# Patient Record
Sex: Female | Born: 1935 | Race: White | Hispanic: No | Marital: Married | Smoking: Never smoker
Health system: Southern US, Community
[De-identification: ages and names within clinical notes are randomized; demographics above are authoritative.]

## PROBLEM LIST (undated history)

## (undated) DIAGNOSIS — D649 Anemia, unspecified: Secondary | ICD-10-CM

## (undated) DIAGNOSIS — N1330 Unspecified hydronephrosis: Secondary | ICD-10-CM

## (undated) DIAGNOSIS — E119 Type 2 diabetes mellitus without complications: Secondary | ICD-10-CM

## (undated) DIAGNOSIS — K219 Gastro-esophageal reflux disease without esophagitis: Secondary | ICD-10-CM

## (undated) DIAGNOSIS — I10 Essential (primary) hypertension: Secondary | ICD-10-CM

## (undated) DIAGNOSIS — M1990 Unspecified osteoarthritis, unspecified site: Secondary | ICD-10-CM

## (undated) DIAGNOSIS — I5032 Chronic diastolic (congestive) heart failure: Secondary | ICD-10-CM

## (undated) DIAGNOSIS — N390 Urinary tract infection, site not specified: Secondary | ICD-10-CM

## (undated) DIAGNOSIS — I4891 Unspecified atrial fibrillation: Secondary | ICD-10-CM

## (undated) DIAGNOSIS — H3530 Unspecified macular degeneration: Secondary | ICD-10-CM

## (undated) DIAGNOSIS — L89109 Pressure ulcer of unspecified part of back, unspecified stage: Secondary | ICD-10-CM

## (undated) DIAGNOSIS — G4700 Insomnia, unspecified: Secondary | ICD-10-CM

## (undated) DIAGNOSIS — R609 Edema, unspecified: Secondary | ICD-10-CM

## (undated) DIAGNOSIS — N183 Chronic kidney disease, stage 3 unspecified: Secondary | ICD-10-CM

## (undated) DIAGNOSIS — F32A Depression, unspecified: Secondary | ICD-10-CM

## (undated) DIAGNOSIS — F329 Major depressive disorder, single episode, unspecified: Secondary | ICD-10-CM

## (undated) HISTORY — PX: COLONOSCOPY: SHX174

## (undated) HISTORY — DX: Unspecified osteoarthritis, unspecified site: M19.90

## (undated) HISTORY — PX: APPENDECTOMY: SHX54

## (undated) HISTORY — DX: Anemia, unspecified: D64.9

## (undated) HISTORY — DX: Edema, unspecified: R60.9

## (undated) HISTORY — DX: Unspecified hydronephrosis: N13.30

## (undated) HISTORY — PX: UPPER GASTROINTESTINAL ENDOSCOPY: SHX188

## (undated) HISTORY — PX: CHOLECYSTECTOMY: SHX55

## (undated) HISTORY — DX: Unspecified atrial fibrillation: I48.91

## (undated) HISTORY — DX: Unspecified macular degeneration: H35.30

## (undated) HISTORY — DX: Essential (primary) hypertension: I10

## (undated) HISTORY — DX: Type 2 diabetes mellitus without complications: E11.9

---

## 1998-01-21 ENCOUNTER — Other Ambulatory Visit: Admission: RE | Admit: 1998-01-21 | Discharge: 1998-01-21 | Payer: Self-pay | Admitting: Obstetrics and Gynecology

## 2001-02-13 ENCOUNTER — Ambulatory Visit (HOSPITAL_COMMUNITY): Admission: RE | Admit: 2001-02-13 | Discharge: 2001-02-13 | Payer: Self-pay | Admitting: Cardiovascular Disease

## 2001-02-14 ENCOUNTER — Encounter: Payer: Self-pay | Admitting: Cardiovascular Disease

## 2003-07-19 ENCOUNTER — Other Ambulatory Visit: Admission: RE | Admit: 2003-07-19 | Discharge: 2003-07-19 | Payer: Self-pay | Admitting: Obstetrics and Gynecology

## 2003-12-01 ENCOUNTER — Emergency Department (HOSPITAL_COMMUNITY): Admission: EM | Admit: 2003-12-01 | Discharge: 2003-12-02 | Payer: Self-pay | Admitting: Emergency Medicine

## 2004-01-10 ENCOUNTER — Ambulatory Visit: Payer: Self-pay | Admitting: Cardiovascular Disease

## 2004-01-31 ENCOUNTER — Ambulatory Visit: Payer: Self-pay | Admitting: Internal Medicine

## 2004-02-27 ENCOUNTER — Ambulatory Visit: Payer: Self-pay | Admitting: Cardiology

## 2004-03-27 ENCOUNTER — Ambulatory Visit: Payer: Self-pay | Admitting: Cardiovascular Disease

## 2004-04-24 ENCOUNTER — Ambulatory Visit: Payer: Self-pay | Admitting: Internal Medicine

## 2004-05-29 ENCOUNTER — Ambulatory Visit: Payer: Self-pay | Admitting: Internal Medicine

## 2004-06-26 ENCOUNTER — Ambulatory Visit: Payer: Self-pay | Admitting: Cardiology

## 2004-07-24 ENCOUNTER — Ambulatory Visit: Payer: Self-pay | Admitting: Cardiovascular Disease

## 2004-08-21 ENCOUNTER — Ambulatory Visit: Payer: Self-pay | Admitting: Cardiology

## 2004-09-18 ENCOUNTER — Ambulatory Visit: Payer: Self-pay | Admitting: Cardiology

## 2004-10-16 ENCOUNTER — Ambulatory Visit: Payer: Self-pay | Admitting: Cardiology

## 2004-10-26 ENCOUNTER — Other Ambulatory Visit: Admission: RE | Admit: 2004-10-26 | Discharge: 2004-10-26 | Payer: Self-pay | Admitting: Obstetrics and Gynecology

## 2004-11-06 ENCOUNTER — Ambulatory Visit (HOSPITAL_COMMUNITY): Admission: RE | Admit: 2004-11-06 | Discharge: 2004-11-06 | Payer: Self-pay | Admitting: Obstetrics and Gynecology

## 2004-11-13 ENCOUNTER — Ambulatory Visit: Payer: Self-pay | Admitting: Internal Medicine

## 2004-11-27 ENCOUNTER — Ambulatory Visit: Payer: Self-pay | Admitting: Cardiology

## 2004-12-25 ENCOUNTER — Ambulatory Visit: Payer: Self-pay | Admitting: Cardiovascular Disease

## 2005-01-22 ENCOUNTER — Ambulatory Visit: Payer: Self-pay | Admitting: Cardiology

## 2005-02-12 ENCOUNTER — Ambulatory Visit: Payer: Self-pay | Admitting: Cardiology

## 2005-03-12 ENCOUNTER — Ambulatory Visit: Payer: Self-pay | Admitting: Cardiology

## 2005-04-09 ENCOUNTER — Ambulatory Visit: Payer: Self-pay | Admitting: Cardiology

## 2005-05-07 ENCOUNTER — Ambulatory Visit: Payer: Self-pay | Admitting: Internal Medicine

## 2005-06-04 ENCOUNTER — Ambulatory Visit: Payer: Self-pay | Admitting: Cardiovascular Disease

## 2005-06-18 ENCOUNTER — Ambulatory Visit: Payer: Self-pay | Admitting: Cardiology

## 2005-07-09 ENCOUNTER — Ambulatory Visit: Payer: Self-pay | Admitting: Cardiology

## 2005-08-06 ENCOUNTER — Ambulatory Visit: Payer: Self-pay | Admitting: Cardiology

## 2005-09-03 ENCOUNTER — Ambulatory Visit: Payer: Self-pay | Admitting: Cardiology

## 2005-10-01 ENCOUNTER — Ambulatory Visit: Payer: Self-pay | Admitting: Cardiology

## 2005-10-29 ENCOUNTER — Ambulatory Visit: Payer: Self-pay | Admitting: Cardiovascular Disease

## 2005-11-26 ENCOUNTER — Ambulatory Visit: Payer: Self-pay | Admitting: Cardiovascular Disease

## 2005-12-24 ENCOUNTER — Ambulatory Visit: Payer: Self-pay | Admitting: Cardiology

## 2006-01-14 ENCOUNTER — Ambulatory Visit: Payer: Self-pay | Admitting: Cardiology

## 2006-02-04 ENCOUNTER — Ambulatory Visit: Payer: Self-pay | Admitting: Cardiology

## 2006-02-25 ENCOUNTER — Ambulatory Visit: Payer: Self-pay | Admitting: Cardiovascular Disease

## 2006-03-25 ENCOUNTER — Ambulatory Visit: Payer: Self-pay | Admitting: Cardiovascular Disease

## 2006-04-08 ENCOUNTER — Ambulatory Visit: Payer: Self-pay | Admitting: Internal Medicine

## 2006-04-29 ENCOUNTER — Ambulatory Visit: Payer: Self-pay | Admitting: Internal Medicine

## 2006-05-13 ENCOUNTER — Ambulatory Visit: Payer: Self-pay | Admitting: Cardiology

## 2006-05-20 ENCOUNTER — Ambulatory Visit: Payer: Self-pay | Admitting: Cardiovascular Disease

## 2006-05-30 ENCOUNTER — Ambulatory Visit: Payer: Self-pay | Admitting: Cardiology

## 2006-06-10 ENCOUNTER — Ambulatory Visit: Payer: Self-pay | Admitting: Cardiology

## 2006-06-25 ENCOUNTER — Emergency Department (HOSPITAL_COMMUNITY): Admission: EM | Admit: 2006-06-25 | Discharge: 2006-06-25 | Payer: Self-pay | Admitting: Emergency Medicine

## 2006-07-01 ENCOUNTER — Ambulatory Visit: Payer: Self-pay | Admitting: Cardiology

## 2006-07-15 ENCOUNTER — Ambulatory Visit: Payer: Self-pay | Admitting: Cardiology

## 2006-08-12 ENCOUNTER — Ambulatory Visit: Payer: Self-pay | Admitting: Cardiology

## 2006-08-26 ENCOUNTER — Ambulatory Visit: Payer: Self-pay | Admitting: Cardiovascular Disease

## 2006-09-08 ENCOUNTER — Ambulatory Visit: Payer: Self-pay | Admitting: Internal Medicine

## 2006-09-30 ENCOUNTER — Ambulatory Visit: Payer: Self-pay | Admitting: Cardiology

## 2006-10-14 ENCOUNTER — Ambulatory Visit: Payer: Self-pay | Admitting: Cardiology

## 2006-10-28 ENCOUNTER — Ambulatory Visit: Payer: Self-pay | Admitting: Cardiovascular Disease

## 2006-11-11 ENCOUNTER — Ambulatory Visit: Payer: Self-pay | Admitting: Cardiology

## 2006-11-25 ENCOUNTER — Ambulatory Visit: Payer: Self-pay | Admitting: Cardiology

## 2006-12-16 ENCOUNTER — Ambulatory Visit: Payer: Self-pay | Admitting: Cardiology

## 2007-01-20 ENCOUNTER — Ambulatory Visit: Payer: Self-pay | Admitting: Cardiology

## 2007-02-17 ENCOUNTER — Ambulatory Visit: Payer: Self-pay | Admitting: Cardiology

## 2007-02-28 ENCOUNTER — Ambulatory Visit: Payer: Self-pay | Admitting: Cardiology

## 2007-03-24 ENCOUNTER — Ambulatory Visit: Payer: Self-pay | Admitting: Cardiology

## 2007-03-31 ENCOUNTER — Ambulatory Visit: Payer: Self-pay | Admitting: Cardiovascular Disease

## 2007-04-21 ENCOUNTER — Encounter: Payer: Self-pay | Admitting: Cardiovascular Disease

## 2007-04-21 ENCOUNTER — Ambulatory Visit: Payer: Self-pay

## 2007-04-21 ENCOUNTER — Ambulatory Visit: Payer: Self-pay | Admitting: Internal Medicine

## 2007-05-19 ENCOUNTER — Ambulatory Visit: Payer: Self-pay | Admitting: Internal Medicine

## 2007-06-15 ENCOUNTER — Ambulatory Visit: Payer: Self-pay | Admitting: Internal Medicine

## 2007-07-14 ENCOUNTER — Ambulatory Visit: Payer: Self-pay | Admitting: Cardiology

## 2007-08-04 ENCOUNTER — Ambulatory Visit: Payer: Self-pay | Admitting: Cardiology

## 2007-08-07 HISTORY — PX: REPLACEMENT TOTAL KNEE: SUR1224

## 2007-08-16 ENCOUNTER — Inpatient Hospital Stay (HOSPITAL_COMMUNITY): Admission: RE | Admit: 2007-08-16 | Discharge: 2007-08-22 | Payer: Self-pay | Admitting: Orthopedic Surgery

## 2007-09-19 ENCOUNTER — Encounter: Admission: RE | Admit: 2007-09-19 | Discharge: 2007-09-19 | Payer: Self-pay | Admitting: Endocrinology

## 2007-11-16 ENCOUNTER — Ambulatory Visit: Payer: Self-pay | Admitting: Cardiovascular Disease

## 2007-11-24 ENCOUNTER — Ambulatory Visit: Payer: Self-pay | Admitting: Cardiology

## 2007-12-22 ENCOUNTER — Ambulatory Visit: Payer: Self-pay | Admitting: Cardiology

## 2008-01-19 ENCOUNTER — Ambulatory Visit: Payer: Self-pay | Admitting: Internal Medicine

## 2008-01-29 ENCOUNTER — Ambulatory Visit: Payer: Self-pay | Admitting: Internal Medicine

## 2008-02-09 ENCOUNTER — Ambulatory Visit: Payer: Self-pay | Admitting: Internal Medicine

## 2008-02-23 ENCOUNTER — Ambulatory Visit: Payer: Self-pay | Admitting: Cardiovascular Disease

## 2008-02-29 ENCOUNTER — Ambulatory Visit: Payer: Self-pay | Admitting: Internal Medicine

## 2008-03-07 ENCOUNTER — Ambulatory Visit: Payer: Self-pay | Admitting: Internal Medicine

## 2008-03-22 ENCOUNTER — Ambulatory Visit: Payer: Self-pay | Admitting: Cardiology

## 2008-04-05 ENCOUNTER — Ambulatory Visit: Payer: Self-pay | Admitting: Cardiovascular Disease

## 2008-04-15 ENCOUNTER — Inpatient Hospital Stay (HOSPITAL_COMMUNITY): Admission: RE | Admit: 2008-04-15 | Discharge: 2008-04-18 | Payer: Self-pay | Admitting: Orthopedic Surgery

## 2008-04-25 ENCOUNTER — Encounter: Admission: RE | Admit: 2008-04-25 | Discharge: 2008-04-25 | Payer: Self-pay | Admitting: Endocrinology

## 2008-06-12 DIAGNOSIS — I4891 Unspecified atrial fibrillation: Secondary | ICD-10-CM | POA: Insufficient documentation

## 2008-06-12 DIAGNOSIS — I10 Essential (primary) hypertension: Secondary | ICD-10-CM

## 2008-06-12 DIAGNOSIS — H3530 Unspecified macular degeneration: Secondary | ICD-10-CM

## 2008-06-12 DIAGNOSIS — E7800 Pure hypercholesterolemia, unspecified: Secondary | ICD-10-CM

## 2008-06-12 DIAGNOSIS — M1990 Unspecified osteoarthritis, unspecified site: Secondary | ICD-10-CM | POA: Insufficient documentation

## 2008-06-12 DIAGNOSIS — M810 Age-related osteoporosis without current pathological fracture: Secondary | ICD-10-CM | POA: Insufficient documentation

## 2008-06-14 ENCOUNTER — Ambulatory Visit: Payer: Self-pay | Admitting: Cardiovascular Disease

## 2008-06-14 ENCOUNTER — Encounter: Payer: Self-pay | Admitting: Cardiovascular Disease

## 2008-06-14 DIAGNOSIS — R609 Edema, unspecified: Secondary | ICD-10-CM

## 2008-06-21 ENCOUNTER — Ambulatory Visit: Payer: Self-pay | Admitting: Internal Medicine

## 2008-07-05 ENCOUNTER — Ambulatory Visit: Payer: Self-pay | Admitting: Cardiology

## 2008-07-19 ENCOUNTER — Ambulatory Visit: Payer: Self-pay | Admitting: Cardiology

## 2008-07-29 ENCOUNTER — Ambulatory Visit: Payer: Self-pay | Admitting: Cardiovascular Disease

## 2008-08-06 ENCOUNTER — Encounter: Payer: Self-pay | Admitting: *Deleted

## 2008-08-09 ENCOUNTER — Ambulatory Visit: Payer: Self-pay | Admitting: Cardiology

## 2008-08-30 ENCOUNTER — Ambulatory Visit: Payer: Self-pay | Admitting: Cardiology

## 2008-08-30 LAB — CONVERTED CEMR LAB: POC INR: 2.5

## 2008-09-11 ENCOUNTER — Encounter: Payer: Self-pay | Admitting: *Deleted

## 2008-09-27 ENCOUNTER — Ambulatory Visit: Payer: Self-pay | Admitting: Internal Medicine

## 2008-09-27 LAB — CONVERTED CEMR LAB: Prothrombin Time: 21.6 s

## 2008-10-11 ENCOUNTER — Ambulatory Visit: Payer: Self-pay | Admitting: Cardiology

## 2008-10-11 LAB — CONVERTED CEMR LAB
POC INR: 2.2
Prothrombin Time: 18.1 s

## 2008-11-01 ENCOUNTER — Encounter (INDEPENDENT_AMBULATORY_CARE_PROVIDER_SITE_OTHER): Payer: Self-pay | Admitting: Cardiology

## 2008-11-01 ENCOUNTER — Ambulatory Visit: Payer: Self-pay | Admitting: Cardiovascular Disease

## 2008-11-29 ENCOUNTER — Ambulatory Visit: Payer: Self-pay | Admitting: Internal Medicine

## 2008-11-29 LAB — CONVERTED CEMR LAB: POC INR: 5

## 2008-12-06 ENCOUNTER — Ambulatory Visit: Payer: Self-pay | Admitting: Internal Medicine

## 2008-12-20 ENCOUNTER — Ambulatory Visit: Payer: Self-pay | Admitting: Cardiovascular Disease

## 2008-12-20 ENCOUNTER — Ambulatory Visit: Payer: Self-pay | Admitting: Cardiology

## 2008-12-26 ENCOUNTER — Ambulatory Visit: Payer: Self-pay | Admitting: Cardiovascular Disease

## 2008-12-30 ENCOUNTER — Telehealth: Payer: Self-pay | Admitting: Cardiovascular Disease

## 2009-01-10 ENCOUNTER — Ambulatory Visit: Payer: Self-pay | Admitting: Cardiovascular Disease

## 2009-01-10 LAB — CONVERTED CEMR LAB: POC INR: 2.2

## 2009-01-24 ENCOUNTER — Ambulatory Visit: Payer: Self-pay | Admitting: Cardiology

## 2009-01-24 LAB — CONVERTED CEMR LAB: POC INR: 2.9

## 2009-02-21 ENCOUNTER — Ambulatory Visit: Payer: Self-pay | Admitting: Cardiovascular Disease

## 2009-02-21 LAB — CONVERTED CEMR LAB: INR: 2.5

## 2009-03-20 ENCOUNTER — Ambulatory Visit: Payer: Self-pay | Admitting: Internal Medicine

## 2009-03-20 LAB — CONVERTED CEMR LAB: POC INR: 3.3

## 2009-04-18 ENCOUNTER — Ambulatory Visit: Payer: Self-pay | Admitting: Internal Medicine

## 2009-05-16 ENCOUNTER — Ambulatory Visit: Payer: Self-pay | Admitting: Cardiology

## 2009-05-30 ENCOUNTER — Ambulatory Visit: Payer: Self-pay | Admitting: Internal Medicine

## 2009-05-30 LAB — CONVERTED CEMR LAB: POC INR: 2.4

## 2009-06-13 ENCOUNTER — Ambulatory Visit: Payer: Self-pay | Admitting: Cardiology

## 2009-07-04 ENCOUNTER — Ambulatory Visit: Payer: Self-pay | Admitting: Cardiology

## 2009-07-22 ENCOUNTER — Ambulatory Visit: Payer: Self-pay | Admitting: Internal Medicine

## 2009-08-06 ENCOUNTER — Ambulatory Visit: Payer: Self-pay | Admitting: Cardiology

## 2009-08-06 ENCOUNTER — Ambulatory Visit: Payer: Self-pay | Admitting: Cardiovascular Disease

## 2009-08-06 LAB — CONVERTED CEMR LAB: POC INR: 2.8

## 2009-08-27 ENCOUNTER — Ambulatory Visit: Payer: Self-pay | Admitting: Cardiovascular Disease

## 2009-08-27 LAB — CONVERTED CEMR LAB: POC INR: 4.8

## 2009-09-05 ENCOUNTER — Ambulatory Visit: Payer: Self-pay | Admitting: Cardiology

## 2009-09-19 ENCOUNTER — Ambulatory Visit: Payer: Self-pay | Admitting: Cardiology

## 2009-09-19 LAB — CONVERTED CEMR LAB: POC INR: 2.4

## 2009-10-17 ENCOUNTER — Ambulatory Visit: Payer: Self-pay | Admitting: Internal Medicine

## 2009-10-17 LAB — CONVERTED CEMR LAB: POC INR: 2.3

## 2009-11-13 ENCOUNTER — Ambulatory Visit: Payer: Self-pay | Admitting: Internal Medicine

## 2009-12-11 ENCOUNTER — Ambulatory Visit: Payer: Self-pay | Admitting: Internal Medicine

## 2010-01-08 ENCOUNTER — Ambulatory Visit: Payer: Self-pay

## 2010-02-05 ENCOUNTER — Encounter: Payer: Self-pay | Admitting: Cardiovascular Disease

## 2010-02-05 ENCOUNTER — Ambulatory Visit: Payer: Self-pay | Admitting: Cardiovascular Disease

## 2010-03-05 ENCOUNTER — Ambulatory Visit: Payer: Self-pay | Admitting: Cardiology

## 2010-04-02 ENCOUNTER — Ambulatory Visit: Admission: RE | Admit: 2010-04-02 | Discharge: 2010-04-02 | Payer: Self-pay | Source: Home / Self Care

## 2010-04-07 NOTE — Medication Information (Signed)
Summary: rov/tm   Anticoagulant Therapy  Managed by: Bethena Midget, RN, BSN Referring MD: Charlton Haws MD Supervising MD: Ladona Ridgel MD,Gregg Indication 1: Atrial Fibrillation (ICD-427.31) Lab Used: [facility]  Site: [facility] INR POC 2.3 INR RANGE 2 - 3  Dietary changes: no    Health status changes: no    Bleeding/hemorrhagic complications: no    Recent/future hospitalizations: no    Any changes in medication regimen? no    Recent/future dental: no  Any missed doses?: no       Is patient compliant with meds? yes       Allergies: No Known Drug Allergies  Anticoagulation Management History:      The patient is taking warfarin and comes in today for a routine follow up visit.  Positive risk factors for bleeding include an age of 48 years or older and presence of serious comorbidities.  The bleeding index is 'intermediate risk'.  Positive CHADS2 values include History of HTN and History of Diabetes.  Negative CHADS2 values include Age > 51 years old.  The start date was 01/06/2001.  Her last INR was 2.5.  Anticoagulation responsible provider: Ladona Ridgel MD,Gregg.  INR POC: 2.3.  Cuvette Lot#: [id number].  Exp: 12/2010.    Anticoagulation Management Assessment/Plan:      The patient's current anticoagulation dose is Coumadin 3 mg tabs: Take as directed by coumadin clinic..  The target INR is 2 - 3.  The next INR is due 12/11/2009.  Anticoagulation instructions were given to patient.  Results were reviewed/authorized by Bethena Midget, RN, BSN.         Prior Anticoagulation Instructions: INR 2.3 Continue 3mg s everyday. Recheck in 4 weeks.   Current Anticoagulation Instructions: INR 2.3  Continue taking 1 tablet everyday. No changes today. See you in 4 weeks.

## 2010-04-07 NOTE — Medication Information (Signed)
Summary: rov/tm  Anticoagulant Therapy  Managed by: Weston Brass, PharmD Referring MD: Charlton Haws MD Supervising MD: Eden Emms MD, Theron Arista Indication 1: Atrial Fibrillation (ICD-427.31) Lab Used: [facility] [place] Site: [facility] INR POC 4.8 INR RANGE 2 - 3  Dietary changes: no    Health status changes: no    Bleeding/hemorrhagic complications: no    Recent/future hospitalizations: no    Any changes in medication regimen? no    Recent/future dental: no  Any missed doses?: no       Is patient compliant with meds? yes       Allergies: No Known Drug Allergies  Anticoagulation Management History:      The patient is taking warfarin and comes in today for a routine follow up visit.  Positive risk factors for bleeding include an age of 75 years or older and presence of serious comorbidities.  The bleeding index is 'intermediate risk'.  Positive CHADS2 values include History of HTN and History of Diabetes.  Negative CHADS2 values include Age > 75 years old.  The start date was 01/06/2001.  Her last INR was 2.5.  Anticoagulation responsible provider: Eden Emms MD, Theron Arista.  INR POC: 4.8.  Cuvette Lot#: [id number].  Exp: 11/2010.    Anticoagulation Management Assessment/Plan:      The patient's current anticoagulation dose is Coumadin 3 mg tabs: Take as directed by coumadin clinic..  The target INR is 2 - 3.  The next INR is due 09/05/2009.  Anticoagulation instructions were given to patient.  Results were reviewed/authorized by Weston Brass, PharmD.  She was notified by Weston Brass PharmD.         Prior Anticoagulation Instructions: INR 2.8 Continue 3mg s daily except 6mg s on Mondays. Recheck in 3 weeks.   Current Anticoagulation Instructions: INR 4.8  Skip Coumadin today and tomorrow then decrease dose to 1 tablet every day.

## 2010-04-07 NOTE — Assessment & Plan Note (Signed)
Summary: F6M/DM      Allergies Added: NKDA  History of Present Illness: Kathryn [name] is seen today for F/U of H'TN, edema and chronic afib on anticoagulation.  She is willing to talk to the [employer] people today for our research trial .  She has now had both knees replaced and is getting around well.  She has mild chronic dyspnea and LE edema from her obesity and this is actually slightly improved.  She has not had any bleeding problems and no palpitations  Unfortunatley her husband had a spianl cord bleed from ? ?AV malformation and is paralyzed from the waste down.  She obviously is devastated by this.  He is still in rehab at [facility] and also has a neurogenic bladder.  Current Problems (verified): 1)  Coumadin Therapy  (ICD-V58.61) 2)  Edema  (ICD-782.3) 3)  Hypercholesterolemia Iia  (ICD-272.0) 4)  Osteoporosis  (ICD-733.00) 5)  Dm  (ICD-250.00) 6)  Hypertension, Unspecified  (ICD-401.9) 7)  Atrial Fibrillation  (ICD-427.31) 8)  Macular Degeneration  (ICD-362.50) 9)  Osteoarthritis  (ICD-715.90)  Current Medications (verified): 1)  Byetta 5 Mcg Pen  Soln (Exenatide) .... Two Times A Day 2)  Meloxicam 15 Mg Tabs (Meloxicam) .Marland Kitchen.. 1 Tab By Mouth Once Daily 3)  Gabapentin 300 Mg Caps (Gabapentin) .... Two Times A Day 4)  Amlodipine/benazepril 5/20 .Marland Kitchen.. 1 Tab By Mouth Once Daily 5)  Bumetanide 1 Mg Tabs (Bumetanide) .... Once Daily 6)  Aspirin 81 Mg Tbec (Aspirin) .... Take One Tablet By Mouth Daily 7)  Multivitamins   Tabs (Multiple Vitamin) .... Once Daily 8)  Calcium Carbonate-Vitamin D 600-400 Mg-Unit  Tabs (Calcium Carbonate-Vitamin D) .... Two Times A Day 9)  Cozaar 50 Mg Tabs (Losartan Potassium) .... Take One Tablet By Mouth Daily 10)  Humulin 70/30 70-30 % Susp (Insulin Isophane & Regular) .... As Directed 11)  Ferrous Sulfate 325 (65 Fe) Mg Tbec (Ferrous Sulfate) .... Once Daily 12)  Zocor 40 Mg Tabs (Simvastatin) .Marland Kitchen.. 1 Tab By Mouth Once Daily 13)  Coumadin 3 Mg Tabs (Warfarin Sodium)  .... Take As Directed By Coumadin Clinic. 14)  Vesicare 5 Mg Tabs (Solifenacin Succinate) .... Take 1 Tablet Daily  Allergies (verified): No Known Drug Allergies  Past History:  Past Medical History: Last updated: 06/12/2008 HYPERCHOLESTEROLEMIA  IIA (ICD-272.0) OSTEOPOROSIS (ICD-733.00) DM (ICD-250.00) HYPERTENSION, UNSPECIFIED (ICD-401.9) ATRIAL FIBRILLATION (ICD-427.31) MACULAR DEGENERATION (ICD-362.50) OSTEOARTHRITIS (ICD-715.90)    Past Surgical History: Last updated: 06/12/2008 Right total knee June of 2009 appendectomy  cholecystectomy  Family History: Last updated: 12/20/2008 non-contributory  Social History: Last updated: 06/12/2008 Retired  Married  Tobacco Use - No.  Alcohol Use - no Drug Use - no  Review of Systems       Denies fever, malais, weight loss, blurry vision, decreased visual acuity, cough, sputum, SOB, hemoptysis, pleuritic pain, palpitaitons, heartburn, abdominal pain, melena,  claudication, or rash.   Vital Signs:  Patient profile:   77 year old female Height:      70 inches Weight:      265 pounds BMI:     38.16 Pulse rate:   76 / minute Resp:     14 per minute BP sitting:   138 / 78  (left arm)  Vitals Entered By: Kem Parkinson (August 06, 2009 3:40 PM)  Physical Exam  General:  Affect appropriate Healthy:  appears stated age HEENT: normal Neck supple with no adenopathy JVP normal no bruits no thyromegaly Lungs clear with no wheezing and good diaphragmatic  motion Heart:  S1/S2 no murmur,rub, gallop or click PMI normal Abdomen: benighn, BS positve, no tenderness, no AAA no bruit.  No HSM or HJR Distal pulses intact with no bruits Plus 2 bilateral  edema Neuro non-focal Skin warm and dry    Impression & Recommendations:  Problem # 1:  COUMADIN THERAPY (ICD-V58.61) INR's been Rx.  No bleding problems  Problem # 2:  EDEMA (ICD-782.3) Continue diuretic at baseline  Elevate legs at end of day and limit  salt  Problem # 3:  HYPERCHOLESTEROLEMIA  IIA (ICD-272.0) Continue statin.  Labs in 6 months Her updated medication list for this problem includes:    Zocor 40 Mg Tabs (Simvastatin) .Marland Kitchen... 1 tab by mouth once daily  Problem # 4:  HYPERTENSION, UNSPECIFIED (ICD-401.9) Well controlled Her updated medication list for this problem includes:    Bumetanide 1 Mg Tabs (Bumetanide) ..... Once daily    Aspirin 81 Mg Tbec (Aspirin) .Marland Kitchen... Take one tablet by mouth daily    Cozaar 50 Mg Tabs (Losartan potassium) .Marland Kitchen... Take one tablet by mouth daily  Problem # 5:  ATRIAL FIBRILLATION (ICD-427.31) Good rate control and anticoagulation  Asymptomatic.  Failed multiple attempts at [facility] Her updated medication list for this problem includes:    Aspirin 81 Mg Tbec (Aspirin) .Marland Kitchen... Take one tablet by mouth daily    Coumadin 3 Mg Tabs (Warfarin sodium) .Marland Kitchen... Take as directed by coumadin clinic.  Patient Instructions: 1)  Your physician recommends that you schedule a follow-up appointment in: 6 MONTHS

## 2010-04-07 NOTE — Miscellaneous (Signed)
  Clinical Lists Changes  Medications: Removed medication of DETROL LA 4 MG [id number] (TOLTERODINE TARTRATE) once daily Added new medication of VESICARE 5 MG TABS (SOLIFENACIN SUCCINATE) Take 1 tablet daily

## 2010-04-07 NOTE — Medication Information (Signed)
Summary: rov/tm  Anticoagulant Therapy  Managed by: Bethena Midget, RN, BSN Referring MD: Charlton Haws MD Supervising MD: Tenny Craw MD, Gunnar Fusi Indication 1: Atrial Fibrillation (ICD-427.31) Lab Used: [facility] [place] Site: [facility] INR POC 2.4 INR RANGE 2 - 3  Dietary changes: no    Health status changes: no    Bleeding/hemorrhagic complications: yes       Details: Hematuria - Dx with UTI  Recent/future hospitalizations: no    Any changes in medication regimen? yes       Details: now on Nitrofudantin since 05/26/09  Recent/future dental: no  Any missed doses?: no       Is patient compliant with meds? yes       Allergies: No Known Drug Allergies  Anticoagulation Management History:      The patient is taking warfarin and comes in today for a routine follow up visit.  Positive risk factors for bleeding include an age of 75 years or older and presence of serious comorbidities.  The bleeding index is 'intermediate risk'.  Positive CHADS2 values include History of HTN and History of Diabetes.  Negative CHADS2 values include Age > 25 years old.  The start date was 01/06/2001.  Her last INR was 2.5.  Anticoagulation responsible provider: Tenny Craw MD, Gunnar Fusi.  INR POC: 2.4.  Cuvette Lot#: [id number].  Exp: 07/2010.    Anticoagulation Management Assessment/Plan:      The patient's current anticoagulation dose is Coumadin 3 mg tabs: Take as directed by coumadin clinic..  The target INR is 2 - 3.  The next INR is due 06/13/2009.  Anticoagulation instructions were given to patient.  Results were reviewed/authorized by Bethena Midget, RN, BSN.  She was notified by Bethena Midget, RN, BSN.         Prior Anticoagulation Instructions: INR 4.0 Skip today's dose. Then resume 3mg s daily except 6mg s on Mondays and Fridays. Recheck in 2 weeks.   Current Anticoagulation Instructions: INR 2.4 Continue 3mg s daily exept 6mg s on Mondays and Fridays. Recheck in 2 weeks.

## 2010-04-07 NOTE — Assessment & Plan Note (Signed)
Summary: F6M/DM      Allergies Added: NKDA  CC:  check up.  History of Present Illness: Kathryn [name] is seen today for F/U of H'TN, edema and chronic afib on anticoagulation. .  She has now had both knees replaced and is getting around well.  She has mild chronic dyspnea and LE edema from her obesity and this is actually slightly improved.  She has not had any bleeding problems and no palpitations  Unfortunatley her husband had a spianl cord bleed from ? ?AV malformation and is paralyzed from the waste down.  She obviously is devastated by this.  He is still in rehab at [facility] [place] and also has a neurogenic bladder. Recent admission to [facility] for sepsis as well  Current Problems (verified): 1)  Coumadin Therapy  (ICD-V58.61) 2)  Edema  (ICD-782.3) 3)  Hypercholesterolemia Iia  (ICD-272.0) 4)  Osteoporosis  (ICD-733.00) 5)  Dm  (ICD-250.00) 6)  Hypertension, Unspecified  (ICD-401.9) 7)  Atrial Fibrillation  (ICD-427.31) 8)  Macular Degeneration  (ICD-362.50) 9)  Osteoarthritis  (ICD-715.90)  Current Medications (verified): 1)  Byetta 5 Mcg Pen  Soln (Exenatide) .... Two Times A Day 2)  Meloxicam 15 Mg Tabs (Meloxicam) .Marland Kitchen.. 1 Tab By Mouth Once Daily 3)  Gabapentin 300 Mg Caps (Gabapentin) .... Two Times A Day 4)  Amlodipine/benazepril 5/20 .Marland Kitchen.. 1 Tab By Mouth Once Daily 5)  Bumetanide 1 Mg Tabs (Bumetanide) .... Once Daily 6)  Aspirin 81 Mg Tbec (Aspirin) .... Take One Tablet By Mouth Daily 7)  Multivitamins   Tabs (Multiple Vitamin) .... Once Daily 8)  Calcium Carbonate-Vitamin D 600-400 Mg-Unit  Tabs (Calcium Carbonate-Vitamin D) .... Two Times A Day 9)  Cozaar 50 Mg Tabs (Losartan Potassium) .... Take One Tablet By Mouth Daily 10)  Humulin 70/30 70-30 % Susp (Insulin Isophane & Regular) .... As Directed 11)  Ferrous Sulfate 325 (65 Fe) Mg Tbec (Ferrous Sulfate) .... Once Daily 12)  Zocor 40 Mg Tabs (Simvastatin) .Marland Kitchen.. 1 Tab By Mouth Once Daily 13)  Coumadin 3 Mg Tabs (Warfarin Sodium) .... Take  As Directed By Coumadin Clinic. 14)  Vesicare 5 Mg Tabs (Solifenacin Succinate) .... Take 1 Tablet Daily  Allergies (verified): No Known Drug Allergies  Past History:  Past Medical History: Last updated: 06/12/2008 HYPERCHOLESTEROLEMIA  IIA (ICD-272.0) OSTEOPOROSIS (ICD-733.00) DM (ICD-250.00) HYPERTENSION, UNSPECIFIED (ICD-401.9) ATRIAL FIBRILLATION (ICD-427.31) MACULAR DEGENERATION (ICD-362.50) OSTEOARTHRITIS (ICD-715.90)    Past Surgical History: Last updated: 06/12/2008 Right total knee June of 2009 appendectomy  cholecystectomy  Family History: Last updated: 12/20/2008 non-contributory  Social History: Last updated: 06/12/2008 Retired  Married  Tobacco Use - No.  Alcohol Use - no Drug Use - no  Review of Systems       Denies fever, malais, weight loss, blurry vision, decreased visual acuity, cough, sputum, SOB, hemoptysis, pleuritic pain, palpitaitons, heartburn, abdominal pain, melena, lower extremity edema, claudication, or rash.   Vital Signs:  Patient profile:   77 year old female Height:      70 inches Weight:      283 pounds BMI:     40.75 Pulse rate:   84 / minute Resp:     14 per minute BP sitting:   143 / 80  (left arm)  Vitals Entered By: Kem Parkinson (February 05, 2010 4:08 PM)  Physical Exam  General:  Affect appropriate Healthy:  appears stated age HEENT: normal Neck supple with no adenopathy JVP normal no bruits no thyromegaly Lungs clear with no wheezing and good diaphragmatic  motion Heart:  S1/S2 no murmur,rub, gallop or click PMI normal Abdomen: benighn, BS positve, no tenderness, no AAA no bruit.  No HSM or HJR Distal pulses intact with no bruits No edema Neuro non-focal Skin warm and dry    Impression & Recommendations:  Problem # 1:  COUMADIN THERAPY (ICD-V58.61) Rx with no bleeding complications.    Problem # 2:  EDEMA (ICD-782.3) Stable  continue current dose of diuretic  Problem # 3:  HYPERTENSION,  UNSPECIFIED (ICD-401.9) Well controlled Will call if copay goes up on her med and we can change to generic Her updated medication list for this problem includes:    Bumetanide 1 Mg Tabs (Bumetanide) ..... Once daily    Aspirin 81 Mg Tbec (Aspirin) .Marland Kitchen... Take one tablet by mouth daily    Cozaar 50 Mg Tabs (Losartan potassium) .Marland Kitchen... Take one tablet by mouth daily  Problem # 4:  ATRIAL FIBRILLATION (ICD-427.31) Good rate control and anticoagulation Her updated medication list for this problem includes:    Aspirin 81 Mg Tbec (Aspirin) .Marland Kitchen... Take one tablet by mouth daily    Coumadin 3 Mg Tabs (Warfarin sodium) .Marland Kitchen... Take as directed by coumadin clinic.  Patient Instructions: 1)  Your physician wants you to follow-up in: 6 MONTHS  You will receive a reminder letter in the mail two months in advance. If you don't receive a letter, please call our office to schedule the follow-up appointment.

## 2010-04-07 NOTE — Medication Information (Signed)
Summary: rov/eac  Anticoagulant Therapy  Managed by: Bethena Midget, RN, BSN Referring MD: Charlton Haws MD Supervising MD: Daleen Squibb MD, Maisie Fus Indication 1: Atrial Fibrillation (ICD-427.31) Lab Used: [facility] [place] Site: [facility] INR POC 2.3 INR RANGE 2 - 3  Dietary changes: no    Health status changes: no    Bleeding/hemorrhagic complications: no    Recent/future hospitalizations: no    Any changes in medication regimen? no    Recent/future dental: no  Any missed doses?: no       Is patient compliant with meds? yes       Allergies: No Known Drug Allergies  Anticoagulation Management History:      The patient is taking warfarin and comes in today for a routine follow up visit.  Positive risk factors for bleeding include an age of 75 years or older and presence of serious comorbidities.  The bleeding index is 'intermediate risk'.  Positive CHADS2 values include History of HTN and History of Diabetes.  Negative CHADS2 values include Age > 61 years old.  The start date was 01/06/2001.  Her last INR was 2.5.  Anticoagulation responsible provider: Daleen Squibb MD, Maisie Fus.  INR POC: 2.3.  Cuvette Lot#: [id number].  Exp: 02/2011.    Anticoagulation Management Assessment/Plan:      The patient's current anticoagulation dose is Coumadin 3 mg tabs: Take as directed by coumadin clinic..  The target INR is 2 - 3.  The next INR is due 02/06/2010.  Anticoagulation instructions were given to patient.  Results were reviewed/authorized by Bethena Midget, RN, BSN.  She was notified by Bethena Midget, RN, BSN.         Prior Anticoagulation Instructions: INR 1.9  Take an extra 1/2 tablet today.  Then resume normal dosing schedule of 1 tablet daily.  Return to clinic in 4 weeks.   Current Anticoagulation Instructions: INR 2.3 Continue 3mg s everyday. Recheck in 4 weeks.

## 2010-04-07 NOTE — Medication Information (Signed)
Summary: rov/sp  Anticoagulant Therapy  Managed by: Bethena Midget, RN, BSN Referring MD: Charlton Haws MD Supervising MD: Tenny Craw MD, Gunnar Fusi Indication 1: Atrial Fibrillation (ICD-427.31) Lab Used: [facility] [place] Site: [facility] INR POC 2.3 INR RANGE 2 - 3  Dietary changes: no    Health status changes: no    Bleeding/hemorrhagic complications: no    Recent/future hospitalizations: no    Any changes in medication regimen? no    Recent/future dental: no  Any missed doses?: no       Is patient compliant with meds? yes       Allergies: No Known Drug Allergies  Anticoagulation Management History:      The patient is taking warfarin and comes in today for a routine follow up visit.  Positive risk factors for bleeding include an age of 75 years or older and presence of serious comorbidities.  The bleeding index is 'intermediate risk'.  Positive CHADS2 values include History of HTN and History of Diabetes.  Negative CHADS2 values include Age > 75 years old.  The start date was 01/06/2001.  Her last INR was 2.5.  Anticoagulation responsible provider: Tenny Craw MD, Gunnar Fusi.  INR POC: 2.3.  Cuvette Lot#: [id number].  Exp: 12/2010.    Anticoagulation Management Assessment/Plan:      The patient's current anticoagulation dose is Coumadin 3 mg tabs: Take as directed by coumadin clinic..  The target INR is 2 - 3.  The next INR is due 11/13/2009.  Anticoagulation instructions were given to patient.  Results were reviewed/authorized by Bethena Midget, RN, BSN.  She was notified by Bethena Midget, RN, BSN.         Prior Anticoagulation Instructions: INR 2.4  Continue same dose of 1 tablet every day.   Current Anticoagulation Instructions: INR 2.3 Continue 3mg s everyday. Recheck in 4 weeks.

## 2010-04-07 NOTE — Medication Information (Signed)
Summary: rov coumadin - [facility]  Anticoagulant Therapy  Managed by: Bethena Midget, RN, BSN Referring MD: Charlton Haws MD Supervising MD: Tenny Craw MD, Gunnar Fusi Indication 1: Atrial Fibrillation (ICD-427.31) Lab Used: [facility] [place] Site: [facility] INR POC 2.1 INR RANGE 2 - 3           Allergies: No Known Drug Allergies  Anticoagulation Management History:      The patient is taking warfarin and comes in today for a routine follow up visit.  Positive risk factors for bleeding include an age of 29 years or older and presence of serious comorbidities.  The bleeding index is 'intermediate risk'.  Positive CHADS2 values include History of HTN and History of Diabetes.  Negative CHADS2 values include Age > 14 years old.  The start date was 01/06/2001.  Her last INR was 2.5.  Anticoagulation responsible provider: Tenny Craw MD, Gunnar Fusi.  INR POC: 2.1.  Cuvette Lot#: [id number].  Exp: 07/06/2010.    Anticoagulation Management Assessment/Plan:      The patient's current anticoagulation dose is Coumadin 3 mg tabs: Take as directed by coumadin clinic..  The target INR is 2 - 3.  The next INR is due 05/16/2009.  Anticoagulation instructions were given to patient.  Results were reviewed/authorized by Bethena Midget, RN, BSN.  She was notified by Bethena Midget, RN, BSN.         Prior Anticoagulation Instructions: INR 3.3  Coumadin 2 tabs = 6mg  on Mon and Fri 1 tab = 3mg  all other days  Current Anticoagulation Instructions: INR 2.1 Continue 3mg s daily except 6mg s on Mondays and Fridays. Recheck in 4 weeks.    Appended Document: Coumadin Clinic    Anticoagulant Therapy  Managed by: Bethena Midget, RN, BSN Referring MD: Charlton Haws MD Supervising MD: Tenny Craw MD, Gunnar Fusi Indication 1: Atrial Fibrillation (ICD-427.31) Lab Used: [facility] [place] Site: [facility] INR RANGE 2 - 3  Dietary changes: no    Health status changes: no    Bleeding/hemorrhagic complications: no    Recent/future hospitalizations: no     Any changes in medication regimen? no    Recent/future dental: no  Any missed doses?: no       Is patient compliant with meds? yes       Allergies: No Known Drug Allergies  Anticoagulation Management History:      Positive risk factors for bleeding include an age of 73 years or older and presence of serious comorbidities.  The bleeding index is 'intermediate risk'.  Positive CHADS2 values include History of HTN and History of Diabetes.  Negative CHADS2 values include Age > 18 years old.  The start date was 01/06/2001.  Her last INR was 2.5.  Anticoagulation responsible provider: Tenny Craw MD, Gunnar Fusi.  Exp: 07/06/2010.    Anticoagulation Management Assessment/Plan:      The patient's current anticoagulation dose is Coumadin 3 mg tabs: Take as directed by coumadin clinic..  The target INR is 2 - 3.  The next INR is due 05/16/2009.  Anticoagulation instructions were given to patient.  Results were reviewed/authorized by Bethena Midget, RN, BSN.         Prior Anticoagulation Instructions: INR 2.1 Continue 3mg s daily except 6mg s on Mondays and Fridays. Recheck in 4 weeks.

## 2010-04-07 NOTE — Medication Information (Signed)
Summary: rov/sl   Anticoagulant Therapy  Managed by: Weston Brass, PharmD Referring MD: Charlton Haws MD Supervising MD: Eden Emms MD, Theron Arista Indication 1: Atrial Fibrillation (ICD-427.31) Lab Used: [facility] Site: [facility] INR POC 2.0 INR RANGE 2 - 3  Dietary changes: no    Health status changes: no    Bleeding/hemorrhagic complications: no    Recent/future hospitalizations: no    Any changes in medication regimen? no    Recent/future dental: no  Any missed doses?: no       Is patient compliant with meds? yes       Allergies: No Known Drug Allergies  Anticoagulation Management History:      The patient is taking warfarin and comes in today for a routine follow up visit.  Positive risk factors for bleeding include an age of 75 years or older and presence of serious comorbidities.  The bleeding index is 'intermediate risk'.  Positive CHADS2 values include History of HTN and History of Diabetes.  Negative CHADS2 values include Age > 8 years old.  The start date was 01/06/2001.  Her last INR was 2.5.  Anticoagulation responsible Adine Heimann: Eden Emms MD, Theron Arista.  INR POC: 2.0.  Cuvette Lot#: [id number].  Exp: 02/2011.    Anticoagulation Management Assessment/Plan:      The patient's current anticoagulation dose is Coumadin 3 mg tabs: Take as directed by coumadin clinic..  The target INR is 2 - 3.  The next INR is due 03/05/2010.  Anticoagulation instructions were given to patient.  Results were reviewed/authorized by Weston Brass, PharmD.  She was notified by Weston Brass PharmD.         Prior Anticoagulation Instructions: INR 2.3 Continue 3mg s everyday. Recheck in 4 weeks.   Current Anticoagulation Instructions: INR 2.0  Continue same dose of 1 tablet every day.  Recheck INR in 4 weeks.

## 2010-04-07 NOTE — Medication Information (Signed)
Summary: rov/mw  Anticoagulant Therapy  Managed by: Eda Keys, PharmD Referring MD: Charlton Haws MD Supervising MD: Johney Frame MD, Fayrene Fearing Indication 1: Atrial Fibrillation (ICD-427.31) Lab Used: [facility] Site: [facility] INR RANGE 2 - 3  Dietary changes: no    Health status changes: no    Bleeding/hemorrhagic complications: no    Recent/future hospitalizations: no    Any changes in medication regimen? yes       Details: Patient remains on antibiotic course for UTI, total will be a 60 day course, pt is about 20 days in.  Recent/future dental: no  Any missed doses?: no       Is patient compliant with meds? yes       Allergies: No Known Drug Allergies  Anticoagulation Management History:      The patient is taking warfarin and comes in today for a routine follow up visit.  Positive risk factors for bleeding include an age of 75 years or older and presence of serious comorbidities.  The bleeding index is 'intermediate risk'.  Positive CHADS2 values include History of HTN and History of Diabetes.  Negative CHADS2 values include Age > 75 years old.  The start date was 01/06/2001.  Her last INR was 2.5.  Anticoagulation responsible provider: Rikki Trosper MD, Fayrene Fearing.  Cuvette Lot#: [id number].  Exp: 01/2011.    Anticoagulation Management Assessment/Plan:      The patient's current anticoagulation dose is Coumadin 3 mg tabs: Take as directed by coumadin clinic..  The target INR is 2 - 3.  The next INR is due 01/08/2010.  Anticoagulation instructions were given to patient.  Results were reviewed/authorized by Eda Keys, PharmD.  She was notified by Eda Keys.         Prior Anticoagulation Instructions: INR 2.3  Continue taking 1 tablet everyday. No changes today. See you in 4 weeks.  Current Anticoagulation Instructions: INR 1.9  Take an extra 1/2 tablet today.  Then resume normal dosing schedule of 1 tablet daily.  Return to clinic in 4 weeks.

## 2010-04-07 NOTE — Medication Information (Signed)
Summary: rov/sp  Anticoagulant Therapy  Managed by: Bethena Midget, RN, BSN Referring MD: Charlton Haws MD Supervising MD: Juanda Chance MD, Maebell Lyvers Indication 1: Atrial Fibrillation (ICD-427.31) Lab Used: [facility] Site: [facility] INR POC 3.4 INR RANGE 2 - 3  Dietary changes: yes       Details: Ate less green leafy veggies  Health status changes: no    Bleeding/hemorrhagic complications: no    Recent/future hospitalizations: no    Any changes in medication regimen? no    Recent/future dental: no  Any missed doses?: no       Is patient compliant with meds? yes       Allergies: No Known Drug Allergies  Anticoagulation Management History:      The patient is taking warfarin and comes in today for a routine follow up visit.  Positive risk factors for bleeding include an age of 75 years or older and presence of serious comorbidities.  The bleeding index is 'intermediate risk'.  Positive CHADS2 values include History of HTN and History of Diabetes.  Negative CHADS2 values include Age > 75 years old.  The start date was 01/06/2001.  Her last INR was 2.5.  Anticoagulation responsible provider: Juanda Chance MD, Smitty Cords.  INR POC: 3.4.  Cuvette Lot#: [id number].  Exp: 08/2010.    Anticoagulation Management Assessment/Plan:      The patient's current anticoagulation dose is Coumadin 3 mg tabs: Take as directed by coumadin clinic..  The target INR is 2 - 3.  The next INR is due 07/18/2009.  Anticoagulation instructions were given to patient.  Results were reviewed/authorized by Bethena Midget, RN, BSN.  She was notified by Bethena Midget, RN, BSN.         Prior Anticoagulation Instructions: INR 3.1  Take 1 tablet today then resume same dose of 1 tablet every day except 2 tablets on Monday and Friday   Current Anticoagulation Instructions: INR 3.4 Today skip today then change dose to 3mg s daily except 6mg s on Mondays. Recheck in 2 weeks.

## 2010-04-07 NOTE — Medication Information (Signed)
Summary: rov/sp  Anticoagulant Therapy  Managed by: Cloyde Reams, RN, BSN Referring MD: Charlton Haws MD Supervising MD: Juanda Chance MD, Tramaine Sauls Indication 1: Atrial Fibrillation (ICD-427.31) Lab Used: [facility] Site: [facility] INR POC 2.7 INR RANGE 2 - 3  Dietary changes: no    Health status changes: no    Bleeding/hemorrhagic complications: no    Recent/future hospitalizations: no    Any changes in medication regimen? no    Recent/future dental: no  Any missed doses?: no       Is patient compliant with meds? yes       Allergies: No Known Drug Allergies  Anticoagulation Management History:      The patient is taking warfarin and comes in today for a routine follow up visit.  Positive risk factors for bleeding include an age of 75 years or older and presence of serious comorbidities.  The bleeding index is 'intermediate risk'.  Positive CHADS2 values include History of HTN and History of Diabetes.  Negative CHADS2 values include Age > 1 years old.  The start date was 01/06/2001.  Her last INR was 2.5.  Anticoagulation responsible provider: Juanda Chance MD, Smitty Cords.  INR POC: 2.7.  Cuvette Lot#: [id number].  Exp: 10/2010.    Anticoagulation Management Assessment/Plan:      The patient's current anticoagulation dose is Coumadin 3 mg tabs: Take as directed by coumadin clinic..  The target INR is 2 - 3.  The next INR is due 09/19/2009.  Anticoagulation instructions were given to patient.  Results were reviewed/authorized by Cloyde Reams, RN, BSN.  She was notified by Cloyde Reams RN.         Prior Anticoagulation Instructions: INR 4.8  Skip Coumadin today and tomorrow then decrease dose to 1 tablet every day.    Current Anticoagulation Instructions: INR 2.7  Continue on same dosage 3mg  tablets daily.  Recheck in 2 weeks.

## 2010-04-07 NOTE — Medication Information (Signed)
Summary: rov/sp  Anticoagulant Therapy  Managed by: Bethena Midget, RN, BSN Referring MD: Charlton Haws MD Supervising MD: Riley Kill MD, Maisie Fus Indication 1: Atrial Fibrillation (ICD-427.31) Lab Used: [facility] Site: [facility] INR POC 2.8 INR RANGE 2 - 3  Dietary changes: no    Health status changes: no    Bleeding/hemorrhagic complications: no    Recent/future hospitalizations: no    Any changes in medication regimen? no    Recent/future dental: no  Any missed doses?: no       Is patient compliant with meds? yes      Comments: Seeing Dr Eden Emms today.   Allergies: No Known Drug Allergies  Anticoagulation Management History:      The patient is taking warfarin and comes in today for a routine follow up visit.  Positive risk factors for bleeding include an age of 31 years or older and presence of serious comorbidities.  The bleeding index is 'intermediate risk'.  Positive CHADS2 values include History of HTN and History of Diabetes.  Negative CHADS2 values include Age > 33 years old.  The start date was 01/06/2001.  Her last INR was 2.5.  Anticoagulation responsible provider: Riley Kill MD, Maisie Fus.  INR POC: 2.8.  Cuvette Lot#: [id number].  Exp: 10/2010.    Anticoagulation Management Assessment/Plan:      The patient's current anticoagulation dose is Coumadin 3 mg tabs: Take as directed by coumadin clinic..  The target INR is 2 - 3.  The next INR is due 08/27/2009.  Anticoagulation instructions were given to patient.  Results were reviewed/authorized by Bethena Midget, RN, BSN.  She was notified by Bethena Midget, RN, BSN.         Prior Anticoagulation Instructions: INR 3.2  Take 1/2 tablet today then decrease dose to 1 tablet every day except 2 tablets on Monday   Current Anticoagulation Instructions: INR 2.8 Continue 3mg s daily except 6mg s on Mondays. Recheck in 3 weeks.

## 2010-04-07 NOTE — Medication Information (Signed)
Summary: rov/tm  Anticoagulant Therapy  Managed by: Weston Brass, PharmD Referring MD: Charlton Haws MD Supervising MD: Daleen Squibb MD, Maisie Fus Indication 1: Atrial Fibrillation (ICD-427.31) Lab Used: [facility] Site: [facility] INR POC 3.1 INR RANGE 2 - 3  Dietary changes: yes       Details: did not eat as many greens lately due to death in the family   Health status changes: no    Bleeding/hemorrhagic complications: no    Recent/future hospitalizations: no    Any changes in medication regimen? no    Recent/future dental: no  Any missed doses?: no       Is patient compliant with meds? yes       Allergies: No Known Drug Allergies  Anticoagulation Management History:      The patient is taking warfarin and comes in today for a routine follow up visit.  Positive risk factors for bleeding include an age of 75 years or older and presence of serious comorbidities.  The bleeding index is 'intermediate risk'.  Positive CHADS2 values include History of HTN and History of Diabetes.  Negative CHADS2 values include Age > 4 years old.  The start date was 01/06/2001.  Her last INR was 2.5.  Anticoagulation responsible provider: Daleen Squibb MD, Maisie Fus.  INR POC: 3.1.  Exp: 07/2010.    Anticoagulation Management Assessment/Plan:      The patient's current anticoagulation dose is Coumadin 3 mg tabs: Take as directed by coumadin clinic..  The target INR is 2 - 3.  The next INR is due 07/04/2009.  Anticoagulation instructions were given to patient.  Results were reviewed/authorized by Weston Brass, PharmD.  She was notified by Weston Brass PharmD.         Prior Anticoagulation Instructions: INR 2.4 Continue 3mg s daily exept 6mg s on Mondays and Fridays. Recheck in 2 weeks.   Current Anticoagulation Instructions: INR 3.1  Take 1 tablet today then resume same dose of 1 tablet every day except 2 tablets on Monday and Friday

## 2010-04-07 NOTE — Medication Information (Signed)
Summary: rov/eac  Anticoagulant Therapy  Managed by: Weston Brass, PharmD Referring MD: Charlton Haws MD Supervising MD: Graciela Husbands MD, Viviann Spare Indication 1: Atrial Fibrillation (ICD-427.31) Lab Used: [facility] Site: [facility] INR POC 3.2 INR RANGE 2 - 3  Dietary changes: yes       Details: diet has been off; her husband has been in the hospital for past 2 weeks  Health status changes: no    Bleeding/hemorrhagic complications: no    Recent/future hospitalizations: no    Any changes in medication regimen? no    Recent/future dental: no  Any missed doses?: no       Is patient compliant with meds? yes       Allergies: No Known Drug Allergies  Anticoagulation Management History:      The patient is taking warfarin and comes in today for a routine follow up visit.  Positive risk factors for bleeding include an age of 75 years or older and presence of serious comorbidities.  The bleeding index is 'intermediate risk'.  Positive CHADS2 values include History of HTN and History of Diabetes.  Negative CHADS2 values include Age > 41 years old.  The start date was 01/06/2001.  Her last INR was 2.5.  Anticoagulation responsible provider: Graciela Husbands MD, Viviann Spare.  INR POC: 3.2.  Cuvette Lot#: [id number].  Exp: 10/2010.    Anticoagulation Management Assessment/Plan:      The patient's current anticoagulation dose is Coumadin 3 mg tabs: Take as directed by coumadin clinic..  The target INR is 2 - 3.  The next INR is due 08/06/2009.  Anticoagulation instructions were given to patient.  Results were reviewed/authorized by Weston Brass, PharmD.  She was notified by Weston Brass PharmD.         Prior Anticoagulation Instructions: INR 3.4 Today skip today then change dose to 3mg s daily except 6mg s on Mondays. Recheck in 2 weeks.   Current Anticoagulation Instructions: INR 3.2  Take 1/2 tablet today then decrease dose to 1 tablet every day except 2 tablets on Monday

## 2010-04-07 NOTE — Medication Information (Signed)
Summary: [facility]  Anticoagulant Therapy  Managed by: Bethena Midget, RN, BSN Referring MD: Charlton Haws MD Supervising MD: Antoine Poche MD, Fayrene Fearing Indication 1: Atrial Fibrillation (ICD-427.31) Lab Used: [facility] Site: [facility] INR POC 4.0 INR RANGE 2 - 3  Dietary changes: no    Health status changes: no    Bleeding/hemorrhagic complications: no    Recent/future hospitalizations: no    Any changes in medication regimen? no    Recent/future dental: no  Any missed doses?: no       Is patient compliant with meds? yes       Allergies: No Known Drug Allergies  Anticoagulation Management History:      The patient is taking warfarin and comes in today for a routine follow up visit.  Positive risk factors for bleeding include an age of 75 years or older and presence of serious comorbidities.  The bleeding index is 'intermediate risk'.  Positive CHADS2 values include History of HTN and History of Diabetes.  Negative CHADS2 values include Age > 75 years old.  The start date was 01/06/2001.  Her last INR was 2.5.  Anticoagulation responsible provider: Antoine Poche MD, Fayrene Fearing.  INR POC: 4.0.  Cuvette Lot#: [id number].  Exp: 07/2010.    Anticoagulation Management Assessment/Plan:      The patient's current anticoagulation dose is Coumadin 3 mg tabs: Take as directed by coumadin clinic..  The target INR is 2 - 3.  The next INR is due 05/30/2009.  Anticoagulation instructions were given to patient.  Results were reviewed/authorized by Bethena Midget, RN, BSN.  She was notified by Bethena Midget, RN, BSN.         Prior Anticoagulation Instructions: INR 2.1 Continue 3mg s daily except 6mg s on Mondays and Fridays. Recheck in 4 weeks.    Current Anticoagulation Instructions: INR 4.0 Skip today's dose. Then resume 3mg s daily except 6mg s on Mondays and Fridays. Recheck in 2 weeks.

## 2010-04-07 NOTE — Medication Information (Signed)
Summary: rov.mp  Anticoagulant Therapy  Managed by: Leota Sauers, PharmD, BCPS, CPP Referring MD: Charlton Haws MD Supervising MD: Ladona Ridgel MD, Sharlot Gowda Indication 1: Atrial Fibrillation (ICD-427.31) Lab Used: [facility] Site: [facility] INR POC 3.3 INR RANGE 2 - 3  Dietary changes: yes       Details: may have had less green vegies  Health status changes: no    Bleeding/hemorrhagic complications: no    Recent/future hospitalizations: no    Any changes in medication regimen? no    Recent/future dental: no  Any missed doses?: no       Is patient compliant with meds? yes       Current Medications (verified): 1)  Detrol La 4 Mg Xr24h-Cap (Tolterodine Tartrate) .... Once Daily 2)  Byetta 5 Mcg Pen  Soln (Exenatide) .... Two Times A Day 3)  Meloxicam 15 Mg Tabs (Meloxicam) .Marland Kitchen.. 1 Tab By Mouth Once Daily 4)  Gabapentin 300 Mg Caps (Gabapentin) .... Two Times A Day 5)  Amlodipine/benazepril 5/20 .Marland Kitchen.. 1 Tab By Mouth Once Daily 6)  Bumetanide 1 Mg Tabs (Bumetanide) .... Once Daily 7)  Aspirin 81 Mg Tbec (Aspirin) .... Take One Tablet By Mouth Daily 8)  Multivitamins   Tabs (Multiple Vitamin) .... Once Daily 9)  Calcium Carbonate-Vitamin D 600-400 Mg-Unit  Tabs (Calcium Carbonate-Vitamin D) .... Two Times A Day 10)  Cozaar 50 Mg Tabs (Losartan Potassium) .... Take One Tablet By Mouth Daily 11)  Humulin 70/30 70-30 % Susp (Insulin Isophane & Regular) .... As Directed 12)  Ferrous Sulfate 325 (65 Fe) Mg Tbec (Ferrous Sulfate) .... Once Daily 13)  Zocor 40 Mg Tabs (Simvastatin) .Marland Kitchen.. 1 Tab By Mouth Once Daily 14)  Coumadin 3 Mg Tabs (Warfarin Sodium) .... Take As Directed By Coumadin Clinic.  Allergies (verified): No Known Drug Allergies  Anticoagulation Management History:      The patient is taking warfarin and comes in today for a routine follow up visit.  Positive risk factors for bleeding include an age of 75 years or older and presence of serious comorbidities.  The bleeding  index is 'intermediate risk'.  Positive CHADS2 values include History of HTN and History of Diabetes.  Negative CHADS2 values include Age > 27 years old.  The start date was 01/06/2001.  Her last INR was 2.5.  Anticoagulation responsible provider: Ladona Ridgel MD, Sharlot Gowda.  INR POC: 3.3.  Cuvette Lot#: [id number].  Exp: 07/06/2010.    Anticoagulation Management Assessment/Plan:      The patient's current anticoagulation dose is Coumadin 3 mg tabs: Take as directed by coumadin clinic..  The target INR is 2 - 3.  The next INR is due 04/17/2009.  Anticoagulation instructions were given to patient.  Results were reviewed/authorized by Leota Sauers, PharmD, BCPS, CPP.         Prior Anticoagulation Instructions: INR 2.5  CONTINUE TO TAKE 1 TAB EVERYDAY EXCEPT TAKE 2 TABS ON MONDAY AND FRIDAY.  RECHECK IN 4 WEEKS.  Current Anticoagulation Instructions: INR 3.3  Coumadin 2 tabs = 6mg  on Mon and Fri 1 tab = 3mg  all other days

## 2010-04-07 NOTE — Medication Information (Signed)
Summary: rov/ewj  Anticoagulant Therapy  Managed by: Weston Brass, PharmD Referring MD: Charlton Haws MD Supervising MD: Riley Kill MD, Maisie Fus Indication 1: Atrial Fibrillation (ICD-427.31) Lab Used: [facility] Site: [facility] INR POC 2.4 INR RANGE 2 - 3  Dietary changes: no    Health status changes: no    Bleeding/hemorrhagic complications: no    Recent/future hospitalizations: no    Any changes in medication regimen? no    Recent/future dental: no  Any missed doses?: no       Is patient compliant with meds? yes       Allergies: No Known Drug Allergies  Anticoagulation Management History:      The patient is taking warfarin and comes in today for a routine follow up visit.  Positive risk factors for bleeding include an age of 6 years or older and presence of serious comorbidities.  The bleeding index is 'intermediate risk'.  Positive CHADS2 values include History of HTN and History of Diabetes.  Negative CHADS2 values include Age > 74 years old.  The start date was 01/06/2001.  Her last INR was 2.5.  Anticoagulation responsible provider: Riley Kill MD, Maisie Fus.  INR POC: 2.4.  Cuvette Lot#: [id number].  Exp: 11/2010.    Anticoagulation Management Assessment/Plan:      The patient's current anticoagulation dose is Coumadin 3 mg tabs: Take as directed by coumadin clinic..  The target INR is 2 - 3.  The next INR is due 10/17/2009.  Anticoagulation instructions were given to patient.  Results were reviewed/authorized by Weston Brass, PharmD.  She was notified by Weston Brass, PharmD.         Prior Anticoagulation Instructions: INR 2.7  Continue on same dosage 3mg  tablets daily.  Recheck in 2 weeks.  Current Anticoagulation Instructions: INR 2.4  Continue same dose of 1 tablet every day.

## 2010-04-09 NOTE — Medication Information (Signed)
Summary: rov/sp   Anticoagulant Therapy  Managed by: Weston Brass, PharmD Referring MD: Charlton Haws MD Supervising MD: Jens Som MD, Arlys John Indication 1: Atrial Fibrillation (ICD-427.31) Lab Used: [facility] Site: [facility] INR POC 2.3 INR RANGE 2 - 3  Dietary changes: no    Health status changes: no    Bleeding/hemorrhagic complications: no    Recent/future hospitalizations: no    Any changes in medication regimen? no    Recent/future dental: no  Any missed doses?: no       Is patient compliant with meds? yes       Allergies: No Known Drug Allergies  Anticoagulation Management History:      The patient is taking warfarin and comes in today for a routine follow up visit.  Positive risk factors for bleeding include an age of 75 years or older and presence of serious comorbidities.  The bleeding index is 'intermediate risk'.  Positive CHADS2 values include History of HTN and History of Diabetes.  Negative CHADS2 values include Age > 75 years old.  The start date was 01/06/2001.  Her last INR was 2.5.  Anticoagulation responsible provider: Jens Som MD, Arlys John.  INR POC: 2.3.  Cuvette Lot#: [id number].  Exp: 04/2011.    Anticoagulation Management Assessment/Plan:      The patient's current anticoagulation dose is Coumadin 3 mg tabs: Take as directed by coumadin clinic..  The target INR is 2 - 3.  The next INR is due 04/02/2010.  Anticoagulation instructions were given to patient.  Results were reviewed/authorized by Weston Brass, PharmD.  She was notified by Weston Brass PharmD.         Prior Anticoagulation Instructions: INR 2.0  Continue same dose of 1 tablet every day.  Recheck INR in 4 weeks.   Current Anticoagulation Instructions: INR 2.3  Continue same dose of 1 tablet every day.  Recheck INR in 4 weeks.

## 2010-04-09 NOTE — Medication Information (Signed)
Summary: rov/sp   Anticoagulant Therapy  Managed by: Weston Brass, PharmD Referring MD: Charlton Haws MD Supervising MD: Myrtis Ser MD, Tinnie Gens Indication 1: Atrial Fibrillation (ICD-427.31) Lab Used: [facility] Site: [facility] INR POC 2.3 INR RANGE 2 - 3  Dietary changes: no    Health status changes: no    Bleeding/hemorrhagic complications: no    Recent/future hospitalizations: no    Any changes in medication regimen? no    Recent/future dental: no  Any missed doses?: no       Is patient compliant with meds? yes       Allergies: No Known Drug Allergies  Anticoagulation Management History:      Positive risk factors for bleeding include an age of 75 years or older and presence of serious comorbidities.  The bleeding index is 'intermediate risk'.  Positive CHADS2 values include History of HTN and History of Diabetes.  Negative CHADS2 values include Age > 22 years old.  The start date was 01/06/2001.  Her last INR was 2.5.  Anticoagulation responsible provider: Myrtis Ser MD, Tinnie Gens.  INR POC: 2.3.  Exp: 04/2011.    Anticoagulation Management Assessment/Plan:      The patient's current anticoagulation dose is Coumadin 3 mg tabs: Take as directed by coumadin clinic..  The target INR is 2 - 3.  The next INR is due 04/30/2010.  Anticoagulation instructions were given to patient.  Results were reviewed/authorized by Weston Brass, PharmD.  She was notified by Weston Brass PharmD.         Prior Anticoagulation Instructions: INR 2.3  Continue same dose of 1 tablet every day.  Recheck INR in 4 weeks.   Current Anticoagulation Instructions: Same as Prior Instructions.

## 2010-04-30 ENCOUNTER — Encounter (INDEPENDENT_AMBULATORY_CARE_PROVIDER_SITE_OTHER): Payer: Medicare Other

## 2010-04-30 ENCOUNTER — Encounter: Payer: Self-pay | Admitting: Internal Medicine

## 2010-04-30 DIAGNOSIS — Z7901 Long term (current) use of anticoagulants: Secondary | ICD-10-CM

## 2010-04-30 DIAGNOSIS — I4891 Unspecified atrial fibrillation: Secondary | ICD-10-CM

## 2010-05-05 NOTE — Medication Information (Signed)
Summary: rov/sp   Anticoagulant Therapy  Managed by: Windell Hummingbird, RN Referring MD: Charlton Haws MD Supervising MD: Ladona Ridgel MD, Sharlot Gowda Indication 1: Atrial Fibrillation (ICD-427.31) Lab Used: [facility] Site: [facility] INR POC 4.7 INR RANGE 2 - 3  Dietary changes: no    Health status changes: no    Bleeding/hemorrhagic complications: no    Recent/future hospitalizations: no    Any changes in medication regimen? no    Recent/future dental: no  Any missed doses?: no       Is patient compliant with meds? yes       Allergies: No Known Drug Allergies  Anticoagulation Management History:      The patient is taking warfarin and comes in today for a routine follow up visit.  Positive risk factors for bleeding include an age of 75 years or older and presence of serious comorbidities.  The bleeding index is 'intermediate risk'.  Positive CHADS2 values include History of HTN and History of Diabetes.  Negative CHADS2 values include Age > 60 years old.  The start date was 01/06/2001.  Her last INR was 2.5.  Anticoagulation responsible provider: Ladona Ridgel MD, Sharlot Gowda.  INR POC: 4.7.  Cuvette Lot#: [id number].  Exp: 03/2011.    Anticoagulation Management Assessment/Plan:      The patient's current anticoagulation dose is Coumadin 3 mg tabs: Take as directed by coumadin clinic..  The target INR is 2 - 3.  The next INR is due 05/14/2010.  Anticoagulation instructions were given to patient.  Results were reviewed/authorized by Windell Hummingbird, RN.  She was notified by Windell Hummingbird, RN.         Prior Anticoagulation Instructions: INR 2.3  Continue same dose of 1 tablet every day.  Recheck INR in 4 weeks.   Current Anticoagulation Instructions: INR 4.7 Skip today's dose and tomorrow's dose.  Then resume taking 1 tablet every day. Recheck in 2 weeks.

## 2010-05-14 ENCOUNTER — Encounter: Payer: Self-pay | Admitting: Internal Medicine

## 2010-05-14 ENCOUNTER — Encounter (INDEPENDENT_AMBULATORY_CARE_PROVIDER_SITE_OTHER): Payer: Medicare Other

## 2010-05-14 DIAGNOSIS — Z7901 Long term (current) use of anticoagulants: Secondary | ICD-10-CM

## 2010-05-14 DIAGNOSIS — I4891 Unspecified atrial fibrillation: Secondary | ICD-10-CM

## 2010-05-14 LAB — CONVERTED CEMR LAB: POC INR: 4.3

## 2010-05-19 NOTE — Medication Information (Signed)
Summary: rov/pc  Anticoagulant Therapy  Managed by: Windell Hummingbird, RN Referring MD: Charlton Haws MD Supervising MD: Ladona Ridgel MD, Sharlot Gowda Indication 1: Atrial Fibrillation (ICD-427.31) Lab Used: [facility] Site: [facility] INR POC 4.3 INR RANGE 2 - 3  Dietary changes: no    Health status changes: no    Bleeding/hemorrhagic complications: no    Recent/future hospitalizations: no    Any changes in medication regimen? no    Recent/future dental: no  Any missed doses?: no       Is patient compliant with meds? yes       Allergies: No Known Drug Allergies  Anticoagulation Management History:      The patient is taking warfarin and comes in today for a routine follow up visit.  Positive risk factors for bleeding include an age of 75 years or older and presence of serious comorbidities.  The bleeding index is 'intermediate risk'.  Positive CHADS2 values include History of HTN and History of Diabetes.  Negative CHADS2 values include Age > 38 years old.  The start date was 01/06/2001.  Her last INR was 2.5.  Anticoagulation responsible provider: Ladona Ridgel MD, Sharlot Gowda.  INR POC: 4.3.  Cuvette Lot#: [id number].  Exp: 05/2011.    Anticoagulation Management Assessment/Plan:      The patient's current anticoagulation dose is Coumadin 3 mg tabs: Take as directed by coumadin clinic..  The target INR is 2 - 3.  The next INR is due 05/28/2010.  Anticoagulation instructions were given to patient.  Results were reviewed/authorized by Windell Hummingbird, RN.  She was notified by Windell Hummingbird, RN.         Prior Anticoagulation Instructions: INR 4.7 Skip today's dose and tomorrow's dose.  Then resume taking 1 tablet every day. Recheck in 2 weeks.  Current Anticoagulation Instructions: INR 4.3 Skip today and tomorrow.  Then, begin taking 1 tablet every day, except take 1/2 tablet on Mondays. Return in 2 weeks.

## 2010-05-28 ENCOUNTER — Ambulatory Visit (INDEPENDENT_AMBULATORY_CARE_PROVIDER_SITE_OTHER): Payer: Medicare Other | Admitting: *Deleted

## 2010-05-28 DIAGNOSIS — Z7901 Long term (current) use of anticoagulants: Secondary | ICD-10-CM

## 2010-05-28 DIAGNOSIS — I4891 Unspecified atrial fibrillation: Secondary | ICD-10-CM

## 2010-05-28 LAB — POCT INR: INR: 2.8

## 2010-05-28 NOTE — Patient Instructions (Signed)
INR 2.8 Continue taking 1 tablet (3mg ) daily, except take 1/2 tablet (1.5mg ) on Mondays. Recheck in 4 weeks.

## 2010-06-23 LAB — CBC
HCT: 23.9 % — ABNORMAL LOW (ref 36.0–46.0)
Hemoglobin: 10.3 g/dL — ABNORMAL LOW (ref 12.0–15.0)
Hemoglobin: 12.8 g/dL (ref 12.0–15.0)
MCHC: 34.6 g/dL (ref 30.0–36.0)
MCHC: 35.8 g/dL (ref 30.0–36.0)
MCV: 97.3 fL (ref 78.0–100.0)
Platelets: 111 10*3/uL — ABNORMAL LOW (ref 150–400)
Platelets: 127 10*3/uL — ABNORMAL LOW (ref 150–400)
RBC: 2.45 MIL/uL — ABNORMAL LOW (ref 3.87–5.11)
RBC: 3.11 MIL/uL — ABNORMAL LOW (ref 3.87–5.11)
RBC: 3.87 MIL/uL (ref 3.87–5.11)
RDW: 13.3 % (ref 11.5–15.5)
WBC: 6.2 10*3/uL (ref 4.0–10.5)

## 2010-06-23 LAB — URINE MICROSCOPIC-ADD ON

## 2010-06-23 LAB — BASIC METABOLIC PANEL
BUN: 25 mg/dL — ABNORMAL HIGH (ref 6–23)
BUN: 29 mg/dL — ABNORMAL HIGH (ref 6–23)
BUN: 30 mg/dL — ABNORMAL HIGH (ref 6–23)
CO2: 26 mEq/L (ref 19–32)
CO2: 27 mEq/L (ref 19–32)
Calcium: 7.7 mg/dL — ABNORMAL LOW (ref 8.4–10.5)
Calcium: 7.9 mg/dL — ABNORMAL LOW (ref 8.4–10.5)
Calcium: 8 mg/dL — ABNORMAL LOW (ref 8.4–10.5)
Chloride: 101 mEq/L (ref 96–112)
Chloride: 103 mEq/L (ref 96–112)
Creatinine, Ser: 1.18 mg/dL (ref 0.4–1.2)
Creatinine, Ser: 1.43 mg/dL — ABNORMAL HIGH (ref 0.4–1.2)
GFR calc Af Amer: 44 mL/min — ABNORMAL LOW (ref >60)
GFR calc Af Amer: 46 mL/min — ABNORMAL LOW (ref >60)
GFR calc non Af Amer: 36 mL/min — ABNORMAL LOW (ref >60)
GFR calc non Af Amer: 38 mL/min — ABNORMAL LOW (ref >60)
GFR calc non Af Amer: 50 mL/min — ABNORMAL LOW (ref >60)
Glucose, Bld: 133 mg/dL — ABNORMAL HIGH (ref 70–99)
Glucose, Bld: 175 mg/dL — ABNORMAL HIGH (ref 70–99)
Potassium: 4.2 mEq/L (ref 3.5–5.1)
Potassium: 4.5 mEq/L (ref 3.5–5.1)
Sodium: 134 mEq/L — ABNORMAL LOW (ref 135–145)
Sodium: 137 mEq/L (ref 135–145)

## 2010-06-23 LAB — COMPREHENSIVE METABOLIC PANEL
ALT: 16 U/L (ref 0–35)
Alkaline Phosphatase: 80 U/L (ref 39–117)
CO2: 29 mEq/L (ref 19–32)
GFR calc non Af Amer: 46 mL/min — ABNORMAL LOW (ref >60)
Glucose, Bld: 113 mg/dL — ABNORMAL HIGH (ref 70–99)
Potassium: 4.2 mEq/L (ref 3.5–5.1)
Sodium: 140 mEq/L (ref 135–145)

## 2010-06-23 LAB — PROTIME-INR
INR: 1.2 (ref 0.00–1.49)
INR: 1.5 (ref 0.00–1.49)
INR: 1.6 — ABNORMAL HIGH (ref 0.00–1.49)
Prothrombin Time: 16.9 seconds — ABNORMAL HIGH (ref 11.6–15.2)
Prothrombin Time: 19.7 seconds — ABNORMAL HIGH (ref 11.6–15.2)
Prothrombin Time: 19.8 seconds — ABNORMAL HIGH (ref 11.6–15.2)

## 2010-06-23 LAB — GLUCOSE, CAPILLARY
Glucose-Capillary: 124 mg/dL — ABNORMAL HIGH (ref 70–99)
Glucose-Capillary: 142 mg/dL — ABNORMAL HIGH (ref 70–99)
Glucose-Capillary: 146 mg/dL — ABNORMAL HIGH (ref 70–99)
Glucose-Capillary: 154 mg/dL — ABNORMAL HIGH (ref 70–99)
Glucose-Capillary: 156 mg/dL — ABNORMAL HIGH (ref 70–99)
Glucose-Capillary: 171 mg/dL — ABNORMAL HIGH (ref 70–99)
Glucose-Capillary: 178 mg/dL — ABNORMAL HIGH (ref 70–99)
Glucose-Capillary: 251 mg/dL — ABNORMAL HIGH (ref 70–99)

## 2010-06-23 LAB — URINALYSIS, ROUTINE W REFLEX MICROSCOPIC
Bilirubin Urine: NEGATIVE
Glucose, UA: NEGATIVE mg/dL
Ketones, ur: NEGATIVE mg/dL
Protein, ur: 30 mg/dL — AB

## 2010-06-23 LAB — TYPE AND SCREEN: Antibody Screen: NEGATIVE

## 2010-06-23 LAB — APTT: aPTT: 29 seconds (ref 24–37)

## 2010-06-24 ENCOUNTER — Other Ambulatory Visit: Payer: Self-pay | Admitting: *Deleted

## 2010-06-24 ENCOUNTER — Telehealth: Payer: Self-pay | Admitting: Cardiology

## 2010-06-24 MED ORDER — WARFARIN SODIUM 3 MG PO TABS: 3.0000 mg | ORAL_TABLET | ORAL | Status: DC

## 2010-06-24 NOTE — Telephone Encounter (Signed)
Pt needs Warfarin to be called in to  [facility] off [address].

## 2010-06-25 ENCOUNTER — Ambulatory Visit (INDEPENDENT_AMBULATORY_CARE_PROVIDER_SITE_OTHER): Payer: Medicare Other | Admitting: *Deleted

## 2010-06-25 DIAGNOSIS — I4891 Unspecified atrial fibrillation: Secondary | ICD-10-CM

## 2010-07-16 ENCOUNTER — Ambulatory Visit (INDEPENDENT_AMBULATORY_CARE_PROVIDER_SITE_OTHER): Payer: Medicare Other | Admitting: *Deleted

## 2010-07-16 DIAGNOSIS — I4891 Unspecified atrial fibrillation: Secondary | ICD-10-CM

## 2010-07-16 LAB — POCT INR: INR: 2.3

## 2010-07-21 NOTE — Assessment & Plan Note (Signed)
[facility]                            CARDIOLOGY OFFICE NOTE   NAME:Kathryn [name], Kathryn [name]                     MRN:          [id number]  DATE:11/16/2007                            DOB:          06-21-1933    Kathryn [name] returns today for followup.  I congratulated her, she had  her knee surgery at the end of June.  She was at [facility].  She had an  extensive rehab.  She did exceedingly well.  She was referred to me for  her morbid obesity and dyspnea.  She had a murmur and hypertension.  She  had no cardiac complications during the surgery.  She has lost some  weight and has much less pain in her right knee.  She needs to have her  left knee done probably next year.  She has been compliant with her  medications.  Her dyspnea is less.  There has been no PND or orthopnea.  She continues to have trace lower extremity edema, which is dependent.  Her meds include Detrol, meloxicam, gabapentin, amlodipine, Benazepril  5/20 mg, Bumex 1 mg a day, Vytorin 1 every other day dose not reported,  Cozaar 50 a day, insulin as directed, and iron.   Her weight is 297 pounds, blood pressure is 130/66.  She is in AFib at a  rate of 76, afebrile.  HEENT is unremarkable.  Carotids are normal  without bruit.  No lymphadenopathy, thyromegaly, or JVP elevation.  Lungs, clear diaphragmatic motion.  No wheezing.  S1-S2 with systolic  ejection murmur.  PMI not palpable.  Abdomen is protuberant.  Bowel  sounds positive.  No AAA.  No tenderness.  No bruit.  No  hepatosplenomegaly, hepatojugular reflux, or tenderness.  Distal pulses  are intact, status post right knee replacement, +1 edema bilaterally.  Neuro, nonfocal.  Skin, warm and dry.  No muscular weakness.   IMPRESSION:  1. Chronic atrial fibrillation, good rate control.  Follow up in the      Coumadin Clinic next week.  2. Hypertension, currently well controlled.  Continue low-salt diet      and current dose of calcium  blocker and angiotensin-converting      enzyme inhibitor.  3. Systolic ejection murmur.  Echo done on April 21, 2007 showing      trivial mitral regurgitation without significant aortic stenosis.      No need for subacute bacterial endocarditis prophylaxis.  4. Osteoarthritis.  Follow up with Dr. Lequita Halt.  Continue rehab of the      right knee with physical therapy.  The patient should be able to      have her left knee done next year.  5. Hypercholesterolemia.  Continue Vytorin.  Lipid and liver profile      in 6 months.  6. Diabetes.  Her hemoglobin A1cs have been less than 6.  She      continues to follow her sugars at home.  She seems to be doing      fairly well with this.     Noralyn Pick. Eden Emms, MD, [facility]  Electronically Signed  PCN/MedQ  DD: 11/16/2007  DT: 11/17/2007  Job #: [id number]

## 2010-07-21 NOTE — H&P (Signed)
NAMELEZLEE, Kathryn [name]              ACCOUNT NO.:  [id number]   MEDICAL RECORD NO.:  [id number]          PATIENT TYPE:  INP   LOCATION:  NA                           FACILITY:  [facility]   PHYSICIAN:  Ollen Gross, M.D.    DATE OF BIRTH:  12-20-1933   DATE OF ADMISSION:  08/16/2007  DATE OF DISCHARGE:                              HISTORY & PHYSICAL   DATE OF OFFICE VISIT HISTORY AND PHYSICAL:  Jul 11, 2007   CHIEF COMPLAINT:  Right knee pain.   HISTORY OF PRESENT ILLNESS:  The patient is a 77 year old female who has  been seen by Dr. Lequita Halt for ongoing right knee pain.  She has been  treated by Dr. Simonne Come for several years now for arthritis in her  knee; it has been ongoing for quite some time now.  She has attempted to  continue with her activity, but continues to have pain and severe  popping.  She was seen in the office and found have severe end-stage  arthritis, tricompartmental, in both knees.  Both knees are painful; the  right is more symptomatic at this time.  She has been seen by Dr. Charlton Haws preoperatively and felt to be stable.  She is on chronic Coumadin  and may come off 5 days preoperatively.  Risks and benefits were  discussed and she elects to proceed with surgery.   ALLERGIES:  NO KNOWN DRUG ALLERGIES.   CURRENT MEDICATIONS:  Amlodipine/benazepril, gabapentin, simvastatin,  Detrol LA, Cozaar, meloxicam, bumetanide Humulin 70/30, warfarin,  multivitamin, calcium D, baby aspirin and Byetta.   PAST MEDICAL HISTORY:  1. Mild macular degeneration.  2. Hypertension.  3. Hypercholesterolemia.  4. Paroxysmal atrial fibrillation.  5. History of elevated liver function tests secondary to gallstones,      status post gallbladder removal.  6. History of urinary tract infections.  7. Insulin-dependent diabetes mellitus.  8. Childhood illnesses to include measles and mumps.   PAST SURGICAL HISTORY:  1. Appendectomy.  2. Gallbladder removal.   SOCIAL HISTORY:   Married, retired, nonsmoker.  No alcohol.  One child.   FAMILY HISTORY:  Father living, age 29; history of atrial fibrillation,  diabetes and hypertension.  Mother deceased; history of congestive heart  failure, blood pressure and diabetes.  She has siblings with diabetes,  hypertension and atrial fibrillation.   REVIEW OF SYSTEMS:  GENERAL:  No fevers, chills or night sweats.  NEUROLOGIC:  No seizures, syncope or paralysis.  RESPIRATORY:  No shortness breath, productive cough or hemoptysis.  CARDIOVASCULAR:  No chest pain, angina or orthopnea.  GI: No nausea, vomiting, diarrhea or constipation.  GU: No dysuria, hematuria or discharge.  MUSCULOSKELETAL:  Right knee.   PHYSICAL EXAM:  VITAL SIGNS: Pulse 60, respirations 12, blood pressure  144/88.  GENERAL:  A 77 year old white female, well nourished, well developed,  overweight, in no acute distress.  She is alert, oriented and  cooperative, accompanied by her husband.  HEENT:  Normocephalic, atraumatic.  Pupils are equal, round and  reactive.  EOMs intact.  NECK:  Supple.  CHEST:  Clear.  HEART:  Regular rhythm.  S1-S2 noted.  No rubs, thrills or palpitations.  ABDOMEN:  Soft, nontender.  Bowel sounds are present.  RECTAL, BREASTS AND GENITALIA:  Not done, not pertinent to present  illness.  EXTREMITIES:  Right knee:  No effusion.  She has about a 15-degree  flexion contracture and can flex down to about 95 degrees, moderate  crepitus noted.  Left knee:  Range of motion 15-110, moderate crepitus,  no deformity.   IMPRESSION:  Osteoarthritis, right knee greater than left knee.   PLAN:  The patient will be admitted to [facility] to undergo a  right total knee replacement arthroplasty.  Surgery will be performed by  Dr. Ollen Gross.  Dr. Charlton Haws is her cardiologist; he will be  notified of the room number on admission and be consulted if needed for  any medical assistance with the patient throughout the hospital  course.      Alexzandrew L. [name], P.A.C.      Ollen Gross, M.D.  Electronically Signed    ALP/MEDQ  D:  08/11/2007  T:  08/12/2007  Job:  [id number]   cc:   Jeannett Senior A. Evlyn Kanner, M.D.  Fax: [phone]   Noralyn Pick. Eden Emms, MD, [facility]  [address]  [address]  [place]  [place] [address]

## 2010-07-21 NOTE — Assessment & Plan Note (Signed)
[facility]                            CARDIOLOGY OFFICE NOTE   NAME:Kathryn [name], Kathryn [name]                     MRN:          [id number]  DATE:03/31/2007                            DOB:          December 18, 1933    HISTORY:  Ms. Walski is referred today by Dr. Evlyn Kanner and also by our  Coumadin Clinic.  She has chronic AFib.  She has had it since at least  2000.  I have not seen her in 77 years.   Unfortunately since I last saw the patient, she has continued to be very  immobile.  She is essentially wheelchair-bound now.  When I talked to  her, it seems as though she has bilateral knee problems which is what is  keeping her from ambulating.  She has moved from [place] to  [place] to be in a one flat apartment.  She really has not been out  of the wheelchair much for the last 3 weeks.  She has been sick with a  cough and was on Avelox.  Her INR was elevated for a time.   The patient's weight is actually down.  Back when I knew her in 2003,  she was 350 pounds.  She is now 300 pounds.   I had a frank discussion with Keera.  I do not think she should spend  the rest of her life in a wheelchair.  She actually has longevity on her  side.  She has multiple relatives who lived into their 20s and she is  otherwise healthy.  I  told her I would seriously think about being  reevaluated for knee replacement surgery.   From a cardiac perspective, she is not at high risk.  She has no  documented coronary disease.  She has had previously good LV function.  Her last echo was in 2002 with no valve disease.  Her AFib rate control  has been easy and she is asymptomatic with it.  I told her we would do a 2-D echocardiogram to further assess this since  it has been so long and she is in chronic AFib.   REVIEW OF SYSTEMS:  Otherwise remarkable for just finishing a course of  Avelox for URI.  This seems to have resolved.  She has bilateral knee  pain.  Review of systems  otherwise negative.   FAMILY HISTORY:  Remarkable for mother dying at age 75  Father being  still alive at age 2, no premature coronary disease.   PAST MEDICAL HISTORY:  1. Remarkable for diabetes.  2. Hyperlipidemia.  3. Hypercholesterolemia.  4. Neurogenic bladder.  5. Lower extremity edema  6. Chronic AFib.   ALLERGIES:  DENIES.   SOCIAL HISTORY:  She is happily married.  Her husband is very attentive  and was with her today.  She has an only child who lives here in  [place] who has twins.  They seem to enjoy family company and the  move to [place] has been good for her.  She does not smoke or drink  and has currently been in a wheelchair almost full time for  the last 3-4  weeks.   MEDICATIONS:  1. Cozaar 100 a day.  2. Detrol LA 40 a day.  3. Byetta 10 a day.  4. Humulin 70/30 b.i.d.  5. Meloxicam.  6. Gabapentin 300 b.i.d.  7. Amlodipine/Benazepril 5/20.  8. Bumex 1 mg a day.  9. Vytorin, dose unknown.  10.Aspirin.   PHYSICAL EXAMINATION:  GENERAL:  Her exam is remarkable for an obese  white female in a wheelchair.  Affect appropriate.  VITAL SIGNS:  Weight is 300, respiratory rate is 16, afebrile, blood  pressure 150/80, pulse 80-90 and irregular.  HEENT:  Unremarkable.  NECK:  Carotids are normal without bruit.  No lymphadenopathy,  thyromegaly or JVP elevation.  LUNGS:  Clear with good diaphragmatic motion.  No wheezing.  HEART:  S1-S2, distant heart sounds.  PMI not palpable.  ABDOMEN:  Benign.  Bowel sounds positive.  No AAA, no tenderness, no  hepatosplenomegaly or hepatojugular reflux.  EXTREMITIES:  Distal pulses intact,  +1-2 edema bilaterally.  Remarkable  for some paresthesias below the ankles and did not test muscular exam.  The patient has difficulty standing up.  NEURO:  Nonfocal.   DIAGNOSTICS:  EKG shows AFib with nonspecific ST/T wave changes.   IMPRESSION:  1. Chronic atrial fibrillation, good rate control and anticoagulation.       Continue to follow up in Coumadin Clinic.  No bleeding diathesis or      palpitations.  Continue Byetta 10 mg a day.  2. Lower extremity edema.  This is part of the problem given her knee      problems and being in a wheelchair.  I  think her Bumex should be      increased to 1 mg b.i.d.  I will leave this up to Dr. Evlyn Kanner.  She      is a little hesitant to take b.i.d. diuretics because she urinates      so frequently.  I told her to continue to take a dose at night and      get up early to take 1, and hopefully she would be able to leave      the house by 10 a.m.  Continue a low-salt diet.  3. Preoperative clearance.  Again, I do not think that the patient is      prohibitive for surgery.  We will check a 2-D echocardiogram to      make sure LV and RV function are normal.  She is asymptomatic with      her AFib and I do not think her weight is prohibitive for replacing      her knees.  4. Chronic osteoarthritis to be reevaluated by Dr. Lequita Halt.  5. Hypercholesterolemia.  Continue Vytorin, lipid and liver profile in      6 months.  6. Diabetes.  Follow up with Dr. Evlyn Kanner.  Continue insulin and      hemoglobin A1c quarterly.  7. Hypertension, currently well controlled.  Continue current dose of      Cozaar.  Consider changing the Hyzaar if more diuretics are needed.   Again, I think the patient should be more aggressive in regards to PT/OT  and thoughts regarding her knee replacement.  I will see her back in 6  months.     Noralyn Pick. Eden Emms, MD, [facility]  Electronically Signed    PCN/MedQ  DD: 03/31/2007  DT: 03/31/2007  Job #: [id number]

## 2010-07-21 NOTE — H&P (Signed)
[name], [name]   MEDICAL RECORD NO.:  [id number]          PATIENT TYPE:  INP   LOCATION:  1601                         FACILITY:  [facility]   PHYSICIAN:  Ollen Gross, M.D.    DATE OF BIRTH:  23-Jul-1933   DATE OF ADMISSION:  04/15/2008  DATE OF DISCHARGE:                              HISTORY & PHYSICAL   DATE OF OFFICE VISIT HISTORY AND PHYSICAL:  March 19, 2008.   DATE OF ADMISSION:  April 15, 2008.   CHIEF COMPLAINT:  Left knee pain.   HISTORY OF PRESENT ILLNESS:  The patient is a 77 year old female well-  known to Dr. Ollen Gross who has previously undergone a right total  knee back in June of last year.  She is doing fairly well with the right  knee although the left knee continues to have problems.  She is known to  have arthritis in that side.  She has reached a point where she would  like to have the other side done.  Risks and benefits have been  discussed.  She elects to proceed with surgery.  She has been seen  preoperatively by Dr. Evlyn [name] and felt to be stable for surgery.   ALLERGIES:  No known drug allergies.   CURRENT MEDICATIONS:  Amlodipine/benazepril, gabapentin, simvastatin  which she takes every other day, Detrol LA, Cozaar, Mobic, bumetanide,  Humulin 70/30, Coumadin, multivitamin, calcium plus D, baby aspirin,  iron, Byetta.   PAST MEDICAL HISTORY:  1. Mild macular degeneration.  2. Hypertension.  3. Paroxysmal atrial fibrillation.  4. Past history of elevated liver function tests secondary to      gallstones, status post gallbladder removal.  5. Past history of urinary tract infections.  6. Insulin dependent diabetes mellitus.  7. Osteoporosis.  8. Childhood illnesses of measles, mumps.   PAST SURGICAL HISTORY:  Right total knee June of 2009, appendectomy and  cholecystectomy.   SOCIAL HISTORY:  Married, tired.  Nonsmoker.  No alcohol.  One child.   FAMILY HISTORY:  Father living age 74 with history  of hip replacement,  history of atrial fib, diabetes, hypertension.  Mother deceased, history  of congestive heart failure, blood pressure, diabetes.  She has siblings  with diabetes, hypertension, atrial fib.   REVIEW OF SYSTEMS:  GENERAL:  No fevers, chills, night sweats.  NEURO:  No seizures, syncope or paralysis.  RESPIRATORY:  No shortness of  breath, productive cough or hemoptysis.  CARDIOVASCULAR:  No chest pain,  angina or orthopnea.  GI:  No nausea, vomiting, diarrhea, constipation.  GU:  No dysuria, hematuria, discharge.  MUSCULOSKELETAL:  Left knee.   PHYSICAL EXAMINATION:  VITAL SIGNS:  Pulse 84, respirations 14, blood  pressure 148/82.  GENERAL:  77 year old white female well-nourished, well-developed,  overweight, in no acute distress, accompanied  by her husband.  HEENT:  Normocephalic, atraumatic.  Pupils are round and reactive.  Oropharynx clear.  EOMs intact.  NECK:  Supple.  CHEST:  Clear.  HEART:  Regular rate and rhythm with occasional skipped beat (history of  atrial fib).  ABDOMEN:  Soft, round, protuberant.  Active bowel sounds present.  RECTAL/BREASTS/GENITALIA:  Not done.  Not pertinent to present illness.  EXTREMITIES:  Left knee range of motion is 15-120, marked crepitus, no  instability.   IMPRESSION:  Osteoarthritis left knee.   PLAN:  The patient was admitted to [facility] to undergo a  left total knee replacement arthroplasty.  Surgery will be performed by  Dr. Ollen Gross.      Kathryn [name], P.A.C.      Ollen Gross, M.D.  Electronically Signed    ALP/MEDQ  D:  04/15/2008  T:  04/15/2008  Job:  [id number]   cc:   Kathryn [name], M.D.  Fax: [phone]   Kathryn Pick. Eden Emms, MD, [facility]  [address]  [address]  [place]  [place] [address]   Ollen Gross, M.D.  Fax: [phone]

## 2010-07-21 NOTE — Op Note (Signed)
Kathryn [name], Kathryn [name]   MEDICAL RECORD NO.:  [id number]          PATIENT TYPE:  INP   LOCATION:  0001                         FACILITY:  [facility]   PHYSICIAN:  Ollen Gross, M.D.    DATE OF BIRTH:  Aug 16, 1933   DATE OF PROCEDURE:  04/15/2008  DATE OF DISCHARGE:                               OPERATIVE REPORT   PREOPERATIVE DIAGNOSIS:  Osteoarthritis, left knee.   POSTOPERATIVE DIAGNOSIS:  Osteoarthritis, left knee.   PROCEDURE:  Left total knee arthroplasty.   SURGEON:  Ollen Gross, M.D.   ASSISTANT:  Pryor Ochoa, P.A.C.   ANESTHESIA:  Spinal.   ESTIMATED BLOOD LOSS:  Minimal.   DRAINS:  None.   TOURNIQUET TIME:  35 minutes at 300 mmHg.   COMPLICATIONS:  None.   CONDITION:  Stable to recovery.   BRIEF CLINICAL NOTE:  Kathryn [name] is a 77 year old female who has end-  stage arthritis of the left knee with progressively worsening pain and  dysfunction.  She had a previous successful right total knee  arthroplasty and presents for left total knee arthroplasty.   PROCEDURE IN DETAIL:  After successful administration of spinal  anesthetic a tourniquet was placed high on her left thigh and left lower  extremity prepped and draped in the usual sterile fashion.  The  extremity was wrapped in Esmarch, knee flexed, tourniquet inflated to  300 mmHg.  A midline incision was made with 10 blade through  subcutaneous tissue to the level of the extensor mechanism.  A fresh  blade was used make a medial parapatellar arthrotomy.  The soft tissue  of the proximal medial tibia was subperiosteally elevated at the joint  line with the knife and into the semimembranosus bursa with a Cobb  elevator.  The soft tissue laterally was elevated with attention being  paid to avoid the patellar tendon on the tibial tubercle.  The patella  was subluxed laterally, knee flexed to 90 degrees and ACL and PCL  removed.  The drill was used create a starting hole in the  distal femur  and the canal was thoroughly irrigated.  The 5 degrees left valgus  alignment guide was placed and referencing off the posterior condyles,  rotations marked and the block pinned to remove 12 mm of the distal  femur.  I took additional distal femur because of a large preop flexion  contracture of almost 30 degrees.  The distal femoral resection was made  with an oscillating saw.  Sizing block was placed.  A size 3 was most  appropriate medial lateral and 4 was most appropriate AP.  So a 4 narrow  was the right size.  A size 4 cutting block was placed and rotation  marked off the epicondylar axis.  The anterior, posterior nd chamfer  cuts were made.   The tibia was subluxed forward and the menisci were removed.  The  extramedullary tibial alignment guide was placed referencing proximally  at the medial aspect of the tibial tubercle and distally along the  second metatarsal axis and tibial crest.  The block was pinned to remove  about 4 mm from the more deficient medial side.  The tibial resection  was made with an oscillating saw.  A size 3 was the most appropriate  tibial component and the proximal tibia was prepared with the modular  drill and keel punch for the size 3.  Femoral preparation was completed  with the intercondylar cut for the size 4.   A size 3 mobile bearing tibial trial, size 4 posterior stabilized  femoral trial and a 10 mm posterior stabilized rotating platform insert  trial were placed.  With the 10, we got within about 5-7 degrees from  full extension which was a great improvement from preop.  She had  excellent varus and valgus, as well as anterior and posterior speed  stability throughout full range of motion.  The patella was everted and  thickness measured to be 23 mm.  Freehand resection was taken to 13 mm,  a 38 template was placed, lug holes were drilled, trial patella was  placed and it tracked normally.  Osteophytes were then removed off the   posterior femur with the trial in place and I also stripped the  posterior capsule off of the posterior femur in order to regain more  extension.  With this, I got within about 3 degrees from full extension.  All trials were then removed and the cut bone surfaces were prepared  with pulsatile lavage.  The cement was mixed and once ready for  implantation the size 3 mobile bearing tibial tray, the size 4 narrow  posterior stabilized femur and 38 patella were cemented into place.  The  patella was held with a clamp.  The trial 10-mm inserts were placed and  the knee held in full extension and all extruded cement removed.  When  the cement was fully hardened then the permanent 10 mm posterior  stabilized rotating platform insert was placed in the tibial tray.  The  wound was copiously irrigated with saline solution and then the FloSeal  injected on the posterior capsule, the medial and lateral gutters and  suprapatellar area.  A moist sponge was placed and the tourniquet  released for a total time of 35 minutes.  The sponge was held for 2  minutes and then removed.  Minimal bleeding was encountered.  The  bleeding that was encountered was stopped with electrocautery.  The  wound was copiously irrigated with saline solution again and the  arthrotomy closed with interrupted #1 PDS.  Flexion against gravity was  about 130 degrees and she was within 3-5 degrees from full extension.  The subcu was then closed with interrupted 2-0 Vicryl and subcuticular  running 4-0 Monocryl.  The incision was cleaned and dried and Steri-  Strips and a bulky sterile dressing were applied.  She was then placed  into a knee immobilizer, awakened and transported to recovery in stable  condition.      Ollen Gross, M.D.  Electronically Signed     FA/MEDQ  D:  04/15/2008  T:  04/15/2008  Job:  [id number]

## 2010-07-21 NOTE — Consult Note (Signed)
Kathryn [name], Kathryn [name]              ACCOUNT NO.:  [id number]   MEDICAL RECORD NO.:  [id number]          PATIENT TYPE:  INP   LOCATION:  1603                         FACILITY:  [facility]   PHYSICIAN:  Kari Baars, M.D.  DATE OF BIRTH:  09-Jan-1934   DATE OF CONSULTATION:  08/16/2007  DATE OF DISCHARGE:                                 CONSULTATION   REASON FOR CONSULTATION:  Medical management, status post right total  knee arthroplasty, diabetes management.   REQUESTING PHYSICIAN:  Ollen Gross, M.D.   HISTORY OF PRESENT ILLNESS:  Ms. Pavlak is a 77 year old white female  with type 2 diabetes, insulin-dependent, complicated by nephropathy,  atrial fibrillation, on chronic anticoagulation, who presented for an  elective right total knee replacement today due to severe  osteoarthritis.  We have been asked to see the patient for postoperative  medical management.  Patient reports that she has had excellent control  of her diabetes since diagnosed in 1988.  She has had some nephropathy  with a baseline creatinine of 1.5.  She is currently on 70/30 and Byetta  with a most recent A1C of 5.6.  She reports infrequent hypoglycemia and  states that her sugar was 70 last night.  She did take her 70/30, usual  dose, last evening but did not take any insulin this morning.  She  tolerated her surgery extremely well with minimal blood loss and tight  control of her blood pressure with blood pressures consistently around  100/60.  Her pain is currently well controlled.  Of note, she does have  a history of atrial fibrillation, which were noted incidentally.  She  has not had any symptomatic palpitations in the past but has been  maintained on Coumadin therapy per Dr. Eden Emms.  She denies any recent  chest pain, shortness of breath, orthopnea, or p.m. dyspnea.  No  bleeding complications in the past.   REVIEW OF SYSTEMS:  All systems were reviewed with the patient and are  negative except as in  HPI.   PAST MEDICAL HISTORY:  1. Type 2 diabetes, insulin dependent with nephropathy (baseline      creatinine 1.4, A1C in April, 2007 5.6).  2. Hypertension.  3. Hyperlipidemia.  4. Atrial fibrillation on chronic anticoagulation.  5. Mild carotid stenosis (1-39% in April, 2007).  6. Osteoarthritis.  7. Status post cholecystectomy.  8. Status post appendectomy.  9. Macular degeneration.   HOME MEDICATIONS:  1. Aspirin 81 mg daily.  2. Lotrel 5/20 daily.  3. Neurontin 800 mg b.i.d.  4. Coumadin 2.5 mg on Monday, Wednesday, Friday, and Sunday, and 5 mg      on Tuesday, Thursday, and Saturday.  5. Meloxicam daily.  6. Cozaar 100 mg daily.  7. Bumex 1 mg daily.  8. 70/30, 30 units in the morning and 12 units in the evening.  9. Vytorin 10/40 every other day.  10.Byetta 10 mcg b.i.d.  11.Calcium plus vitamin D.   ALLERGIES:  No known drug allergies.   SOCIAL HISTORY:  She is married with one son.  She has no tobacco,  alcohol, or drug use.  FAMILY HISTORY:  Father has diabetes and atrial fibrillation.  He is  alive in his 1s.  Mother died of end-stage renal disease.  There is a  significant family history of atrial fibrillation.   PHYSICAL EXAMINATION:  Temperature 97, pulse 78, respirations 20, oxygen  saturation 94% on 2 liters.  Blood pressure 136/53.  CBG 146  postoperatively.  GENERAL:  A pleasant, obese white female in no acute distress.  HEENT:  Oropharynx is moist.  NECK:  Supple without lymphadenopathy, JVD, or carotid bruits.  HEART:  Regular rate and rhythm without murmurs, rubs or gallops.  LUNGS:  Clear to auscultation bilaterally.  ABDOMEN:  Soft, nontender, nondistended.  Normoactive bowel sounds.  EXTREMITIES:  No clubbing, cyanosis or edema.  Left leg is immobilized.  NEUROLOGIC:  Nonfocal.   LABS:  CBC shows a white count of 5.7, hemoglobin 11.3, platelets 203.  BMET significant for sodium 140, potassium 5.1, chloride 105, bicarb 28,  BUN 62,  creatinine 1.5, glucose 113.  Of note, these labs were from June  4.  She also had a urinalysis at that time which showed 21-50 white  cells.  INR today reveals 1.5.   ASSESSMENT/PLAN:  1. Type 2 diabetes:  We will change her 70/30 to Lantus at one-half      the basal dose (20 units in the morning) to prevent hypoglycemia.      She already received her 70/30 dose tonight.  We will monitor her      blood sugars closely.  She will continue on sliding-scale insulin      and transition back to her home 70/30 insulin regimen, which she      has tolerated p.o.  2. Hypertension:  Her blood pressures have been well controlled      perioperatively.  We will hold her ACE inhibitor and ARB (Lotensin      and Cozaar) as well as her Bumex for 24-48 hours to prevent any      worsening renal function due to volume depletion following surgery.      If her renal function is stable, we will resume these agents as her      blood pressure warrants.  3. Chronic renal insufficiency:  Baseline creatinine is around 1.5.      We will monitor her renal function closely with hydration      postoperatively.  She is at risk for worsening renal function, so      her ACE inhibitor and ARB will be held.  4. Atrial fibrillation:  She has not had any symptomatic episodes      recently.  Her Coumadin has been restarted.  Will continue on      Lovenox until she is therapeutic.  She is currently in a normal      sinus rhythm.  5. Status post right total knee arthroplasty:  Postoperative      management per orthopedics.   We appreciate the opportunity to participate in the care of Ms. Hollywood.  We will follow closely with you during this hospitalization and as she  transitions back home.      Kari Baars, M.D.  Electronically Signed     WS/MEDQ  D:  08/16/2007  T:  08/16/2007  Job:  [id number]   cc:   Ollen Gross, M.D.  Fax: [phone]   Noralyn Pick. Eden Emms, MD, [facility]  [address]  [address]  [place]  [place]  [address]

## 2010-07-21 NOTE — Discharge Summary (Signed)
NAMEAANIKA, Kathryn [name]              ACCOUNT NO.:  [id number]   MEDICAL RECORD NO.:  [id number]          PATIENT TYPE:  INP   LOCATION:  1409                         FACILITY:  [facility]   PHYSICIAN:  Ollen Gross, M.D.    DATE OF BIRTH:  1933/10/12   DATE OF ADMISSION:  08/16/2007  DATE OF DISCHARGE:  08/22/2007                               DISCHARGE SUMMARY   ADMISSION DIAGNOSES:  1. Osteoarthritis right knee greater than left knee.  2. Macular degeneration.  3. Hypertension.  4. Hypercholesterolemia.  5. Atrial fibrillation with chronic anticoagulation.  6. Past history of elevated liver function tests secondary to      gallstones, status post gallbladder removal.  7. History of urinary tract infections with preoperative urinary tract      infection treated as an outpatient prior to surgery.  8. Insulin dependent diabetes mellitus.  9. Mild carotid stenosis.   DISCHARGE DIAGNOSES:  1. Osteoarthritis right knee status post right total knee replacement      arthroplasty.  2. Osteoarthritis left knee.  3. Postoperative acute blood loss anemia.  4. Status post transfusion without sequelae.  5. Postoperative acute renal insufficiency failure, improved.  6. Mild postoperative hypotension, resolved.  7. Mild postoperative hyperkalemia with one elevated potassium level      secondary to hemolyzed specimen.  8. Macular degeneration.  9. Hypertension.  10.Hypercholesterolemia.  11.Atrial fibrillation with chronic anticoagulation.  12.Past history of elevated liver function tests secondary to      gallstones, status post gallbladder removal.  13.History of urinary tract infections with preoperative urinary tract      infection treated as an outpatient prior to surgery.  14.Insulin dependent diabetes mellitus.  15.Mild carotid stenosis.  16.Postoperative hyponatremia, improved.   PROCEDURE:  August 16, 2007, right total knee arthroplasty.  Surgeon, Dr.  Homero Fellers Aluisio. Assistant, Oneida Alar, PA-C.  Anesthesia, general.   CONSULTS:  1. W. Buren Kos, M.D.  2. Tera Mater. [place], M.D.   BRIEF HISTORY:  Kathryn [name] is a 77 year old female with severe end-  stage arthritis of both knees, right more symptomatic than left.  She  has nonoperative management and now presents for a total knee  arthroplasty.   LABORATORY DATA:  CBC on admission showed a low hemoglobin of 11.3,  hematocrit 32.2, white cell count 5.7, platelets 203.  Preop UA showed  moderate leukocyte esterase with 21-50 white cells and 0-2 red cells,  few bacteria.  This was treated preoperatively.  Chem panel on  admission, mildly elevated glucose of 113, elevated BUN of 62,  creatinine mildly elevated at 1.53, low albumin of 3.4.  Remaining Chem  panel within normal limits.  PT/INR 18.4 and 1.5 with PTT of 33  preoperatively.  Blood group type A negative.  Serial CBCs were  followed.  Hemoglobin did drop down to 7.8 postoperatively, given blood  back up to 8.9, drifted back down to 8.4, given another unit.  Hemoglobin came back up to 9.4.  Last noted H&H 8.9 and 25.6.  Serial  protimes followed for Coumadin protocol.  Last noted protime/INR 24.2  and 2.1.  Serial BMETS were followed.  Sodium did drop postoperatively  down to 129, came back up to 136.  Her potassium started out mildly  elevated at 5.1, high normal; it went up to 5.2, then 5.3 where it  stabilized.  She had one elevated potassium at the end of 6, but that  was felt to be due to a hemolyzed specimen.  Her creatinine, which was  noted to be slightly elevated at 1.53 preoperatively, did go up to 1.56  and eventually 2.19 due to her renal insufficiency and renal failure.  This did improve.  It came back down to 1.49, and the last creatinine  was back down to a normal level of 0.99 which is even better than  preoperatively.  She did have one  cardiac panel drawn on August 18, 2007.  CK was elevated at 396, CK-MB was elevated 5.1; however, had a  normal  index of 1.3 and troponin was normal at 0.03.  A heparin antibody screen  was drawn also on August 18, 2007, which was negative.   X-RAYS:  Two-view chest August 10, 2007, borderline heart size, no acute  pulmonary findings.   EKG:  Atrial fibrillation, low-voltage QRS, cannot rule out anterior  infarct age undetermined.  This was confirmed EKG, unable to read  signature.  Date of that is March 31, 2007.  Follow-up EKG on August 18, 2007, atrial fib, low-voltage QRS cannot rule up in anterior infarct age  undetermined.  This is unconfirmed.   HOSPITAL COURSE:  The patient was admitted to [facility],  taken to the OR and underwent above-stated procedure without  complication.  The patient tolerated the procedure well and later  transferred to the recovery room and then to the medical floor for  postoperative care.  Her medical physician, Dr. Ardyth Harps; Dr. Eric Form was covering for Dr. Evlyn Kanner.  Medical consult was called and the  patient was seen postoperatively by Dr. Clelia Croft to assist with postop  medical management and also to help assist with her diabetes care.  She  did have ACE inhibitor and other medications which were held  postoperatively to help try and prevent any worsening renal function  which she had preoperatively.  She did have some mild chronic renal  insufficiency with a slightly elevated creatinine.  This was monitored.  She was given p.o. and IV analgesics for pain control following surgery  given 24 hours postop IV antibiotics.  Unfortunately, on day one  postoperatively, her hemoglobin dropped down to 7.8 with some  hypotension.  This was felt to be due to acute blood loss and  medications.  It was felt she would best be served by undergoing  transfusion.  She was given 2 units of blood.  Also with the renal  insufficiency, medications were adjusted.  Several blood pressures meds  were held due to the acute renal insufficiency and also the  hypotension.  These were adjusted by Dr. Clelia Croft.  She was given the blood and responded  well.  Pressure came back up and by postop day #2 her pressures were  back up with systolics in 100s versus 80s the day before.  She did have  some mild thrombocytopenia noted postoperatively which was felt to be  due more to acute blood loss.  She was given Lovenox postoperatively for  a bridge since she was on chronic coumadinization.  The Lovenox was held  and a HIT screen was ordered.  It was felt due  to her progress and  situation, she would need skilled nursing facility.  Social worker got  involved postoperatively.  It is noted on postop day #2 that her  hemoglobin was down a little bit further so she was given another unit  of blood to assist with that.  She was seen over the weekend and  followed very closely.  Her creatinine was followed and did improve and  it got up to 0.19 postoperatively but came back down to 0.9.  She had a  mildly elevated potassium preop on the high normal side and it did go up  to 5.2 and 5.3 were it stabilized.  She did have one elevated potassium  level of about 6 but that was a hemolyzed specimen.  Dressing change  started on day #2, incision looked good.  Incision was healing well, no  signs of infection.  She did have chronic atrial fibrillation and that  was noted to be stable.  Renal insufficiency improved.  Slow progression  with therapy through the weekend, mainly working with bed exercises and  transferring.  She was seen back on Monday morning.  Knee pain was under  good control.  She had been weaned off the PCA over to p.o. meds after  postop day #2 and tolerating her meds well.  Her renal insufficiency had  resolved.  Her creatinine was back down less than 1.  We are waiting on  a bed. Dr. Evlyn Kanner took over her care.  From medical standpoint, she  remained stable.  She was seen on rounds on August 22, 2007, on Tuesday,  doing well.  She had a rough day on  Monday, but to assistant she started  moving her bowels and was able to get some sleep on Monday night.  She  felt much better on Tuesday.  It was decided if a bed became available,  they would transfer her out.  We are waiting on bed availability at this  time.  If a bed became available we would transfer out later that day.   DISCHARGE/PLAN:  1. Possible tentative discharge today on August 22, 2007, awaiting bed      availability.  2. Discharge diagnoses:  Please see above.  3. Discharge meds.   CURRENT MEDICATIONS:  1. Amlodipine 5 mg/benazepril 20 mg p.o. daily.  2. Detrol LA 4 mg p.o. nightly.  3. Neurontin 300 mg p.o. b.i.d.  4. Ocuvite tabs p.o. daily.  5. She was on an insulin sliding scale.  6. She is on Coumadin protocol.  Please titrate the Coumadin level for      target INR between 2 and 3.  She needs to be on our protocol.      Postop DVT prophylaxis for 3 weeks then she will return to her      normal Coumadin regimen.  7. Colace 100 mg p.o. b.i.d.  8. She was taking NovoLog 3 units subcu t.i.d. with meals.  9. Bumex 1 mg p.o. at 8:00 a.m. and 6:00 p.m.  10.Ferrous sulfate 325 mg p.o. daily.  11.Cozaar 50 mg p.o. b.i.d.  12.She was also taking Lantus 26 units subcu daily.  13.MiraLax 17 gram powder in 8 ounces of water p.o. b.i.d., hold for      diarrhea.  14.Percocet 5 mg one or two every 4 hours p.r.n. pain.  15.Tylenol 325 one or two every 4-6 hours as needed for mild pain,      temperature or headache.  16.Reglan 10 mg p.o. q.8  h. p.r.n. nausea.  17.Robaxin 500 mg p.o. q.6-8 h. p.r.n. spasm.   DIET:  Diabetic medium modified carb heart-healthy diet.   ACTIVITY:  She can be weightbearing as tolerated to the right lower  extremity, bed to chair transfers with assistance, up as tolerated.  Needs to be up out of bed minimum b.i.d.  Continue exercises for range  of motion and strengthening for total knee protocol,  PT and OT.   FOLLOW UP:  She needs to follow up  with Dr. Lequita Halt in the office  approximately 2 weeks from the date of surgery in the [facility]  Office of [facility].  Please contact the office at  [phone] to help arrange appointment time and transfer this patient over  for care.  She will follow up with Dr. Ardyth Harps as instructed.  Please contact Dr. Rinaldo Cloud office for further instructions on follow-up  care.  She may start showering, however, do not submerge the incision  under water.   DISPOSITION:  Pending at this time, awaiting bed availability.   CONDITION ON DISCHARGE:  Improving.      [name], P.A.C.      Ollen Gross, M.D.  Electronically Signed    ALP/MEDQ  D:  08/22/2007  T:  08/22/2007  Job:  [id number]   cc:   Jeannett Senior A. Evlyn Kanner, M.D.  Fax: [phone]   To SNF with patient

## 2010-07-21 NOTE — Op Note (Signed)
Kathryn [name], [name]              ACCOUNT NO.:  [id number]   MEDICAL RECORD NO.:  [id number]          PATIENT TYPE:  INP   LOCATION:  1603                         FACILITY:  [facility]   PHYSICIAN:  Ollen Gross, M.D.    DATE OF BIRTH:  1934/02/29   DATE OF PROCEDURE:  08/16/2007  DATE OF DISCHARGE:                               OPERATIVE REPORT   PREOPERATIVE DIAGNOSIS:  Osteoarthritis, right knee.   POSTOPERATIVE DIAGNOSIS:  Osteoarthritis, right knee.   PROCEDURE:  Right total knee arthroplasty.   SURGEON:  Ollen Gross, M.D.   ASSISTANT:  Avel Peace, P.A.-C.   ANESTHESIA:  General with postoperative Marcaine pain pump.   BLOOD LOSS:  Minimal.   DRAINS:  None.   TOURNIQUET TIME:  41 minutes at 300 mmHg.   COMPLICATIONS:  None.   Kathryn [name] is a 77 year old female with severe end-stage arthritis,  both knees - right more symptomatic than the left.  She has failed  nonoperative management and presents for total knee arthroplasty.   PROCEDURE IN DETAIL:  After successful administration of general  anesthetic, a tourniquet was placed high on the right thigh and right  lower extremity prepped and draped in usual sterile fashion.  Extremity  was wrapped in Esmarch, knee flexed, tourniquet inflated to 300 mmHg.  Midline incision was made with a 10 blade through subcutaneous tissue to  the level of the extensor mechanism.  A fresh blade was used make a  medial parapatellar arthrotomy.  Soft tissue over the proximal medial  tibia subperiosteally elevated to the joint line with the knife and into  the semimembranosus bursa with a Cobb elevator.  Soft tissue laterally  was elevated with attention being paid to avoiding the patellar tendon  on the tibial tubercle.  The patella was subluxed laterally and knee  flexed 90 degrees, ACL and PCL removed.  Drill was used create a  starting hole in the distal femur, and the canal was thoroughly  irrigated.  A 5 degree right valgus  alignment guide was placed and  referencing off the posterior condyles rotations marked and the block  pinned to remove 11 mm off of the distal femur.  I took 11 because of  preoperative flexion contracture.  The sizing blocks placed and size 3  was most appropriate.  Rotations marked at the epicondylar axis.  Size 3  cutting block was placed and the anterior, posterior and chamfer cuts  made.   Tibia subluxed forward and the menisci were removed.  Extramedullary  tibial alignment guide was placed referencing proximally at the medial  aspect of the tibial tubercle and distally along the second metatarsal  axis and tibial crest.  The block was pinned to remove about 4 mm off of  the more deficient lateral side.  Tibial resection was made with an  oscillating saw.  Size 3 was the most appropriate tibial component, and  the proximal tibia was prepared the modular drill and keel punch for  size 3.  Femoral preparation was completed with the intercondylar cut.   Size 3 mobile bearing tibial trial, size 3 posterior stabilized  femoral  trial, and a 12.5-mm posterior stabilized rotating platform insert trial  placed.  The 12.5 showed excellent AP varus and valgus stability.  She  had stability throughout full range of motion.  She still lacked about  10 degrees from full extension.  She was a 25-degree flexion contracture  preoperatively.  I decided since she was balanced well in flexion but  tight in extension, I needed to take more distal femur.  I then removed  the trial femoral component, placed the cutting guide again for the  tibia, took 2 more millimeters off of the distal femur and redid the  chamfer cuts and box cut for the guides.  The trial 3 posterior  stabilized insert was placed again with 12.5 insert.  At this time, full  extension was achieved with excellent varus/valgus, anterior/posterior  balance throughout full range of motion.  The patella was then everted  and thickness  measured to be 23 mm.  Freehand resection was taken to 14  mm, 38 template was placed, lug holes were drilled, trial patella was  placed, and it tracked normally.  Osteophytes removed off of the  posterior femur with the trial in place.  All trials were removed and  cut bone surfaces prepared with pulsatile lavage.  Cement was mixed and  once ready for implantation a size 3 mobile bearing tibial tray, size 3  posterior stabilized femur and 38 patella were cemented into place.  The  patella was held with a clamp.  Trial 12.5 insert was placed, knee held  in full extension, and all extruded cement removed.  When the cement was  fully hardened, then the permanent 12.5-mm posterior stabilized rotating  platform insert was placed into the tibial tray.  Wounds copiously  irrigated with saline solution.  FloSeal was injected on the posterior  capsule and mediolateral gutters and suprapatellar area.  Moist sponge  was placed and tourniquet released for a total time of 41 minutes.  It  was held for 2 minutes and then removed.  Minimal bleeding was  encountered.  That bleeding which was encountered was stopped with  electrocautery.  The wound was again copiously irrigated and arthrotomy  closed with interrupted #1 PDS.  Flexion against gravity was 135  degrees.  Subcutaneous tissue was closed with interrupted 2-0 Vicryl and  then the catheter for Marcaine pain pump was placed and the pump was  initiated.  Subcuticular layer was closed with running 4-0 Monocryl.  Incision cleaned and dried and Steri-Strips and bulky sterile dressing  applied.  She was placed into a knee immobilizer, awakened and  transferred to recovery in stable condition.      Ollen Gross, M.D.  Electronically Signed     FA/MEDQ  D:  08/16/2007  T:  08/16/2007  Job:  [id number]

## 2010-07-21 NOTE — Discharge Summary (Signed)
NAMETERRICA, [name]   MEDICAL RECORD NO.:  [id number]          PATIENT TYPE:  INP   LOCATION:  1601                         FACILITY:  [facility]   PHYSICIAN:  Ollen Gross, M.D.    DATE OF BIRTH:  15-Apr-1933   DATE OF ADMISSION:  04/15/2008  DATE OF DISCHARGE:                               DISCHARGE SUMMARY   ADMITTING DIAGNOSES:  1. Osteoarthritis, left knee.  2. Mild macular degeneration.  3. Hypertension.  4. Paroxysmal atrial fibrillation.  5. Past history of elevated liver function tests secondary to      gallstones, status post gallbladder removal (normal liver function      tests on admission).  6. Past history of urinary tract infections.  7. Insulin-dependent diabetes mellitus.  8. Osteoporosis.  9. Childhood illnesses, measles, mumps.   DISCHARGE DIAGNOSES:  1. Osteoarthritis left knee status post left total knee replacement      arthroplasty.  2. Postoperative acute blood loss anemia.  3. Status post transfusion without sequelae.  4. Postoperative hyponatremia, improved.  5. Postoperative mild renal insufficiency, improved.  6. Postoperative volume overload, improved.   Remaining discharge diagnoses same as admitting diagnoses.   PROCEDURE:  April 15, 2008 left total knee, surgeon Dr. Lequita Halt,  assistant Oneida Alar, PA-C.  Spinal anesthesia.   CONSULTS:  None.   BRIEF HISTORY:  Kathryn [name] is a 77 year old female with end-stage  arthritis of the left knee, progressive worsening pain and dysfunction,  successful right total knee, now presents for left total knee.   LABORATORY DATA:  Preop CBC showed hemoglobin 12.8, hematocrit 37.4,  white cell count 5.9, platelets 182, PT/INR 19.8 and 1.6 with a PTT of  34.  Chem panel on admission:  Minimally elevated glucose of 113,  slightly elevated BUN of 29, remaining Chem panel within normal limits.  Preoperative UA:  Small leukocyte esterase, few epithelials, 7 - 10  white cells,  3 - 6 red cells, rare bacteria.  Serial CBCs were followed,  hemoglobin did drop down to 8.6 on postoperative day #1, needed blood,  post transfusion hemoglobin 10.3.  last known H and H stable at 10.3  with a crit of 29.8.  Serial ProTimes followed per Coumadin protocol,  last known PT/INR 10.1 and 1.5.  Serial BMETs were followed, sodium did  drop from 140 to 131, back up to 137, creatinine went up from a normal  level of 1.1 up to 1.4, back down to normal level of 1.08.   Chest x-ray August 10, 2007:  Two-view, borderline heart size, no acute  pulmonary findings.   EKG August 18, 2007:  Atrial fibrillation, low-voltage QRS, no old  tracings, confirmed by Dr. Lady [name].   HOSPITAL COURSE:  The patient admitted to [facility],  tolerated procedure well, later transferred to recovery room on  orthopedic floor.  Started on PCA and p.o. analgesic pain control  following surgery.  Had a fairly decent night, although hemoglobin did  drop.  She was already noted to be 8.6 postoperative.  On day 1 we did  recommend blood, she did receive  2 units.  She was also noted to be  moderately overloaded with fluids, by the morning of day 1 she was  already up 4 liters so we started doing some Lasix diuresis on the  morning of day 1.  Blood pressure was stable although it was on the  lower side.  We did hold her blood pressure medications just temporarily  until her blood pressure improved.  She is noted to have paroxysmal  atrial fibrillation, she was rate-controlled, she was on chronic  Coumadin which was restarted postoperatively.  Her sodium was down, was  felt to be due to a dilutional component so we allowed it to titrate up  on its own.  Her output was a little on the softer side so we did feel  that was associated with the lower pressures.  She did respond well to  Lasix and actually had good output.  By day 2 she was doing better,  hemoglobin was up to 10, pain was under good control.   Sodium was  already back up but she had a bump in her creatinine, we did put her  blood pressure medications on parameters and that was followed, was  improved with the diuresis, by day 3 though back down to a normal level,  a preoperative level.  Dressing was changed on day 2,  incision looked  good.  We discontinued the fluids and IVs and continued to monitor her  output.  From an orthopedic therapy standpoint she was slowly  progressing on a pivot doing a few steps in the room.  It was felt she  would benefit from undergoing skilled facility.  She was seen on the  morning of day 3, hemoglobin was stable, sodium had improved, her  creatinine also improved.  Felt she was stable from a standpoint  postoperatively and was decided be transferred to [facility] at that  time.   DISCHARGE PLAN:  Patient transferred to [facility] on April 18, 2008.   DISCHARGE DIAGNOSES:  Please see above.   DISCHARGE MEDICATIONS:  Current medications at time of transfer include:  1. Coumadin protocol, please titrate the Coumadin level for a target      INR between 2 and 3.  She needs to be on Coumadin for 3 weeks from      the date of surgery, please see bottom for Coumadin regimen in the      hospital.  2. Colace 100 mg p.o. b.i.d.  3. She is on amlodipine/benazepril 50/20 p.o. q.a.m., this was being      held for low blood pressures, although this can be resumed.  4. She is also taking Cozaar 50 mg p.o. q.p.m., resumed although      monitored for low pressures in the hospital.  5. Zocor 80 mg p.o. every other day.  6. Bumex 1 mg p.o. b.i.d.  7. Detrol LA 4 mg p.o. q.p.m.  8. Her normal insulin regimen is Humulin 70/30 twenty-five units      subcutaneous q.a.m., Humulin 70/30 ten units subcutaneous p.m. and      she also takes Byetta 5 mcg twice a day.  9. Gabapentin 300 mg one twice a day.  10.She is on Percocet 5 mg one or two every 4 hours as needed for      moderate pain.  11.Tylenol 325  one or two every 4 - 6 hours need for mild pain.  12.Robaxin 500 mg p.o. q.6 hours p.r.n. spasm.   DIET:  Diabetic, heart-healthy  diet.   ACTIVITY:  She is weightbearing as tolerated to the left lower  extremity.  Continue gait training, ambulation, ADLs, PT and OT for  total knee protocol.   FOLLOWUP:  She is followed by Dr. Lequita Halt in the office 2 weeks from the  date of surgery.  Please contact the office at [phone] to arrange  appointment over at the [facility], [facility] with Dr. Homero Fellers Aluisio approximately 2 weeks from date of  surgery, please help arrange appointment time and transfer the patient.   DISPOSITION:  [facility].   CONDITION ON DISCHARGE:  Improving.   COUMADIN REGIMEN:  The patient's INR on the morning of surgery was 1.2,  on the evening of surgery she was given apparently no Coumadin.  On  postoperative day one she was 1.3 and given a 6 mg tablet.  On  postoperative day 2 INR was 1.6 and she was given a 5 mg tablet.  On  postoperative day 3 her INR was stable at 1.5 at time of discharge.      [name], P.A.C.      Ollen Gross, M.D.  Electronically Signed    ALP/MEDQ  D:  04/18/2008  T:  04/18/2008  Job:  [id number]   cc:   Jeannett Senior A. Evlyn Kanner, M.D.  Fax: [phone]   Noralyn Pick. Eden Emms, MD, [facility]  [address]  [address]  [place]  [place] [address]   [facility]

## 2010-08-04 ENCOUNTER — Encounter: Payer: Self-pay | Admitting: Cardiovascular Disease

## 2010-08-11 ENCOUNTER — Ambulatory Visit (INDEPENDENT_AMBULATORY_CARE_PROVIDER_SITE_OTHER): Payer: Medicare Other | Admitting: *Deleted

## 2010-08-11 ENCOUNTER — Ambulatory Visit (INDEPENDENT_AMBULATORY_CARE_PROVIDER_SITE_OTHER): Payer: Medicare Other | Admitting: Cardiovascular Disease

## 2010-08-11 ENCOUNTER — Encounter: Payer: Self-pay | Admitting: Cardiovascular Disease

## 2010-08-11 DIAGNOSIS — I4891 Unspecified atrial fibrillation: Secondary | ICD-10-CM

## 2010-08-11 DIAGNOSIS — I10 Essential (primary) hypertension: Secondary | ICD-10-CM

## 2010-08-11 DIAGNOSIS — E7800 Pure hypercholesterolemia, unspecified: Secondary | ICD-10-CM

## 2010-08-11 LAB — POCT INR: INR: 3.1

## 2010-08-11 NOTE — Patient Instructions (Signed)
Your physician recommends that you schedule a follow-up appointment in: 6 MONTHS WITH DR Sh[facility]  Your physician recommends that you continue on your current medications as directed. Please refer to the Current Medication list given to you today.  Your physician has requested that you have an echocardiogram. Echocardiography is a painless test that uses sound waves to create images of your heart. It provides your doctor with information about the size and shape of your heart and how well your heart's chambers and valves are working. This procedure takes approximately one hour. There are no restrictions for this procedure. 6 MONTHS SEE DR NISHAN SAME DAY DX 427.31

## 2010-08-11 NOTE — Progress Notes (Signed)
Kathryn [name] is seen today for F/U of H'TN, edema and chronic afib on anticoagulation. . She has now had both knees replaced and is getting around well. She has mild chronic dyspnea and LE edema from her obesity and this is actually slightly improved. She has not had any bleeding problems and no palpitations Unfortunatley her husband had a spianl cord bleed from ? ?AV malformation and is paralyzed from the waste down. She obviously is devastated by this. He is still in rehab at [facility] and also has a neurogenic bladder.  ROS: Denies fever, malais, weight loss, blurry vision, decreased visual acuity, cough, sputum, SOB, hemoptysis, pleuritic pain, palpitaitons, heartburn, abdominal pain, melena, lower extremity edema, claudication, or rash.  All other systems reviewed and negative  General: Affect appropriate Healthy:  appears stated age HEENT: normal Neck supple with no adenopathy JVP normal no bruits no thyromegaly Lungs clear with no wheezing and good diaphragmatic motion Heart:  S1/S2 sytolic  murmur,rub, gallop or click PMI normal Abdomen: benighn, BS positve, no tenderness, no AAA no bruit.  No HSM or HJR Distal pulses intact with no bruits Plus one bilateral edema Neuro non-focal Skin warm and dry No muscular weakness   Current Outpatient Prescriptions  Medication Sig Dispense Refill  . amLODipine (NORVASC) 5 MG tablet Take 5 mg by mouth daily.        Marland Kitchen aspirin 81 MG tablet Take 81 mg by mouth daily.        Marland Kitchen atorvastatin (LIPITOR) 20 MG tablet Take 20 mg by mouth daily.        . benazepril (LOTENSIN) 20 MG tablet Take 20 mg by mouth daily.        . bumetanide (BUMEX) 1 MG tablet Take 1 mg by mouth daily.        . Calcium Carbonate-Vitamin D 600-400 MG-UNIT per tablet Take 1 tablet by mouth 2 (two) times daily.        . Exenatide (BYETTA 5 MCG PEN [place]) Inject into the skin 2 (two) times daily.        . ferrous sulfate 325 (65 FE) MG tablet Take 325 mg by mouth daily with breakfast.          . gabapentin (NEURONTIN) 300 MG capsule Take 300 mg by mouth 2 (two) times daily.        . insulin NPH-insulin regular (HUMULIN 70/30) (70-30) 100 UNIT/ML injection Inject into the skin. Use as directed       . losartan (COZAAR) 100 MG tablet Take 100 mg by mouth daily.        . meloxicam (MOBIC) 15 MG tablet Take 15 mg by mouth daily.        . multivitamin (THERAGRAN) per tablet Take 1 tablet by mouth daily.        . solifenacin (VESICARE) 5 MG tablet Take 10 mg by mouth daily.        Marland Kitchen warfarin (COUMADIN) 3 MG tablet Take 1 tablet (3 mg total) by mouth as directed.  105 tablet  1  . DISCONTD: amLODipine-benazepril (LOTREL) 5-20 MG per capsule Take 1 capsule by mouth daily.        Marland Kitchen DISCONTD: losartan (COZAAR) 50 MG tablet Take 50 mg by mouth daily.        Marland Kitchen DISCONTD: simvastatin (ZOCOR) 40 MG tablet Take 40 mg by mouth at bedtime.          Allergies  Review of patient's allergies indicates no known allergies.  Electrocardiogram:  Assessment  and Plan

## 2010-08-11 NOTE — Assessment & Plan Note (Signed)
Cholesterol is at goal.  Continue current dose of statin and diet Rx.  No myalgias or side effects.  F/U  LFT's in 6 months. No results found for this basename: LDLCALC             

## 2010-08-11 NOTE — Assessment & Plan Note (Signed)
Failed DCC.  Good rate control and anticoagulation

## 2010-08-11 NOTE — Assessment & Plan Note (Signed)
Well controlled.  Continue current medications and low sodium Dash type diet.    

## 2010-08-13 ENCOUNTER — Encounter: Payer: Medicare Other | Admitting: *Deleted

## 2010-09-01 ENCOUNTER — Ambulatory Visit (INDEPENDENT_AMBULATORY_CARE_PROVIDER_SITE_OTHER): Payer: Medicare Other | Admitting: *Deleted

## 2010-09-01 DIAGNOSIS — I4891 Unspecified atrial fibrillation: Secondary | ICD-10-CM

## 2010-09-24 ENCOUNTER — Ambulatory Visit (INDEPENDENT_AMBULATORY_CARE_PROVIDER_SITE_OTHER): Payer: Medicare Other | Admitting: *Deleted

## 2010-09-24 DIAGNOSIS — I4891 Unspecified atrial fibrillation: Secondary | ICD-10-CM

## 2010-09-24 LAB — POCT INR: INR: 2.8

## 2010-10-10 ENCOUNTER — Emergency Department (HOSPITAL_COMMUNITY): Payer: Medicare Other

## 2010-10-10 ENCOUNTER — Inpatient Hospital Stay (HOSPITAL_COMMUNITY)
Admission: EM | Admit: 2010-10-10 | Discharge: 2010-10-17 | DRG: 683 | Disposition: A | Discharge status: Skilled Nursing Facility | Payer: Medicare Other | Attending: Endocrinology | Admitting: Endocrinology

## 2010-10-10 ENCOUNTER — Observation Stay (HOSPITAL_COMMUNITY): Payer: Medicare Other

## 2010-10-10 DIAGNOSIS — R5381 Other malaise: Secondary | ICD-10-CM | POA: Diagnosis present

## 2010-10-10 DIAGNOSIS — Z6838 Body mass index (BMI) 38.0-38.9, adult: Secondary | ICD-10-CM

## 2010-10-10 DIAGNOSIS — D649 Anemia, unspecified: Secondary | ICD-10-CM | POA: Diagnosis present

## 2010-10-10 DIAGNOSIS — Z794 Long term (current) use of insulin: Secondary | ICD-10-CM

## 2010-10-10 DIAGNOSIS — K2970 Gastritis, unspecified, without bleeding: Secondary | ICD-10-CM | POA: Diagnosis present

## 2010-10-10 DIAGNOSIS — I4891 Unspecified atrial fibrillation: Secondary | ICD-10-CM | POA: Diagnosis present

## 2010-10-10 DIAGNOSIS — Z7982 Long term (current) use of aspirin: Secondary | ICD-10-CM

## 2010-10-10 DIAGNOSIS — Z7901 Long term (current) use of anticoagulants: Secondary | ICD-10-CM

## 2010-10-10 DIAGNOSIS — B961 Klebsiella pneumoniae [K. pneumoniae] as the cause of diseases classified elsewhere: Secondary | ICD-10-CM | POA: Diagnosis present

## 2010-10-10 DIAGNOSIS — E875 Hyperkalemia: Secondary | ICD-10-CM | POA: Diagnosis present

## 2010-10-10 DIAGNOSIS — K2990 Gastroduodenitis, unspecified, without bleeding: Secondary | ICD-10-CM | POA: Diagnosis present

## 2010-10-10 DIAGNOSIS — K5730 Diverticulosis of large intestine without perforation or abscess without bleeding: Secondary | ICD-10-CM | POA: Diagnosis present

## 2010-10-10 DIAGNOSIS — N390 Urinary tract infection, site not specified: Secondary | ICD-10-CM | POA: Diagnosis present

## 2010-10-10 DIAGNOSIS — N058 Unspecified nephritic syndrome with other morphologic changes: Secondary | ICD-10-CM | POA: Diagnosis present

## 2010-10-10 DIAGNOSIS — N179 Acute kidney failure, unspecified: Principal | ICD-10-CM | POA: Diagnosis present

## 2010-10-10 DIAGNOSIS — K648 Other hemorrhoids: Secondary | ICD-10-CM | POA: Diagnosis present

## 2010-10-10 DIAGNOSIS — E669 Obesity, unspecified: Secondary | ICD-10-CM | POA: Diagnosis present

## 2010-10-10 DIAGNOSIS — Z96659 Presence of unspecified artificial knee joint: Secondary | ICD-10-CM

## 2010-10-10 DIAGNOSIS — I10 Essential (primary) hypertension: Secondary | ICD-10-CM | POA: Diagnosis present

## 2010-10-10 DIAGNOSIS — M1990 Unspecified osteoarthritis, unspecified site: Secondary | ICD-10-CM | POA: Diagnosis present

## 2010-10-10 DIAGNOSIS — E1129 Type 2 diabetes mellitus with other diabetic kidney complication: Secondary | ICD-10-CM | POA: Diagnosis present

## 2010-10-10 DIAGNOSIS — K269 Duodenal ulcer, unspecified as acute or chronic, without hemorrhage or perforation: Secondary | ICD-10-CM | POA: Diagnosis present

## 2010-10-10 DIAGNOSIS — K644 Residual hemorrhoidal skin tags: Secondary | ICD-10-CM | POA: Diagnosis present

## 2010-10-10 LAB — BASIC METABOLIC PANEL
BUN: 146 mg/dL — ABNORMAL HIGH (ref 6–23)
CO2: 16 mEq/L — ABNORMAL LOW (ref 19–32)
Chloride: 99 mEq/L (ref 96–112)
GFR calc Af Amer: 7 mL/min — ABNORMAL LOW (ref >60)
Glucose, Bld: 154 mg/dL — ABNORMAL HIGH (ref 70–99)
Potassium: >7.5 mEq/L (ref 3.5–5.1)

## 2010-10-10 LAB — POCT I-STAT, CHEM 8
BUN: >140 mg/dL — ABNORMAL HIGH (ref 6–23)
Hemoglobin: 7.8 g/dL — ABNORMAL LOW (ref 12.0–15.0)
Potassium: 8.1 mEq/L (ref 3.5–5.1)
Sodium: 128 mEq/L — ABNORMAL LOW (ref 135–145)
TCO2: 15 mmol/L (ref 0–100)

## 2010-10-10 LAB — CBC
Platelets: 217 10*3/uL (ref 150–400)
RBC: 2.51 MIL/uL — ABNORMAL LOW (ref 3.87–5.11)
RDW: 15.2 % (ref 11.5–15.5)
WBC: 6.7 10*3/uL (ref 4.0–10.5)

## 2010-10-10 LAB — URINALYSIS, ROUTINE W REFLEX MICROSCOPIC
Glucose, UA: NEGATIVE mg/dL
Nitrite: POSITIVE — AB
Protein, ur: >300 mg/dL — AB
Urobilinogen, UA: 0.2 mg/dL (ref 0.0–1.0)

## 2010-10-10 LAB — DIFFERENTIAL
Basophils Absolute: 0 10*3/uL (ref 0.0–0.1)
Eosinophils Absolute: 0 10*3/uL (ref 0.0–0.7)
Lymphocytes Relative: 7 % — ABNORMAL LOW (ref 12–46)
Monocytes Absolute: 0.5 10*3/uL (ref 0.1–1.0)
Neutrophils Relative %: 85 % — ABNORMAL HIGH (ref 43–77)
WBC Morphology: INCREASED

## 2010-10-10 LAB — PHOSPHORUS: Phosphorus: 3.5 mg/dL (ref 2.3–4.6)

## 2010-10-10 LAB — TROPONIN I: Troponin I: <0.3 ng/mL (ref <0.30)

## 2010-10-10 LAB — HEPATIC FUNCTION PANEL
ALT: 14 U/L (ref 0–35)
AST: 19 U/L (ref 0–37)
Albumin: 3.2 g/dL — ABNORMAL LOW (ref 3.5–5.2)
Bilirubin, Direct: 0.1 mg/dL (ref 0.0–0.3)
Total Protein: 7 g/dL (ref 6.0–8.3)

## 2010-10-10 LAB — ABO/RH: ABO/RH(D): A NEG

## 2010-10-10 LAB — URINE MICROSCOPIC-ADD ON

## 2010-10-10 LAB — CK TOTAL AND CKMB (NOT AT ARMC): Total CK: 118 U/L (ref 7–177)

## 2010-10-10 LAB — SEDIMENTATION RATE: Sed Rate: 93 mm/hr — ABNORMAL HIGH (ref 0–22)

## 2010-10-10 LAB — PROTIME-INR: Prothrombin Time: 54.2 seconds — ABNORMAL HIGH (ref 11.6–15.2)

## 2010-10-11 ENCOUNTER — Observation Stay (HOSPITAL_COMMUNITY): Payer: Medicare Other

## 2010-10-11 LAB — COMPREHENSIVE METABOLIC PANEL
ALT: 12 U/L (ref 0–35)
AST: 19 U/L (ref 0–37)
Albumin: 2.4 g/dL — ABNORMAL LOW (ref 3.5–5.2)
CO2: 27 mEq/L (ref 19–32)
Calcium: 8.5 mg/dL (ref 8.4–10.5)
Chloride: 102 mEq/L (ref 96–112)
GFR calc non Af Amer: 11 mL/min — ABNORMAL LOW (ref >60)
Sodium: 138 mEq/L (ref 135–145)
Total Bilirubin: 0.2 mg/dL — ABNORMAL LOW (ref 0.3–1.2)

## 2010-10-11 LAB — IRON AND TIBC
Iron: 33 ug/dL — ABNORMAL LOW (ref 42–135)
TIBC: 190 ug/dL — ABNORMAL LOW (ref 250–470)

## 2010-10-11 LAB — MRSA PCR SCREENING: MRSA by PCR: POSITIVE — AB

## 2010-10-11 LAB — RETICULOCYTES
RBC.: 2 MIL/uL — ABNORMAL LOW (ref 3.87–5.11)
Retic Count, Absolute: 50 10*3/uL (ref 19.0–186.0)

## 2010-10-11 LAB — GLUCOSE, CAPILLARY
Glucose-Capillary: 135 mg/dL — ABNORMAL HIGH (ref 70–99)
Glucose-Capillary: 124 mg/dL — ABNORMAL HIGH (ref 70–99)
Glucose-Capillary: 171 mg/dL — ABNORMAL HIGH (ref 70–99)
Glucose-Capillary: 146 mg/dL — ABNORMAL HIGH (ref 70–99)
Glucose-Capillary: 163 mg/dL — ABNORMAL HIGH (ref 70–99)

## 2010-10-11 LAB — CBC
HCT: 18.5 % — ABNORMAL LOW (ref 36.0–46.0)
Hemoglobin: 6.4 g/dL — CL (ref 12.0–15.0)
MCV: 92.5 fL (ref 78.0–100.0)
RBC: 2 MIL/uL — ABNORMAL LOW (ref 3.87–5.11)
WBC: 5.5 10*3/uL (ref 4.0–10.5)

## 2010-10-12 DIAGNOSIS — I517 Cardiomegaly: Secondary | ICD-10-CM

## 2010-10-12 DIAGNOSIS — I4891 Unspecified atrial fibrillation: Secondary | ICD-10-CM

## 2010-10-12 DIAGNOSIS — I472 Ventricular tachycardia: Secondary | ICD-10-CM

## 2010-10-12 LAB — KAPPA/LAMBDA LIGHT CHAINS: Kappa free light chain: 6.83 mg/dL — ABNORMAL HIGH (ref 0.33–1.94)

## 2010-10-12 LAB — RENAL FUNCTION PANEL
Albumin: 2.3 g/dL — ABNORMAL LOW (ref 3.5–5.2)
BUN: 68 mg/dL — ABNORMAL HIGH (ref 6–23)
CO2: 27 mEq/L (ref 19–32)
Chloride: 101 mEq/L (ref 96–112)
Glucose, Bld: 128 mg/dL — ABNORMAL HIGH (ref 70–99)
Potassium: 4 mEq/L (ref 3.5–5.1)

## 2010-10-12 LAB — TYPE AND SCREEN
ABO/RH(D): A NEG
Unit division: 0

## 2010-10-12 LAB — POCT I-STAT 4, (NA,K, GLUC, HGB,HCT): Sodium: 139 mEq/L (ref 135–145)

## 2010-10-12 LAB — CBC
HCT: 23 % — ABNORMAL LOW (ref 36.0–46.0)
Hemoglobin: 7.9 g/dL — ABNORMAL LOW (ref 12.0–15.0)
MCH: 31.5 pg (ref 26.0–34.0)
MCV: 91.6 fL (ref 78.0–100.0)
RBC: 2.51 MIL/uL — ABNORMAL LOW (ref 3.87–5.11)

## 2010-10-12 LAB — GLUCOSE, CAPILLARY: Glucose-Capillary: 114 mg/dL — ABNORMAL HIGH (ref 70–99)

## 2010-10-12 LAB — PTH, INTACT AND CALCIUM
Calcium, Total (PTH): 8.2 mg/dL — ABNORMAL LOW (ref 8.4–10.5)
PTH: 55.2 pg/mL (ref 14.0–72.0)

## 2010-10-12 LAB — PROTIME-INR: Prothrombin Time: 36.2 seconds — ABNORMAL HIGH (ref 11.6–15.2)

## 2010-10-13 LAB — COMPREHENSIVE METABOLIC PANEL
ALT: 13 U/L (ref 0–35)
Alkaline Phosphatase: 62 U/L (ref 39–117)
CO2: 26 mEq/L (ref 19–32)
Chloride: 101 mEq/L (ref 96–112)
GFR calc Af Amer: 18 mL/min — ABNORMAL LOW (ref >60)
GFR calc non Af Amer: 15 mL/min — ABNORMAL LOW (ref >60)
Glucose, Bld: 138 mg/dL — ABNORMAL HIGH (ref 70–99)
Potassium: 4.1 mEq/L (ref 3.5–5.1)
Sodium: 137 mEq/L (ref 135–145)
Total Bilirubin: 0.3 mg/dL (ref 0.3–1.2)

## 2010-10-13 LAB — PROTEIN ELECTROPH W RFLX QUANT IMMUNOGLOBULINS
Alpha-1-Globulin: 8.4 % — ABNORMAL HIGH (ref 2.9–4.9)
Alpha-2-Globulin: 14.5 % — ABNORMAL HIGH (ref 7.1–11.8)
Beta 2: 6.3 % (ref 3.2–6.5)
Gamma Globulin: 16.3 % (ref 11.1–18.8)

## 2010-10-13 LAB — CBC
HCT: 25.2 % — ABNORMAL LOW (ref 36.0–46.0)
MCV: 92.3 fL (ref 78.0–100.0)
RBC: 2.73 MIL/uL — ABNORMAL LOW (ref 3.87–5.11)
RDW: 14.9 % (ref 11.5–15.5)
WBC: 6 10*3/uL (ref 4.0–10.5)

## 2010-10-13 LAB — URINE CULTURE: Colony Count: >=100000

## 2010-10-13 LAB — GLUCOSE, CAPILLARY: Glucose-Capillary: 222 mg/dL — ABNORMAL HIGH (ref 70–99)

## 2010-10-14 DIAGNOSIS — R195 Other fecal abnormalities: Secondary | ICD-10-CM

## 2010-10-14 DIAGNOSIS — D638 Anemia in other chronic diseases classified elsewhere: Secondary | ICD-10-CM

## 2010-10-14 LAB — GLUCOSE, CAPILLARY
Glucose-Capillary: 154 mg/dL — ABNORMAL HIGH (ref 70–99)
Glucose-Capillary: 155 mg/dL — ABNORMAL HIGH (ref 70–99)

## 2010-10-14 LAB — BASIC METABOLIC PANEL
BUN: 55 mg/dL — ABNORMAL HIGH (ref 6–23)
CO2: 28 mEq/L (ref 19–32)
Chloride: 103 mEq/L (ref 96–112)
Glucose, Bld: 137 mg/dL — ABNORMAL HIGH (ref 70–99)
Potassium: 4.3 mEq/L (ref 3.5–5.1)
Sodium: 137 mEq/L (ref 135–145)

## 2010-10-14 LAB — CBC
HCT: 26 % — ABNORMAL LOW (ref 36.0–46.0)
Hemoglobin: 8.6 g/dL — ABNORMAL LOW (ref 12.0–15.0)
MCH: 31 pg (ref 26.0–34.0)
MCV: 93.9 fL (ref 78.0–100.0)
Platelets: 192 10*3/uL (ref 150–400)
RBC: 2.77 MIL/uL — ABNORMAL LOW (ref 3.87–5.11)
WBC: 6.6 10*3/uL (ref 4.0–10.5)

## 2010-10-14 LAB — ANA: Anti Nuclear Antibody(ANA): NEGATIVE

## 2010-10-15 LAB — GLUCOSE, CAPILLARY
Glucose-Capillary: 144 mg/dL — ABNORMAL HIGH (ref 70–99)
Glucose-Capillary: 259 mg/dL — ABNORMAL HIGH (ref 70–99)
Glucose-Capillary: 118 mg/dL — ABNORMAL HIGH (ref 70–99)

## 2010-10-15 LAB — CBC
HCT: 26.2 % — ABNORMAL LOW (ref 36.0–46.0)
MCV: 93.9 fL (ref 78.0–100.0)
RBC: 2.79 MIL/uL — ABNORMAL LOW (ref 3.87–5.11)
RDW: 14.3 % (ref 11.5–15.5)
WBC: 6.2 10*3/uL (ref 4.0–10.5)

## 2010-10-15 LAB — BASIC METABOLIC PANEL
CO2: 27 mEq/L (ref 19–32)
Calcium: 9.1 mg/dL (ref 8.4–10.5)
GFR calc non Af Amer: 16 mL/min — ABNORMAL LOW (ref >60)
Glucose, Bld: 135 mg/dL — ABNORMAL HIGH (ref 70–99)
Potassium: 4.5 mEq/L (ref 3.5–5.1)
Sodium: 137 mEq/L (ref 135–145)

## 2010-10-15 LAB — PROTIME-INR: INR: 2.42 — ABNORMAL HIGH (ref 0.00–1.49)

## 2010-10-16 ENCOUNTER — Other Ambulatory Visit: Payer: Self-pay | Admitting: Internal Medicine

## 2010-10-16 LAB — BASIC METABOLIC PANEL
BUN: 43 mg/dL — ABNORMAL HIGH (ref 6–23)
Calcium: 9.3 mg/dL (ref 8.4–10.5)
Creatinine, Ser: 2.51 mg/dL — ABNORMAL HIGH (ref 0.50–1.10)
GFR calc Af Amer: 23 mL/min — ABNORMAL LOW (ref >60)
GFR calc non Af Amer: 19 mL/min — ABNORMAL LOW (ref >60)
Glucose, Bld: 133 mg/dL — ABNORMAL HIGH (ref 70–99)

## 2010-10-16 LAB — CBC
MCH: 30.8 pg (ref 26.0–34.0)
MCV: 92.8 fL (ref 78.0–100.0)
Platelets: 230 10*3/uL (ref 150–400)
RDW: 14.3 % (ref 11.5–15.5)
WBC: 6.5 10*3/uL (ref 4.0–10.5)

## 2010-10-16 LAB — PROTIME-INR: Prothrombin Time: 24.6 seconds — ABNORMAL HIGH (ref 11.6–15.2)

## 2010-10-17 LAB — PROTIME-INR: INR: 1.88 — ABNORMAL HIGH (ref 0.00–1.49)

## 2010-10-17 LAB — GLUCOSE, CAPILLARY: Glucose-Capillary: 130 mg/dL — ABNORMAL HIGH (ref 70–99)

## 2010-10-17 LAB — PREPARE FRESH FROZEN PLASMA: Unit division: 0

## 2010-10-21 ENCOUNTER — Encounter: Payer: Medicare Other | Admitting: *Deleted

## 2010-10-21 NOTE — H&P (Signed)
Kathryn [name], KIESEL NO.:  [id number]  MEDICAL RECORD NO.:  [id number]  LOCATION:  6705                         FACILITY:  [facility]  PHYSICIAN:  Barry Dienes. Eloise Harman, M.D.DATE OF BIRTH:  05/03/1933  DATE OF ADMISSION:  10/10/2010 DATE OF DISCHARGE:                             HISTORY & PHYSICAL   CHIEF COMPLAINT:  Weak and unable to stand up.  HISTORY OF PRESENT ILLNESS:  The patient is a 77 year old Caucasian woman with several medical problems who was brought to the emergency room by EMS with progressive weakness over the past few days.  Today, she was with her granddaughter who lives with her and was unable to stand up, so 911 was called to transported to the emergency room.  She has also had an approximately 50-pound weight loss over the past few months that has not been via dieting.  Generally, she gets around with a walker or wheelchair.  She has not had shortness of breath, chest pain, nausea, or vomiting.  PAST MEDICAL HISTORY:  Microscopic hematuria first diagnosed in 2011, with workup unremarkable by Dr. Isabel Caprice, chronic bacteriuria, Coumadin treatment for paroxysmal atrial fibrillation, previous elevated LFTs secondary to gallstones, exogenous obesity, diabetes mellitus type 2 with nephropathy, and she has had diabetes for over 30 years with last microalbumin in 2009, 16, and most recent serum creatinine of 1.6 in May 2011.  Hyperlipidemia, osteoarthritis of the lumbar spine and bilateral knees, hypertension.  In 1994, episode of nephrotic syndrome with hemolytic anemia, felt possibly due to ongoing sulfa drug treatment.  ALLERGIES:  Possible SULFA DRUG allergy associated with hemolytic anemia.  MEDICATIONS PRIOR TO HOSPITALIZATION: 1. ICaps 1 p.o. daily. 2. Norvasc 5 mg p.o. daily. 3. Aspirin 81 mg p.o. daily. 4. Lipitor 20 mg p.o. daily. 5. Benazepril 20 mg p.o. daily. 6. Bumex 1 mg p.o. daily. 7. Losartan 100 mg p.o. daily. 8. Mobic 15 mg  p.o. daily. 9. VESIcare 5 mg p.o. daily. 10.Coumadin 1 mg p.o. daily. 11.Amoxicillin 250 mg p.o. daily. 12.Calcium 600 mg p.o. twice daily. 13.Byetta 5 mcg subcutaneous twice daily. 14.Iron fumarate 325 mg p.o. daily. 15.Neurontin 300 mg p.o. twice daily. 16.Humulin 70/30 insulin 15 units subcutaneous q.a.m. and 5 units     subcutaneous q.p.m.  PHYSICIANS INVOLVED IN CARE: 1. Tera Mater. Evlyn Kanner, MD (primary care). 2. Melvenia Needles, MD (Ophthalmology). 3. Valetta Fuller, MD (Urology). 4. Ollen Gross, MD (Orthopedics). 5. Noralyn Pick. Eden Emms, MD, [facility] (Cardiology). 6. Artist Pais, MD (Gynecology).  PAST SURGICAL HISTORY:  Remote appendectomy, November 2002; laparoscopic cholecystectomy, June 2009; right total knee replacement, February 2010; left total knee replacement, January 2011; cystoscopy.  SOCIAL HISTORY:  She has been married since 76.  Her husband has multiple medical problems and paraplegia.  She has 1 child and her granddaughter lives with her for now, but will be going off to college in the near future.  She has no history of tobacco or alcohol abuse.  FAMILY HISTORY:  Her father died at 95 and he also had diabetes mellitus with atrial fibrillation and hypertension.  Her mother died from complications of coronary artery disease and congestive heart failure and she also had diabetes mellitus.  She has  siblings who have diabetes mellitus and hypertension and atrial fibrillation.  REVIEW OF SYSTEMS:  Notable for recent fatigue over the past few weeks and weight loss of approximately 50 pounds over the past few months. She has chronic arthralgias and had been able to get around short distances with a walker, but most recently had been using a wheelchair. She has not had recent shortness of breath or chest pain.  She has a dry cough occasionally.  She has not had nausea, constipation, dysuria, frequency, anxiety, or depression.  CURRENT PHYSICAL EXAMINATION:  VITAL  SIGNS:  Blood pressure 126/72, pulse 66, respirations 22, temperature 98.4, pulse oxygen saturation 99% on room air. GENERAL:  The patient is overweight white woman who had an occasional dry cough and had some chills with initiation of hemodialysis. HEAD, EYES, EARS, NOSE, THROAT:  Within normal limits. NECK:  Without jugular venous distention. CHEST:  Clear to auscultation. CARDIOVASCULAR:  Heart had a slightly irregular rhythm without murmur or gallop. ABDOMEN:  Normal bowel sounds and no hepatosplenomegaly or tenderness. EXTREMITIES:  Had bilateral 1+ leg edema and her pedal pulses were barely palpable bilaterally. NEUROLOGIC:  She is alert and well oriented with a normal affect.  She was able to give an excellent history.  She has a slight tremor in the right lower extremity.  INITIAL LABORATORY STUDIES:  Serum sodium 128, potassium 8.1, chloride 110, BUN greater than 140, creatinine 8.0, glucose 152.  White blood cells 6.7, hemoglobin 7.8, hematocrit 23, platelets 217, MCV 93, RDW 15 with 85% neutrophils.  Troponin I level was less than 0.30.  Stool Hemoccult test was positive.  INR level was 5.98.  She also had a chest x-ray that showed no acute disease, and a CT scan of the head without IV contrast that showed no acute abnormalities, and an EKG that showed atrial fibrillation with low voltage throughout and low precordial R- wave, suggestive of anterior wall infarction, age undetermined.  PREVIOUS LABS: 1. May 05, 2010; white blood cell 6, hemoglobin 10, hematocrit     31. 2. May 2011; BUN 50, creatinine 1.6. 3. April 2009; urine microalbumin to creatinine ratio 16, BUN 69,     creatinine 1.4. 4. December 2008; BUN 40, creatinine 1.2. 5. August 2008; BUN 58, creatinine 2.4.  IMPRESSION AND PLAN: 1. Hyperkalemia:  She has severe hyperkalemia from acute renal     insufficiency.  I agree with initiation of hemodialysis as this is     the most expeditious way to correct  this dangerous electrolyte     abnormality.  Ongoing treatment with benazepril, losartan, and     meloxicam could have contributed to what appears to be a     precipitous decline in renal function.  I doubt that she has     obstructive pathology causing a renal decline or interstitial     cystitis.  The Nephrology consultant we will be initiating a workup     laboratory studies. 2. Anemia:  This is quite severe and with minimal symptoms.  This is     most likely secondary to end-stage kidney disease, however, her     stool was Hemoccult positive in the face of an elevated INR level.     We will check serum iron, TIBC, ferritin, reticulocytes, serum     protein electrophoresis, and serum B12 level.  Eventually when the     condition stabilizes, we will consider a GI evaluation for possible     occult GI blood loss. 3.  Diabetes mellitus, type 2:  This is in stable with hemoglobin A1c     levels less than 6% and none greater than 7%.  For now, we will     treat her with sliding scale insulin as needed.          ______________________________ Barry Dienes Eloise Harman, M.D.     DGP/MEDQ  D:  10/10/2010  T:  10/10/2010  Job:  [id number]  cc:   Jeannett Senior A. Evlyn Kanner, M.D.  Electronically Signed by Jarome Matin M.D. on 10/21/2010 08:44:28 AM

## 2010-10-21 NOTE — Consult Note (Signed)
NAMEMONASIA, LAIR NO.:  [id number]  MEDICAL RECORD NO.:  [id number]  LOCATION:  6706                         FACILITY:  [facility]  PHYSICIAN:  Jesse Sans. Franko Hilliker, MD, FACCDATE OF BIRTH:  02-11-1934  DATE OF CONSULTATION:  10/12/2010 DATE OF DISCHARGE:                                CONSULTATION   PRIMARY CARE PHYSICIAN:  Tera Mater. Evlyn Kanner, MD  PRIMARY CARDIOLOGIST:  Noralyn Pick. Eden Emms, MD, [facility]  CHIEF COMPLAINT:  Atrial fibrillation, possible nonsustained VT.  HISTORY OF PRESENT ILLNESS:  Ms. Bach is a 77 year old female with a history of atrial fibrillation.  She was admitted on October 10, 2010, with acute renal failure including a potassium of 8.1 and a BUN and creatinine of greater than 140/8.  Her INR was 5.98.  Initially, her hemoglobin was just under 8, but after some hydration, her hemoglobin dropped to 6.1 with a hematocrit of 18.5.  She was transfused.  This a.m., she has some arrhythmia, that was possible nonsustained VT and Cardiology was asked to evaluate her.  Ms. Rials still complains of weakness.  Prior to admission, she had general malaise for about a week, but no specific complaints until the day of admission when she was suddenly too weak to get out of bed.  She had no nausea, vomiting, diarrhea, and no presyncope or syncope.  She has never had any palpitations and was totally unaware of the arrhythmia this morning.  Her condition is much improved since admission.  PAST MEDICAL HISTORY: 1. Permanent atrial fibrillation. 2. Chronic anticoagulation with Coumadin. 3. History of hypertension. 4. Hyperlipidemia. 5. Diabetes. 6. History of morbid obesity with weight loss down to her body mass     index of 38. 7. History of systolic ejection murmur with trivial MR. 8. Osteoarthritis.  PAST SURGICAL HISTORY:  She is status post bilateral total knee replacement, appendectomy, cholecystectomy.  ALLERGIES:  She is allergic to SULFA, which is  believed to have caused hemolytic anemia and nephrotic syndrome in the past.  CURRENT MEDICATIONS: 1. Aspirin 81 mg a day. 2. Beta-carotene with minerals daily. 3. Os-Cal b.i.d. 4. Iron daily. 5. Sliding scale insulin. 6. Multivitamin daily. 7. Bactroban b.i.d. 8. Barrier cream daily. 9. Nephro-Vite daily. 10.Coumadin which has been held.  SOCIAL HISTORY:  She lives in [place] with her granddaughter.  Her husband is in a facility because he became paralyzed.  She is retired from Engineering geologist.  She denies alcohol, tobacco, or drug abuse.  FAMILY HISTORY:  Mother died at age 4 with a history of some cardiac issues and her father died at 51, but no cardiac issues with both her father and several siblings have AFib.  REVIEW OF SYSTEMS:  She has been losing weight and her weight is down about 36 pounds in 2-1/2 years.  She has not had any recent fevers, chills, or sweats.  She has some chronic dyspnea on exertion but is not changed recently.  She never gets chest pain and has not had presyncope, syncope, or palpitations.  She has chronic arthralgias and joint pains. She denies reflux symptoms or melena.  Full 14-point review of systems is otherwise negative except as stated in the HPI.  PHYSICAL EXAMINATION:  VITAL SIGNS:  Temperature is 98.3, blood pressure 127/64, heart rate 81, respiratory rate 17, O2 saturation 95% on room air.  GENERAL:  She is a well-developed, well-nourished obese white female in no acute distress. HEENT:  Normal. NECK:  There is no lymphadenopathy, thyromegaly, bruit, or JVD noted and she has IV for dialysis access on the right. CP:  Her heart is irregular in rate and rhythm with an S1 and S2 and a systolic murmur is noted.  Distal pulses in the lower extremities are decreased. LUNGS:  She has rales in the bases, but good air exchange. SKIN:  No rashes or lesions are noted. ABDOMEN:  Soft and nontender with active bowel sounds. EXTREMITIES:  There is no  cyanosis, clubbing, or edema noted. MUSCULOSKELETAL:  There is no joint deformity or effusions and no spine or CVA tenderness. NEUROLOGIC:  She is alert and oriented.  Cranial nerves II-XII grossly intact.  Chest x-ray shows cardiomegaly without pulmonary edema.  EKG is atrial fibrillation rate 72 with no acute ischemic changes.  LABORATORY VALUES:  On admission, hemoglobin 7.9 with hematocrit of 23.3, INR 5.8.  Sodium 127, potassium 7.5, BUN 146, creatinine 7.11, it was 8.01 on recheck, hemoglobin decreased to 6.1 with a hematocrit of 16, but her current hemoglobin is 7.9 with hematocrit of 23, WBC is 5.3, platelets 162, INR now 3.57.  Sodium 136, potassium 4.0, chloride 101, CO2 of 27, BUN 68, creatinine 3.43, glucose 128, albumin 2.3, hemoglobin A1c 5.8, iron 33.  Urinalysis positive for many bacteria, urine culture greater than 100,000 colonies gram-negative rods.  IMPRESSION:  Ms. Verno was seen today by Dr. Daleen Squibb, the patient evaluated and the data reviewed.  Upon strip review, she had a 5 beat run of wide complex tachycardia and then the episode this morning where she had wide complex tachycardia at a rate of about 120 that was a irregular and was 8 beats long.  She then had another run that was extremely variable in rate, but generally between 100 and 175 about 5 seconds later.  Both ended spontaneously.  There is otherwise no significant arrhythmia on telemetry, although she has underlying AFib with a controlled rate.  We suspect she is in chronic atrial fibrillation with a rate induced bundle-branch block which was asymptomatic.  This could be secondary to anemia which was severe and electrolyte abnormalities as well as fluid shifts.  RECOMMENDATIONS: 1. A 2-D echocardiogram to help with medical management and fluid     management. 2. Treatment decision regarding beta blockers after her volume status     has equilibrated and we know the echo results. 3. Care management  consult for this high-risk patient to assess her     and follow her as an outpatient. 4. Hold Coumadin for now with a GI evaluation recommended before     Coumadin is reinitiated.     Theodore Demark, PA-C   ______________________________ Jesse Sans Daleen Squibb, MD, [facility]    RB/MEDQ  D:  10/12/2010  T:  10/13/2010  Job:  [id number]  Electronically Signed by Theodore Demark PA-C on 10/15/2010 02:44:27 PM Electronically Signed by Valera Castle MD [facility] on 10/21/2010 10:39:41 AM

## 2010-10-22 ENCOUNTER — Encounter: Payer: Self-pay | Admitting: Internal Medicine

## 2010-10-23 NOTE — Discharge Summary (Signed)
NAMELATAMARA, MELDER NO.:  [id number]  MEDICAL RECORD NO.:  [id number]  LOCATION:  6706                         FACILITY:  [facility]  PHYSICIAN:  Geoffry Paradise, M.D.  DATE OF BIRTH:  1934-01-09  DATE OF ADMISSION:  10/10/2010 DATE OF DISCHARGE:  10/16/2010                              DISCHARGE SUMMARY   DIAGNOSES AT THE TIME OF DISCHARGE: 1. Subacute renal failure requiring temporary dialysis. 2. Chronic atrial fibrillation prior to this hospitalization on     Coumadin. 3. Essential hypertension. 4. Hyperlipidemia. 5. Diabetes mellitus type 2 on sliding scale and diet. 6. Anemia secondary to presumed GI blood loss heme-positive stools.     Colonoscopy endoscopy pending as of this dictation. 7. Klebsiella urinary tract infection sensitive to cephalosporins. 8. Nonsustained ventricular ectopy/V-tach.  HISTORY OF PRESENT ILLNESS:  Ms. Jenniges is a pleasant 77 year old patient of Dr. Rinaldo Cloud who presented to the emergency room and was admitted by our partner Dr. Jarold Motto with weakness and inability to stand.  She has multiple medical problems to include diabetes mellitus type 2, chronic atrial fibrillation, essential hypertension and diabetes mellitus type 2 on insulin Byetta.  She evidently was with her granddaughter was unable to stand therefore EMS was summoned and she was brought to the emergency room.  She is noted to have a 50-pound weight loss over the last several months, but has been dieting.  She generally gets around with a walker or wheelchair, has had no chest pain or shortness of breath.  In the emergency room she had multiple metabolic derangements to include hyperkalemia and acute renal failure, requiring emergent temporary dialysis in admission for further management.  She was also noted to have fairly significant anemia and is on Coumadin with a positive stool for heme occult blood.  Therefore, she was admitted for further management.  For  details see the dictated summary by Dr. Jarome Matin.  DATA:  The renal ultrasound mild left hydronephrosis, otherwise unremarkable.  Chest x-ray right IJ line, no acute disease.  CT brain no acute findings.  INITIAL CHEMISTRY:  Sodium 128, potassium was 8.1, chloride 110, glucose 152, BUN greater than 140, creatinine was 8.0.  Discharge BMET October 16, 2010 sodium 135, potassium 4.2, chloride 99, CO2 25, glucose 133, BUN 43, creatinine 2.51, calcium 9.3.  PT/INR prior to discharge was 2.18 and that is with Coumadin having been held drifting downward. Initial PT/INR was up to 5.98.  Liver functions bilirubin 0.2, alk phos 74, SGOT 19, SGPT 14, protein 7, albumin is 3.2. CK 118, MB 5.4, troponin less than 0.3.  Occult blood is positive.  Sedimentation rate is 93.  Urinalysis specific gravity 1.011, pH 6.5, bilirubin negative, protein greater than 300, nitrate positive, leukocytes large.  Hepatitis core antibody negative, hepatitis B surface antigen negative.  CBC; hemoglobin was 6.4, hematocrit was 18.5, MCV 92.5, white blood counts is 5.5.  Following transfusion and prior to discharge CBC; hemoglobin 9.9, hematocrit 29.8, white blood count is 6.5.  ANA is negative.  Protein electrophoresis nonspecific pattern.  No evidence of monoclonal spike. TSH was 0.692.  Urine culture greater than 100,000 colonies of Klebsiella resistant to ampicillin, sensitive to cefazolin and ceftriaxone, resistant  to Cipro, C4 compliment normal at 28, C3 compliment normal at 118, kappa/lambda light chains both elevated 6.83, 7.03 respectively with a ratio 0.97.  Hemoglobin A1c was 5.8. Echocardiogram done on February 6 left ventricle normal size LVH, EF 60%- 65% normal wall motion, left atrium mildly dilated, normal aortic root, no pericardial effusions.  HOSPITAL COURSE:  The patient was admitted to the Medical Service did have dialysis catheter placed in the right IJ given her hyperkalemia and renal  failure was acutely dialyzed, subsequent to which dialysis had been discontinued and renal function slowly drifted downward.  Dialysis was conducted on October 10, 2010 and this was the only dialysis conducted.  As you can see above her renal function has gradually normalized.  From a cardiac standpoint she did have some nonsustained ventricular ectopy, but most part of her atrial fibrillation was controlled very well on low-dose beta blocker with no further ectopy. Cardiology had been consulted and enzymes were negative.  Echocardiogram was unremarkable.  The medication adjustment include discontinuation of ARBs while the inciting etiology for her renal failure remains unclear it may indeed have been medication related, therefore A/ARB should be avoided.  She did have a Klebsiella UTI treated initially with Rocephin converted to Keflex.  Finally, Coumadin has been held, INR drifting downward, FFP given on the morning of this discharge in anticipation of a GI workup to include endoscopy and colonoscopy given the fairly profound anemia requiring transfusion.  We will hold further decisions on anticoagulation pending the results of this endoscopy and colonoscopy.  In short, from a metabolic standpoint the acute superimposed on chronic renal failure remains unclear whether this was precipitated by ARB/ACE or volume issues related to her hemoglobin remains unclear, but this has subsequently stabilized and is gradually improving.  While it is certainly in her best interest remain anticoagulated for her atrial fibrillation this has been held pending GI workup, which is being conducted at the time of this dictation and further can be discerned following this.  Her diabetes has been controlled at this point on sliding scale insulin.  Ultimately prior to this she has been on b.i.d. mixed insulin and Byetta.  She had been well-controlled from a hypertensive standpoint and atrial fibrillation  standpoint with minimal ventricular ectopy on low-dose beta blocker.  Echocardiogram has been unremarkable.  She is eating well, but remains a bit deconditioned will need transfer to skilled nursing following colonoscopy this morning.  DISCHARGE MEDICATIONS: 1. Tylenol 650 q. 6. p.r.n. 2. Aspirin 81 mg daily. 3. Ocuvite one daily. 4. Calcium carbonate 500 b.i.d. 5. Cephalexin 500 b.i.d. for the Klebsiella UTI x7 days. 6. Ferrous sulfate 325 daily. 7. NovoLog insulin per sliding scale. 8. Metoprolol 12.5 b.i.d. 9. Nephro-Vite one daily. 10.Sorbitol 70% 30 mL as needed for constipation keeping in mind she     is just been cleaned out and zolpidem 5 mg p.o. at bedtime p.r.n.     insomnia.  We do anticipate the potential restart need of her 70/30 insulin or Lantus, but for now she had been well controlled on sliding scale alone. We continued recommend holding off on any nonsteroidals or ARBs as she had been on meloxicam and losartan precipitating this episode.  Finally, decision related to anticoagulation will be pending her endoscopy and colonoscopy today.  We anticipate discharge to skilled nursing following that.          ______________________________ Geoffry Paradise, M.D.     RA/MEDQ  D:  10/16/2010  T:  10/16/2010  Job:  [id number]  Electronically Signed by Geoffry Paradise M.D. on 10/23/2010 10:50:40 AM

## 2010-10-26 NOTE — Consult Note (Signed)
Kathryn [name], COUSINEAU NO.:  [id number]  MEDICAL RECORD NO.:  [id number]  LOCATION:  7035                         FACILITY:  [facility]  PHYSICIAN:  Windy Kalata, M.D.DATE OF BIRTH:  [birth year]  DATE OF CONSULTATION:  10/10/2010 DATE OF DISCHARGE:                                CONSULTATION   REFERRING PHYSICIAN:  Dr. Sharlett Iles.  REASON FOR CONSULTATION:  Acute renal failure and hyperkalemia.  HISTORY OF PRESENT ILLNESS:  This is a 77 year old white female who presented to the emergency room today with complaints of weakness for the last several weeks, though certainly worse today where she could barely stand up.  In addition, she has had decreased appetite for months and has had significant weight loss over the last several months although she is unable to tell me exactly how much weight.  Overall, she has had poor p.o. intake.  Laboratory done in the emergency room showed a serum creatinine of 7.1, potassium greater than 7.5.  She denies any history of kidney problems, although according to Dr. Shon Baton note, she had episode of nephrotic syndrome with hemolytic anemia back in 1994.  In addition, she has had some episodes of gross microscopic hematuria for which she has been seen by urologist with a negative cystoscopy.  She says she most recently had an episode of gross hematuria last week and was placed on amoxicillin.  She denies any history of kidney stones.  She has been on Meloxicam on a daily basis along with benazepril and losartan.  In terms of renal function, her serum creatinine on February 2010 was 1.0 and May 2011 was 1.6.  She has had several falls in the past few months.  PAST MEDICAL HISTORY: 1. History significant for diabetes for at least 23 years. 2. Chronic atrial fib on Coumadin. 3. Hypertension. 4. Status post appendectomy. 5. Status post cholecystectomy. 6. History of bilateral knee replacements. 7. History of nephrotic  syndrome with hemolytic anemia in 1994 thought     possibly related to sulfa.  ALLERGIES:  SULFA possibly caused hemolytic anemia.  MEDICATIONS: 1. Meloxicam 50 mg a day. 2. Amlodipine 5 mg a day. 3. Benazepril 20 mg a day. 4. Coumadin 1 mg a day. 5. Insulin 70/30 15 units a.m., 5 units p.m. 6. Byetta 5 mg b.i.d. 7. Lipitor 20 mg a day. 8. Losartan 100 mg a day. 9. Bumex 1 mg a day. 10.Aspirin 81 mg a day. 11.Vesicare 5 mg a day. 12.Amoxicillin 500 mg a day. 13,  Neurontin 300 mg b.i.d.  SOCIAL HISTORY:  She is a nonsmoker, nondrinker.  She is married and her granddaughter lives with her.  Her husband has moved into a nursing home.  She used to work in [employer] research (medical).  FAMILY HISTORY:  Mother had diabetes and she died of complications from a fall.  Father died last year at the age of 101.  No family history of renal disease.  REVIEW OF SYSTEMS:  Appetite is poor.  Energy level poor.  She denies any shortness of breath, PND, orthopnea.  Denies any chest pains or chest pressures.  Denies any recent change in bowel habits.  No dysuria. She does have chronic low back  pain.  No recent change in vision.  No new skin rashes, though she has some ecchymosis from falls.  Rest of the review of systems are unremarkable.  PHYSICAL EXAMINATION:  VITAL SIGNS:  Blood pressure 130/71, pulse 85, temperature 98.4. GENERAL:  A 76 year old white female in no acute distress. HEENT:  Sclera nonicteric.  Extraocular muscles are intact. NECK:  No JVD. LUNGS:  Clear to auscultation. HEART:  Irregularly irregular with a 2/6 systolic murmur at the left sternal border. ABDOMEN:  Positive bowel sounds, nontender, nondistended.  No hepatosplenomegaly. EXTREMITIES:  No clubbing, cyanosis or edema.  There is an ecchymosis of her right thigh and knee.  Pulses are 2/4 in the carotids, radials, femorals, 1/4 in dorsalis pedis and posterior tibial. BACK:  Ecchymosis in her mid-back. NEUROLOGIC:  Cranial nerves  intact.  Motor and sensory intact.  Positive asterixis and mild myoclonus.  LABORATORY DATA:  Sodium 127, potassium greater than 7.5, bicarb 16, BUN 146, creatinine 7.1, calcium 10.1, albumin 3.2.  Hemoglobin 7.9, white count 6.7, platelet count 217,000.  IMPRESSION: 1. Renal failure acute versus chronic versus subacute.  I suspect this     is subacute and has been on for a number of weeks.  Though she is     on an ACE, ARB, meloxicam, and may be on the volume depleted side,     I still suspect multiple myeloma as a possibility given her anemia,     age, and renal failure. 2. Hyperkalemia. 3. Atrial fibrillation. 4. Anemia. 5. Diabetes. 6. Hypertension.  PLAN:  I will place a hemodialysis catheter and do hemodialysis emergently.  We will check serum protein electrophoresis, free light chains, urine protein electrophoresis, urinalysis, C3, C4, ANCA, Anti- GBM, ANA, and sed rate.  We will discontinue ACE inhibitor and ARB, discontinue meloxicam.  We will continue her Norvasc.  We will check a phosphorus level.  We will check PTH level.  We will check iron studies. We will place a Foley catheter, place her on a renal diet.  We will follow her potassium intradialytically and plan to repeat renal profile in the morning.  Thank you very much for the consult.  We will follow up the patient with you.          ______________________________ Windy Kalata, M.D.     MTM/MEDQ  D:  10/10/2010  T:  10/10/2010  Job:  [id number]  Electronically Signed by Fleet Contras M.D. on 10/26/2010 11:07:36 AM

## 2010-10-27 ENCOUNTER — Encounter: Payer: Self-pay | Admitting: Internal Medicine

## 2010-11-10 ENCOUNTER — Encounter: Payer: Self-pay | Admitting: Internal Medicine

## 2010-11-15 ENCOUNTER — Emergency Department (HOSPITAL_COMMUNITY)
Admission: EM | Admit: 2010-11-15 | Discharge: 2010-11-15 | Disposition: A | Discharge status: Home / Self Care | Payer: Medicare Other | Attending: Emergency Medicine | Admitting: Emergency Medicine

## 2010-11-15 DIAGNOSIS — R319 Hematuria, unspecified: Secondary | ICD-10-CM | POA: Insufficient documentation

## 2010-11-15 DIAGNOSIS — E119 Type 2 diabetes mellitus without complications: Secondary | ICD-10-CM | POA: Insufficient documentation

## 2010-11-15 DIAGNOSIS — N3090 Cystitis, unspecified without hematuria: Secondary | ICD-10-CM | POA: Insufficient documentation

## 2010-11-15 DIAGNOSIS — D649 Anemia, unspecified: Secondary | ICD-10-CM | POA: Insufficient documentation

## 2010-11-15 DIAGNOSIS — I4891 Unspecified atrial fibrillation: Secondary | ICD-10-CM | POA: Insufficient documentation

## 2010-11-15 DIAGNOSIS — I10 Essential (primary) hypertension: Secondary | ICD-10-CM | POA: Insufficient documentation

## 2010-11-15 LAB — CBC
MCH: 31.8 pg (ref 26.0–34.0)
MCHC: 34.3 g/dL (ref 30.0–36.0)
MCV: 92.6 fL (ref 78.0–100.0)
Platelets: 278 10*3/uL (ref 150–400)
RDW: 14.5 % (ref 11.5–15.5)

## 2010-11-15 LAB — DIFFERENTIAL
Eosinophils Absolute: 0.1 10*3/uL (ref 0.0–0.7)
Eosinophils Relative: 1 % (ref 0–5)
Lymphs Abs: 0.8 10*3/uL (ref 0.7–4.0)
Monocytes Absolute: 0.7 10*3/uL (ref 0.1–1.0)
Monocytes Relative: 8 % (ref 3–12)

## 2010-11-15 LAB — URINALYSIS, ROUTINE W REFLEX MICROSCOPIC
Glucose, UA: NEGATIVE mg/dL
Nitrite: NEGATIVE
Protein, ur: 100 mg/dL — AB
Urobilinogen, UA: 0.2 mg/dL (ref 0.0–1.0)

## 2010-11-15 LAB — BASIC METABOLIC PANEL
BUN: 43 mg/dL — ABNORMAL HIGH (ref 6–23)
Calcium: 10.7 mg/dL — ABNORMAL HIGH (ref 8.4–10.5)
Creatinine, Ser: 1.81 mg/dL — ABNORMAL HIGH (ref 0.50–1.10)
GFR calc Af Amer: 33 mL/min — ABNORMAL LOW (ref >60)

## 2010-11-15 LAB — URINE MICROSCOPIC-ADD ON

## 2010-11-15 LAB — APTT: aPTT: 31 seconds (ref 24–37)

## 2010-11-17 LAB — URINE CULTURE

## 2010-12-03 ENCOUNTER — Ambulatory Visit: Payer: Medicare Other | Admitting: Internal Medicine

## 2010-12-03 LAB — CBC
HCT: 22.2 — ABNORMAL LOW
HCT: 24.2 — ABNORMAL LOW
HCT: 26 — ABNORMAL LOW
HCT: 32.2 — ABNORMAL LOW
Hemoglobin: 11.3 — ABNORMAL LOW
Hemoglobin: 8.4 — ABNORMAL LOW
MCHC: 34.7
MCHC: 35
MCHC: 35
MCV: 92.7
MCV: 92.9
MCV: 94.7
MCV: 94.8
Platelets: 126 — ABNORMAL LOW
Platelets: 128 — ABNORMAL LOW
Platelets: 163
Platelets: 203
RBC: 2.56 — ABNORMAL LOW
RBC: 2.77 — ABNORMAL LOW
RBC: 3.4 — ABNORMAL LOW
RDW: 13.6
RDW: 14.6
RDW: 15.2
WBC: 5.7
WBC: 6.5
WBC: 6.9
WBC: 7.2

## 2010-12-03 LAB — BASIC METABOLIC PANEL
BUN: 41 — ABNORMAL HIGH
BUN: 47 — ABNORMAL HIGH
BUN: 49 — ABNORMAL HIGH
BUN: 61 — ABNORMAL HIGH
BUN: 34 — ABNORMAL HIGH
BUN: 36 — ABNORMAL HIGH
CO2: 21
CO2: 24
CO2: 23
CO2: 24
Calcium: 8.1 — ABNORMAL LOW
Calcium: 8.3 — ABNORMAL LOW
Calcium: 8.4
Calcium: 8.7
Chloride: 100
Chloride: 111
Chloride: 99
Chloride: 105
Creatinine, Ser: 1.18
Creatinine, Ser: 1.49 — ABNORMAL HIGH
Creatinine, Ser: 0.98
Creatinine, Ser: 0.99
Creatinine, Ser: 0.99
GFR calc Af Amer: 27 — ABNORMAL LOW
GFR calc Af Amer: 30 — ABNORMAL LOW
GFR calc Af Amer: 54 — ABNORMAL LOW
GFR calc Af Amer: >60
GFR calc non Af Amer: 33 — ABNORMAL LOW
GFR calc non Af Amer: 34 — ABNORMAL LOW
GFR calc non Af Amer: 55 — ABNORMAL LOW
GFR calc non Af Amer: 55 — ABNORMAL LOW
Glucose, Bld: 143 — ABNORMAL HIGH
Glucose, Bld: 127 — ABNORMAL HIGH
Glucose, Bld: 134 — ABNORMAL HIGH
Glucose, Bld: 196 — ABNORMAL HIGH
Potassium: 5
Potassium: 5.2 — ABNORMAL HIGH
Potassium: 5.2 — ABNORMAL HIGH
Potassium: 5.3 — ABNORMAL HIGH
Sodium: 130 — ABNORMAL LOW
Sodium: 133 — ABNORMAL LOW

## 2010-12-03 LAB — COMPREHENSIVE METABOLIC PANEL WITH GFR
ALT: 21
AST: 27
Albumin: 3.4 — ABNORMAL LOW
Alkaline Phosphatase: 81
BUN: 62 — ABNORMAL HIGH
CO2: 28
Calcium: 9.8
Chloride: 105
Creatinine, Ser: 1.53 — ABNORMAL HIGH
GFR calc non Af Amer: 33 — ABNORMAL LOW
Glucose, Bld: 113 — ABNORMAL HIGH
Potassium: 5.1
Sodium: 140
Total Bilirubin: 0.9
Total Protein: 6.5

## 2010-12-03 LAB — PREPARE RBC (CROSSMATCH)

## 2010-12-03 LAB — TYPE AND SCREEN
ABO/RH(D): A NEG
Antibody Screen: NEGATIVE

## 2010-12-03 LAB — PROTIME-INR
INR: 1.5
INR: 2.4 — ABNORMAL HIGH
INR: 2.6 — ABNORMAL HIGH
INR: 2.7 — ABNORMAL HIGH
Prothrombin Time: 26.6 — ABNORMAL HIGH
Prothrombin Time: 28.5 — ABNORMAL HIGH
Prothrombin Time: 29.4 — ABNORMAL HIGH
Prothrombin Time: 24.7 — ABNORMAL HIGH

## 2010-12-03 LAB — URINALYSIS, ROUTINE W REFLEX MICROSCOPIC
Bilirubin Urine: NEGATIVE
Hgb urine dipstick: NEGATIVE
Ketones, ur: NEGATIVE
Nitrite: NEGATIVE
Protein, ur: NEGATIVE
Specific Gravity, Urine: 1.022
Urobilinogen, UA: 0.2

## 2010-12-03 LAB — CARDIAC PANEL(CRET KIN+CKTOT+MB+TROPI)
CK, MB: 5.1 — ABNORMAL HIGH
Total CK: 396 — ABNORMAL HIGH

## 2010-12-03 LAB — URINE MICROSCOPIC-ADD ON

## 2010-12-24 ENCOUNTER — Encounter: Payer: Self-pay | Admitting: Internal Medicine

## 2011-01-01 ENCOUNTER — Ambulatory Visit (INDEPENDENT_AMBULATORY_CARE_PROVIDER_SITE_OTHER): Payer: Medicare Other | Admitting: Internal Medicine

## 2011-01-01 ENCOUNTER — Encounter: Payer: Self-pay | Admitting: Internal Medicine

## 2011-01-01 ENCOUNTER — Other Ambulatory Visit (INDEPENDENT_AMBULATORY_CARE_PROVIDER_SITE_OTHER): Payer: Medicare Other

## 2011-01-01 VITALS — BP 112/70 | HR 88 | Ht 70.0 in | Wt 238.0 lb

## 2011-01-01 DIAGNOSIS — R110 Nausea: Secondary | ICD-10-CM

## 2011-01-01 DIAGNOSIS — D649 Anemia, unspecified: Secondary | ICD-10-CM

## 2011-01-01 LAB — COMPREHENSIVE METABOLIC PANEL
BUN: 27 mg/dL — ABNORMAL HIGH (ref 6–23)
CO2: 28 mEq/L (ref 19–32)
Calcium: 10.9 mg/dL — ABNORMAL HIGH (ref 8.4–10.5)
Chloride: 100 mEq/L (ref 96–112)
Creatinine, Ser: 1.5 mg/dL — ABNORMAL HIGH (ref 0.4–1.2)
GFR: 35.37 mL/min — ABNORMAL LOW (ref >60.00)
Total Bilirubin: 0.5 mg/dL (ref 0.3–1.2)

## 2011-01-01 LAB — CBC WITH DIFFERENTIAL/PLATELET
Basophils Absolute: 0 10*3/uL (ref 0.0–0.1)
Basophils Relative: 0.6 % (ref 0.0–3.0)
Eosinophils Relative: 2.3 % (ref 0.0–5.0)
HCT: 28 % — ABNORMAL LOW (ref 36.0–46.0)
Hemoglobin: 9.5 g/dL — ABNORMAL LOW (ref 12.0–15.0)
Lymphocytes Relative: 10.9 % — ABNORMAL LOW (ref 12.0–46.0)
Lymphs Abs: 0.7 10*3/uL (ref 0.7–4.0)
Monocytes Relative: 9.7 % (ref 3.0–12.0)
Neutro Abs: 4.7 10*3/uL (ref 1.4–7.7)
RBC: 2.91 Mil/uL — ABNORMAL LOW (ref 3.87–5.11)
RDW: 15.6 % — ABNORMAL HIGH (ref 11.5–14.6)
WBC: 6.1 10*3/uL (ref 4.5–10.5)

## 2011-01-01 LAB — IBC PANEL
Saturation Ratios: 9.9 % — ABNORMAL LOW (ref 20.0–50.0)
Transferrin: 180 mg/dL — ABNORMAL LOW (ref 212.0–360.0)

## 2011-01-01 MED ORDER — PANTOPRAZOLE SODIUM 40 MG PO TBEC: 40.0000 mg | DELAYED_RELEASE_TABLET | Freq: Every day | ORAL | Status: DC

## 2011-01-01 MED ORDER — ONDANSETRON 4 MG PO TBDP: 4.0000 mg | ORAL_TABLET | Freq: Four times a day (QID) | ORAL | Status: AC | PRN

## 2011-01-01 NOTE — Progress Notes (Signed)
Subjective:    Patient ID: Kathryn [name], female    DOB: 01/13/1934, 77 y.o.   MRN: [id number]  HPI Kathryn [name] is a 77 year old female with a PMH of atrial for ablation, hypertension, diabetes, hypercholesterolemia, osteoporosis who seen in hospital followup. The patient was initially seen by our GI consult service in August 2012 for evaluation of significant anemia with heme positive stools. She underwent an upper and lower endoscopy in the hospital. Upper endoscopy was notable only for mild gastritis which was H. pylori negative on biopsy she did have some mild blood nidus with superficial ulceration and erosion. Colonoscopy revealed sigmoid diverticulosis along with internal and external hemorrhoids, but was otherwise normal. She did receive blood transfusion in the hospital and her warfarin therapy was stopped.  So slowly she was sent to a skilled nursing facility for rehabilitation, and she remains there. She reports that she is doing better and getting stronger, but she estimates at least 2 more weeks in rehabilitation.  She is hoping they'll return home. Since being in rehabilitation she has been treated for pneumonia as well as a significant urinary tract infection with hematuria. She is having some early morning nausea which does not seem to relate to eating. She very rarely has trouble with vomiting. She reports her appetite is okay but she is down the food at the skilled nursing facility to be barely tolerable. She remains on oral twice daily iron. She denies bright red blood per rectum or melena, though she notes her stools are dark with iron therapy. She is no longer having trouble with constipation but she is on MiraLAX daily. She feels that her nausea is related to taking her medication on empty stomach.  She also notes that her blood counts have been followed while in rehabilitation and to the best of her knowledge her blood counts, specifically her red cell count, has not increased as  they had hoped.  Review of Systems Constitutional: Negative for fever, chills, night sweats, see HPI HEENT: Negative for sore throat, mouth sores and trouble swallowing. Eyes: Negative for visual disturbance Respiratory: Negative for cough, chest tightness and shortness of breath Cardiovascular: Negative for chest pain, palpitations, positive for mild lower extremity swelling Gastrointestinal: See history of present illness Genitourinary: Negative for dysuria and hematuria currently Musculoskeletal: Negative for back pain, arthralgias and myalgias Skin: Negative for rash or color change Neurological: Negative for headaches, weakness, numbness Hematological: Negative for adenopathy, negative for easy bruising/bleeding Psychiatric/behavioral: Negative for depressed mood, negative for anxiety   Patient Active Problem List  Diagnoses  . DM  . HYPERCHOLESTEROLEMIA  IIA  . MACULAR DEGENERATION  . HYPERTENSION, UNSPECIFIED  . ATRIAL FIBRILLATION  . OSTEOARTHRITIS  . OSTEOPOROSIS  . EDEMA   Current Outpatient Prescriptions  Medication Sig Dispense Refill  . aspirin 81 MG tablet Take 81 mg by mouth daily.        Marland Kitchen atorvastatin (LIPITOR) 20 MG tablet Take 20 mg by mouth daily.        . B Complex-C-Folic Acid (NEPHRO-VITE PO) Take 1 capsule by mouth daily.        . bumetanide (BUMEX) 1 MG tablet Take 1 mg by mouth daily.        . Calcium Carbonate-Vitamin D 600-400 MG-UNIT per tablet Take 1 tablet by mouth 2 (two) times daily.        . ferrous sulfate 325 (65 FE) MG tablet Take 325 mg by mouth daily with breakfast.        .  insulin NPH-insulin regular (HUMULIN 70/30) (70-30) 100 UNIT/ML injection Inject into the skin. Use as directed       . losartan (COZAAR) 100 MG tablet Take 100 mg by mouth daily.        . meloxicam (MOBIC) 15 MG tablet Take 15 mg by mouth daily.        . multivitamin (THERAGRAN) per tablet Take 1 tablet by mouth daily.        . ondansetron (ZOFRAN ODT) 4 MG  disintegrating tablet Take 1 tablet (4 mg total) by mouth every 6 (six) hours as needed for nausea.  60 tablet  2  . pantoprazole (PROTONIX) 40 MG tablet Take 1 tablet (40 mg total) by mouth daily.  30 tablet  3  . solifenacin (VESICARE) 5 MG tablet Take 10 mg by mouth daily.        . sorbitol 70 % SOLN Take by mouth daily as needed.        . zolpidem (AMBIEN) 5 MG tablet Take 5 mg by mouth at bedtime as needed.         No Known Allergies  Social History  . Marital Status: Married    Number of Children: 1   Social History Main Topics  . Smoking status: Never Smoker   . Smokeless tobacco: Never Used  . Alcohol Use: No  . Drug Use: No  --See HPI.  Positive is also and skilled nursing, and he is paralyzed from the waist down due to spinal cord injury about a year ago  Family History  Problem Relation Age of Onset  . Diabetes Father   . Atrial fibrillation Father   . Breast cancer Paternal Grandmother   . Heart disease Mother       Objective:   Physical Exam BP 112/70  Pulse 88  Ht 5\' 10"  (1.778 m)  Wt 238 lb (107.956 kg)  BMI 34.15 kg/m2 Constitutional: Well-developed and well-nourished. No distress. HEENT: Normocephalic and atraumatic. Oropharynx is clear and moist. No oropharyngeal exudate. Conjunctivae are pale, Pupils are equal round and reactive to light. No scleral icterus. Neck: Neck supple. Trachea midline. Cardiovascular: Irregularly irregular with intact distal pulses.  Pulmonary/chest: Effort normal and breath sounds normal. No wheezing, rales or rhonchi. Abdominal: Soft, obese, nontender, nondistended. Bowel sounds active throughout. Extremities: 1+ lower extremity edema, no cyanosis, or clubbing Lymphadenopathy: No cervical adenopathy noted. Neurological: Alert and oriented to person place and time. Skin: Skin is warm and dry. No rashes noted. Psychiatric: Normal mood and affect. Behavior is normal.  Labs obtained 12/17/2010 WBC 5.6, hemoglobin 8.6, hematocrit  26.7, MCV 96.0, platelet count 209 --There are notes on the lab sheet indicating a hemoglobin of 8.9 on 12/10/2010, and hemoglobin of 8.3 on 12/03/2010    Assessment & Plan:   77 year old female with a PMH of atrial for ablation, hypertension, diabetes, hypercholesterolemia, osteoporosis who seen in hospital followup with some nausea and ongoing anemia.  1. Nausea -- the patient recently underwent upper endoscopy which revealed mild gastritis and duodenitis. She is not currently on PPI therapy, and therefore I recommend that she start pantoprazole 40 mg daily. Her nausea is mild at present and not associated with significant vomiting. I recommend symptomatic control and have prescribed ondansetron 4 mg sublingual every 6 hours when necessary nausea. I also recommended she should take her medications with food if she feels this is helpful.  2. Anemia -- the patient has had upper and lower endoscopy, and she was found to have gastritis/duodenitis  which could explain heme positive stool. Her MCV is normal, and I'm not convinced that her anemia is slowly iron deficiency or from chronic blood loss. She had an acute kidney injury along with chronic renal insufficiency and therefore some part of her anemia may be renal related. Also she has multiple risk factors for anemia of chronic disease.  At this time I recommend repeating the CBC along with iron studies. If her iron studies are more consistent with anemia of chronic disease, then I recommend a referral to hematology for further evaluation/workup/recommendations. I do not think proceeding with video capsule endoscopy is necessary at this time.  I will see her back in one month to evaluate her nausea and follow her blood counts.

## 2011-01-01 NOTE — Patient Instructions (Signed)
Your prescriptions have been given to you to take to your pharmacy. Please have your labs done today at our basement lab. Please return in 4 weeks for a follow up.

## 2011-01-05 ENCOUNTER — Telehealth: Payer: Self-pay | Admitting: *Deleted

## 2011-01-05 NOTE — Telephone Encounter (Signed)
Message copied by Florene Glen on Tue Jan 05, 2011 10:22 AM ------      Message from: Beverley Fiedler      Created: Mon Jan 04, 2011  1:16 PM       Iron panel reveals anemia with mixed picture.      As we discussed in clinic I do not think this is necessarily related to iron def and blood loss anemia.      Would rec Heme consult Re: anemia      F/u in 1 month with me.

## 2011-01-05 NOTE — Telephone Encounter (Signed)
Notified pt of Dr Malachy Chamber findings and recommendations. She will f/u with Dr Rhea Belton on 02/04/11 at 4pm. Dr Rhea Belton, did you want the Heme referral now or after the 02/04/11? Thanks.

## 2011-01-05 NOTE — Telephone Encounter (Signed)
[facility] lmom informing pt of the referral.

## 2011-01-05 NOTE — Telephone Encounter (Signed)
Heme referral now please.  Re: anemia (mixed type, chronic disease??)

## 2011-01-12 ENCOUNTER — Telehealth: Payer: Self-pay | Admitting: Oncology

## 2011-01-12 NOTE — Telephone Encounter (Signed)
lmonvm for pt to call me re appt w/FS - new pt.

## 2011-01-13 ENCOUNTER — Telehealth: Payer: Self-pay | Admitting: Oncology

## 2011-01-13 NOTE — Telephone Encounter (Signed)
S/w pt re appt for 11/16 @ 1:30 pm w/FS. Per pt request called [facility] also w/appt [phone]). lmonvm for chimere w/appt d/t/location.

## 2011-01-14 ENCOUNTER — Encounter: Payer: Self-pay | Admitting: *Deleted

## 2011-01-15 ENCOUNTER — Other Ambulatory Visit: Payer: Self-pay | Admitting: Oncology

## 2011-01-15 DIAGNOSIS — D649 Anemia, unspecified: Secondary | ICD-10-CM

## 2011-01-15 DIAGNOSIS — E538 Deficiency of other specified B group vitamins: Secondary | ICD-10-CM

## 2011-01-15 DIAGNOSIS — D7589 Other specified diseases of blood and blood-forming organs: Secondary | ICD-10-CM

## 2011-01-21 ENCOUNTER — Telehealth: Payer: Self-pay | Admitting: *Deleted

## 2011-01-21 NOTE — Telephone Encounter (Signed)
Spoke with patient, reminded her of new patient appt 01/22/11 with dr Clelia Croft.

## 2011-01-22 ENCOUNTER — Ambulatory Visit: Payer: Medicare Other

## 2011-01-22 ENCOUNTER — Encounter: Payer: Self-pay | Admitting: *Deleted

## 2011-01-22 ENCOUNTER — Other Ambulatory Visit (HOSPITAL_BASED_OUTPATIENT_CLINIC_OR_DEPARTMENT_OTHER): Payer: Medicare Other | Admitting: Lab

## 2011-01-22 ENCOUNTER — Ambulatory Visit (HOSPITAL_BASED_OUTPATIENT_CLINIC_OR_DEPARTMENT_OTHER): Payer: Medicare Other | Admitting: Oncology

## 2011-01-22 VITALS — BP 182/80 | HR 89 | Temp 98.6°F

## 2011-01-22 DIAGNOSIS — D539 Nutritional anemia, unspecified: Secondary | ICD-10-CM

## 2011-01-22 DIAGNOSIS — N289 Disorder of kidney and ureter, unspecified: Secondary | ICD-10-CM

## 2011-01-22 DIAGNOSIS — C9000 Multiple myeloma not having achieved remission: Secondary | ICD-10-CM

## 2011-01-22 DIAGNOSIS — D7589 Other specified diseases of blood and blood-forming organs: Secondary | ICD-10-CM

## 2011-01-22 DIAGNOSIS — D649 Anemia, unspecified: Secondary | ICD-10-CM

## 2011-01-22 DIAGNOSIS — E119 Type 2 diabetes mellitus without complications: Secondary | ICD-10-CM

## 2011-01-22 DIAGNOSIS — E538 Deficiency of other specified B group vitamins: Secondary | ICD-10-CM

## 2011-01-22 LAB — CBC WITH DIFFERENTIAL/PLATELET
BASO%: 0.5 % (ref 0.0–2.0)
Eosinophils Absolute: 0.1 10*3/uL (ref 0.0–0.5)
MCHC: 33.5 g/dL (ref 31.5–36.0)
MCV: 95.5 fL (ref 79.5–101.0)
MONO#: 0.4 10*3/uL (ref 0.1–0.9)
MONO%: 7 % (ref 0.0–14.0)
NEUT#: 5 10*3/uL (ref 1.5–6.5)
RBC: 2.92 10*6/uL — ABNORMAL LOW (ref 3.70–5.45)
RDW: 16.1 % — ABNORMAL HIGH (ref 11.2–14.5)
WBC: 6.4 10*3/uL (ref 3.9–10.3)

## 2011-01-22 LAB — CHCC SMEAR

## 2011-01-22 NOTE — Progress Notes (Signed)
Note Dictated. Work# [id number]

## 2011-01-22 NOTE — Progress Notes (Signed)
CC:   Erick Blinks, MD Tera Mater. Evlyn Kanner, M.D.  REASON FOR CONSULTATION:  Anemia.  HISTORY OF PRESENT ILLNESS:  This is a 77 year old woman, a native of [place], [place], currently resides here in [place].  She is a very pleasant woman with multiple comorbid conditions including diabetes, renal insufficiency, and she is status post a recent hospitalization back in August of 2012 where had presented with acute renal failure and found to have heme-positive stool as well as significant anemia.  She had a GI workup at that time which including upper endoscopy, which showed mild gastritis and H. pylori-negative but also a colonoscopy revealed some sigmoid diverticulosis at that time. At that time she was on warfarin therapy and that was discontinued.  She subsequently was discharged to the [facility], and she is ready to be discharged home and she wanted to establish followup with Hematology at this point, given the fact that despite her iron replacement and GI workup she still has some degree of anemia.  Clinically, she reports that she is regaining a lot of her activities of daily living slowly. Her renal failure has resolved.  She is not really dialysis-dependent. She did require 1 session of dialysis.  REVIEW OF SYSTEMS:  Did not report any headaches, blurry vision, double vision.  Did not report any motor or sensory neuropathy.  Did not report any alteration in mental status.  Did not report any psychiatric issues, depression.  Did not report any fever, chills, sweats.  Did not report any cough, hemoptysis, hematemesis.  No nausea or vomiting.  No abdominal pain, hematochezia or melena, genitourinary complaints.  Rest of review of systems is unremarkable.  PAST MEDICAL HISTORY:  Significant for diabetes, hyperlipidemia a history of atrial fibrillation, osteoarthritis, osteoporosis.  MEDICATIONS:  She is on aspirin, B complex, folic acid, some Bumex, calcium carbonate,  iron sulfate, insulin, losartan, meloxicam, multivitamin, Zofran, Protonix, VESIcare as well as sorbitol solution and Ambien.  ALLERGIES:  None.  FAMILY HISTORY:  Father died at 58.  Mother had kidney disease and died in her late 50s.  There is history on her father's side including her father's mother as well as her uncle that had cancer as well.  SOCIAL HISTORY:  She is married.  She has 1 son.  Denied any alcohol or tobacco abuse.  Currently retired.  PHYSICAL EXAMINATION:  Alert, awake female, appeared in no active distress.  Blood pressure is 182/80, pulse is 89, respiration 20, temperature is 98.6.  She weighs 238 pounds.  Head:  Normocephalic, atraumatic.  Pupils equal and round, reactive to light.  Oral mucosa moist and pink.  Neck:  Supple without adenopathy.  Heart:  Regular rate and rhythm, S1 and S2.  Lungs:  Clear to auscultation.  No rhonchi, wheeze or dullness to percussion.  Abdomen:  Soft, nontender.  No hepatosplenomegaly.  Extremities:  No clubbing, cyanosis.  Trace edema.  LABORATORY DATA:  Hemoglobin of 9.3, white cell count 6.4, platelet count of 261.  MCV today is 95.5, RDW is 16.1.  Peripheral smear was personally reviewed today, and I could not appreciate any red cell abnormalities.  I could not appreciate any red cell fragmentation, schistocytosis.  There is no hypochromia or microcytosis or a spherocytosis at this time.  ASSESSMENT AND PLAN:  A 77 year old female with normocytic, normochromic anemia.  Differential diagnosis today is discussed with Ms. Teng and includes but not limited to include an iron-deficiency anemia.  She could have also a metabolic derangement as well  as maybe vitamin B 12 or folic acid deficiency.  I think these are unlikely at this point.  There is really no history of that.  Certainly an anemia of renal disease as well as chronic anemia or anemia of chronic disease are distinct possibilities at this time given her chronic  health conditions as her chronic renal insufficiency.  Other conditions such as myelodysplastic syndrome are certainly a possibility.  I think it is less likely from the presentation.  Also a multiple myeloma would also be unlikely at this point.  To work this up, I will repeat iron studies on her today as well as a B12 and folic acid.  Also, we will obtain an erythropoietin level.  If her iron stores are fully depleted, then we will proceed with Procrit or Aranesp therapy to boost her red cell production.  Risks and benefits of this treatment were discussed in details.  Medication guide was given to the patient.  She is agreeable to proceed in the possibility that her iron stores are completely depleted.  However if her iron stores are not completely replete, then will have to either increase her oral iron dose or use IV iron to fully replace her iron stores at that time.  I will communicate findings to the blood testing once it is fully resulted in the next 1-2 weeks.  FOLLOWUP:  I will have her come back in 2 months' time and repeat her hemoglobin and certainly sooner if we need to give her IV iron or Aranesp injection.    ______________________________ Benjiman Core, M.D. FNS/MEDQ  D:  01/22/2011  T:  01/22/2011  Job:  [id number]

## 2011-01-22 NOTE — Progress Notes (Signed)
Patient signed permit for aranesp today after given a guide and explanation by dr Clelia Croft. Scanned to shart.

## 2011-01-25 LAB — FOLATE: Folate: >20 ng/mL

## 2011-01-25 LAB — COMPREHENSIVE METABOLIC PANEL
ALT: 18 U/L (ref 0–35)
Albumin: 3.3 g/dL — ABNORMAL LOW (ref 3.5–5.2)
Alkaline Phosphatase: 105 U/L (ref 39–117)
Glucose, Bld: 202 mg/dL — ABNORMAL HIGH (ref 70–99)
Potassium: 4.1 mEq/L (ref 3.5–5.3)
Sodium: 137 mEq/L (ref 135–145)
Total Protein: 6.2 g/dL (ref 6.0–8.3)

## 2011-01-25 LAB — IRON AND TIBC: UIBC: 199 ug/dL (ref 125–400)

## 2011-01-25 LAB — ERYTHROPOIETIN: Erythropoietin: 28.5 m[IU]/mL (ref 2.6–34.0)

## 2011-01-25 LAB — VITAMIN B12: Vitamin B-12: 868 pg/mL (ref 211–911)

## 2011-01-27 ENCOUNTER — Encounter (HOSPITAL_COMMUNITY): Payer: Self-pay

## 2011-01-27 ENCOUNTER — Emergency Department (HOSPITAL_COMMUNITY): Payer: Medicare Other

## 2011-01-27 ENCOUNTER — Emergency Department (HOSPITAL_COMMUNITY)
Admission: EM | Admit: 2011-01-27 | Discharge: 2011-01-27 | Disposition: A | Discharge status: Home / Self Care | Payer: Medicare Other | Attending: Emergency Medicine | Admitting: Emergency Medicine

## 2011-01-27 DIAGNOSIS — S20229A Contusion of unspecified back wall of thorax, initial encounter: Secondary | ICD-10-CM | POA: Insufficient documentation

## 2011-01-27 DIAGNOSIS — Y93E8 Activity, other personal hygiene: Secondary | ICD-10-CM | POA: Insufficient documentation

## 2011-01-27 DIAGNOSIS — E785 Hyperlipidemia, unspecified: Secondary | ICD-10-CM | POA: Insufficient documentation

## 2011-01-27 DIAGNOSIS — Z96659 Presence of unspecified artificial knee joint: Secondary | ICD-10-CM | POA: Insufficient documentation

## 2011-01-27 DIAGNOSIS — R51 Headache: Secondary | ICD-10-CM | POA: Insufficient documentation

## 2011-01-27 DIAGNOSIS — S51809A Unspecified open wound of unspecified forearm, initial encounter: Secondary | ICD-10-CM | POA: Insufficient documentation

## 2011-01-27 DIAGNOSIS — E119 Type 2 diabetes mellitus without complications: Secondary | ICD-10-CM | POA: Insufficient documentation

## 2011-01-27 DIAGNOSIS — I4891 Unspecified atrial fibrillation: Secondary | ICD-10-CM | POA: Insufficient documentation

## 2011-01-27 DIAGNOSIS — I10 Essential (primary) hypertension: Secondary | ICD-10-CM | POA: Insufficient documentation

## 2011-01-27 DIAGNOSIS — Y92009 Unspecified place in unspecified non-institutional (private) residence as the place of occurrence of the external cause: Secondary | ICD-10-CM | POA: Insufficient documentation

## 2011-01-27 DIAGNOSIS — M810 Age-related osteoporosis without current pathological fracture: Secondary | ICD-10-CM | POA: Insufficient documentation

## 2011-01-27 DIAGNOSIS — M129 Arthropathy, unspecified: Secondary | ICD-10-CM | POA: Insufficient documentation

## 2011-01-27 DIAGNOSIS — W19XXXA Unspecified fall, initial encounter: Secondary | ICD-10-CM

## 2011-01-27 DIAGNOSIS — Z79899 Other long term (current) drug therapy: Secondary | ICD-10-CM | POA: Insufficient documentation

## 2011-01-27 DIAGNOSIS — N1330 Unspecified hydronephrosis: Secondary | ICD-10-CM | POA: Insufficient documentation

## 2011-01-27 DIAGNOSIS — Z794 Long term (current) use of insulin: Secondary | ICD-10-CM | POA: Insufficient documentation

## 2011-01-27 DIAGNOSIS — W050XXA Fall from non-moving wheelchair, initial encounter: Secondary | ICD-10-CM | POA: Insufficient documentation

## 2011-01-27 DIAGNOSIS — Y998 Other external cause status: Secondary | ICD-10-CM | POA: Insufficient documentation

## 2011-01-27 DIAGNOSIS — Z7982 Long term (current) use of aspirin: Secondary | ICD-10-CM | POA: Insufficient documentation

## 2011-01-27 DIAGNOSIS — S300XXA Contusion of lower back and pelvis, initial encounter: Secondary | ICD-10-CM

## 2011-01-27 LAB — GLUCOSE, CAPILLARY: Glucose-Capillary: 107 mg/dL — ABNORMAL HIGH (ref 70–99)

## 2011-01-27 MED ORDER — HYDROCODONE-ACETAMINOPHEN 5-500 MG PO TABS: 1.0000 | ORAL_TABLET | Freq: Four times a day (QID) | ORAL | Status: DC | PRN

## 2011-01-27 MED ORDER — TETANUS-DIPHTHERIA TOXOIDS TD 5-2 LFU IM INJ
0.5000 mL | INJECTION | Freq: Once | INTRAMUSCULAR | Status: DC
  Filled 2011-01-27: qty 0.5

## 2011-01-27 MED ORDER — HYDROCODONE-ACETAMINOPHEN 5-325 MG PO TABS
1.0000 | ORAL_TABLET | Freq: Once | ORAL | Status: AC
  Administered 2011-01-27: 1 via ORAL
  Filled 2011-01-27: qty 1

## 2011-01-27 MED ORDER — HYDROCODONE-ACETAMINOPHEN 5-325 MG PO TABS: 1.0000 | ORAL_TABLET | Freq: Four times a day (QID) | ORAL | Status: DC | PRN

## 2011-01-27 MED ORDER — TETANUS-DIPHTH-ACELL PERTUSSIS 5-2.5-18.5 LF-MCG/0.5 IM SUSP
0.5000 mL | Freq: Once | INTRAMUSCULAR | Status: AC
  Administered 2011-01-27: 0.5 mL via INTRAMUSCULAR
  Filled 2011-01-27: qty 0.5

## 2011-01-27 NOTE — ED Notes (Signed)
ZOX:[id number]<WU> Expected date:01/27/11<BR> Expected time: 3:00 PM<BR> Means of arrival:Ambulance<BR> Comments:<BR> EMS 50 GC, 77 yof fall

## 2011-01-27 NOTE — ED Notes (Signed)
Patient transported to X-ray 

## 2011-01-27 NOTE — ED Provider Notes (Signed)
History     CSN: [id number] Arrival date & time: 01/27/2011  3:40 PM   First MD Initiated Contact with Patient 01/27/11 1734      Chief Complaint  Patient presents with  . Fall    (Consider location/radiation/quality/duration/timing/severity/associated sxs/prior treatment) HPI History provided by pt.   Pt was pivoting from toilet to her wheelchair, missed the chair and fell, landing on her buttocks.  Unsure of whether or not she hit her head but no LOC and denies headache, dizziness, blurred vision and N/V.  She is not anti-coagulated.  C/o laceration to left forearm and pain in lower back.  Denies neck pain. Pt is wheelchair bound but has been able to support her weight on her legs since the fall.  Last tetanus unknown.   Past Medical History  Diagnosis Date  . Hypercholesterolemia   . Osteoporosis   . DM (diabetes mellitus)   . HTN (hypertension)     unspecified  . Atrial fibrillation   . Macular degeneration   . Osteoarthritis   . Edema   . Anemia   . Hydronephrosis     left    Past Surgical History  Procedure Date  . Replacement total knee 08/2007    bilateral  . Appendectomy   . Cholecystectomy     Family History  Problem Relation Age of Onset  . Diabetes Father   . Atrial fibrillation Father   . Breast cancer Paternal Grandmother   . Heart disease Mother     History  Substance Use Topics  . Smoking status: Never Smoker   . Smokeless tobacco: Never Used  . Alcohol Use: No    OB History    Grav Para Term Preterm Abortions TAB SAB Ect Mult Living                  Review of Systems  All other systems reviewed and are negative.    Allergies  Review of patient's allergies indicates no known allergies.  Home Medications   Current Outpatient Rx  Name Route Sig Dispense Refill  . ACETAMINOPHEN 325 MG PO TABS Oral Take 650 mg by mouth every 6 (six) hours as needed. Pain.    . ASPIRIN 81 MG PO TABS Oral Take 81 mg by mouth daily.      Marland Kitchen  NEPHRO-VITE PO Oral Take 1 capsule by mouth daily.      Marland Kitchen CALCIUM CARBONATE-VITAMIN D 600-400 MG-UNIT PO TABS Oral Take 1 tablet by mouth 2 (two) times daily.      Marland Kitchen FERROUS SULFATE 325 (65 FE) MG PO TABS Oral Take 325 mg by mouth daily with breakfast.      . INSULIN ISOPHANE & REGULAR (70-30) 100 UNIT/ML [place] SUSP Subcutaneous Inject 5-13 Units into the skin 2 (two) times daily with a meal. Use 13 units in the morning and 5 units at bedtime    . METOPROLOL TARTRATE 25 MG PO TABS Oral Take 12.5 mg by mouth 2 (two) times daily. Take 1/2 tablet twice daily    . CERTAGEN PO Oral Take 1 tablet by mouth daily.      . MULTIVITAMINS PO TABS Oral Take 1 tablet by mouth daily.      . OCUVITE-LUTEIN PO CAPS Oral Take 1 capsule by mouth daily.      Marland Kitchen ONDANSETRON HCL 4 MG PO TABS Oral Take 4 mg by mouth every 6 (six) hours as needed.      Marland Kitchen POLYETHYLENE GLYCOL 3350 PO PACK Oral Take  17 g by mouth daily.      Marland Kitchen PROMETHAZINE HCL 25 MG PO TABS Oral Take 25 mg by mouth every 6 (six) hours as needed.      . SENNA-DOCUSATE SODIUM 8.6-50 MG PO TABS Oral Take 2 tablets by mouth at bedtime.      Marland Kitchen ZOLPIDEM TARTRATE 5 MG PO TABS Oral Take 5 mg by mouth at bedtime as needed.      . ATORVASTATIN CALCIUM 20 MG PO TABS Oral Take 20 mg by mouth daily.      Marland Kitchen LOSARTAN POTASSIUM 100 MG PO TABS Oral Take 100 mg by mouth daily.      . SORBITOL 70 % SOLN Oral Take by mouth daily as needed.        BP 181/60  Pulse 73  Temp(Src) 98.4 F (36.9 C) (Oral)  Resp 18  Ht 5\' 10"  (1.778 m)  Wt 236 lb (107.049 kg)  BMI 33.86 kg/m2  SpO2 98%  Physical Exam  Nursing note and vitals reviewed. Constitutional: She is oriented to person, place, and time. She appears well-developed and well-nourished. No distress.  HENT:  Head: Normocephalic and atraumatic.       Non-tender  Eyes:       Normal appearance  Neck: Normal range of motion.  Musculoskeletal:       Cervical/thoracic spine non-tender.  Mod lumbar spine as and mild  paraspinal ttp.  Pelvis stable.  Left hip ttp.   Neurological: She is alert and oriented to person, place, and time.       Nml sensation.  Nml strength.  Skin:       Superficial skin tear extensor surface left forearm.  Hemostatic. Mildly ttp.  Psychiatric: She has a normal mood and affect. Her behavior is normal.    ED Course  Procedures (including critical care time)  Labs Reviewed - No data to display Dg Lumbar Spine Complete  01/27/2011  *RADIOLOGY REPORT*  Clinical Data: Fall.  Back pain.  LUMBAR SPINE - COMPLETE 4+ VIEW  Comparison: CT abdomen and pelvis 02/13/2010.  Findings: No fracture or subluxation is identified.  Severe multilevel degenerative change is present.  There is convex left scoliosis.  IMPRESSION: No acute finding.  Original Report Authenticated By: Bernadene Bell. Maricela Curet, M.D.   Dg Sacrum/coccyx  01/27/2011  *RADIOLOGY REPORT*  Clinical Data: Larey Seat.  Sacrococcygeal injury.  SACRUM AND COCCYX - 2+ VIEW 01/27/2011:  Comparison: None.  Findings: No acute angulation involving the coccyx.  No visible sacral fractures.  Severe osteopenia.  Symphysis pubis intact with mild degenerative changes.  Sacroiliac joints intact with degenerative changes.  IMPRESSION: No acute osseous abnormality.  Original Report Authenticated By: Arnell Sieving, M.D.   Dg Hip Complete Left  01/27/2011  *RADIOLOGY REPORT*  Clinical Data: Larey Seat and injured left hip.  LEFT HIP - COMPLETE 2+ VIEW 01/27/2011:  Comparison: None.  Findings: No evidence of acute fracture or dislocation.  Moderate axial joint space narrowing.  Bone mineral density relatively well preserved.  Included AP pelvis demonstrates an intact contralateral right hip with mild joint space narrowing.  Sacroiliac joints and symphysis pubis intact with degenerative changes.  Severe degenerative changes involve the visualized lower lumbar spine.  Innumerable injection granulomata in the buttocks.  IMPRESSION: No acute osseous abnormality.   Moderate osteoarthritis involving the left hip.  Original Report Authenticated By: Arnell Sieving, M.D.   Ct Head Wo Contrast  01/27/2011  *RADIOLOGY REPORT*  Clinical Data:  Fall, neck pain  CT  HEAD WITHOUT CONTRAST CT CERVICAL SPINE WITHOUT CONTRAST  Technique:  Multidetector CT imaging of the head and cervical spine was performed following the standard protocol without intravenous contrast.  Multiplanar CT image reconstructions of the cervical spine were also generated.  Comparison:  [facility] CT head dated 10/10/2010  CT HEAD  Findings: No evidence of parenchymal hemorrhage or extra-axial fluid collection. No mass lesion, mass effect, or midline shift.  No CT evidence of acute infarction.  Subcortical white matter and periventricular small vessel ischemic changes.  Global cortical atrophy.  No ventriculomegaly.  The visualized paranasal sinuses are essentially clear. The mastoid air cells are unopacified.  No evidence of calvarial fracture.  IMPRESSION: No evidence of acute intracranial abnormality.  Atrophy with small vessel ischemic changes.  CT CERVICAL SPINE  Findings: Exaggerated cervical lordosis.  No evidence of fracture or dislocation.  Vertebral body heights are maintained.  Dens appears intact.  No prevertebral soft tissue swelling.  Moderate multilevel degenerative changes.  Visualized thyroid is unremarkable.  Visualized lung apices are clear.  IMPRESSION: No evidence of traumatic injury to the cervical spine.  Moderate multilevel degenerative changes.  Original Report Authenticated By: Charline Bills, M.D.   Ct Cervical Spine Wo Contrast  01/27/2011  *RADIOLOGY REPORT*  Clinical Data:  Fall, neck pain  CT HEAD WITHOUT CONTRAST CT CERVICAL SPINE WITHOUT CONTRAST  Technique:  Multidetector CT imaging of the head and cervical spine was performed following the standard protocol without intravenous contrast.  Multiplanar CT image reconstructions of the cervical spine were also generated.   Comparison:  [facility] CT head dated 10/10/2010  CT HEAD  Findings: No evidence of parenchymal hemorrhage or extra-axial fluid collection. No mass lesion, mass effect, or midline shift.  No CT evidence of acute infarction.  Subcortical white matter and periventricular small vessel ischemic changes.  Global cortical atrophy.  No ventriculomegaly.  The visualized paranasal sinuses are essentially clear. The mastoid air cells are unopacified.  No evidence of calvarial fracture.  IMPRESSION: No evidence of acute intracranial abnormality.  Atrophy with small vessel ischemic changes.  CT CERVICAL SPINE  Findings: Exaggerated cervical lordosis.  No evidence of fracture or dislocation.  Vertebral body heights are maintained.  Dens appears intact.  No prevertebral soft tissue swelling.  Moderate multilevel degenerative changes.  Visualized thyroid is unremarkable.  Visualized lung apices are clear.  IMPRESSION: No evidence of traumatic injury to the cervical spine.  Moderate multilevel degenerative changes.  Original Report Authenticated By: Charline Bills, M.D.     1. Fall   2. Contusion of lower back       MDM  Pt had a mechanical fall in bathroom this evening.  Unsure of whether or not she hit her head but denies headache or other neuro deficits and head w/out signs of trauma.  Has superficial skin tear left forearm and pain/tenderness lumbar spine.  L hip tenderness.  Tetanus updated; nursing staff cleaned/dressed wound.  Xray L-spine and sacrum pending.  One po vicodin ordered for pain.   Pt reports that she now has soreness on the left side of her head and can feel a bump.  Will CT head.  7:00 PM   CT head/c-spine and xrays L hip, L-spine and sacrum neg for acute pathology.  Results discussed w/ pt.  Reports relief w/ vicodin.  Will d/c home w/ same.  Recommended sitting on pillow and following up with PCP early next week if no relief of pain.  Head trauma return precautions discussed. 8:58  PM          Otilio Miu, PA 01/28/11 1005

## 2011-01-27 NOTE — ED Notes (Signed)
Per EMS: Pt from home. Slid in bathroom skin tear to Lt forearm.  Minor buttocks pain.  No LOC.  No neck/back pain.  CBG 151.

## 2011-01-28 NOTE — ED Provider Notes (Signed)
Medical screening examination/treatment/procedure(s) were performed by non-physician practitioner and as supervising physician I was immediately available for consultation/collaboration.    Nelia Shi, MD 01/28/11 [phone]

## 2011-02-02 ENCOUNTER — Encounter: Payer: Self-pay | Admitting: *Deleted

## 2011-02-03 ENCOUNTER — Encounter: Payer: Self-pay | Admitting: Internal Medicine

## 2011-02-04 ENCOUNTER — Ambulatory Visit (INDEPENDENT_AMBULATORY_CARE_PROVIDER_SITE_OTHER): Payer: Medicare Other | Admitting: Internal Medicine

## 2011-02-04 ENCOUNTER — Encounter: Payer: Self-pay | Admitting: Internal Medicine

## 2011-02-04 VITALS — BP 130/72 | HR 80 | Ht 70.0 in | Wt 238.0 lb

## 2011-02-04 DIAGNOSIS — R110 Nausea: Secondary | ICD-10-CM

## 2011-02-04 DIAGNOSIS — D649 Anemia, unspecified: Secondary | ICD-10-CM

## 2011-02-04 NOTE — Progress Notes (Signed)
Subjective:    Patient ID: Kathryn [name], female    DOB: 1934/01/14, 77 y.o.   MRN: [id number]  HPI 77 year old female with a PMH of atrial for ablation, hypertension, diabetes, hypercholesterolemia, osteoporosis who seen in followup for nausea and ongoing anemia.  Since her last visit with me she has been discharged from her skilled nursing facility and is living at home alone. Her son is frequently there to check on her. She is getting in-home health.  She did have a fall on the day before Thanksgiving but other than a skin tear had no significant injury. She also since last seeing me was seen by Mematology, Dr. Clelia Croft.  She reports no further evidence of bleeding, including no melena or bright red blood per rectum.  She reports her nausea has completely resolved, and again she contributes this to not having to eat the food at her nursing facility.  No vomiting. No fevers or chills. No early satiety. Good appetite. No abdominal pain  Review of Systems Constitutional: Negative for fever, chills, night sweats, activity change, appetite change and unexpected weight change HEENT: Negative for sore throat, mouth sores and trouble swallowing. Eyes: Negative for visual disturbance Respiratory: Negative for cough, chest tightness and shortness of breath Cardiovascular: Negative for chest pain, palpitations and lower extremity swelling Gastrointestinal: See history of present illness Genitourinary: Negative for dysuria and hematuria. Musculoskeletal: Positive for chronic back Skin: Negative for rash or color change, positive for skin tear after a fall Neurological: Negative for headaches, weakness, numbness Hematological: Negative for adenopathy, positive for easy bruising Psychiatric/behavioral: Negative for depressed mood, negative for anxiety  Patient Active Problem List  Diagnoses  . DM  . HYPERCHOLESTEROLEMIA  IIA  . MACULAR DEGENERATION  . HYPERTENSION, UNSPECIFIED  . ATRIAL FIBRILLATION    . OSTEOARTHRITIS  . OSTEOPOROSIS  . EDEMA  . Anemia   Current Outpatient Prescriptions  Medication Sig Dispense Refill  . acetaminophen (TYLENOL) 325 MG tablet Take 650 mg by mouth every 6 (six) hours as needed. Pain.      Marland Kitchen aspirin 81 MG tablet Take 81 mg by mouth daily.        Marland Kitchen atorvastatin (LIPITOR) 20 MG tablet Take 20 mg by mouth daily.        . B Complex-C-Folic Acid (NEPHRO-VITE PO) Take 1 capsule by mouth daily.        . Calcium Carbonate-Vitamin D 600-400 MG-UNIT per tablet Take 1 tablet by mouth 2 (two) times daily.        . ferrous sulfate 325 (65 FE) MG tablet Take 325 mg by mouth daily with breakfast.        . insulin NPH-insulin regular (HUMULIN 70/30) (70-30) 100 UNIT/ML injection Inject 5-13 Units into the skin 2 (two) times daily with a meal. Use 13 units in the morning and 5 units at bedtime      . losartan (COZAAR) 100 MG tablet Take 100 mg by mouth daily.        . metoprolol tartrate (LOPRESSOR) 25 MG tablet Take 12.5 mg by mouth 2 (two) times daily. Take 1/2 tablet twice daily      . Multiple Vitamins-Minerals (CERTAGEN PO) Take 1 tablet by mouth daily.        . multivitamin (THERAGRAN) per tablet Take 1 tablet by mouth daily.        . multivitamin-lutein (OCUVITE-LUTEIN) CAPS Take 1 capsule by mouth daily.        . ondansetron (ZOFRAN) 4 MG tablet Take 4  mg by mouth every 6 (six) hours as needed.        . polyethylene glycol (MIRALAX / GLYCOLAX) packet Take 17 g by mouth daily.        . promethazine (PHENERGAN) 25 MG tablet Take 25 mg by mouth every 6 (six) hours as needed.        . sennosides-docusate sodium (SENOKOT-S) 8.6-50 MG tablet Take 2 tablets by mouth at bedtime.        . sorbitol 70 % SOLN Take by mouth daily as needed.        . zolpidem (AMBIEN) 5 MG tablet Take 5 mg by mouth at bedtime as needed.         No Known Allergies  Family History  Problem Relation Age of Onset  . Diabetes Father   . Atrial fibrillation Father   . Breast cancer Paternal  Grandmother   . Heart disease Mother    SH - reviewed, and other than what is noted in the history of present illness, there is no change    Objective:   Physical Exam BP 130/72  Pulse 80  Ht 5\' 10"  (1.778 m)  Wt 238 lb (107.956 kg)  BMI 34.15 kg/m2 Constitutional: Well-developed and well-nourished. No distress. HEENT: Normocephalic and atraumatic. Oropharynx is clear and moist. No oropharyngeal exudate. Conjunctivae are normal. Pupils are equal round and reactive to light. No scleral icterus. Neck: Neck supple. Trachea midline. Cardiovascular: Normal rate, regular rhythm and intact distal pulses. Soft 2/6 systolic ejection murmur Pulmonary/chest: Effort normal and breath sounds normal. No wheezing, rales or rhonchi. Abdominal: Soft, obese, nontender, nondistended. Bowel sounds active throughout.  Extremities: no clubbing, cyanosis, or trace lower extremity edema, left upper extremity wrapped with gauze at the forearm Neurological: Alert and oriented to person place and time. Skin: Skin is warm and dry. No rashes noted. Psychiatric: Normal mood and affect. Behavior is normal.  CBC    Component Value Date/Time   WBC 6.4 01/22/2011 1331   WBC 6.1 01/01/2011 1518   RBC 2.92* 01/22/2011 1331   RBC 2.91* 01/01/2011 1518   HGB 9.3* 01/22/2011 1331   HGB 9.5* 01/01/2011 1518   HCT 27.9* 01/22/2011 1331   HCT 28.0* 01/01/2011 1518   PLT 261 01/22/2011 1331   PLT 258.0 01/01/2011 1518   MCV 95.5 01/22/2011 1331   MCV 96.1 01/01/2011 1518   MCH 32.0 01/22/2011 1331   MCH 31.8 11/15/2010 1345   MCHC 33.5 01/22/2011 1331   MCHC 34.0 01/01/2011 1518   RDW 16.1* 01/22/2011 1331   RDW 15.6* 01/01/2011 1518   LYMPHSABS 0.8* 01/22/2011 1331   LYMPHSABS 0.7 01/01/2011 1518   MONOABS 0.4 01/22/2011 1331   MONOABS 0.6 01/01/2011 1518   EOSABS 0.1 01/22/2011 1331   EOSABS 0.1 01/01/2011 1518   BASOSABS 0.0 01/22/2011 1331   BASOSABS 0.0 01/01/2011 1518       Assessment & Plan:    77 year old female with a PMH of atrial for ablation, hypertension, diabetes, hypercholesterolemia, osteoporosis who seen in followup for her anemia.  1. Anemia -- as discussed before, her anemia is likely multifactorial. I appreciate the assistance of Dr. Clelia Croft in the evaluation of her anemia. He plans to begin Aranesp injections next week. I do not feel that a cold blood loss is currently an issue, and therefore I do not think further procedure is necessary at this time. She did have EGD and colonoscopy as documented in my previous note, earlier in the year ( August 2012).  She will continue to follow with Dr. Clelia Croft for the treatment of her anemia, and therefore I will not schedule followup with me. Should a concern for GI bleeding, or occult bleeding returned and she can see me at that point. I've also advised that she can see me on an as-needed basis by simply calling our office.  2. Nausea -- this has completely resolved with no other alarm symptoms. Therefore nothing further necessary at present.

## 2011-02-04 NOTE — Patient Instructions (Signed)
Follow up as needed

## 2011-02-05 ENCOUNTER — Other Ambulatory Visit: Payer: Self-pay | Admitting: Oncology

## 2011-02-05 ENCOUNTER — Other Ambulatory Visit: Payer: Medicare Other | Admitting: Lab

## 2011-02-05 ENCOUNTER — Ambulatory Visit (HOSPITAL_BASED_OUTPATIENT_CLINIC_OR_DEPARTMENT_OTHER): Payer: Medicare Other

## 2011-02-05 VITALS — BP 180/87 | HR 77 | Temp 99.1°F

## 2011-02-05 DIAGNOSIS — I4891 Unspecified atrial fibrillation: Secondary | ICD-10-CM

## 2011-02-05 DIAGNOSIS — D649 Anemia, unspecified: Secondary | ICD-10-CM

## 2011-02-05 LAB — CBC WITH DIFFERENTIAL/PLATELET
BASO%: 0.8 % (ref 0.0–2.0)
EOS%: 1.5 % (ref 0.0–7.0)
MCH: 31.5 pg (ref 25.1–34.0)
MCHC: 33.2 g/dL (ref 31.5–36.0)
MONO#: 0.6 10*3/uL (ref 0.1–0.9)
NEUT%: 75.5 % (ref 38.4–76.8)
RBC: 3.08 10*6/uL — ABNORMAL LOW (ref 3.70–5.45)
RDW: 15 % — ABNORMAL HIGH (ref 11.2–14.5)
WBC: 5.9 10*3/uL (ref 3.9–10.3)
lymph#: 0.7 10*3/uL — ABNORMAL LOW (ref 0.9–3.3)

## 2011-02-05 LAB — PROTIME-INR
INR: 1.1 — ABNORMAL LOW (ref 2.00–3.50)
Protime: 13.2 Seconds (ref 10.6–13.4)

## 2011-02-05 MED ORDER — DARBEPOETIN ALFA-POLYSORBATE 500 MCG/ML IJ SOLN
300.0000 ug | Freq: Once | INTRAMUSCULAR | Status: AC
  Administered 2011-02-05: 300 ug via SUBCUTANEOUS
  Filled 2011-02-05: qty 1

## 2011-02-17 ENCOUNTER — Emergency Department (HOSPITAL_COMMUNITY): Payer: Medicare Other

## 2011-02-17 ENCOUNTER — Encounter (HOSPITAL_COMMUNITY): Payer: Self-pay | Admitting: *Deleted

## 2011-02-17 ENCOUNTER — Inpatient Hospital Stay (HOSPITAL_COMMUNITY)
Admission: EM | Admit: 2011-02-17 | Discharge: 2011-02-24 | DRG: 690 | Disposition: A | Discharge status: Skilled Nursing Facility | Payer: Medicare Other | Attending: Endocrinology | Admitting: Endocrinology

## 2011-02-17 DIAGNOSIS — N10 Acute tubulo-interstitial nephritis: Secondary | ICD-10-CM

## 2011-02-17 DIAGNOSIS — L8992 Pressure ulcer of unspecified site, stage 2: Secondary | ICD-10-CM | POA: Diagnosis present

## 2011-02-17 DIAGNOSIS — E1129 Type 2 diabetes mellitus with other diabetic kidney complication: Secondary | ICD-10-CM | POA: Diagnosis present

## 2011-02-17 DIAGNOSIS — R5381 Other malaise: Secondary | ICD-10-CM | POA: Diagnosis present

## 2011-02-17 DIAGNOSIS — I4891 Unspecified atrial fibrillation: Secondary | ICD-10-CM | POA: Diagnosis present

## 2011-02-17 DIAGNOSIS — N058 Unspecified nephritic syndrome with other morphologic changes: Secondary | ICD-10-CM | POA: Diagnosis present

## 2011-02-17 DIAGNOSIS — E785 Hyperlipidemia, unspecified: Secondary | ICD-10-CM | POA: Diagnosis present

## 2011-02-17 DIAGNOSIS — R319 Hematuria, unspecified: Secondary | ICD-10-CM | POA: Diagnosis present

## 2011-02-17 DIAGNOSIS — B961 Klebsiella pneumoniae [K. pneumoniae] as the cause of diseases classified elsewhere: Secondary | ICD-10-CM | POA: Diagnosis present

## 2011-02-17 DIAGNOSIS — Z853 Personal history of malignant neoplasm of breast: Secondary | ICD-10-CM

## 2011-02-17 DIAGNOSIS — Z22322 Carrier or suspected carrier of Methicillin resistant Staphylococcus aureus: Secondary | ICD-10-CM

## 2011-02-17 DIAGNOSIS — E876 Hypokalemia: Secondary | ICD-10-CM | POA: Diagnosis present

## 2011-02-17 DIAGNOSIS — N289 Disorder of kidney and ureter, unspecified: Secondary | ICD-10-CM

## 2011-02-17 DIAGNOSIS — R509 Fever, unspecified: Secondary | ICD-10-CM

## 2011-02-17 DIAGNOSIS — N390 Urinary tract infection, site not specified: Principal | ICD-10-CM | POA: Diagnosis present

## 2011-02-17 DIAGNOSIS — E860 Dehydration: Secondary | ICD-10-CM

## 2011-02-17 DIAGNOSIS — H3530 Unspecified macular degeneration: Secondary | ICD-10-CM | POA: Diagnosis present

## 2011-02-17 DIAGNOSIS — R32 Unspecified urinary incontinence: Secondary | ICD-10-CM | POA: Diagnosis present

## 2011-02-17 DIAGNOSIS — R609 Edema, unspecified: Secondary | ICD-10-CM | POA: Diagnosis present

## 2011-02-17 DIAGNOSIS — M810 Age-related osteoporosis without current pathological fracture: Secondary | ICD-10-CM | POA: Diagnosis present

## 2011-02-17 DIAGNOSIS — I129 Hypertensive chronic kidney disease with stage 1 through stage 4 chronic kidney disease, or unspecified chronic kidney disease: Secondary | ICD-10-CM | POA: Diagnosis present

## 2011-02-17 DIAGNOSIS — N183 Chronic kidney disease, stage 3 unspecified: Secondary | ICD-10-CM | POA: Diagnosis present

## 2011-02-17 DIAGNOSIS — L89109 Pressure ulcer of unspecified part of back, unspecified stage: Secondary | ICD-10-CM | POA: Diagnosis present

## 2011-02-17 DIAGNOSIS — E46 Unspecified protein-calorie malnutrition: Secondary | ICD-10-CM | POA: Diagnosis present

## 2011-02-17 DIAGNOSIS — E871 Hypo-osmolality and hyponatremia: Secondary | ICD-10-CM | POA: Diagnosis present

## 2011-02-17 HISTORY — DX: Urinary tract infection, site not specified: N39.0

## 2011-02-17 LAB — DIFFERENTIAL
Basophils Relative: 0 % (ref 0–1)
Lymphocytes Relative: 8 % — ABNORMAL LOW (ref 12–46)
Monocytes Relative: 12 % (ref 3–12)
Neutro Abs: 6.6 10*3/uL (ref 1.7–7.7)

## 2011-02-17 LAB — URINE MICROSCOPIC-ADD ON

## 2011-02-17 LAB — URINALYSIS, ROUTINE W REFLEX MICROSCOPIC
Bilirubin Urine: NEGATIVE
Glucose, UA: NEGATIVE mg/dL
Nitrite: NEGATIVE
Specific Gravity, Urine: 1.012 (ref 1.005–1.030)
pH: 6 (ref 5.0–8.0)

## 2011-02-17 LAB — CBC
HCT: 29.1 % — ABNORMAL LOW (ref 36.0–46.0)
Hemoglobin: 10 g/dL — ABNORMAL LOW (ref 12.0–15.0)
MCHC: 34.4 g/dL (ref 30.0–36.0)
WBC: 8.3 10*3/uL (ref 4.0–10.5)

## 2011-02-17 LAB — LACTIC ACID, PLASMA: Lactic Acid, Venous: 1.3 mmol/L (ref 0.5–2.2)

## 2011-02-17 LAB — GLUCOSE, CAPILLARY

## 2011-02-17 LAB — BASIC METABOLIC PANEL
BUN: 35 mg/dL — ABNORMAL HIGH (ref 6–23)
CO2: 21 mEq/L (ref 19–32)
Chloride: 94 mEq/L — ABNORMAL LOW (ref 96–112)
GFR calc Af Amer: 28 mL/min — ABNORMAL LOW (ref >90)
Potassium: 4 mEq/L (ref 3.5–5.1)

## 2011-02-17 MED ORDER — SODIUM CHLORIDE 0.9 % IV SOLN
Freq: Once | INTRAVENOUS | Status: AC
  Administered 2011-02-17: 22:00:00 via INTRAVENOUS

## 2011-02-17 MED ORDER — ACETAMINOPHEN 325 MG PO TABS
ORAL_TABLET | ORAL | Status: AC
  Administered 2011-02-17: 650 mg via ORAL
  Filled 2011-02-17: qty 2

## 2011-02-17 MED ORDER — INSULIN ASPART 100 UNIT/ML ~~LOC~~ SOLN
0.0000 [IU] | Freq: Three times a day (TID) | SUBCUTANEOUS | Status: DC
  Administered 2011-02-18: 11 [IU] via SUBCUTANEOUS
  Administered 2011-02-18: 5 [IU] via SUBCUTANEOUS
  Administered 2011-02-19 (×3): 3 [IU] via SUBCUTANEOUS
  Administered 2011-02-20: 8 [IU] via SUBCUTANEOUS
  Administered 2011-02-20 – 2011-02-21 (×4): 3 [IU] via SUBCUTANEOUS
  Administered 2011-02-21: 8 [IU] via SUBCUTANEOUS
  Administered 2011-02-22: 2 [IU] via SUBCUTANEOUS
  Administered 2011-02-22 – 2011-02-23 (×4): 3 [IU] via SUBCUTANEOUS
  Administered 2011-02-23: 2 [IU] via SUBCUTANEOUS
  Filled 2011-02-17 (×2): qty 1
  Filled 2011-02-17: qty 3

## 2011-02-17 MED ORDER — DEXTROSE 5 % IV SOLN
1.0000 g | Freq: Once | INTRAVENOUS | Status: AC
  Administered 2011-02-17: 1 g via INTRAVENOUS
  Filled 2011-02-17: qty 10

## 2011-02-17 MED ORDER — ACETAMINOPHEN 325 MG PO TABS
650.0000 mg | ORAL_TABLET | Freq: Once | ORAL | Status: AC
  Administered 2011-02-17: 650 mg via ORAL

## 2011-02-17 NOTE — H&P (Signed)
PCP:   Julian Hy, MD   Chief Complaint: Weakness, hematuria, fever  HPI: Patient recently dcd from [place] where husband resides.  Home since tday with homehealth and occaisional helper.  Largely wheelchair bound. Today, found by home nure with hematuria, fever and weakness-brought to ER.  Found to have dehydration and uti--admitted as has no home support to manage.  Review of Systems:  The patient denies anorexia, weight loss,, vision loss, decreased hearing, hoarseness, chest pain, syncope, dyspnea on exertion, peripheral edema, balance deficits, hemoptysis, abdominal pain, melena, hematochezia, severe indigestion/heartburn, hematuria, incontinence, genital sores, muscle weakness, suspicious skin lesions, transient blindness, difficulty walking, depression, unusual weight change, abnormal bleeding, enlarged lymph nodes, angioedema, and breast masses.  Past Medical History: Past Medical History  Diagnosis Date  . Osteoporosis   . DM (diabetes mellitus)   . HTN (hypertension)     unspecified  . Atrial fibrillation   . Macular degeneration   . Osteoarthritis   . Edema   . Anemia   . Hydronephrosis     left  . UTI (lower urinary tract infection)    Past Surgical History  Procedure Date  . Replacement total knee 08/2007    bilateral  . Appendectomy   . Cholecystectomy   . Colonoscopy   . Upper gastrointestinal endoscopy     Medications: Prior to Admission medications   Medication Sig Start Date End Date Taking? Authorizing Provider  aspirin 81 MG tablet Take 81 mg by mouth daily.     Yes Historical Provider, MD  atorvastatin (LIPITOR) 20 MG tablet Take 20 mg by mouth daily.     Yes Historical Provider, MD  B Complex-C-Folic Acid (NEPHRO-VITE PO) Take 1 capsule by mouth daily.     Yes Historical Provider, MD  Calcium Carbonate-Vitamin D 600-400 MG-UNIT per tablet Take 1 tablet by mouth 2 (two) times daily.     Yes Historical Provider, MD  ferrous sulfate 325 (65 FE) MG  tablet Take 325 mg by mouth daily with breakfast.     Yes Historical Provider, MD  insulin NPH-insulin regular (HUMULIN 70/30) (70-30) 100 UNIT/ML injection Inject 5-13 Units into the skin 2 (two) times daily with a meal. Use 13 units in the morning and 5 units at bedtime   Yes Historical Provider, MD  losartan (COZAAR) 100 MG tablet Take 100 mg by mouth daily.     Yes Historical Provider, MD  metoprolol tartrate (LOPRESSOR) 25 MG tablet Take 25 mg by mouth 2 (two) times daily.    Yes Historical Provider, MD  Multiple Vitamins-Minerals (MULTIVITAMINS THER. W/MINERALS) TABS Take 1 tablet by mouth daily.     Yes Historical Provider, MD  multivitamin-lutein (OCUVITE-LUTEIN) CAPS Take 1 capsule by mouth daily.     Yes Historical Provider, MD  pantoprazole (PROTONIX) 40 MG tablet Take 40 mg by mouth daily.     Yes Historical Provider, MD  polyethylene glycol (MIRALAX / GLYCOLAX) packet Take 17 g by mouth daily.     Yes Historical Provider, MD  Rivaroxaban (XARELTO) 20 MG TABS Take 1 tablet by mouth daily.     Yes Historical Provider, MD  sennosides-docusate sodium (SENOKOT-S) 8.6-50 MG tablet Take 2 tablets by mouth at bedtime.     Yes Historical Provider, MD  solifenacin (VESICARE) 5 MG tablet Take 10 mg by mouth daily.     Yes Historical Provider, MD  zolpidem (AMBIEN) 5 MG tablet Take 5 mg by mouth at bedtime as needed. For sleep.   Yes Historical Provider, MD  Allergies:  No Known Allergies  Social History:  reports that she has never smoked. She has never used smokeless tobacco. She reports that she does not drink alcohol or use illicit drugs.  Family History: Family History  Problem Relation Age of Onset  . Diabetes Father   . Atrial fibrillation Father   . Breast cancer Paternal Grandmother   . Heart disease Mother     Physical Exam: Filed Vitals:   02/17/11 1504 02/17/11 1923 02/17/11 2053  BP: 135/99 163/61   Pulse: 95 105   Temp: 98.7 F (37.1 C) 102.6 F (39.2 C) 102.3 F  (39.1 C)  TempSrc: Oral Oral Oral  Resp: 20 20   SpO2: 100% 98%    General appearance: alert, cooperative, appears stated age and no distress Head: Normocephalic, without obvious abnormality, atraumatic Eyes: conjunctivae/corneas clear. PERRL, EOM's intact.  Nose: Nares normal. Septum midline. Mucosa normal. No drainage or sinus tenderness. Throat: lips, mucosa, and tongue normal; teeth and gums normal Neck: no adenopathy, no carotid bruit, no JVD and thyroid not enlarged, symmetric, no tenderness/mass/nodules Resp: clear to auscultation bilaterally Cardio: regular rate and rhythm, S1, S2 normal, no murmur, click, rub or gallop GI: soft, non-tender; bowel sounds normal; no masses,  no organomegaly, morbidly obese Extremities: extremities normal, atraumatic, no cyanosis or edema Pulses: 2+ and symmetric Lymph nodes: Cervical adenopathy: no cervical lymphadenopathy Neurologic: Alert and oriented X 3, normal strength and tone. Normal symmetric reflexes. Diffuse weakness    Labs on Admission:   [facility] 02/17/11 1602  NA 127*  K 4.0  CL 94*  CO2 21  GLUCOSE 207*  BUN 35*  CREATININE 1.95*  CALCIUM 9.2  MG --  PHOS --   No results found for this basename: AST:2,ALT:2,ALKPHOS:2,BILITOT:2,PROT:2,ALBUMIN:2 in the last 72 hours No results found for this basename: LIPASE:2,AMYLASE:2 in the last 72 hours  Basename 02/17/11 1602  WBC 8.3  NEUTROABS 6.6  HGB 10.0*  HCT 29.1*  MCV 90.7  PLT 212   No results found for this basename: CKTOTAL:3,CKMB:3,CKMBINDEX:3,TROPONINI:3 in the last 72 hours No results found for this basename: TSH,T4TOTAL,FREET3,T3FREE,THYROIDAB in the last 72 hours No results found for this basename: VITAMINB12:2,FOLATE:2,FERRITIN:2,TIBC:2,IRON:2,RETICCTPCT:2 in the last 72 hours  Radiological Exams on Admission: Dg Chest 2 View  02/17/2011  *RADIOLOGY REPORT*  Clinical Data: Fever.  Rule out infiltrate  CHEST - 2 VIEW  Comparison: 10/10/2010  Findings:  Cardiac enlargement without heart failure or edema.  No pleural effusion.  Negative for pneumonia.  IMPRESSION: No acute abnormality.  Original Report Authenticated By: Camelia Phenes, M.D.   Dg Lumbar Spine Complete  01/27/2011  *RADIOLOGY REPORT*  Clinical Data: Fall.  Back pain.  LUMBAR SPINE - COMPLETE 4+ VIEW  Comparison: CT abdomen and pelvis 02/13/2010.  Findings: No fracture or subluxation is identified.  Severe multilevel degenerative change is present.  There is convex left scoliosis.  IMPRESSION: No acute finding.  Original Report Authenticated By: Bernadene Bell. Maricela Curet, M.D.   Dg Sacrum/coccyx  01/27/2011  *RADIOLOGY REPORT*  Clinical Data: Larey Seat.  Sacrococcygeal injury.  SACRUM AND COCCYX - 2+ VIEW 01/27/2011:  Comparison: None.  Findings: No acute angulation involving the coccyx.  No visible sacral fractures.  Severe osteopenia.  Symphysis pubis intact with mild degenerative changes.  Sacroiliac joints intact with degenerative changes.  IMPRESSION: No acute osseous abnormality.  Original Report Authenticated By: Arnell Sieving, M.D.   Dg Hip Complete Left  01/27/2011  *RADIOLOGY REPORT*  Clinical Data: Larey Seat and injured left hip.  LEFT HIP - COMPLETE 2+ VIEW 01/27/2011:  Comparison: None.  Findings: No evidence of acute fracture or dislocation.  Moderate axial joint space narrowing.  Bone mineral density relatively well preserved.  Included AP pelvis demonstrates an intact contralateral right hip with mild joint space narrowing.  Sacroiliac joints and symphysis pubis intact with degenerative changes.  Severe degenerative changes involve the visualized lower lumbar spine.  Innumerable injection granulomata in the buttocks.  IMPRESSION: No acute osseous abnormality.  Moderate osteoarthritis involving the left hip.  Original Report Authenticated By: Arnell Sieving, M.D.   Ct Head Wo Contrast  01/27/2011  *RADIOLOGY REPORT*  Clinical Data:  Fall, neck pain  CT HEAD WITHOUT CONTRAST CT  CERVICAL SPINE WITHOUT CONTRAST  Technique:  Multidetector CT imaging of the head and cervical spine was performed following the standard protocol without intravenous contrast.  Multiplanar CT image reconstructions of the cervical spine were also generated.  Comparison:  [facility] CT head dated 10/10/2010  CT HEAD  Findings: No evidence of parenchymal hemorrhage or extra-axial fluid collection. No mass lesion, mass effect, or midline shift.  No CT evidence of acute infarction.  Subcortical white matter and periventricular small vessel ischemic changes.  Global cortical atrophy.  No ventriculomegaly.  The visualized paranasal sinuses are essentially clear. The mastoid air cells are unopacified.  No evidence of calvarial fracture.  IMPRESSION: No evidence of acute intracranial abnormality.  Atrophy with small vessel ischemic changes.  CT CERVICAL SPINE  Findings: Exaggerated cervical lordosis.  No evidence of fracture or dislocation.  Vertebral body heights are maintained.  Dens appears intact.  No prevertebral soft tissue swelling.  Moderate multilevel degenerative changes.  Visualized thyroid is unremarkable.  Visualized lung apices are clear.  IMPRESSION: No evidence of traumatic injury to the cervical spine.  Moderate multilevel degenerative changes.  Original Report Authenticated By: Charline Bills, M.D.   Ct Cervical Spine Wo Contrast  01/27/2011  *RADIOLOGY REPORT*  Clinical Data:  Fall, neck pain  CT HEAD WITHOUT CONTRAST CT CERVICAL SPINE WITHOUT CONTRAST  Technique:  Multidetector CT imaging of the head and cervical spine was performed following the standard protocol without intravenous contrast.  Multiplanar CT image reconstructions of the cervical spine were also generated.  Comparison:  [facility] CT head dated 10/10/2010  CT HEAD  Findings: No evidence of parenchymal hemorrhage or extra-axial fluid collection. No mass lesion, mass effect, or midline shift.  No CT evidence of acute infarction.   Subcortical white matter and periventricular small vessel ischemic changes.  Global cortical atrophy.  No ventriculomegaly.  The visualized paranasal sinuses are essentially clear. The mastoid air cells are unopacified.  No evidence of calvarial fracture.  IMPRESSION: No evidence of acute intracranial abnormality.  Atrophy with small vessel ischemic changes.  CT CERVICAL SPINE  Findings: Exaggerated cervical lordosis.  No evidence of fracture or dislocation.  Vertebral body heights are maintained.  Dens appears intact.  No prevertebral soft tissue swelling.  Moderate multilevel degenerative changes.  Visualized thyroid is unremarkable.  Visualized lung apices are clear.  IMPRESSION: No evidence of traumatic injury to the cervical spine.  Moderate multilevel degenerative changes.  Original Report Authenticated By: Charline Bills, M.D.   Orders placed during the hospital encounter of 10/10/10  . EKG  . EKG  . EKG    Assessment/Plan Principal Problem:  *UTI (urinary tract infection) Dehydration Acute renal failure Essential hypertension DM2 Failure to Thrive  Admit for ivf, antibiotics  Nyheem Binette A 02/17/2011, 11:24 PM

## 2011-02-17 NOTE — ED Notes (Signed)
EMS transport from home- per ems report pt c/o dysuria and frequency for "couple days"; noticed blood in her brief today- pt uses wheelchair at home

## 2011-02-17 NOTE — ED Provider Notes (Signed)
History     CSN: [id number] Arrival date & time: 02/17/2011  3:04 PM   Chief Complaint  Patient presents with  . Hematuria    HPI Pt was seen at 1540.  Per pt, c/o gradual onset and persistence of constant dysuria and frequency since last night.  States she has chronic urinary incontinence, today noticed blood in her brief today.  Has been assoc with generalized weakness and fatigue.  Denies abd pain, no flank pain, no fevers, no rash, no N/V/D, no CP/SOB.        Past Medical History  Diagnosis Date  . Osteoporosis   . DM (diabetes mellitus)   . HTN (hypertension)     unspecified  . Atrial fibrillation   . Macular degeneration   . Osteoarthritis   . Edema   . Anemia   . Hydronephrosis     left  . UTI (lower urinary tract infection)     Past Surgical History  Procedure Date  . Replacement total knee 08/2007    bilateral  . Appendectomy   . Cholecystectomy   . Colonoscopy   . Upper gastrointestinal endoscopy     Family History  Problem Relation Age of Onset  . Diabetes Father   . Atrial fibrillation Father   . Breast cancer Paternal Grandmother   . Heart disease Mother     History  Substance Use Topics  . Smoking status: Never Smoker   . Smokeless tobacco: Never Used  . Alcohol Use: No   Review of Systems ROS: Statement: All systems negative except as marked or noted in the HPI; Constitutional: Negative for fever and chills. +generalized weakness/fatigue ; ; Eyes: Negative for eye pain, redness and discharge. ; ; ENMT: Negative for ear pain, hoarseness, nasal congestion, sinus pressure and sore throat. ; ; Cardiovascular: Negative for chest pain, palpitations, diaphoresis, dyspnea and peripheral edema. ; ; Respiratory: Negative for cough, wheezing and stridor. ; ; Gastrointestinal: Negative for nausea, vomiting, diarrhea, abdominal pain, blood in stool, hematemesis, jaundice and rectal bleeding. . ; ; Genitourinary: Negative for flank pain and +dysuria and  hematuria. ; ; Musculoskeletal: Negative for back pain and neck pain. Negative for swelling and trauma.; ; Skin: Negative for pruritus, rash, abrasions, blisters, bruising and skin lesion.; ; Neuro: Negative for headache, lightheadedness and neck stiffness. Negative for altered level of consciousness , altered mental status, extremity weakness, paresthesias, involuntary movement, seizure and syncope.     Allergies  Review of patient's allergies indicates no known allergies.  Home Medications   Current Outpatient Rx  Name Route Sig Dispense Refill  . ASPIRIN 81 MG PO TABS Oral Take 81 mg by mouth daily.      . ATORVASTATIN CALCIUM 20 MG PO TABS Oral Take 20 mg by mouth daily.      Marland Kitchen NEPHRO-VITE PO Oral Take 1 capsule by mouth daily.      Marland Kitchen CALCIUM CARBONATE-VITAMIN D 600-400 MG-UNIT PO TABS Oral Take 1 tablet by mouth 2 (two) times daily.      Marland Kitchen FERROUS SULFATE 325 (65 FE) MG PO TABS Oral Take 325 mg by mouth daily with breakfast.      . INSULIN ISOPHANE & REGULAR (70-30) 100 UNIT/ML  SUSP Subcutaneous Inject 5-13 Units into the skin 2 (two) times daily with a meal. Use 13 units in the morning and 5 units at bedtime    . LOSARTAN POTASSIUM 100 MG PO TABS Oral Take 100 mg by mouth daily.      Marland Kitchen  METOPROLOL TARTRATE 25 MG PO TABS Oral Take 25 mg by mouth 2 (two) times daily.     Carma Leaven M PLUS PO TABS Oral Take 1 tablet by mouth daily.      . OCUVITE-LUTEIN PO CAPS Oral Take 1 capsule by mouth daily.      Marland Kitchen PANTOPRAZOLE SODIUM 40 MG PO TBEC Oral Take 40 mg by mouth daily.      Marland Kitchen POLYETHYLENE GLYCOL 3350 PO PACK Oral Take 17 g by mouth daily.      Marland Kitchen RIVAROXABAN 20 MG PO TABS Oral Take 1 tablet by mouth daily.      . SENNA-DOCUSATE SODIUM 8.6-50 MG PO TABS Oral Take 2 tablets by mouth at bedtime.      . SOLIFENACIN SUCCINATE 5 MG PO TABS Oral Take 10 mg by mouth daily.      Marland Kitchen ZOLPIDEM TARTRATE 5 MG PO TABS Oral Take 5 mg by mouth at bedtime as needed. For sleep.      BP 135/99  Pulse 95   Temp(Src) 98.7 F (37.1 C) (Oral)  Resp 20  SpO2 100%  Physical Exam 1545: Physical examination:  Nursing notes reviewed; Vital signs and O2 SAT reviewed;  Constitutional: Well developed, Well nourished, Well hydrated, In no acute distress; Head:  Normocephalic, atraumatic; Eyes: EOMI, PERRL, No scleral icterus; ENMT: Mouth and pharynx normal, Mucous membranes moist; Neck: Supple, Full range of motion, No lymphadenopathy; Cardiovascular: Regular rate and rhythm, No murmur, rub, or gallop; Respiratory: Breath sounds clear & equal bilaterally, No rales, rhonchi, wheezes, or rub, Normal respiratory effort/excursion; Chest: Nontender, Movement normal; Abdomen: Soft, Nontender, Nondistended, Normal bowel sounds; Genitourinary: No CVA tenderness; Extremities: Pulses normal, No tenderness, No edema, No calf edema or asymmetry.; Neuro: AA&Ox3, Major CN grossly intact.  No facial droop, speech clear.  Moves all ext on stretcher.; Skin: Color normal, Warm, Dry, no rash.    ED Course  Procedures  MDM  MDM Reviewed: nursing note and vitals Interpretation: labs and x-ray   Results for orders placed during the hospital encounter of 02/17/11  URINALYSIS, ROUTINE W REFLEX MICROSCOPIC      Component Value Range   Color, Urine RED (*) YELLOW    APPearance TURBID (*) CLEAR    Specific Gravity, Urine 1.012  1.005 - 1.030    pH 6.0  5.0 - 8.0    Glucose, UA NEGATIVE  NEGATIVE (mg/dL)   Hgb urine dipstick LARGE (*) NEGATIVE    Bilirubin Urine NEGATIVE  NEGATIVE    Ketones, ur TRACE (*) NEGATIVE (mg/dL)   Protein, ur >161 (*) NEGATIVE (mg/dL)   Urobilinogen, UA 0.2  0.0 - 1.0 (mg/dL)   Nitrite NEGATIVE  NEGATIVE    Leukocytes, UA LARGE (*) NEGATIVE   BASIC METABOLIC PANEL      Component Value Range   Sodium 127 (*) 135 - 145 (mEq/L)   Potassium 4.0  3.5 - 5.1 (mEq/L)   Chloride 94 (*) 96 - 112 (mEq/L)   CO2 21  19 - 32 (mEq/L)   Glucose, Bld 207 (*) 70 - 99 (mg/dL)   BUN 35 (*) 6 - 23 (mg/dL)    Creatinine, Ser 0.96 (*) 0.50 - 1.10 (mg/dL)   Calcium 9.2  8.4 - 04.5 (mg/dL)   GFR calc non Af Amer 24 (*) >90 (mL/min)   GFR calc Af Amer 28 (*) >90 (mL/min)  CBC      Component Value Range   WBC 8.3  4.0 - 10.5 (K/uL)   RBC  3.21 (*) 3.87 - 5.11 (MIL/uL)   Hemoglobin 10.0 (*) 12.0 - 15.0 (g/dL)   HCT 09.6 (*) 04.5 - 46.0 (%)   MCV 90.7  78.0 - 100.0 (fL)   MCH 31.2  26.0 - 34.0 (pg)   MCHC 34.4  30.0 - 36.0 (g/dL)   RDW 40.9  81.1 - 91.4 (%)   Platelets 212  150 - 400 (K/uL)  DIFFERENTIAL      Component Value Range   Neutrophils Relative 80 (*) 43 - 77 (%)   Lymphocytes Relative 8 (*) 12 - 46 (%)   Monocytes Relative 12  3 - 12 (%)   Eosinophils Relative 0  0 - 5 (%)   Basophils Relative 0  0 - 1 (%)   Neutro Abs 6.6  1.7 - 7.7 (K/uL)   Lymphs Abs 0.7  0.7 - 4.0 (K/uL)   Monocytes Absolute 1.0  0.1 - 1.0 (K/uL)   Eosinophils Absolute 0.0  0.0 - 0.7 (K/uL)   Basophils Absolute 0.0  0.0 - 0.1 (K/uL)   RBC Morphology POLYCHROMASIA PRESENT     WBC Morphology VACUOLATED NEUTROPHILS     Smear Review LARGE PLATELETS PRESENT    LACTIC ACID, PLASMA      Component Value Range   Lactic Acid, Venous 1.3  0.5 - 2.2 (mmol/L)  URINE MICROSCOPIC-ADD ON      Component Value Range   WBC, UA TOO NUMEROUS TO COUNT  <3 (WBC/hpf)   Bacteria, UA MANY (*) RARE    Urine-Other FIELD OBSCURED BY WBC'S    GLUCOSE, CAPILLARY      Component Value Range   Glucose-Capillary 167 (*) 70 - 99 (mg/dL)   Comment 1 Notify RN     Results for Kathryn [name], Kathryn [name] (MRN [id number]) as of 02/17/2011 22:00  Ref. Range 11/15/2010 13:45 01/01/2011 15:18 01/22/2011 13:31 02/17/2011 16:02  BUN Latest Range: 6-23 mg/dL 43 (H) 27 (H) 23 35 (H)  Creat Latest Range: 0.50-1.10 mg/dL 0.86 (H) 1.5 (H) 5.78 (H) 1.95 (H)   Results for Kathryn [name], Kathryn [name] (MRN [id number]) as of 02/17/2011 22:00  Ref. Range 11/15/2010 13:45 01/01/2011 15:18 01/22/2011 13:31 02/05/2011 16:49 02/17/2011 16:02  HGB Latest Range: 12.0-15.0 g/dL 9.4 (L)  9.5 (L) 9.3 (L) 9.7 (L) 10.0 (L)  HCT Latest Range: 36.0-46.0 % 27.4 (L) 28.0 (L) 27.9 (L) 29.2 (L) 29.1 (L)    Dg Chest 2 View  02/17/2011  *RADIOLOGY REPORT*  Clinical Data: Fever.  Rule out infiltrate  CHEST - 2 VIEW  Comparison: 10/10/2010  Findings: Cardiac enlargement without heart failure or edema.  No pleural effusion.  Negative for pneumonia.  IMPRESSION: No acute abnormality.  Original Report Authenticated By: Camelia Phenes, M.D.    2130:  APAP given for fever.  IV rocephin for UTI, UC pending.  Anemia per baseline.  BUN/Cr elevated from baseline, Na decreased from baseline.  Pt lives alone, mostly wheelchair bound, minimal assist for few hours daily by Home Health.  Was recently d/c from [facility].  Concern with d/c and her ability to care for herself at home; family shares this concern.  Dx testing d/w pt and family.  Questions answered.  Verb understanding, agreeable to admit.   9:45 PM:  T/C to Dr. Jacky Kindle on call for Dr. Evlyn Kanner, case discussed, including:  HPI, pertinent PM/SHx, VS/PE, dx testing, ED course and treatment.  Agreeable to eval pt in ED.  Requests to keep family here until he evals pt.  Family made aware and agreeable.  Addisson Frate Allison Quarry, DO 02/19/11 1216

## 2011-02-17 NOTE — ED Notes (Signed)
EDP [facility] notified of pt's temp 102.6. Pt medicated with tylenol per protocol

## 2011-02-17 NOTE — ED Notes (Signed)
Pt turned to left side- temp rechecked

## 2011-02-18 ENCOUNTER — Encounter (HOSPITAL_COMMUNITY): Payer: Self-pay

## 2011-02-18 LAB — CBC
Hemoglobin: 10 g/dL — ABNORMAL LOW (ref 12.0–15.0)
MCH: 30.8 pg (ref 26.0–34.0)
MCHC: 33.9 g/dL (ref 30.0–36.0)
Platelets: 203 10*3/uL (ref 150–400)
RDW: 14.7 % (ref 11.5–15.5)

## 2011-02-18 LAB — GLUCOSE, CAPILLARY
Glucose-Capillary: 177 mg/dL — ABNORMAL HIGH (ref 70–99)
Glucose-Capillary: 218 mg/dL — ABNORMAL HIGH (ref 70–99)
Glucose-Capillary: 340 mg/dL — ABNORMAL HIGH (ref 70–99)
Glucose-Capillary: 250 mg/dL — ABNORMAL HIGH (ref 70–99)

## 2011-02-18 LAB — BASIC METABOLIC PANEL
BUN: 33 mg/dL — ABNORMAL HIGH (ref 6–23)
Calcium: 8.7 mg/dL (ref 8.4–10.5)
Creatinine, Ser: 1.86 mg/dL — ABNORMAL HIGH (ref 0.50–1.10)
GFR calc Af Amer: 29 mL/min — ABNORMAL LOW (ref >90)
GFR calc non Af Amer: 25 mL/min — ABNORMAL LOW (ref >90)
Potassium: 3.6 mEq/L (ref 3.5–5.1)

## 2011-02-18 MED ORDER — NON FORMULARY: 20.0000 mg | Status: DC

## 2011-02-18 MED ORDER — LOSARTAN POTASSIUM 50 MG PO TABS
100.0000 mg | ORAL_TABLET | Freq: Every day | ORAL | Status: DC
  Administered 2011-02-18 – 2011-02-24 (×7): 100 mg via ORAL
  Filled 2011-02-18 (×9): qty 2

## 2011-02-18 MED ORDER — OCUVITE-LUTEIN PO CAPS
1.0000 | ORAL_CAPSULE | Freq: Every day | ORAL | Status: DC
  Administered 2011-02-18 – 2011-02-24 (×7): 1 via ORAL
  Filled 2011-02-18 (×9): qty 1

## 2011-02-18 MED ORDER — ACETAMINOPHEN 325 MG PO TABS
650.0000 mg | ORAL_TABLET | Freq: Four times a day (QID) | ORAL | Status: DC | PRN
  Administered 2011-02-18: 650 mg via ORAL
  Filled 2011-02-18: qty 2

## 2011-02-18 MED ORDER — INSULIN GLARGINE 100 UNIT/ML ~~LOC~~ SOLN
20.0000 [IU] | SUBCUTANEOUS | Status: DC
  Administered 2011-02-18 – 2011-02-19 (×2): 20 [IU] via SUBCUTANEOUS
  Filled 2011-02-18: qty 3
  Filled 2011-02-18: qty 1

## 2011-02-18 MED ORDER — ATORVASTATIN CALCIUM 10 MG PO TABS
20.0000 mg | ORAL_TABLET | Freq: Every day | ORAL | Status: DC
  Administered 2011-02-18 – 2011-02-23 (×7): 20 mg via ORAL
  Filled 2011-02-18 (×9): qty 2

## 2011-02-18 MED ORDER — POLYETHYLENE GLYCOL 3350 17 G PO PACK
17.0000 g | PACK | Freq: Every day | ORAL | Status: DC
  Administered 2011-02-19 – 2011-02-24 (×6): 17 g via ORAL
  Filled 2011-02-18 (×8): qty 1

## 2011-02-18 MED ORDER — METOPROLOL TARTRATE 25 MG PO TABS
25.0000 mg | ORAL_TABLET | Freq: Two times a day (BID) | ORAL | Status: DC
  Administered 2011-02-18 – 2011-02-24 (×13): 25 mg via ORAL
  Filled 2011-02-18 (×17): qty 1

## 2011-02-18 MED ORDER — ACETAMINOPHEN 650 MG RE SUPP: 650.0000 mg | Freq: Four times a day (QID) | RECTAL | Status: DC | PRN

## 2011-02-18 MED ORDER — SODIUM CHLORIDE 0.9 % IV SOLN
INTRAVENOUS | Status: DC
  Administered 2011-02-18 – 2011-02-23 (×11): via INTRAVENOUS
  Administered 2011-02-24: 1000 mL via INTRAVENOUS

## 2011-02-18 MED ORDER — FERROUS SULFATE 325 (65 FE) MG PO TABS
325.0000 mg | ORAL_TABLET | Freq: Every day | ORAL | Status: DC
  Administered 2011-02-18 – 2011-02-24 (×7): 325 mg via ORAL
  Filled 2011-02-18 (×8): qty 1

## 2011-02-18 MED ORDER — ZOLPIDEM TARTRATE 5 MG PO TABS
5.0000 mg | ORAL_TABLET | Freq: Every evening | ORAL | Status: DC | PRN
  Administered 2011-02-19 – 2011-02-23 (×5): 5 mg via ORAL
  Filled 2011-02-18 (×5): qty 1

## 2011-02-18 MED ORDER — THERA M PLUS PO TABS
1.0000 | ORAL_TABLET | Freq: Every day | ORAL | Status: DC
  Administered 2011-02-18 – 2011-02-24 (×7): 1 via ORAL
  Filled 2011-02-18 (×7): qty 1

## 2011-02-18 MED ORDER — RIVAROXABAN 10 MG PO TABS
20.0000 mg | ORAL_TABLET | Freq: Every day | ORAL | Status: DC
  Administered 2011-02-18 – 2011-02-23 (×6): 20 mg via ORAL
  Filled 2011-02-18 (×6): qty 2

## 2011-02-18 MED ORDER — PANTOPRAZOLE SODIUM 40 MG PO TBEC
40.0000 mg | DELAYED_RELEASE_TABLET | Freq: Every day | ORAL | Status: DC
  Administered 2011-02-18 – 2011-02-24 (×7): 40 mg via ORAL
  Filled 2011-02-18 (×8): qty 1

## 2011-02-18 MED ORDER — ASPIRIN EC 81 MG PO TBEC
81.0000 mg | DELAYED_RELEASE_TABLET | Freq: Every day | ORAL | Status: DC
  Administered 2011-02-18 – 2011-02-23 (×6): 81 mg via ORAL
  Filled 2011-02-18 (×7): qty 1

## 2011-02-18 MED ORDER — CALCIUM CARBONATE-VITAMIN D 500-200 MG-UNIT PO TABS
1.0000 | ORAL_TABLET | Freq: Two times a day (BID) | ORAL | Status: DC
  Administered 2011-02-18 – 2011-02-24 (×12): 1 via ORAL
  Filled 2011-02-18 (×16): qty 1

## 2011-02-18 MED ORDER — DEXTROSE 5 % IV SOLN
1.0000 g | INTRAVENOUS | Status: DC
  Administered 2011-02-18 – 2011-02-24 (×7): 1 g via INTRAVENOUS
  Filled 2011-02-18 (×9): qty 10

## 2011-02-18 MED ORDER — SOLIFENACIN SUCCINATE 5 MG PO TABS
10.0000 mg | ORAL_TABLET | Freq: Every day | ORAL | Status: DC
  Administered 2011-02-18 – 2011-02-24 (×7): 10 mg via ORAL
  Filled 2011-02-18 (×8): qty 2

## 2011-02-18 MED ORDER — HYDROCODONE-ACETAMINOPHEN 5-325 MG PO TABS
1.0000 | ORAL_TABLET | ORAL | Status: DC | PRN
  Filled 2011-02-18: qty 1

## 2011-02-18 NOTE — ED Notes (Signed)
MD at bedside. 

## 2011-02-18 NOTE — ED Notes (Signed)
CBG- 340 

## 2011-02-18 NOTE — ED Notes (Signed)
Febrile temp 100.9/orem, pt refused to take tylenol, will recheck temp

## 2011-02-18 NOTE — ED Notes (Signed)
Pt states " i will take tylenol later"

## 2011-02-18 NOTE — Progress Notes (Addendum)
Patient arrived to room and oriented to unit.  Vitals taken, BP slightly elevated.  Hematuria evident in patient's brief.  HR irregular upon initial assessment, CBG of 208 at 1700.  Patient states she fell the Weds. before Thanksgiving.  Patient is also requesting that MD "do something" about her heels - skin boggy, reddened and non-blanching.  Prevalon boots and/or air overlay mattress will require an MD order.  Otherwise, Allevyn bandages are within the RN scope.  For now, heels are floated.  Please address pressure-relieving devices and consider EKG if appropriate (patient states that she has a history of atrial fibrillation).  Thank you, Roney Mans. RN, BSN 02/18/2011 6:07 PM

## 2011-02-18 NOTE — Progress Notes (Signed)
Subjective: Still in the ER. Some sweats and weakness. Still some dysuria. No CP or SOB. Hasn't really eaten anything.    Objective: Vital signs in last 24 hours: Temp:  [98.7 F (37.1 C)-102.6 F (39.2 C)] 100.2 F (37.9 C) (12/13 0503) Pulse Rate:  [66-123] 66  (12/13 0503) Resp:  [18-20] 18  (12/13 0421) BP: (127-163)/(59-99) 145/75 mmHg (12/13 0503) SpO2:  [97 %-100 %] 97 % (12/13 0503)  Intake/Output from previous day:   Intake/Output this shift:    General: alert looks flushed. Oral membranes moist. Neck supple. Lungs clear. Ht tachy IR IR, abd obese soft NT, Extrem 1+ edema, diminished pulses. Alert, mentating well, no tremor  Lab Results   [facility] 02/18/11 0602 02/17/11 1602  WBC 10.5 8.3  RBC 3.25* 3.21*  HGB 10.0* 10.0*  HCT 29.5* 29.1*  MCV 90.8 90.7  MCH 30.8 31.2  RDW 14.7 14.8  PLT 203 212    Basename 02/18/11 0602 02/17/11 1602  NA 129* 127*  K 3.6 4.0  CL 98 94*  CO2 20 21  GLUCOSE 221* 207*  BUN 33* 35*  CREATININE 1.86* 1.95*  CALCIUM 8.7 9.2    Studies/Results: Dg Chest 2 View  02/17/2011  *RADIOLOGY REPORT*  Clinical Data: Fever.  Rule out infiltrate  CHEST - 2 VIEW  Comparison: 10/10/2010  Findings: Cardiac enlargement without heart failure or edema.  No pleural effusion.  Negative for pneumonia.  IMPRESSION: No acute abnormality.  Original Report Authenticated By: Camelia Phenes, M.D.    Scheduled Meds:   . sodium chloride   Intravenous Once  . acetaminophen  650 mg Oral Once  . aspirin EC  81 mg Oral Daily  . atorvastatin  20 mg Oral q1800  . calcium-vitamin D  1 tablet Oral BID  . cefTRIAXone (ROCEPHIN)  IV  1 g Intravenous Once  . cefTRIAXone (ROCEPHIN)  IV  1 g Intravenous Q24H  . ferrous sulfate  325 mg Oral Q breakfast  . insulin aspart  0-15 Units Subcutaneous TID WC  . insulin glargine  20 Units Subcutaneous Q0700  . losartan  100 mg Oral Daily  . metoprolol tartrate  25 mg Oral BID  . multivitamin-lutein  1 capsule Oral  Daily  . multivitamins ther. w/minerals  1 tablet Oral Daily  . pantoprazole  40 mg Oral Daily  . polyethylene glycol  17 g Oral Daily  . rivaroxaban  20 mg Oral Daily  . solifenacin  10 mg Oral Daily  . DISCONTD: NON FORMULARY 20 mg  20 mg Oral 1 day or 1 dose   Continuous Infusions:   . sodium chloride 75 mL/hr at 02/18/11 0713   PRN Meds:acetaminophen, acetaminophen, HYDROcodone-acetaminophen, zolpidem  Assessment/Plan: Patient Active Problem List  Diagnoses  . DM: BS up some at 219  . HYPERCHOLESTEROLEMIA  IIA: on Rx  . MACULAR DEGENERATION: limited vision  . HYPERTENSION, UNSPECIFIED: BP fair  . ATRIAL FIBRILLATION: A bit fast  . OSTEOARTHRITIS: some pain  . OSTEOPOROSIS  . EDEMA: better than her baseline  . Anemia: Hgb at 10  . UTI (urinary tract infection): getting Rx with rocephin Hyponatremia: sl better DM Nephropathy: Sl better with hydration      LOS: 1 day   Sabre Leonetti [name] 02/18/2011, 8:37 AM

## 2011-02-18 NOTE — ED Notes (Signed)
Vital signs stable. 

## 2011-02-18 NOTE — ED Notes (Signed)
Patient is resting comfortably. 

## 2011-02-18 NOTE — ED Notes (Signed)
Patient denies pain and is resting comfortably.  

## 2011-02-19 LAB — CBC
MCH: 31.1 pg (ref 26.0–34.0)
MCHC: 33.6 g/dL (ref 30.0–36.0)
MCV: 92.7 fL (ref 78.0–100.0)
Platelets: 203 10*3/uL (ref 150–400)
RDW: 15 % (ref 11.5–15.5)

## 2011-02-19 LAB — GLUCOSE, CAPILLARY
Glucose-Capillary: 182 mg/dL — ABNORMAL HIGH (ref 70–99)
Glucose-Capillary: 182 mg/dL — ABNORMAL HIGH (ref 70–99)
Glucose-Capillary: 265 mg/dL — ABNORMAL HIGH (ref 70–99)
Glucose-Capillary: 227 mg/dL — ABNORMAL HIGH (ref 70–99)

## 2011-02-19 LAB — URINE CULTURE: Culture  Setup Time: 201212122233

## 2011-02-19 LAB — COMPREHENSIVE METABOLIC PANEL
ALT: 20 U/L (ref 0–35)
AST: 25 U/L (ref 0–37)
Calcium: 8.3 mg/dL — ABNORMAL LOW (ref 8.4–10.5)
Creatinine, Ser: 1.88 mg/dL — ABNORMAL HIGH (ref 0.50–1.10)
GFR calc non Af Amer: 25 mL/min — ABNORMAL LOW (ref >90)
Sodium: 131 mEq/L — ABNORMAL LOW (ref 135–145)
Total Protein: 5.6 g/dL — ABNORMAL LOW (ref 6.0–8.3)

## 2011-02-19 LAB — MRSA PCR SCREENING: MRSA by PCR: POSITIVE — AB

## 2011-02-19 MED ORDER — INSULIN GLARGINE 100 UNIT/ML ~~LOC~~ SOLN
25.0000 [IU] | SUBCUTANEOUS | Status: DC
  Administered 2011-02-20 – 2011-02-21 (×2): 25 [IU] via SUBCUTANEOUS

## 2011-02-19 NOTE — Progress Notes (Addendum)
CSW spoke with the pt and completed psychosocial assessment (pls see shadow chart). Pt reports she has support from her son, but he works during the day as a Runner, broadcasting/film/video. Pt reports she just discharged from [facility] before Thanksgiving and she would like to return there for rehab. CSW has called the facility to see if the pts still has covered days per her insurance to return to SNF, CSW awaiting a return call. Pt reports her husband is paralyzed and has been a resident at [facility] for over a year. Pt expressed how emotionally it has been accepting her husbands diagnosis and the fact that her father died a month before her husband became paralyzed.  CSW provided pt with the list of facilities in [facility]. Pt reports she is very tired and would like to get some rest. CSW will continue to check for PT/OT evaluation and recommendations so Fl-2 can be completed and faxed out. CSW will continue to follow the pt and offer support.  Patrice Paradise, LCSWA 02/19/2011 1:14 PM [phone]

## 2011-02-19 NOTE — Progress Notes (Signed)
02/19/11, Kathi Der RNC-MNN, BSN, [phone].  CM received referral for Equipment/DME.  CSW is currently seeing patient for possible return to SNF.  Will await determination for return to SNF and follow for needs.

## 2011-02-19 NOTE — Progress Notes (Signed)
Physical Therapy Evaluation Patient Details Name: Kathryn [name] MRN: [id number] DOB: 1933/08/18 Today's Date: 02/19/2011 15:00-15:30 EVII  Recommend continued PT and SNF.  Pt would also benefit from air overlay mattress. Please order if you agree.  Problem List:  Patient Active Problem List  Diagnoses  . DM  . HYPERCHOLESTEROLEMIA  IIA  . MACULAR DEGENERATION  . HYPERTENSION, UNSPECIFIED  . ATRIAL FIBRILLATION  . OSTEOARTHRITIS  . OSTEOPOROSIS  . EDEMA  . Anemia  . UTI (urinary tract infection)    Past Medical History:  Past Medical History  Diagnosis Date  . Osteoporosis   . DM (diabetes mellitus)   . HTN (hypertension)     unspecified  . Atrial fibrillation   . Macular degeneration   . Osteoarthritis   . Edema   . Anemia   . Hydronephrosis     left  . UTI (lower urinary tract infection)    Past Surgical History:  Past Surgical History  Procedure Date  . Replacement total knee 08/2007    bilateral  . Appendectomy   . Cholecystectomy   . Colonoscopy   . Upper gastrointestinal endoscopy     PT Assessment/Plan/Recommendation PT Assessment Clinical Impression Statement: pt with multiple medical problems including UTI, arthritis, Bilat TKR with decreased ROM and strength, decreased use of left arm, skin  impairment at sacrum and heel, diffuse areas of pain, and dependence in mobility. She will need 24/7 care at tot assist level and will benefit from continured PT and SNF PT Recommendation/Assessment: Patient will need skilled PT in the acute care venue PT Problem List: Decreased strength;Decreased range of motion;Decreased activity tolerance;Decreased mobility;Obesity;Decreased skin integrity;Pain Problem List Comments: pt needs much encouragement for participation in PT Barriers to Discharge: Decreased caregiver support PT Therapy Diagnosis : Generalized weakness;Acute pain;Difficulty walking PT Plan PT Frequency: Min 3X/week PT Treatment/Interventions: DME  instruction;Functional mobility training;Therapeutic activities;Therapeutic exercise;Patient/family education PT Recommendation Recommendations for Other Services: OT consult Follow Up Recommendations: Skilled nursing facility;24 hour supervision/assistance Equipment Recommended: Defer to next venue PT Goals  Acute Rehab PT Goals PT Goal Formulation: With patient Time For Goal Achievement: 2 weeks Pt will Roll Supine to Right Side: with mod assist PT Goal: Rolling Supine to Right Side - Progress: Not met Pt will Roll Supine to Left Side: with mod assist PT Goal: Rolling Supine to Left Side - Progress: Not met Pt will go Supine/Side to Sit: with mod assist PT Goal: Supine/Side to Sit - Progress: Not met Pt will Sit at Edge of Bed: Independently;6-10 min;with no upper extremity support PT Goal: Sit at Edge Of Bed - Progress: Not met Pt will go Sit to Supine/Side: with mod assist PT Goal: Sit to Supine/Side - Progress: Not met Pt will go Sit to Stand: with +2 total assist (pt =50%) PT Goal: Sit to Stand - Progress: Not met  PT Evaluation Precautions/Restrictions    Prior Functioning  Home Living Lives With: Alone Receives Help From:  (life alert button) Home Adaptive Equipment: Bedside commode/3-in-1;Wheelchair - manual Additional Comments: pt reports she was at a transfer to wheelchair level. Did not get other specific information. Pt states she fell at home just before Thanksgiving   Prior Function Level of Independence: Requires assistive device for independence Cognition Cognition Arousal/Alertness: Awake/alert Overall Cognitive Status:  (pt extremely talkative) Orientation Level: Oriented X4 Cognition - Other Comments: pt needs much encouragement, focused on her multiple medical problems and the medical problems of her husband Sensation/Coordination   Extremity Assessment LUE Assessment LUE Assessment:  Exceptions to [facility] LUE AROM (degrees) LUE Overall AROM Comments: pt  with weakness in left shoulder due to ?  pt reports she can't use her left arm RLE Assessment RLE Assessment: Exceptions to [facility] RLE AROM (degrees) RLE Overall AROM Comments: Pt lacks about 30 degress to full extension, able to activate quads with 3/5 strength, pt c/o soreness at heel, generalized muscle atrophy LLE Assessment LLE Assessment: Exceptions to [facility] LLE AROM (degrees) LLE Overall AROM Comments: pt lacks about 25 degress to full extension, muscle atrophy with about 3/5 quad strength Mobility (including Balance) Bed Mobility Rolling Right: 1: +2 Total assist;Patient percentage (comment) (pt < 10 %) Rolling Right Details (indicate cue type and reason): needs assist to place leg up to push and pull hip over with pad.  limited assist with left arm from previous problem Rolling Left: 1: +2 Total assist;Patient percentage (comment) (pt ~ 25%) Rolling Left Details (indicate cue type and reason): assist needed to place right foot up and pull hip over with pad Left Sidelying to Sit: 1: +2 Total assist;HOB elevated (comment degrees) (60%, pt ~10% to lift upper body up from bed) Sit to Supine - Left: 1: +2 Total assist (pt ~ 0%) Scooting to HOB: 1: +2 Total assist;7: Independent (pt~0%) Transfers Transfers: No Ambulation/Gait Ambulation/Gait: No  Posture/Postural Control Posture/Postural Control: Postural limitations Postural Limitations: pt with generalized muscle atrophy, decreased extension at knees, decreased active trunk flexion and extension,  Pt is able to sit at EOB unsupported for about 5 minutes Static Sitting Balance Static Sitting - Balance Support: No upper extremity supported;Feet supported Static Sitting - Level of Assistance: 6: Modified independent (Device/Increase time) Static Sitting - Comment/# of Minutes: ~ 5 minutes Exercise    End of Session PT - End of Session Activity Tolerance: Patient limited by pain;Treatment limited secondary to medical complications  (Comment);Other (comment) ( multiple c/o arthritis, UTI, "I can't walk", pain) Patient left: in bed Nurse Communication:  (pt incontinent of urine) General Behavior During Session:  (pt needee multiple redirection to task ) Cognition:  (pt needs encouragement for mobility, dependent mobility)  Donnetta Hail 02/19/2011, 4:27 PM

## 2011-02-19 NOTE — Progress Notes (Signed)
Chart reviewed and UR completed. 

## 2011-02-19 NOTE — Progress Notes (Signed)
Subjective: Had a good night. Some pain from decubiti when moved. No SOB Still having some hematuria. Less pain. Very weak   Objective: Vital signs in last 24 hours: Temp:  [98 F (36.7 C)-100.3 F (37.9 C)] 98.1 F (36.7 C) (12/14 0500) Pulse Rate:  [77-93] 93  (12/14 0500) Resp:  [15-18] 15  (12/14 0500) BP: (132-148)/(51-83) 142/56 mmHg (12/14 0500) SpO2:  [97 %-99 %] 99 % (12/14 0500) Weight:  [107.7 kg (237 lb 7 oz)] 237 lb 7 oz (107.7 kg) (12/13 1745)  Intake/Output from previous day: 12/13 0701 - 12/14 0700 In: 240 [P.O.:240] Out: -  Intake/Output this shift:    General: alert less flushed. Oral membranes moist. Neck supple. Lungs clear. Ht tachy IR IR, abd obese soft NT, Extrem 1+ edema, diminished pulses. Alert, mentating well, no tremor  Lab Results   [facility] 02/19/11 0345 02/18/11 0602  WBC 8.3 10.5  RBC 2.86* 3.25*  HGB 8.9* 10.0*  HCT 26.5* 29.5*  MCV 92.7 90.8  MCH 31.1 30.8  RDW 15.0 14.7  PLT 203 203    Basename 02/19/11 0345 02/18/11 0602  NA 131* 129*  K 3.4* 3.6  CL 102 98  CO2 21 20  GLUCOSE 201* 221*  BUN 33* 33*  CREATININE 1.88* 1.86*  CALCIUM 8.3* 8.7    Studies/Results: Dg Chest 2 View  02/17/2011  *RADIOLOGY REPORT*  Clinical Data: Fever.  Rule out infiltrate  CHEST - 2 VIEW  Comparison: 10/10/2010  Findings: Cardiac enlargement without heart failure or edema.  No pleural effusion.  Negative for pneumonia.  IMPRESSION: No acute abnormality.  Original Report Authenticated By: Camelia Phenes, M.D.    Scheduled Meds:   . aspirin EC  81 mg Oral Daily  . atorvastatin  20 mg Oral q1800  . calcium-vitamin D  1 tablet Oral BID  . cefTRIAXone (ROCEPHIN)  IV  1 g Intravenous Q24H  . ferrous sulfate  325 mg Oral Q breakfast  . insulin aspart  0-15 Units Subcutaneous TID WC  . insulin glargine  20 Units Subcutaneous Q0700  . losartan  100 mg Oral Daily  . metoprolol tartrate  25 mg Oral BID  . multivitamin-lutein  1 capsule Oral Daily    . multivitamins ther. w/minerals  1 tablet Oral Daily  . pantoprazole  40 mg Oral Daily  . polyethylene glycol  17 g Oral Daily  . rivaroxaban  20 mg Oral Daily  . solifenacin  10 mg Oral Daily   Continuous Infusions:   . sodium chloride 75 mL/hr at 02/18/11 2357   PRN Meds:acetaminophen, acetaminophen, HYDROcodone-acetaminophen, zolpidem  Assessment/Plan:    Patient Active Problem List   Diagnoses   .  DM: BS sl better at 182  .  HYPERCHOLESTEROLEMIA IIA: on Rx   .  MACULAR DEGENERATION: limited vision   .  HYPERTENSION, UNSPECIFIED: BP better  .  ATRIAL FIBRILLATION: A bit fast   .  OSTEOARTHRITIS: some pain   .  OSTEOPOROSIS   .  EDEMA: better than her baseline   .  Anemia: Hgb at 8.9  .  UTI (urinary tract infection): getting Rx with rocephin . ? Covering OK. Still has hematuria. Tmax 100.3 Hyponatremia: sl better at 131 DM Nephropathy: Stable at 1.89 Decubiti: wound care nurse to see Disp: to [facility] place when stable (Husband is there long term)     LOS: 2 days   Kavian Peters [name] 02/19/2011, 9:59 AM

## 2011-02-19 NOTE — Progress Notes (Signed)
INITIAL ADULT NUTRITION ASSESSMENT Date: 02/19/2011   Time: 1:38 PM Reason for Assessment: Low braden  ASSESSMENT: Female 77 y.o.  Dx: UTI (urinary tract infection)  Hx:  Past Medical History  Diagnosis Date  . Osteoporosis   . DM (diabetes mellitus)   . HTN (hypertension)     unspecified  . Atrial fibrillation   . Macular degeneration   . Osteoarthritis   . Edema   . Anemia   . Hydronephrosis     left  . UTI (lower urinary tract infection)    Related Meds:  Scheduled Meds:   . aspirin EC  81 mg Oral Daily  . atorvastatin  20 mg Oral q1800  . calcium-vitamin D  1 tablet Oral BID  . cefTRIAXone (ROCEPHIN)  IV  1 g Intravenous Q24H  . ferrous sulfate  325 mg Oral Q breakfast  . insulin aspart  0-15 Units Subcutaneous TID WC  . insulin glargine  25 Units Subcutaneous Q24H  . losartan  100 mg Oral Daily  . metoprolol tartrate  25 mg Oral BID  . multivitamin-lutein  1 capsule Oral Daily  . multivitamins ther. w/minerals  1 tablet Oral Daily  . pantoprazole  40 mg Oral Daily  . polyethylene glycol  17 g Oral Daily  . rivaroxaban  20 mg Oral Daily  . solifenacin  10 mg Oral Daily  . DISCONTD: insulin glargine  20 Units Subcutaneous Q0700   Continuous Infusions:   . sodium chloride 75 mL/hr at 02/19/11 1335   PRN Meds:.acetaminophen, acetaminophen, HYDROcodone-acetaminophen, zolpidem  Ht: 5\' 10"  (177.8 cm)  Wt: 237 lb 7 oz (107.7 kg)  Ideal Wt: 68.2kg % Ideal Wt: 158  Usual Wt: Stable PTA  Body mass index is 34.07 kg/(m^2).  Food/Nutrition Related Hx: Pt mostly wheelchair bound PTA per H&P. Pt reports eating well PTA despite not having an appetite. Pt reports 50-60 pounds intentional weight loss in the past 2 years and stated she used a medication called Byetta which helped her lose weight, however states she is not on this anymore. Noted pt admitted with stage 2 pressure ulcer on sacrum. Pt denies any problems chewing or swallowing or being on any nutritional  supplements PTA. Pt ate 100% of dinner last night.   Labs:  CMP     Component Value Date/Time   NA 131* 02/19/2011 0345   K 3.4* 02/19/2011 0345   CL 102 02/19/2011 0345   CO2 21 02/19/2011 0345   GLUCOSE 201* 02/19/2011 0345   BUN 33* 02/19/2011 0345   CREATININE 1.88* 02/19/2011 0345   CALCIUM 8.3* 02/19/2011 0345   CALCIUM 8.2* 10/10/2010 1613   PROT 5.6* 02/19/2011 0345   ALBUMIN 1.9* 02/19/2011 0345   AST 25 02/19/2011 0345   ALT 20 02/19/2011 0345   ALKPHOS 95 02/19/2011 0345   BILITOT 0.3 02/19/2011 0345   GFRNONAA 25* 02/19/2011 0345   GFRAA 29* 02/19/2011 0345   CBG (last 3)   Basename 02/19/11 1259 02/19/11 0909 02/19/11 0639  GLUCAP 265* 182* 182*   Lab Results  Component Value Date   HGBA1C 5.8* 10/11/2010    Intake/Output Summary (Last 24 hours) at 02/19/11 1342 Last data filed at 02/18/11 2033  Gross per 24 hour  Intake    240 ml  Output      0 ml  Net    240 ml    Diet Order: Carb Control  IVF:    sodium chloride Last Rate: 75 mL/hr at 02/19/11 1335  Estimated Nutritional Needs:   Kcal:2050-2350 Protein:80-100g Fluid:2-2.3L  NUTRITION DIAGNOSIS: -Increased nutrient needs (NI-5.1).  Status: Ongoing  RELATED TO: stage 2 pressure ulcer  AS EVIDENCE BY: wound care nurse documentation  MONITORING/EVALUATION(Goals): Pt to consume >90% of meals.  EDUCATION NEEDS: -Education needs addressed. Reviewed importance of nutrition, especially protein rich foods/beverages, in wound healing.   INTERVENTION: Encouraged continued increased intake. Pt not interested in nutritional supplements. Will monitor.  Dietitian # [phone]  DOCUMENTATION CODES Per approved criteria  -Obesity Unspecified    Marshall Cork 02/19/2011, 1:38 PM

## 2011-02-19 NOTE — Consult Note (Signed)
WOC consult Note Reason for Consult: Consult requested for sacral wound. Wound type: Stage 2 wound, pt states she has had this at home prior to admission. Pt constantly incontinent of large amount of bloody urine.  This is baking it difficult to promote healing to sacrum.  Also immobile and difficult to turn.   COULD BENEFIT FROM USE OF FOLEY TO PROTECT WOUND AND PROMOTE HEALING, please order if desired.  Pressure Ulcer POA: Yes Measurement: .8X.2X.2cm Wound bed: Beefy red Drainage (amount, consistency, odor) Mod yellow drainage, no odor Periwound: Patchy areas of partial thickness skin loss surrounding buttocks, consistent with moisture associated skin damage. Dressing procedure/placement/frequency: Aquacel to provide antimicrobial benefits and absorb drainage.  Foam dressing to protect from incontinence.  Barrier cream to protect buttocks from continued urinary incontinence.  Air mattress to reduce pressure. Will not plan to follow further unless re-consulted.  [address] RN, MSN, [place]  [phone] :

## 2011-02-19 NOTE — Progress Notes (Signed)
Inpatient Diabetes Program Recommendations  AACE/ADA: New Consensus Statement on Inpatient Glycemic Control (2009)  Target Ranges:  Prepandial:   less than 140 mg/dL      Peak postprandial:   less than 180 mg/dL (1-2 hours)      Critically ill patients:  140 - 180 mg/dL   Reason for Visit: Hyperglycemia greater than 200 mg/dL following meals  Inpatient Diabetes Program Recommendations Insulin - Basal: Noted increase in basal Lantus by 5 units to start tonight. Insulin - Meal Coverage: Please add meal coverage of 4 units tidwc. Pt eating 100% of meals. Takes 70/30 at home.  HgbA1C: Please document most recent results or order if none within past 3 months.  Note: ([phone])

## 2011-02-20 LAB — COMPREHENSIVE METABOLIC PANEL
ALT: 24 U/L (ref 0–35)
BUN: 25 mg/dL — ABNORMAL HIGH (ref 6–23)
Calcium: 8.5 mg/dL (ref 8.4–10.5)
GFR calc Af Amer: 34 mL/min — ABNORMAL LOW (ref >90)
Glucose, Bld: 186 mg/dL — ABNORMAL HIGH (ref 70–99)
Sodium: 130 mEq/L — ABNORMAL LOW (ref 135–145)
Total Protein: 5.9 g/dL — ABNORMAL LOW (ref 6.0–8.3)

## 2011-02-20 LAB — GLUCOSE, CAPILLARY
Glucose-Capillary: 191 mg/dL — ABNORMAL HIGH (ref 70–99)
Glucose-Capillary: 178 mg/dL — ABNORMAL HIGH (ref 70–99)

## 2011-02-20 LAB — CBC
Hemoglobin: 9.3 g/dL — ABNORMAL LOW (ref 12.0–15.0)
MCH: 30.3 pg (ref 26.0–34.0)
MCHC: 32.9 g/dL (ref 30.0–36.0)

## 2011-02-20 MED ORDER — CHLORHEXIDINE GLUCONATE CLOTH 2 % EX PADS
6.0000 | MEDICATED_PAD | Freq: Every day | CUTANEOUS | Status: AC
  Administered 2011-02-20 – 2011-02-24 (×5): 6 via TOPICAL

## 2011-02-20 MED ORDER — MUPIROCIN 2 % EX OINT
1.0000 "application " | TOPICAL_OINTMENT | Freq: Two times a day (BID) | CUTANEOUS | Status: DC
  Administered 2011-02-20 – 2011-02-23 (×8): 1 via NASAL
  Filled 2011-02-20: qty 22

## 2011-02-20 NOTE — Progress Notes (Signed)
Occupational Therapy Evaluation Patient Details Name: Kathryn [name] MRN: [id number] DOB: [birth year] Today's Date: 02/20/2011  Problem List:  Patient Active Problem List  Diagnoses  . DM  . HYPERCHOLESTEROLEMIA  IIA  . MACULAR DEGENERATION  . HYPERTENSION, UNSPECIFIED  . ATRIAL FIBRILLATION  . OSTEOARTHRITIS  . OSTEOPOROSIS  . EDEMA  . Anemia  . UTI (urinary tract infection)    Past Medical History:  Past Medical History  Diagnosis Date  . Osteoporosis   . DM (diabetes mellitus)   . HTN (hypertension)     unspecified  . Atrial fibrillation   . Macular degeneration   . Osteoarthritis   . Edema   . Anemia   . Hydronephrosis     left  . UTI (lower urinary tract infection)    Past Surgical History:  Past Surgical History  Procedure Date  . Replacement total knee 08/2007    bilateral  . Appendectomy   . Cholecystectomy   . Colonoscopy   . Upper gastrointestinal endoscopy     OT Assessment/Plan/Recommendation OT Assessment OT Recommendation/Assessment: Patient will need skilled OT in the acute care venue OT Problem List: Decreased strength;Decreased activity tolerance;Impaired balance (sitting and/or standing);Decreased knowledge of use of DME or AE;Pain;Decreased knowledge of precautions Barriers to Discharge: Decreased caregiver support Barriers to Discharge Comments: Pt at home alone, husband lives at [facility] OT Therapy Diagnosis : Generalized weakness OT Plan OT Frequency: Min 1X/week OT Treatment/Interventions: Self-care/ADL training;Therapeutic activities;Therapeutic exercise;Neuromuscular education;DME and/or AE instruction;Balance training;Patient/family education OT Recommendation Follow Up Recommendations: Skilled nursing facility Equipment Recommended: Defer to next venue Individuals Consulted Consulted and Agree with Results and Recommendations: Patient OT Goals Acute Rehab OT Goals OT Goal Formulation: With patient Time For Goal  Achievement: 1 day;2 weeks ADL Goals Pt Will Perform Grooming: with set-up;with supervision;Sitting, edge of bed Pt Will Perform Upper Body Bathing: with min assist;Sitting, edge of bed Pt Will Perform Upper Body Dressing: with min assist;Sitting, bed Pt Will Transfer to Toilet: with max assist;with DME;3-in-1 Pt Will Perform Toileting - Hygiene: with mod assist;Sitting on 3-in-1 or toilet  OT Evaluation Precautions/Restrictions  Restrictions Weight Bearing Restrictions: Yes Prior Functioning Home Living Type of Home: House Home Layout: One level Bathroom Shower/Tub: Tub/shower unit;Walk-in shower Bathroom Toilet: Standard Bathroom Accessibility: Yes How Accessible: Accessible via wheelchair Home Adaptive Equipment: Wheelchair - manual;Bedside commode/3-in-1 Prior Function Level of Independence: Requires assistive device for independence;Independent with homemaking with wheelchair;Needs assistance with ADLs;Other (comment) (pt stated that she could dress herself if she had to) Driving: No Vocation: Retired ADL ADL Eating/Feeding: Not assessed Grooming: Performed;Wash/dry hands;Wash/dry face;Minimal assistance;Brushing hair Where Assessed - Grooming: Supine, head of bed up Upper Body Bathing: Simulated;Moderate assistance Where Assessed - Upper Body Bathing: Supine, head of bed up Lower Body Bathing: +1 Total assistance Upper Body Dressing: Performed;Moderate assistance Where Assessed - Upper Body Dressing: Supine, head of bed up Lower Body Dressing: +1 Total assistance Toilet Transfer: +1 Total assistance Toileting - Clothing Manipulation: Not assessed Toileting - Hygiene: Not assessed Tub/Shower Transfer: Not assessed Tub/Shower Transfer Method: Not assessed ADL Comments: Pt states that HH aide and HH OT assist her with bathing on certain days of the week, but that she can dress herself when they are not there, can transfer to toilet and bed from w/c at  home Vision/Perception  Vision - History Baseline Vision: Wears glasses only for reading Patient Visual Report: No change from baseline Perception Perception: Within Functional Limits Cognition Cognition Arousal/Alertness: Awake/alert Overall Cognitive Status: Appears within functional  limits for tasks assessed Orientation Level: Oriented X4 Sensation/Coordination Sensation Light Touch: Appears Intact Coordination Gross Motor Movements are Fluid and Coordinated: Yes Fine Motor Movements are Fluid and Coordinated: Yes Extremity Assessment RUE Assessment RUE Assessment: Within Functional Limits (MMT 3/5) LUE Assessment LUE Assessment: Within Functional Limits (MMT 3/5) Mobility  Bed Mobility Bed Mobility: Yes Sup - Sit: Total A +1 Transfers Transfers: No Unable    End of Session OT - End of Session Activity Tolerance: Patient limited by fatigue;Patient limited by pain Patient left: in bed;with call bell in reach General Behavior During Session: [facility] for tasks performed Cognition: [facility] for tasks performed   Galen Manila 02/20/2011, 12:35 PM

## 2011-02-20 NOTE — Plan of Care (Signed)
Problem: Phase II Progression Outcomes Goal: Discharge plan established Pt will need ST SNF for further therapy needs after acute discharge

## 2011-02-20 NOTE — Progress Notes (Signed)
Subjective: Patient still feels overall weak, just had a large bowel movement and feels somewhat better, appetite intact but still somewhat subdued. Denies any shortness of breath, chest pain, increasing pain.  Objective: Vital signs in last 24 hours: Temp:  [98.6 F (37 C)-100.4 F (38 C)] 98.6 F (37 C) (12/15 0651) Pulse Rate:  [80-104] 104  (12/15 0651) Resp:  [16] 16  (12/15 0651) BP: (137-195)/(74-84) 195/81 mmHg (12/15 0651) SpO2:  [93 %-96 %] 93 % (12/15 0651) Weight change:  Last BM Date: 02/17/11  CBG (last 3)   Basename 02/20/11 1208 02/20/11 0754 02/19/11 2146  GLUCAP 271* 191* 227*    Intake/Output from previous day: 12/14 0701 - 12/15 0700 In: 2791.3 [P.O.:410; I.V.:2281.3; IV Piggyback:100] Out: 150 [Urine:150] Intake/Output this shift:   Physical exam Obese, appropriate, answering questions appropriately, no apparent distress, lying on an air support mattress Sclera anicteric, no oropharyngeal lesions, oral membranes moist Neck supple, no cervical lymphadenopathy Cardiovascular exam reveals regular rate with some ectopy Lungs clear to auscultation bilaterally when auscultated anteriorly Abdomen soft, nontender, nondistended Extremity exam reveals trace edema pedal pulses intact but diminished Patient can move all 4 extremities albeit weakly, no significant tremor or shakes as reported previously   Lab Results:  Basename 02/20/11 0343 02/19/11 0345  NA 130* 131*  K 3.7 3.4*  CL 102 102  CO2 20 21  GLUCOSE 186* 201*  BUN 25* 33*  CREATININE 1.63* 1.88*  CALCIUM 8.5 8.3*  MG -- --  PHOS -- --    Basename 02/20/11 0343 02/19/11 0345  AST 28 25  ALT 24 20  ALKPHOS 129* 95  BILITOT 0.2* 0.3  PROT 5.9* 5.6*  ALBUMIN 2.0* 1.9*    Basename 02/20/11 0343 02/19/11 0345 02/17/11 1602  WBC 7.7 8.3 --  NEUTROABS -- -- 6.6  HGB 9.3* 8.9* --  HCT 28.3* 26.5* --  MCV 92.2 92.7 --  PLT 244 203 --   No results found for this basename:  CKTOTAL:3,CKMB:3,CKMBINDEX:3,TROPONINI:3 in the last 72 hours No results found for this basename: TSH,T4TOTAL,FREET3,T3FREE,THYROIDAB in the last 72 hours No results found for this basename: VITAMINB12:2,FOLATE:2,FERRITIN:2,TIBC:2,IRON:2,RETICCTPCT:2 in the last 72 hours  Studies/Results: No results found.   Medications: Scheduled:   . aspirin EC  81 mg Oral Daily  . atorvastatin  20 mg Oral q1800  . calcium-vitamin D  1 tablet Oral BID  . cefTRIAXone (ROCEPHIN)  IV  1 g Intravenous Q24H  . Chlorhexidine Gluconate Cloth  6 each Topical Q0600  . ferrous sulfate  325 mg Oral Q breakfast  . insulin aspart  0-15 Units Subcutaneous TID WC  . insulin glargine  25 Units Subcutaneous Q24H  . losartan  100 mg Oral Daily  . metoprolol tartrate  25 mg Oral BID  . multivitamin-lutein  1 capsule Oral Daily  . multivitamins ther. w/minerals  1 tablet Oral Daily  . mupirocin ointment  1 application Nasal BID  . pantoprazole  40 mg Oral Daily  . polyethylene glycol  17 g Oral Daily  . rivaroxaban  20 mg Oral Daily  . solifenacin  10 mg Oral Daily   Continuous:   . sodium chloride 75 mL/hr at 02/20/11 0156    Assessment/Plan: Principal Problem:  *UTI (urinary tract infection) on appropriate treatment based culture results now diagnosed with MRSA and being treated appropriately per protocol  on Diabetes-uncontrolled with CBGs in excess of 200 with Lantus insulin increased this morning Hypertension stable Atrial fibrillation stable  arthritis arthritis Anemia-stable hemoglobin 9.3,  slightly improved Disposition physical therapy and occupational   have agreed for skilled nursing facility placement is the best disposition to be performed next week Decubiti wound care nurse to see Chronic kidney disease stable if not slightly improved    LOS: 3 days   Kamali Sakata R 02/20/2011, 12:43 PM

## 2011-02-20 NOTE — Plan of Care (Signed)
Problem: Consults Goal: Skin Care Protocol Initiated - if indicated If consults are not indicated, leave blank or document N/A  Outcome: Progressing Skin doing better with use of barrier cream

## 2011-02-21 LAB — CBC
MCH: 30.9 pg (ref 26.0–34.0)
MCHC: 33.2 g/dL (ref 30.0–36.0)
Platelets: 261 10*3/uL (ref 150–400)

## 2011-02-21 LAB — BASIC METABOLIC PANEL
BUN: 23 mg/dL (ref 6–23)
Calcium: 8.6 mg/dL (ref 8.4–10.5)
Creatinine, Ser: 1.47 mg/dL — ABNORMAL HIGH (ref 0.50–1.10)
GFR calc non Af Amer: 34 mL/min — ABNORMAL LOW (ref >90)
Glucose, Bld: 169 mg/dL — ABNORMAL HIGH (ref 70–99)

## 2011-02-21 LAB — GLUCOSE, CAPILLARY: Glucose-Capillary: 182 mg/dL — ABNORMAL HIGH (ref 70–99)

## 2011-02-21 MED ORDER — INSULIN GLARGINE 100 UNIT/ML ~~LOC~~ SOLN
30.0000 [IU] | SUBCUTANEOUS | Status: DC
  Administered 2011-02-22 – 2011-02-24 (×3): 30 [IU] via SUBCUTANEOUS

## 2011-02-21 NOTE — Progress Notes (Signed)
Subjective: Patient still feels overall weak, had a large bowel movement yesterday, still somewhat better with no abdominal difficulties however continues to have some gross hematuria. Denies any chest pain, shortness of breath, increasing pain.  Objective: Vital signs in last 24 hours: Temp:  [98.3 F (36.8 C)-100.2 F (37.9 C)] 98.8 F (37.1 C) (12/16 0609) Pulse Rate:  [86-106] 86  (12/16 1200) Resp:  [16-17] 17  (12/16 0609) BP: (145-191)/(80-82) 145/81 mmHg (12/16 1200) SpO2:  [92 %-98 %] 94 % (12/16 0609) Weight change:  Last BM Date: 02/20/11  CBG (last 3)   Basename 02/21/11 1135 02/21/11 0732 02/20/11 2201  GLUCAP 260* 158* 194*    Intake/Output from previous day: 12/15 0701 - 12/16 0700 In: 3112.8 [P.O.:240; I.V.:2872.8] Out: -  Intake/Output this shift:   Physical exam Obese, appropriate, answering questions appropriately, no apparent distress, lying on an air support mattress Sclera anicteric, no oropharyngeal lesions, oral membranes moist Neck supple, no cervical lymphadenopathy Cardiovascular exam reveals irregularly irregular heart rhythm Lungs clear to auscultation bilaterally when auscultated anteriorly Abdomen soft, nontender, nondistended Extremity exam reveals trace edema pedal pulses intact but diminished Patient can move all 4 extremities albeit weakly, no significant tremor or shakes as reported previously   Lab Results:  Basename 02/21/11 0405 02/20/11 0343  NA 134* 130*  K 3.7 3.7  CL 104 102  CO2 21 20  GLUCOSE 169* 186*  BUN 23 25*  CREATININE 1.47* 1.63*  CALCIUM 8.6 8.5  MG -- --  PHOS -- --    Basename 02/20/11 0343 02/19/11 0345  AST 28 25  ALT 24 20  ALKPHOS 129* 95  BILITOT 0.2* 0.3  PROT 5.9* 5.6*  ALBUMIN 2.0* 1.9*    Basename 02/21/11 0405 02/20/11 0343  WBC 7.9 7.7  NEUTROABS -- --  HGB 9.3* 9.3*  HCT 28.0* 28.3*  MCV 93.0 92.2  PLT 261 244   No results found for this basename:  CKTOTAL:3,CKMB:3,CKMBINDEX:3,TROPONINI:3 in the last 72 hours No results found for this basename: TSH,T4TOTAL,FREET3,T3FREE,THYROIDAB in the last 72 hours No results found for this basename: VITAMINB12:2,FOLATE:2,FERRITIN:2,TIBC:2,IRON:2,RETICCTPCT:2 in the last 72 hours  Studies/Results: No results found.   Medications: Scheduled:    . aspirin EC  81 mg Oral Daily  . atorvastatin  20 mg Oral q1800  . calcium-vitamin D  1 tablet Oral BID  . cefTRIAXone (ROCEPHIN)  IV  1 g Intravenous Q24H  . Chlorhexidine Gluconate Cloth  6 each Topical Q0600  . ferrous sulfate  325 mg Oral Q breakfast  . insulin aspart  0-15 Units Subcutaneous TID WC  . insulin glargine  25 Units Subcutaneous Q24H  . losartan  100 mg Oral Daily  . metoprolol tartrate  25 mg Oral BID  . multivitamin-lutein  1 capsule Oral Daily  . multivitamins ther. w/minerals  1 tablet Oral Daily  . mupirocin ointment  1 application Nasal BID  . pantoprazole  40 mg Oral Daily  . polyethylene glycol  17 g Oral Daily  . rivaroxaban  20 mg Oral Daily  . solifenacin  10 mg Oral Daily   Continuous:    . sodium chloride 75 mL/hr at 02/21/11 2952    Assessment/Plan: Principal Problem:  *UTI (urinary tract infection) on appropriate treatment based culture results with Klebsiella pneumoniae essentially pansensitive except for ampicillin, now diagnosed with MRSA and being treated appropriately per protocol, patient questions continued hematuria.  on Diabetes-uncontrolled with CBGs in excess of 200 with Lantus insulin increased this morning, will increase Lantus insulin  again Hypertension stable Atrial fibrillation stable  arthritis arthritis Anemia-stable hemoglobin 9.3, slightly improved Disposition physical therapy and occupational   have agreed for skilled nursing facility placement is the best disposition to be performed next week Dec[facility] wound care nurse to see Chronic kidney disease stable if not slightly improved     LOS: 4 days   Javarius Tsosie R 02/21/2011, 3:19 PM

## 2011-02-21 NOTE — Plan of Care (Signed)
Problem: Phase I Progression Outcomes Goal: Initial discharge plan identified               Problem: Phase III Progression Outcomes Goal: Voiding independently Outcome: Not Progressing Pt. incontinent

## 2011-02-22 LAB — GLUCOSE, CAPILLARY
Glucose-Capillary: 148 mg/dL — ABNORMAL HIGH (ref 70–99)
Glucose-Capillary: 191 mg/dL — ABNORMAL HIGH (ref 70–99)
Glucose-Capillary: 194 mg/dL — ABNORMAL HIGH (ref 70–99)

## 2011-02-22 LAB — BASIC METABOLIC PANEL
CO2: 22 mEq/L (ref 19–32)
GFR calc non Af Amer: 39 mL/min — ABNORMAL LOW (ref >90)
Glucose, Bld: 155 mg/dL — ABNORMAL HIGH (ref 70–99)
Potassium: 3.7 mEq/L (ref 3.5–5.1)
Sodium: 137 mEq/L (ref 135–145)

## 2011-02-22 LAB — CBC
Hemoglobin: 9.4 g/dL — ABNORMAL LOW (ref 12.0–15.0)
MCH: 30.6 pg (ref 26.0–34.0)
RBC: 3.07 MIL/uL — ABNORMAL LOW (ref 3.87–5.11)
WBC: 6 10*3/uL (ref 4.0–10.5)

## 2011-02-22 NOTE — Progress Notes (Signed)
Physical Therapy Treatment Patient Details Name: Kathryn [name] MRN: [id number] DOB: 11/25/1933 Today's Date: 02/22/2011 Time: 7829-5621 Charge: TA 2 PT Assessment/Plan  PT - Assessment/Plan Comments on Treatment Session: Pt reported nausea upon arrival but agreeable to sit EOB. Pt reported feeling better since sitting up.  Pt agreeable to attempt transfer upon next visit. PT Plan: Discharge plan remains appropriate;Frequency remains appropriate Follow Up Recommendations: Skilled nursing facility Equipment Recommended: Defer to next venue PT Goals  Acute Rehab PT Goals PT Goal: Supine/Side to Sit - Progress: Progressing toward goal PT Goal: Sit at [facility] Of Bed - Progress: Progressing toward goal PT Goal: Sit to Supine/Side - Progress: Progressing toward goal  PT Treatment Precautions/Restrictions  Precautions Precautions: Fall Restrictions Weight Bearing Restrictions: Yes Mobility (including Balance) Bed Mobility Bed Mobility: Yes Rolling Right Details (indicate cue type and reason): pt=20%, verbal cues for  Sit to Supine - Left: Patient percentage (comment);1: +2 Total assist;With rail;HOB elevated (comment degrees) Sit to Supine - Left Details (indicate cue type and reason): pt=20%, verbal cues for technique, HOB 65*, assist for weakness Scooting to HOB: 1: +2 Total assist;Patient percentage (comment);With rail Scooting to [facility] Details (indicate cue type and reason): pt=10%, pt attempted to assist with pulling up with R UE (left with limited ROM), bed placed in trendelenberg position Transfers Transfers: No (pt declined 2* nausea earlier and "bloody urine")  Static Sitting Balance Static Sitting - Balance Support: No upper extremity supported;Feet supported Static Sitting - Level of Assistance: 6: Modified independent (Device/Increase time) Static Sitting - Comment/# of Minutes: Pt sat EOB for 10 minutes. Exercise    End of Session PT - End of Session Equipment Utilized  During Treatment: Gait belt Activity Tolerance: Patient limited by fatigue (pt did not wish to transfer today 2* incontinence) Patient left: in bed;with call bell in reach General Behavior During Session: [facility] for tasks performed Cognition: [facility] for tasks performed  Kathryn [name],Kathryn [name] 02/22/2011, 4:09 PM Pager: [phone]

## 2011-02-22 NOTE — Progress Notes (Signed)
CSW spoke with the pt, pt reports she is not feeling well today. Pt states she spoke with Jasmine December at [facility] regarding the possibility of her going to the facility for short term rehab. Pt states she is not for sure what she will do because of the cost. Pt requested that Jasmine December from the facility contact her son to discuss finances. CSW attempted to reach [facility] at the facility to see if a decision has been made, CSW awaiting a return call. CSW will continue to follow the pt and offer support. Golden Pop 02/22/2011 3:18 PM [phone]

## 2011-02-22 NOTE — Progress Notes (Signed)
CSW spoke with Jasmine December with [facility] and she reports she has spoken with the pts son and they will pay the out of pocket expense for the pt to go to rehab. Please note [facility] will most likely have a private room available on Wednesday. Pt would like [facility] because her husband is a resident there.   CSW will continue to follow the pt and offer support.  Ladene Artist N 02/22/2011 4:23 PM [phone]

## 2011-02-22 NOTE — Progress Notes (Signed)
Subjective: Slept well with sleep meds. No CP or SOB. Decubiti are less painful. Eating OK.  Still has gross hematuria.   Objective: Vital signs in last 24 hours: Temp:  [97 F (36.1 C)-98 F (36.7 C)] 97 F (36.1 C) (12/17 0620) Pulse Rate:  [86-93] 93  (12/17 0620) Resp:  [17-18] 18  (12/17 0620) BP: (145-163)/(75-95) 163/84 mmHg (12/17 0620) SpO2:  [95 %-97 %] 95 % (12/17 0620)  Intake/Output from previous day: 12/16 0701 - 12/17 0700 In: 2472.2 [P.O.:360; I.V.:2112.2] Out: -  Intake/Output this shift:    General: alert   less flushed. Oral membranes moist. Neck supple. Lungs clear. Ht tachy IR IR, abd obese soft NT, Extrem 1+ edema, diminished pulses. Alert, mentating well, no tremor   Lab Results   [facility] 02/22/11 0350 02/21/11 0405  WBC 6.0 7.9  RBC 3.07* 3.01*  HGB 9.4* 9.3*  HCT 28.2* 28.0*  MCV 91.9 93.0  MCH 30.6 30.9  RDW 14.6 14.8  PLT 278 261    Basename 02/22/11 0350 02/21/11 0405  NA 137 134*  K 3.7 3.7  CL 107 104  CO2 22 21  GLUCOSE 155* 169*  BUN 22 23  CREATININE 1.30* 1.47*  CALCIUM 8.8 8.6    Studies/Results: No results found.  Scheduled Meds:   . aspirin EC  81 mg Oral Daily  . atorvastatin  20 mg Oral q1800  . calcium-vitamin D  1 tablet Oral BID  . cefTRIAXone (ROCEPHIN)  IV  1 g Intravenous Q24H  . Chlorhexidine Gluconate Cloth  6 each Topical Q0600  . ferrous sulfate  325 mg Oral Q breakfast  . insulin aspart  0-15 Units Subcutaneous TID WC  . insulin glargine  30 Units Subcutaneous Q24H  . losartan  100 mg Oral Daily  . metoprolol tartrate  25 mg Oral BID  . multivitamin-lutein  1 capsule Oral Daily  . multivitamins ther. w/minerals  1 tablet Oral Daily  . mupirocin ointment  1 application Nasal BID  . pantoprazole  40 mg Oral Daily  . polyethylene glycol  17 g Oral Daily  . rivaroxaban  20 mg Oral Daily  . solifenacin  10 mg Oral Daily  . DISCONTD: insulin glargine  25 Units Subcutaneous Q24H   Continuous  Infusions:   . sodium chloride 75 mL/hr at 02/22/11 0600   PRN Meds:acetaminophen, acetaminophen, HYDROcodone-acetaminophen, zolpidem  Ass*UTI (urinary tract infection) on appropriate treatment based culture results with Klebsiella pneumoniae essentially pansensitive except for ampicillin, now diagnosed with MRSA and being treated appropriately per protocol, patient questions continued hematuria. Will get UROLOGY to see. I am concerned that a structural lesion may be present with the continued hematuria  Diabetes-sl better with BS 155 this AM   Hypertension stable  Atrial fibrillation stable rate, on Xarelto arthritis stable Anemia-stable hemoglobin 9.4, slightly improved  Disposition physical therapy and occupational have agreed for skilled nursing facility placement is the best disposition to be performed next week  Decubiti wound care nurse to see  Chronic kidney disease stable if not slightly improved DM Nephropathy: stbalbe Assessment/Plan:     LOS: 5 days   Kathryn [name] [name] 02/22/2011, 7:31 AM

## 2011-02-22 NOTE — Consult Note (Signed)
Urology Consult  Referring physician: South,MD Reason for referral: Hematuria/ chronic cystitis  History of Present Illness: Ms. Faircloth is well known to me from previous outpatient evaluations. She has had a several year history of recurrent/chronic cystitis complicated by gross hematuria. Over the last 2 years she has had a multitude of imaging studies included renal ultrasound as well as CT of her abdomen and pelvis with and without contrast. She is also had several cystoscopies and several urine cytologies all of which have failed to reveal any significant pathology other than chronic bacteruria. She has been on anticoagulation in the past with Coumadin. More recently she has been placed on the Xarelto and is also on baby aspirin. She was noted to have gross hematuria recently at her nursing facility and admitted to the hospital. Her urine culture was positive for Klebsiella pneumonia and was  fairly pansensitive. She has continued to have gross hematuria although per the nursing it has improved and her hemoglobin has been relatively stable the last 72 hours.. She is not shown any obvious signs and symptoms of pyelonephritis/febrile urinary tract infection. She really has had no other complaints. She does have significant urge incontinence is an ongoing problem.  Past Medical History  Diagnosis Date  . Osteoporosis   . DM (diabetes mellitus)   . HTN (hypertension)     unspecified  . Atrial fibrillation   . Macular degeneration   . Osteoarthritis   . Edema   . Anemia   . Hydronephrosis     left  . UTI (lower urinary tract infection)    Past Surgical History  Procedure Date  . Replacement total knee 08/2007    bilateral  . Appendectomy   . Cholecystectomy   . Colonoscopy   . Upper gastrointestinal endoscopy     Medications: I have reviewed the patient's current medications.  Allergies: No Known Allergies  Family History  Problem Relation Age of Onset  . Diabetes Father   .  Atrial fibrillation Father   . Breast cancer Paternal Grandmother   . Heart disease Mother     Social History:  reports that she has never smoked. She has never used smokeless tobacco. She reports that she does not drink alcohol or use illicit drugs.  ROS : Negative for abd pain/ flank pain/ fever/nausea. Positive for fatigue and difficulty walking.  Physical Exam:  Vital signs in last 24 hours: Temp:  [97 F (36.1 C)-98.5 F (36.9 C)] 98.5 F (36.9 C) (12/17 1331) Pulse Rate:  [71-93] 71  (12/17 1331) Resp:  [17-18] 18  (12/17 1331) BP: (153-163)/(84-95) 153/84 mmHg (12/17 1331) SpO2:  [95 %-97 %] 96 % (12/17 1331) Physical Exam:  No acute distress/ non-toxic  Resp: nl effort Cards: RRR Abd: Soft/ NT/ No CVAT  Laboratory Data:  Results for orders placed during the hospital encounter of 02/17/11 (from the past 72 hour(s))  GLUCOSE, CAPILLARY     Status: Abnormal   Collection Time   02/19/11  9:46 PM      Component Value Range Comment   Glucose-Capillary 227 (*) 70 - 99 (mg/dL)    Comment 1 Notify RN     CBC     Status: Abnormal   Collection Time   02/20/11  3:43 AM      Component Value Range Comment   WBC 7.7  4.0 - 10.5 (K/uL)    RBC 3.07 (*) 3.87 - 5.11 (MIL/uL)    Hemoglobin 9.3 (*) 12.0 - 15.0 (g/dL)  HCT 28.3 (*) 36.0 - 46.0 (%)    MCV 92.2  78.0 - 100.0 (fL)    MCH 30.3  26.0 - 34.0 (pg)    MCHC 32.9  30.0 - 36.0 (g/dL)    RDW 78.4  69.6 - 29.5 (%)    Platelets 244  150 - 400 (K/uL)   COMPREHENSIVE METABOLIC PANEL     Status: Abnormal   Collection Time   02/20/11  3:43 AM      Component Value Range Comment   Sodium 130 (*) 135 - 145 (mEq/L)    Potassium 3.7  3.5 - 5.1 (mEq/L)    Chloride 102  96 - 112 (mEq/L)    CO2 20  19 - 32 (mEq/L)    Glucose, Bld 186 (*) 70 - 99 (mg/dL)    BUN 25 (*) 6 - 23 (mg/dL)    Creatinine, Ser 2.84 (*) 0.50 - 1.10 (mg/dL)    Calcium 8.5  8.4 - 10.5 (mg/dL)    Total Protein 5.9 (*) 6.0 - 8.3 (g/dL)    Albumin 2.0 (*) 3.5  - 5.2 (g/dL)    AST 28  0 - 37 (U/L)    ALT 24  0 - 35 (U/L)    Alkaline Phosphatase 129 (*) 39 - 117 (U/L)    Total Bilirubin 0.2 (*) 0.3 - 1.2 (mg/dL)    GFR calc non Af Amer 30 (*) >90 (mL/min)    GFR calc Af Amer 34 (*) >90 (mL/min)   GLUCOSE, CAPILLARY     Status: Abnormal   Collection Time   02/20/11  7:54 AM      Component Value Range Comment   Glucose-Capillary 191 (*) 70 - 99 (mg/dL)    Comment 1 Notify RN     GLUCOSE, CAPILLARY     Status: Abnormal   Collection Time   02/20/11 12:08 PM      Component Value Range Comment   Glucose-Capillary 271 (*) 70 - 99 (mg/dL)    Comment 1 Notify RN     GLUCOSE, CAPILLARY     Status: Abnormal   Collection Time   02/20/11  4:48 PM      Component Value Range Comment   Glucose-Capillary 178 (*) 70 - 99 (mg/dL)    Comment 1 Notify RN     GLUCOSE, CAPILLARY     Status: Abnormal   Collection Time   02/20/11 10:01 PM      Component Value Range Comment   Glucose-Capillary 194 (*) 70 - 99 (mg/dL)    Comment 1 Notify RN     BASIC METABOLIC PANEL     Status: Abnormal   Collection Time   02/21/11  4:05 AM      Component Value Range Comment   Sodium 134 (*) 135 - 145 (mEq/L)    Potassium 3.7  3.5 - 5.1 (mEq/L)    Chloride 104  96 - 112 (mEq/L)    CO2 21  19 - 32 (mEq/L)    Glucose, Bld 169 (*) 70 - 99 (mg/dL)    BUN 23  6 - 23 (mg/dL)    Creatinine, Ser 1.32 (*) 0.50 - 1.10 (mg/dL)    Calcium 8.6  8.4 - 10.5 (mg/dL)    GFR calc non Af Amer 34 (*) >90 (mL/min)    GFR calc Af Amer 39 (*) >90 (mL/min)   CBC     Status: Abnormal   Collection Time   02/21/11  4:05 AM      Component  Value Range Comment   WBC 7.9  4.0 - 10.5 (K/uL)    RBC 3.01 (*) 3.87 - 5.11 (MIL/uL)    Hemoglobin 9.3 (*) 12.0 - 15.0 (g/dL)    HCT 40.9 (*) 81.1 - 46.0 (%)    MCV 93.0  78.0 - 100.0 (fL)    MCH 30.9  26.0 - 34.0 (pg)    MCHC 33.2  30.0 - 36.0 (g/dL)    RDW 91.4  78.2 - 95.6 (%)    Platelets 261  150 - 400 (K/uL)   GLUCOSE, CAPILLARY     Status:  Abnormal   Collection Time   02/21/11  7:32 AM      Component Value Range Comment   Glucose-Capillary 158 (*) 70 - 99 (mg/dL)    Comment 1 Notify RN     GLUCOSE, CAPILLARY     Status: Abnormal   Collection Time   02/21/11 11:35 AM      Component Value Range Comment   Glucose-Capillary 260 (*) 70 - 99 (mg/dL)   GLUCOSE, CAPILLARY     Status: Abnormal   Collection Time   02/21/11  4:55 PM      Component Value Range Comment   Glucose-Capillary 182 (*) 70 - 99 (mg/dL)   GLUCOSE, CAPILLARY     Status: Abnormal   Collection Time   02/21/11 10:14 PM      Component Value Range Comment   Glucose-Capillary 173 (*) 70 - 99 (mg/dL)    Comment 1 Notify RN     BASIC METABOLIC PANEL     Status: Abnormal   Collection Time   02/22/11  3:50 AM      Component Value Range Comment   Sodium 137  135 - 145 (mEq/L)    Potassium 3.7  3.5 - 5.1 (mEq/L)    Chloride 107  96 - 112 (mEq/L)    CO2 22  19 - 32 (mEq/L)    Glucose, Bld 155 (*) 70 - 99 (mg/dL)    BUN 22  6 - 23 (mg/dL)    Creatinine, Ser 2.13 (*) 0.50 - 1.10 (mg/dL)    Calcium 8.8  8.4 - 10.5 (mg/dL)    GFR calc non Af Amer 39 (*) >90 (mL/min)    GFR calc Af Amer 45 (*) >90 (mL/min)   CBC     Status: Abnormal   Collection Time   02/22/11  3:50 AM      Component Value Range Comment   WBC 6.0  4.0 - 10.5 (K/uL)    RBC 3.07 (*) 3.87 - 5.11 (MIL/uL)    Hemoglobin 9.4 (*) 12.0 - 15.0 (g/dL)    HCT 08.6 (*) 57.8 - 46.0 (%)    MCV 91.9  78.0 - 100.0 (fL)    MCH 30.6  26.0 - 34.0 (pg)    MCHC 33.3  30.0 - 36.0 (g/dL)    RDW 46.9  62.9 - 52.8 (%)    Platelets 278  150 - 400 (K/uL)   GLUCOSE, CAPILLARY     Status: Abnormal   Collection Time   02/22/11  8:24 AM      Component Value Range Comment   Glucose-Capillary 148 (*) 70 - 99 (mg/dL)   GLUCOSE, CAPILLARY     Status: Abnormal   Collection Time   02/22/11  1:18 PM      Component Value Range Comment   Glucose-Capillary 191 (*) 70 - 99 (mg/dL)    Comment 1 Documented in Chart  Comment 2 Notify RN     GLUCOSE, CAPILLARY     Status: Abnormal   Collection Time   02/22/11  5:29 PM      Component Value Range Comment   Glucose-Capillary 194 (*) 70 - 99 (mg/dL)    Comment 1 Notify RN      Recent Results (from the past 240 hour(s))  URINE CULTURE     Status: Normal   Collection Time   02/17/11  4:16 PM      Component Value Range Status Comment   Specimen Description URINE, CATHETERIZED   Final    Special Requests NONE   Final    Setup Time [id number]   Final    Colony Count >=100,000 COLONIES/ML   Final    Culture KLEBSIELLA PNEUMONIAE   Final    Report Status 02/19/2011 FINAL   Final    Organism ID, Bacteria KLEBSIELLA PNEUMONIAE   Final   MRSA PCR SCREENING     Status: Abnormal   Collection Time   02/19/11  3:53 PM      Component Value Range Status Comment   MRSA by PCR POSITIVE (*) NEGATIVE  Final    Creatinine:  Basename 02/22/11 0350 02/21/11 0405 02/20/11 0343 02/19/11 0345 02/18/11 0602 02/17/11 1602  CREATININE 1.30* 1.47* 1.63* 1.88* 1.86* 1.95*   Baseline Creatinine:   Impression/Assessment:  Gross hematuria/hemorrhagic cystitis.  Plan:  Ms. Frie has had a frustrating problem with chronic bacteria/chronic cystitis complicated by ongoing issues with gross hematuria. Again a multitude of upper tract imaging studies, cystoscopy and cytology have failed to reveal any significant structural abnormalities malignancies or other contributing factors. Her chronic anticoagulation his certainly complicated her situation. She has been on daily prophylactic antibiotics in the past but unfortunately that has not resolved her problems. It is been approximately a year since she's had any additional evaluation and certainly we can plan on outpatient repeat cystoscopy. I don't think there is any good indication for repeat CT imaging at this time. Decisions will need to be made with regard to her need for continued anticoagulation in the face chronic problems with  gross hematuria. Continued use of antibiotics and supportive care all that I would recommend at this point. In the past while she is been actively bleeding cystoscopy has revealed diffuse mild mucosal oozing and therefore a surgical approach to fulgurate anything is unlikely to be of any long-term benefit to her. I suspect she may have some old clot in her bladder rather than significant ongoing bleeding given her stable hemoglobin over the last 3 days. We will continue to follow her during her hospitalization.  Lavonya Hoerner S 02/22/2011, 6:16 PM

## 2011-02-22 NOTE — Plan of Care (Signed)
Problem: Phase III Progression Outcomes Goal: Voiding independently Outcome: Not Progressing Pt. incontinent

## 2011-02-22 NOTE — Progress Notes (Signed)
Inpatient Diabetes Program Recommendations  AACE/ADA: New Consensus Statement on Inpatient Glycemic Control (2009)  Target Ranges:  Prepandial:   less than 140 mg/dL      Peak postprandial:   less than 180 mg/dL (1-2 hours)      Critically ill patients:  140 - 180 mg/dL   Reason for Visit: CBG's elevated at lunch. CBG's today are 148 and 191 mg/dL.  Inpatient Diabetes Program Recommendations Insulin - Basal: x Insulin - Meal Coverage: Consider adding Novolog meal coverage 3 units tid with meals (hold if patient eats less than 50%). HgbA1C: x

## 2011-02-23 LAB — BASIC METABOLIC PANEL
BUN: 23 mg/dL (ref 6–23)
Calcium: 8.7 mg/dL (ref 8.4–10.5)
Creatinine, Ser: 1.3 mg/dL — ABNORMAL HIGH (ref 0.50–1.10)
GFR calc non Af Amer: 39 mL/min — ABNORMAL LOW (ref >90)
Glucose, Bld: 144 mg/dL — ABNORMAL HIGH (ref 70–99)
Sodium: 137 mEq/L (ref 135–145)

## 2011-02-23 LAB — GLUCOSE, CAPILLARY
Glucose-Capillary: 197 mg/dL — ABNORMAL HIGH (ref 70–99)
Glucose-Capillary: 170 mg/dL — ABNORMAL HIGH (ref 70–99)
Glucose-Capillary: 151 mg/dL — ABNORMAL HIGH (ref 70–99)

## 2011-02-23 LAB — CBC
MCH: 30.8 pg (ref 26.0–34.0)
MCHC: 33.4 g/dL (ref 30.0–36.0)
MCV: 92.1 fL (ref 78.0–100.0)
Platelets: 289 10*3/uL (ref 150–400)
RBC: 3.15 MIL/uL — ABNORMAL LOW (ref 3.87–5.11)
RDW: 14.9 % (ref 11.5–15.5)

## 2011-02-23 MED ORDER — RIVAROXABAN 15 MG PO TABS
15.0000 mg | ORAL_TABLET | Freq: Every day | ORAL | Status: DC
  Administered 2011-02-24: 15 mg via ORAL
  Filled 2011-02-23 (×3): qty 1

## 2011-02-23 NOTE — Progress Notes (Signed)
Subjective: Had a sleepless night. Still seeing some gross hematuria. Decubiti are less painful. Eating OK   Objective: Vital signs in last 24 hours: Temp:  [98.2 F (36.8 C)-98.5 F (36.9 C)] 98.2 F (36.8 C) (12/18 0500) Pulse Rate:  [71-93] 93  (12/18 0500) Resp:  [18-20] 20  (12/18 0500) BP: (153-173)/(82-84) 173/82 mmHg (12/18 0500) SpO2:  [96 %-97 %] 97 % (12/18 0500)  Intake/Output from previous day: 12/17 0701 - 12/18 0700 In: 1572.7 [P.O.:360; I.V.:1212.7] Out: -  Intake/Output this shift:    General: alertnon toxic Oral membranes moist. Neck supple. Lungs clear. Ht tachy IR IR, abd obese soft NT, Extrem 1+ edema, diminished pulses. Alert, mentating well, no tremor   Lab Results   Basename 02/23/11 0321 02/22/11 0350  WBC 7.4 6.0  RBC 3.15* 3.07*  HGB 9.7* 9.4*  HCT 29.0* 28.2*  MCV 92.1 91.9  MCH 30.8 30.6  RDW 14.9 14.6  PLT 289 278    Basename 02/23/11 0321 02/22/11 0350  NA 137 137  K 3.5 3.7  CL 107 107  CO2 23 22  GLUCOSE 144* 155*  BUN 23 22  CREATININE 1.30* 1.30*  CALCIUM 8.7 8.8    Studies/Results: No results found.  Scheduled Meds:   . aspirin EC  81 mg Oral Daily  . atorvastatin  20 mg Oral q1800  . calcium-vitamin D  1 tablet Oral BID  . cefTRIAXone (ROCEPHIN)  IV  1 g Intravenous Q24H  . Chlorhexidine Gluconate Cloth  6 each Topical Q0600  . ferrous sulfate  325 mg Oral Q breakfast  . insulin aspart  0-15 Units Subcutaneous TID WC  . insulin glargine  30 Units Subcutaneous Q24H  . losartan  100 mg Oral Daily  . metoprolol tartrate  25 mg Oral BID  . multivitamin-lutein  1 capsule Oral Daily  . multivitamins ther. w/minerals  1 tablet Oral Daily  . mupirocin ointment  1 application Nasal BID  . pantoprazole  40 mg Oral Daily  . polyethylene glycol  17 g Oral Daily  . rivaroxaban  20 mg Oral Daily  . solifenacin  10 mg Oral Daily   Continuous Infusions:   . sodium chloride 75 mL/hr at 02/23/11 0200   PRN  Meds:acetaminophen, acetaminophen, HYDROcodone-acetaminophen, zolpidem  Assessment/Plan:  UTI (urinary tract infection) on appropriate treatment based culture results with Klebsiella pneumoniae essentially pansensitive except for ampicillin, now diagnosed with MRSA and being treated appropriately per protocol, patient questions continued hematuria. Urology help appreciated. Apparently has had the requisite CT/Cysto fairly recently. Likely to continue with some hematuria due to the long term need for anticoag (Xarelto) Diabetes-sl better with BS 129 this AM  Hypertension stable  Atrial fibrillation stable rate, on Xarelto  arthritis stable  Anemia-stable hemoglobin 9.7, slightly improved  Disposition physical therapy and occupational have agreed for skilled nursing facility placement is the best disposition , possible tomorrow Decubiti wound care nurse on board Chronic kidney disease   slightly improved   MRSA: More of a colonization rather than a pathologic process. Should not delay SNF.    LOS: 6 days   Kathryn [name] 02/23/2011, 7:29 AM

## 2011-02-23 NOTE — Plan of Care (Signed)
Problem: Phase III Progression Outcomes Goal: Voiding independently Outcome: Completed/Met Date Met:  02/23/11 Patient's urine remains bloody

## 2011-02-23 NOTE — Plan of Care (Signed)
Problem: Discharge Progression Outcomes Goal: Discharge plan in place and appropriate Outcome: Completed/Met Date Met:  02/23/11 D/c in am 12/19 to [facility]

## 2011-02-23 NOTE — Plan of Care (Signed)
Problem: Phase II Progression Outcomes Goal: Discharge plan established Outcome: Completed/Met Date Met:  02/23/11 Patient will be discharged to [facility] for rehab.  This is where her spouse resides.

## 2011-02-23 NOTE — Plan of Care (Signed)
Problem: Phase II Progression Outcomes Goal: Progress activity as tolerated unless otherwise ordered Outcome: Not Progressing Patient refused transfer with PT.

## 2011-02-24 LAB — CBC
HCT: 28 % — ABNORMAL LOW (ref 36.0–46.0)
MCH: 31 pg (ref 26.0–34.0)
MCV: 92.4 fL (ref 78.0–100.0)
RBC: 3.03 MIL/uL — ABNORMAL LOW (ref 3.87–5.11)
WBC: 7.4 10*3/uL (ref 4.0–10.5)

## 2011-02-24 LAB — COMPREHENSIVE METABOLIC PANEL
BUN: 18 mg/dL (ref 6–23)
CO2: 25 mEq/L (ref 19–32)
Calcium: 8.7 mg/dL (ref 8.4–10.5)
Chloride: 106 mEq/L (ref 96–112)
Creatinine, Ser: 1.25 mg/dL — ABNORMAL HIGH (ref 0.50–1.10)
GFR calc Af Amer: 48 mL/min — ABNORMAL LOW (ref >90)
GFR calc non Af Amer: 41 mL/min — ABNORMAL LOW (ref >90)
Total Bilirubin: 0.3 mg/dL (ref 0.3–1.2)

## 2011-02-24 MED ORDER — INSULIN GLARGINE 100 UNIT/ML ~~LOC~~ SOLN: 30.0000 [IU] | SUBCUTANEOUS | Status: DC

## 2011-02-24 MED ORDER — INSULIN ASPART 100 UNIT/ML ~~LOC~~ SOLN: 0.0000 [IU] | Freq: Three times a day (TID) | SUBCUTANEOUS | Status: DC

## 2011-02-24 MED ORDER — HYDROCODONE-ACETAMINOPHEN 5-325 MG PO TABS: 1.0000 | ORAL_TABLET | ORAL | Status: AC | PRN

## 2011-02-24 MED ORDER — ACETAMINOPHEN 325 MG PO TABS: 650.0000 mg | ORAL_TABLET | Freq: Four times a day (QID) | ORAL | Status: AC | PRN

## 2011-02-24 MED ORDER — RIVAROXABAN 15 MG PO TABS: 15.0000 mg | ORAL_TABLET | Freq: Every day | ORAL | Status: DC

## 2011-02-24 MED ORDER — DEXTROSE 5 % IV SOLN: 1.0000 g | INTRAVENOUS | Status: DC

## 2011-02-24 NOTE — Progress Notes (Signed)
CSW received a phone call from Dr. Evlyn Kanner that pt was medically cleared to discharge to SNF. CSW faxed discharge clinicals to Jasmine December at [facility] and confirmed they were received. CSW has spoken with charge nurse Rene Kocher, and pts nurse Selena Batten. CSW has arranged transportation via [facility] and they will pick the pt up at 3:00 pm, and Jasmine December has verified it is okay to send the pt to the facility. CSW has complied all discharge clinicals needed to transport with the pt. Pt has been notified that [facility] was called. CSW signing off.  Patrice Paradise, LCSWA 02/24/2011 2:49 PM [phone]

## 2011-02-24 NOTE — Discharge Summary (Addendum)
DISCHARGE SUMMARY  Kathryn [name]  MR#: [id number]  DOB:07-Feb-1934  Date of Admission: 02/17/2011 Date of Discharge: 02/24/2011  Attending Physician:Samyra Limb [name]  Patient's ZHY:QMVHQ,IONGEXB [name], MD  Consults:Treatment Team:  Minda Meo, MD Valetta Fuller, MD  Discharge Diagnoses: Principal Problem:  *UTI (urinary tract infection),Klebsiella Patient Active Problem List  Diagnoses  . DM type II, insulin requiring under good control, ICD-9 250.40 Diabetic nephropathy, stage III C KD, ICD-9 583.81   . HYPERCHOLESTEROLEMIA  IIA, ICD 272.4   . MACULAR DEGENERATION  . HYPERTENSION, under fair control, ICD-9 401.1   . ATRIAL FIBRILLATION, chronic on XARELTO, ICD-9 427.31   . OSTEOARTHRITIS  . OSTEOPOROSIS, ICD-9 733.00   . EDEMA  . Anemia, on iron   .  chronic hematuria, extensively worked up. Urology consult this visit  Chronic deconditioning, stable  \Sacral decubiti, seen by wound care nurse during this admission  Hypokalemia: This is minor and needs to be rechecked  Anemia: She is on supplemental iron.  Bladder instability and incontinence: She is on Vesicare chronically Protein calorie malnutrition: Albumin is low at 2.3       Discharge Medications: Current Discharge Medication List    START taking these medications   Details  acetaminophen (TYLENOL) 325 MG tablet Take 2 tablets (650 mg total) by mouth every 6 (six) hours as needed (or Fever >/= 101). Qty: 30 tablet, Refills: 30    dextrose 5 % SOLN 50 mL with cefTRIAXone 1 G SOLR 1 g Inject 1 g into the vein daily. Qty: 1 g, Refills: 0    HYDROcodone-acetaminophen (NORCO) 5-325 MG per tablet Take 1-2 tablets by mouth every 4 (four) hours as needed. Qty: 30 tablet, Refills: 0    insulin aspart (NOVOLOG) 100 UNIT/ML injection Inject 0-15 Units into the skin 3 (three) times daily with meals. Qty: 1 vial, Refills: 0    insulin glargine (LANTUS) 100 UNIT/ML injection Inject 30 Units into the skin  daily. Qty: 10 mL, Refills: 0      CONTINUE these medications which have CHANGED   Details  Rivaroxaban (XARELTO) 15 MG TABS tablet Take 1 tablet (15 mg total) by mouth daily. Qty: 30 tablet, Refills: 0      CONTINUE these medications which have NOT CHANGED   Details  atorvastatin (LIPITOR) 20 MG tablet Take 20 mg by mouth daily.      B Complex-C-Folic Acid (NEPHRO-VITE PO) Take 1 capsule by mouth daily.      Calcium Carbonate-Vitamin D 600-400 MG-UNIT per tablet Take 1 tablet by mouth 2 (two) times daily.      ferrous sulfate 325 (65 FE) MG tablet Take 325 mg by mouth daily with breakfast.      losartan (COZAAR) 100 MG tablet Take 100 mg by mouth daily.      metoprolol tartrate (LOPRESSOR) 25 MG tablet Take 25 mg by mouth 2 (two) times daily.     Multiple Vitamins-Minerals (MULTIVITAMINS THER. W/MINERALS) TABS Take 1 tablet by mouth daily.      multivitamin-lutein (OCUVITE-LUTEIN) CAPS Take 1 capsule by mouth daily.      pantoprazole (PROTONIX) 40 MG tablet Take 40 mg by mouth daily.      polyethylene glycol (MIRALAX / GLYCOLAX) packet Take 17 g by mouth daily.      sennosides-docusate sodium (SENOKOT-S) 8.6-50 MG tablet Take 2 tablets by mouth at bedtime.      solifenacin (VESICARE) 5 MG tablet Take 10 mg by mouth daily.      zolpidem (AMBIEN)  5 MG tablet Take 5 mg by mouth at bedtime as needed. For sleep.      STOP taking these medications     aspirin 81 MG tablet      insulin NPH-insulin regular (HUMULIN 70/30) (70-30) 100 UNIT/ML injection         Hospital Procedures: Dg Chest 2 View  02/17/2011  *RADIOLOGY REPORT*  Clinical Data: Fever.  Rule out infiltrate  CHEST - 2 VIEW  Comparison: 10/10/2010  Findings: Cardiac enlargement without heart failure or edema.  No pleural effusion.  Negative for pneumonia.  IMPRESSION: No acute abnormality.  Original Report Authenticated By: Camelia Phenes, M.D.   Dg Lumbar Spine Complete  01/27/2011  *RADIOLOGY REPORT*   Clinical Data: Fall.  Back pain.  LUMBAR SPINE - COMPLETE 4+ VIEW  Comparison: CT abdomen and pelvis 02/13/2010.  Findings: No fracture or subluxation is identified.  Severe multilevel degenerative change is present.  There is convex left scoliosis.  IMPRESSION: No acute finding.  Original Report Authenticated By: Bernadene Bell. Maricela Curet, M.D.   Dg Sacrum/coccyx  01/27/2011  *RADIOLOGY REPORT*  Clinical Data: Larey Seat.  Sacrococcygeal injury.  SACRUM AND COCCYX - 2+ VIEW 01/27/2011:  Comparison: None.  Findings: No acute angulation involving the coccyx.  No visible sacral fractures.  Severe osteopenia.  Symphysis pubis intact with mild degenerative changes.  Sacroiliac joints intact with degenerative changes.  IMPRESSION: No acute osseous abnormality.  Original Report Authenticated By: Arnell Sieving, M.D.   Dg Hip Complete Left  01/27/2011  *RADIOLOGY REPORT*  Clinical Data: Larey Seat and injured left hip.  LEFT HIP - COMPLETE 2+ VIEW 01/27/2011:  Comparison: None.  Findings: No evidence of acute fracture or dislocation.  Moderate axial joint space narrowing.  Bone mineral density relatively well preserved.  Included AP pelvis demonstrates an intact contralateral right hip with mild joint space narrowing.  Sacroiliac joints and symphysis pubis intact with degenerative changes.  Severe degenerative changes involve the visualized lower lumbar spine.  Innumerable injection granulomata in the buttocks.  IMPRESSION: No acute osseous abnormality.  Moderate osteoarthritis involving the left hip.  Original Report Authenticated By: Arnell Sieving, M.D.   Ct Head Wo Contrast  01/27/2011  *RADIOLOGY REPORT*  Clinical Data:  Fall, neck pain  CT HEAD WITHOUT CONTRAST CT CERVICAL SPINE WITHOUT CONTRAST  Technique:  Multidetector CT imaging of the head and cervical spine was performed following the standard protocol without intravenous contrast.  Multiplanar CT image reconstructions of the cervical spine were also  generated.  Comparison:  [facility] CT head dated 10/10/2010  CT HEAD  Findings: No evidence of parenchymal hemorrhage or extra-axial fluid collection. No mass lesion, mass effect, or midline shift.  No CT evidence of acute infarction.  Subcortical white matter and periventricular small vessel ischemic changes.  Global cortical atrophy.  No ventriculomegaly.  The visualized paranasal sinuses are essentially clear. The mastoid air cells are unopacified.  No evidence of calvarial fracture.  IMPRESSION: No evidence of acute intracranial abnormality.  Atrophy with small vessel ischemic changes.  CT CERVICAL SPINE  Findings: Exaggerated cervical lordosis.  No evidence of fracture or dislocation.  Vertebral body heights are maintained.  Dens appears intact.  No prevertebral soft tissue swelling.  Moderate multilevel degenerative changes.  Visualized thyroid is unremarkable.  Visualized lung apices are clear.  IMPRESSION: No evidence of traumatic injury to the cervical spine.  Moderate multilevel degenerative changes.  Original Report Authenticated By: Charline Bills, M.D.   Ct Cervical Spine Wo Contrast  01/27/2011  *RADIOLOGY REPORT*  Clinical Data:  Fall, neck pain  CT HEAD WITHOUT CONTRAST CT CERVICAL SPINE WITHOUT CONTRAST  Technique:  Multidetector CT imaging of the head and cervical spine was performed following the standard protocol without intravenous contrast.  Multiplanar CT image reconstructions of the cervical spine were also generated.  Comparison:  [facility] CT head dated 10/10/2010  CT HEAD  Findings: No evidence of parenchymal hemorrhage or extra-axial fluid collection. No mass lesion, mass effect, or midline shift.  No CT evidence of acute infarction.  Subcortical white matter and periventricular small vessel ischemic changes.  Global cortical atrophy.  No ventriculomegaly.  The visualized paranasal sinuses are essentially clear. The mastoid air cells are unopacified.  No evidence of calvarial  fracture.  IMPRESSION: No evidence of acute intracranial abnormality.  Atrophy with small vessel ischemic changes.  CT CERVICAL SPINE  Findings: Exaggerated cervical lordosis.  No evidence of fracture or dislocation.  Vertebral body heights are maintained.  Dens appears intact.  No prevertebral soft tissue swelling.  Moderate multilevel degenerative changes.  Visualized thyroid is unremarkable.  Visualized lung apices are clear.  IMPRESSION: No evidence of traumatic injury to the cervical spine.  Moderate multilevel degenerative changes.  Original Report Authenticated By: Charline Bills, M.D.    History of Present Illness: Kathryn [name] is a 77 year old-year-old white female long-standing patient my practice. She presented to my partner with symptoms of a UTI with gross hematuria.  Hospital Course: Kathryn [name] is done moderately well. She spent her first hospital day in the emergency room his been up on the regular floor since then. She still has some hematuria as noted. She said extensive workup in the past and was seen this admission by Dr. Isabel Caprice who did not feel additional workup is needed. To help the hematuria I have stopped her aspirin and we have adjusted her xarelto dose downward based on her renal function. This is likely a better option for her than Coumadin due to this bleeding problem. The patient has chronic atrial fib requiring anticoagulation. She's had no fever and several days. She's completed 6/7 days of antibiotics with ceftriaxone. Her blood sugars have come into much better control. She's had no hypoglycemia. Her blood pressures generally done well but was up a bit today. Her atrial fib rate has been well-controlled. Her mobility is been a chronic problem and it worsened recently. She is in agreement that it is in her best interest to go to skilled nursing facility. Her husband actually had a spinal cord infarct a couple of years ago it is chronically at [facility] where she is going.  This morning she is feeling well. She slept well. She has no lower chest pain. Her bowels work yesterday. She is eating well. She has no pain at the present time. She is in good spirits. I am hopeful that was in physical and occupational therapy that her mobility can be improved. She is at high risk for falls. She should be evaluated by the wound care team regarding her decubiti. Of note, she did have an MRSA swab that was positive. This would appear to be a colonization the did not cause any clinical issues. She finished the decontamination procedure with Bactroban.  DIET: No concentrated sweets BLOOD sugar monitoring: Before meals and at bedtime with sliding scale of rapid acting insulin CODE STATUS: She is been a full code while here Day of Discharge Exam BP 184/86  Pulse 93  Temp(Src) 97.9 F (36.6 C) (Oral)  Resp 22  Ht 5\' 10"  (1.778 m)  Wt 107.7 kg (237 lb 7 oz)  BMI 34.07 kg/m2  SpO2 98%  Physical Exam: General appearance: alert. Lying quietly in no distress. Face is symmetric. Sclera anicteric. No exophthalmos present. Neck is supple: oropharynx moist without erythema Resp: clear to auscultation bilaterally Cardio: irregularly irregular rhythm, with a systolic murmur. GI: Obese, soft, non-tender; bowel sounds normal; no masses,  no organomegaly Extremities: 2+ chronic edema with relatively intact pulses Neuro: Patient awake alert and mentating well with clear speech. She is in good spirits. She has no resting tremor. Skin exam: Sacral decubiti are present  Discharge Labs:  [facility] [place] 02/24/11 0320 02/23/11 0321  NA 139 137  K 3.4* 3.5  CL 106 107  CO2 25 23  GLUCOSE 105* 144*  BUN 18 23  CREATININE 1.25* 1.30*  CALCIUM 8.7 8.7  MG -- --  PHOS -- --    Basename 02/24/11 0320  AST 27  ALT 31  ALKPHOS 124*  BILITOT 0.3  PROT 6.0  ALBUMIN 2.1*    Basename 02/24/11 0320 02/23/11 0321  WBC 7.4 7.4  NEUTROABS -- --  HGB 9.4* 9.7*  HCT 28.0* 29.0*  MCV 92.4 92.1    PLT 309 289   Lab Results  Component Value Date   INR 1.10* 02/05/2011   INR 1.14 11/15/2010   INR 1.88* 10/17/2010   PROTIME 13.2 02/05/2011   PROTIME 19.7 08/09/2008    Results for Kathryn, [name] (MRN [id number]) as of 02/24/2011 09:18  Ref. Range 02/24/2011 03:20  Alkaline Phosphatase Latest Range: 39-117 U/L 124 (H)  Albumin Latest Range: 55.8-66.1 % 2.1 (L)  AST Latest Range: 0-37 U/L 27  ALT Latest Range: 0-35 U/L 31  Total Protein Latest Range: 6.0-8.3 g/dL 6.0  Total Bilirubin Latest Range: 0.3-1.2 mg/dL 0.3    Discharge instructions: Discharge Orders    Future Appointments: Provider: Department: Dept Phone: Center:   02/26/2011 3:45 PM Stephanie Acre [facility] [phone] None   02/26/2011 4:15 PM [facility] Inj Nurse [facility] [phone] None   03/16/2011 3:00 PM Wendall Stade, MD [facility] [phone] [id number]   03/19/2011 3:45 PM Leighton Ruff Womack [facility] [id number] None   03/19/2011 4:15 PM [facility] Flush Nurse [facility] [id number] None   04/09/2011 3:00 PM Leighton Ruff Womack [facility] [id number] None   04/09/2011 3:30 PM Eli Hose, MD [facility] [id number] None      Disposition: To skilled nursing facility  Follow-up Appts: Follow-up with Dr. Evlyn Kanner at [facility] when discharged from [facility].  Call for appointment.  Condition on Discharge: Improved  Tests Needing Follow-up: None  Signed: Giankarlo Leamer [name] 02/24/2011, 9:29 AM

## 2011-02-26 ENCOUNTER — Ambulatory Visit (HOSPITAL_BASED_OUTPATIENT_CLINIC_OR_DEPARTMENT_OTHER): Payer: Medicare Other

## 2011-02-26 ENCOUNTER — Other Ambulatory Visit (HOSPITAL_BASED_OUTPATIENT_CLINIC_OR_DEPARTMENT_OTHER): Payer: Medicare Other | Admitting: Lab

## 2011-02-26 ENCOUNTER — Other Ambulatory Visit: Payer: Self-pay | Admitting: Oncology

## 2011-02-26 VITALS — BP 141/85 | HR 87 | Temp 100.1°F

## 2011-02-26 DIAGNOSIS — D649 Anemia, unspecified: Secondary | ICD-10-CM

## 2011-02-26 LAB — CBC WITH DIFFERENTIAL/PLATELET
Basophils Absolute: 0.1 10*3/uL (ref 0.0–0.1)
Eosinophils Absolute: 0.1 10*3/uL (ref 0.0–0.5)
HCT: 27.7 % — ABNORMAL LOW (ref 34.8–46.6)
HGB: 8.8 g/dL — ABNORMAL LOW (ref 11.6–15.9)
LYMPH%: 9 % — ABNORMAL LOW (ref 14.0–49.7)
MONO#: 0.7 10*3/uL (ref 0.1–0.9)
NEUT#: 6 10*3/uL (ref 1.5–6.5)
NEUT%: 79.1 % — ABNORMAL HIGH (ref 38.4–76.8)
Platelets: 291 10*3/uL (ref 145–400)
RBC: 2.93 10*6/uL — ABNORMAL LOW (ref 3.70–5.45)
WBC: 7.6 10*3/uL (ref 3.9–10.3)
nRBC: 0 % (ref 0–0)

## 2011-02-26 MED ORDER — DARBEPOETIN ALFA-POLYSORBATE 500 MCG/ML IJ SOLN
300.0000 ug | Freq: Once | INTRAMUSCULAR | Status: AC
  Administered 2011-02-26: 300 ug via SUBCUTANEOUS
  Filled 2011-02-26: qty 1

## 2011-03-16 ENCOUNTER — Encounter: Payer: Self-pay | Admitting: Cardiovascular Disease

## 2011-03-16 ENCOUNTER — Ambulatory Visit (INDEPENDENT_AMBULATORY_CARE_PROVIDER_SITE_OTHER): Payer: Medicare Other | Admitting: Cardiovascular Disease

## 2011-03-16 DIAGNOSIS — I4891 Unspecified atrial fibrillation: Secondary | ICD-10-CM

## 2011-03-16 DIAGNOSIS — E7800 Pure hypercholesterolemia, unspecified: Secondary | ICD-10-CM

## 2011-03-16 DIAGNOSIS — R609 Edema, unspecified: Secondary | ICD-10-CM

## 2011-03-16 DIAGNOSIS — I10 Essential (primary) hypertension: Secondary | ICD-10-CM

## 2011-03-16 NOTE — Assessment & Plan Note (Signed)
Cholesterol is at goal.  Continue current dose of statin and diet Rx.  No myalgias or side effects.  F/U  LFT's in 6 months. No results found for this basename: LDLCALC             

## 2011-03-16 NOTE — Assessment & Plan Note (Signed)
Well controlled.  Continue current medications and low sodium Dash type diet.    

## 2011-03-16 NOTE — Assessment & Plan Note (Signed)
Good rate control and anticoagulation.  Agree with decrease in Xarelto due to GFR and hematuria

## 2011-03-16 NOTE — Progress Notes (Signed)
Kathryn [name] is seen today for F/U of H'TN, edema and chronic afib on anticoagulation. . She has now had both knees replaced. She has mild chronic dyspnea and LE edema from her obesity and this is actually slightly improved.  Unfortunatley her husband had a spianl cord bleed from ? ?AV malformation and is paralyzed from the waste down. She obviously is devastated by this. They are both at [facility] now.  Kathryn [name] fell and has had hematuria.  Her xarelto was decreased to 15mg  which is appr given Cr 1.3 and hematuria.  Has F/U appt with Dr Isabel Caprice and Dr Chilton Si and [place] have been following her at [facility]   ROS: Denies fever, malais, weight loss, blurry vision, decreased visual acuity, cough, sputum, SOB, hemoptysis, pleuritic pain, palpitaitons, heartburn, abdominal pain, melena, lower extremity edema, claudication, or rash.  All other systems reviewed and negative  General: Affect appropriate Chronically ill has lost about 60 lbs HEENT: normal Neck supple with no adenopathy JVP normal no bruits no thyromegaly Lungs clear with no wheezing and good diaphragmatic motion Heart:  S1/S2 systolic murmur no ,rub, gallop or click PMI normal Abdomen: benighn, BS positve, no tenderness, no AAA no bruit.  No HSM or HJR Distal pulses intact with no bruits Plus 2 bilateral  edema Neuro non-focal Skin warm and dry No muscular weakness   Current Outpatient Prescriptions  Medication Sig Dispense Refill  . acetaminophen (TYLENOL) 325 MG tablet Take 650 mg by mouth every 6 (six) hours as needed.        Marland Kitchen atorvastatin (LIPITOR) 20 MG tablet Take 20 mg by mouth daily.        . Calcium Carbonate-Vitamin D 600-400 MG-UNIT per tablet Take 1 tablet by mouth 2 (two) times daily.        . ferrous sulfate 325 (65 FE) MG tablet Take 325 mg by mouth daily with breakfast.        . HYDROcodone-acetaminophen (NORCO) 5-325 MG per tablet Take 1 tablet by mouth every 6 (six) hours as needed.        . insulin aspart (NOVOLOG) 100  UNIT/ML injection Inject 0-15 Units into the skin 3 (three) times daily with meals.  1 vial  0  . insulin glargine (LANTUS) 100 UNIT/ML injection Inject 30 Units into the skin daily.  10 mL  0  . losartan (COZAAR) 100 MG tablet Take 100 mg by mouth daily.        . metoprolol tartrate (LOPRESSOR) 25 MG tablet Take 25 mg by mouth 2 (two) times daily.       . Multiple Vitamins-Minerals (MULTIVITAMINS THER. W/MINERALS) TABS Take 1 tablet by mouth daily.        . pantoprazole (PROTONIX) 40 MG tablet Take 40 mg by mouth daily.        . Rivaroxaban (XARELTO) 15 MG TABS tablet Take 1 tablet (15 mg total) by mouth daily.  30 tablet  0  . sennosides-docusate sodium (SENOKOT-S) 8.6-50 MG tablet Take 2 tablets by mouth at bedtime.        . solifenacin (VESICARE) 5 MG tablet Take 10 mg by mouth daily.        Marland Kitchen zolpidem (AMBIEN) 5 MG tablet Take 5 mg by mouth at bedtime as needed. For sleep.        Allergies  Review of patient's allergies indicates no known allergies.  Electrocardiogram:  Assessment and Plan

## 2011-03-16 NOTE — Assessment & Plan Note (Signed)
Stable dependant  Actually less with weight loss  Continue current dose of diuretic

## 2011-03-16 NOTE — Patient Instructions (Signed)
Your physician wants you to follow-up in:  6 MONTHS WITH DR NISHAN  You will receive a reminder letter in the mail two months in advance. If you don't receive a letter, please call our office to schedule the follow-up appointment. Your physician recommends that you continue on your current medications as directed. Please refer to the Current Medication list given to you today. 

## 2011-03-19 ENCOUNTER — Other Ambulatory Visit (HOSPITAL_BASED_OUTPATIENT_CLINIC_OR_DEPARTMENT_OTHER): Payer: Medicare Other | Admitting: Lab

## 2011-03-19 ENCOUNTER — Ambulatory Visit (HOSPITAL_BASED_OUTPATIENT_CLINIC_OR_DEPARTMENT_OTHER): Payer: Medicare Other

## 2011-03-19 VITALS — BP 121/88 | HR 66 | Temp 97.9°F

## 2011-03-19 DIAGNOSIS — D649 Anemia, unspecified: Secondary | ICD-10-CM

## 2011-03-19 DIAGNOSIS — D509 Iron deficiency anemia, unspecified: Secondary | ICD-10-CM

## 2011-03-19 DIAGNOSIS — N289 Disorder of kidney and ureter, unspecified: Secondary | ICD-10-CM

## 2011-03-19 LAB — CBC WITH DIFFERENTIAL/PLATELET
Basophils Absolute: 0 10*3/uL (ref 0.0–0.1)
EOS%: 1.2 % (ref 0.0–7.0)
HCT: 32.3 % — ABNORMAL LOW (ref 34.8–46.6)
HGB: 10.6 g/dL — ABNORMAL LOW (ref 11.6–15.9)
MCH: 30.5 pg (ref 25.1–34.0)
MONO#: 0.5 10*3/uL (ref 0.1–0.9)
NEUT%: 79.6 % — ABNORMAL HIGH (ref 38.4–76.8)
Platelets: 311 10*3/uL (ref 145–400)
lymph#: 1 10*3/uL (ref 0.9–3.3)

## 2011-03-19 MED ORDER — DARBEPOETIN ALFA-POLYSORBATE 500 MCG/ML IJ SOLN
300.0000 ug | Freq: Once | INTRAMUSCULAR | Status: AC
  Administered 2011-03-19: 300 ug via SUBCUTANEOUS
  Filled 2011-03-19: qty 1

## 2011-03-23 ENCOUNTER — Ambulatory Visit: Payer: Self-pay | Admitting: Pharmacist

## 2011-03-23 DIAGNOSIS — I4891 Unspecified atrial fibrillation: Secondary | ICD-10-CM

## 2011-04-09 ENCOUNTER — Ambulatory Visit (HOSPITAL_BASED_OUTPATIENT_CLINIC_OR_DEPARTMENT_OTHER): Payer: Medicare Other | Admitting: Oncology

## 2011-04-09 ENCOUNTER — Other Ambulatory Visit (HOSPITAL_BASED_OUTPATIENT_CLINIC_OR_DEPARTMENT_OTHER): Payer: Medicare Other | Admitting: Lab

## 2011-04-09 ENCOUNTER — Ambulatory Visit (HOSPITAL_BASED_OUTPATIENT_CLINIC_OR_DEPARTMENT_OTHER): Payer: Medicare Other

## 2011-04-09 VITALS — BP 131/74 | HR 87 | Temp 97.7°F

## 2011-04-09 DIAGNOSIS — D509 Iron deficiency anemia, unspecified: Secondary | ICD-10-CM

## 2011-04-09 DIAGNOSIS — D649 Anemia, unspecified: Secondary | ICD-10-CM

## 2011-04-09 DIAGNOSIS — D631 Anemia in chronic kidney disease: Secondary | ICD-10-CM

## 2011-04-09 DIAGNOSIS — D539 Nutritional anemia, unspecified: Secondary | ICD-10-CM

## 2011-04-09 LAB — CBC WITH DIFFERENTIAL/PLATELET
Basophils Absolute: 0 10*3/uL (ref 0.0–0.1)
EOS%: 1.9 % (ref 0.0–7.0)
HCT: 28.9 % — ABNORMAL LOW (ref 34.8–46.6)
HGB: 9.9 g/dL — ABNORMAL LOW (ref 11.6–15.9)
MCH: 32.7 pg (ref 25.1–34.0)
MCV: 95 fL (ref 79.5–101.0)
MONO%: 6.5 % (ref 0.0–14.0)
NEUT%: 78.8 % — ABNORMAL HIGH (ref 38.4–76.8)

## 2011-04-09 MED ORDER — DARBEPOETIN ALFA-POLYSORBATE 300 MCG/0.6ML IJ SOLN
300.0000 ug | Freq: Once | INTRAMUSCULAR | Status: AC
  Administered 2011-04-09: 300 ug via SUBCUTANEOUS
  Filled 2011-04-09: qty 1

## 2011-04-09 MED ORDER — SODIUM CHLORIDE 0.9 % IV SOLN
INTRAVENOUS | Status: DC
  Administered 2011-04-09: 50 mL via INTRAVENOUS

## 2011-04-09 MED ORDER — FERUMOXYTOL INJECTION 510 MG/17 ML
510.0000 mg | Freq: Once | INTRAVENOUS | Status: AC
  Administered 2011-04-09: 510 mg via INTRAVENOUS
  Filled 2011-04-09: qty 17

## 2011-04-09 NOTE — Patient Instructions (Signed)
[facility] Discharge Instructions for Patients Receiving Today you received the following chemotherapy agents Feraheme and Aranesp  BELOW ARE SYMPTOMS THAT SHOULD BE REPORTED IMMEDIATELY:  *FEVER GREATER THAN 100.5 F  *CHILLS WITH OR WITHOUT FEVER  NAUSEA AND VOMITING THAT IS NOT CONTROLLED WITH YOUR NAUSEA MEDICATION  *UNUSUAL SHORTNESS OF BREATH  *UNUSUAL BRUISING OR BLEEDING  TENDERNESS IN MOUTH AND THROAT WITH OR WITHOUT PRESENCE OF ULCERS  *URINARY PROBLEMS  *BOWEL PROBLEMS  UNUSUAL RASH Items with * indicate a potential emergency and should be followed up as soon as possible.   Feel free to call the clinic you have any questions or concerns. The clinic phone number is [phone].   I have been informed and understand all the instructions given to me. I know to contact the clinic, my physician, or go to the Emergency Department if any problems should occur. I do not have any questions at this time, but understand that I may call the clinic during office hours   should I have any questions or need assistance in obtaining follow up care.    __________________________________________  _____________  __________ Signature of Patient or Authorized Representative            Date                   Time    __________________________________________ Nurse's Signature

## 2011-04-09 NOTE — Progress Notes (Signed)
Hematology and Oncology Follow Up Visit  Kathryn [name] [id number] 01-15-1934 78 y.o. 04/09/2011 3:32 PM  CC: Erick Blinks, MD  Tera Mater. Evlyn Kanner, M.D.   Principle Diagnosis: 78 year old female with normocytic, normochromic anemia. Her anemia is multifactorial due to Fe deficiency as well as renal disease.   Current therapy: Aransep 300 mcg every three weeks as well as po Fe replacement  Interim History:  Kathryn [name] presents today for a follow up visit. She tolerated aransep well with out complications. She is also taking po Fe with out significant changes in her Hgb. She is still experiencing hematuria. She reports  More energy with aransep at this time. She was recently hospitalized in 02/2011 for UTI and dehydration.  She feels much better today. Clinically, she reports that she is regaining a lot of her activities of daily living slowly.   Medications: I have reviewed the patient's current medications. Current outpatient prescriptions:acetaminophen (TYLENOL) 325 MG tablet, Take 650 mg by mouth every 6 (six) hours as needed.  , Disp: , Rfl: ;  atorvastatin (LIPITOR) 20 MG tablet, Take 20 mg by mouth daily.  , Disp: , Rfl: ;  Calcium Carbonate-Vitamin D 600-400 MG-UNIT per tablet, Take 1 tablet by mouth 2 (two) times daily.  , Disp: , Rfl: ;  ferrous sulfate 325 (65 FE) MG tablet, Take 325 mg by mouth daily with breakfast.  , Disp: , Rfl:  HYDROcodone-acetaminophen (NORCO) 5-325 MG per tablet, Take 1 tablet by mouth every 6 (six) hours as needed. 1-2 every 6 hours prn pain, Disp: , Rfl: ;  insulin aspart (NOVOLOG) 100 UNIT/ML injection, Inject 5 Units into the skin 3 (three) times daily with meals. 5 units tid if cbg > 200, Disp: , Rfl: ;  insulin glargine (LANTUS) 100 UNIT/ML injection, Inject 30 Units into the skin daily., Disp: 10 mL, Rfl: 0 losartan (COZAAR) 100 MG tablet, Take 100 mg by mouth daily.  , Disp: , Rfl: ;  metoprolol tartrate (LOPRESSOR) 25 MG tablet, Take 25 mg by mouth 2 (two)  times daily. , Disp: , Rfl: ;  Multiple Vitamins-Minerals (MULTIVITAMINS THER. W/MINERALS) TABS, Take 1 tablet by mouth daily.  , Disp: , Rfl: ;  multivitamin (RENA-VIT) TABS tablet, Take 1 tablet by mouth daily., Disp: , Rfl:  pantoprazole (PROTONIX) 40 MG tablet, Take 40 mg by mouth daily.  , Disp: , Rfl: ;  polyethylene glycol (MIRALAX / GLYCOLAX) packet, Take 17 g by mouth daily., Disp: , Rfl: ;  Rivaroxaban (XARELTO) 15 MG TABS tablet, Take 1 tablet (15 mg total) by mouth daily., Disp: 30 tablet, Rfl: 0;  sennosides-docusate sodium (SENOKOT-S) 8.6-50 MG tablet, Take 2 tablets by mouth at bedtime.  , Disp: , Rfl:  solifenacin (VESICARE) 5 MG tablet, Take 10 mg by mouth daily.  , Disp: , Rfl: ;  zolpidem (AMBIEN) 5 MG tablet, Take 5 mg by mouth at bedtime as needed. For sleep., Disp: , Rfl:   Allergies: No Known Allergies  Past Medical History, Surgical history, Social history, and Family History were reviewed and updated.  Review of Systems: Constitutional:  Negative for fever, chills, night sweats, anorexia, weight loss, pain. Cardiovascular: no chest pain or dyspnea on exertion Respiratory: no cough, shortness of breath, or wheezing Neurological: no TIA or stroke symptoms Dermatological: negative ENT: negative Skin: Negative. Gastrointestinal: no abdominal pain, change in bowel habits, or black or bloody stools Genito-Urinary: no dysuria, trouble voiding, or hematuria Hematological and Lymphatic: negative Breast: negative Musculoskeletal: negative Remaining ROS negative.  Physical Exam: Blood pressure 131/74, pulse 87, temperature 97.7 F (36.5 C). ECOG: 2 General appearance: alert Head: Normocephalic, without obvious abnormality, atraumatic Neck: no adenopathy, no carotid bruit, no JVD, supple, symmetrical, trachea midline and thyroid not enlarged, symmetric, no tenderness/mass/nodules Lymph nodes: Cervical, supraclavicular, and axillary nodes normal. Heart:regular rate and  rhythm, S1, S2 normal, no murmur, click, rub or gallop Lung:chest clear, no wheezing, rales, normal symmetric air entry Abdomin: soft, non-tender, without masses or organomegaly EXT:no erythema, induration, or nodules   Lab Results: Lab Results  Component Value Date   WBC 6.8 04/09/2011   HGB 9.9* 04/09/2011   HCT 28.9* 04/09/2011   MCV 95.0 04/09/2011   PLT 233 04/09/2011     Chemistry      Component Value Date/Time   NA 139 02/24/2011 0320   K 3.4* 02/24/2011 0320   CL 106 02/24/2011 0320   CO2 25 02/24/2011 0320   BUN 18 02/24/2011 0320   CREATININE 1.25* 02/24/2011 0320      Component Value Date/Time   CALCIUM 8.7 02/24/2011 0320   CALCIUM 8.2* 10/10/2010 1613   ALKPHOS 124* 02/24/2011 0320   AST 27 02/24/2011 0320   ALT 31 02/24/2011 0320   BILITOT 0.3 02/24/2011 0320        Impression and Plan: A 78 year old female with normocytic, normochromic anemia. Her anemia is multifactorial due to renal disease and Fe deficiency. Her most Fe studies on 12/20 showed Fe levels at 33 and %Saturations of 18. Given these findings, I will set her up with IV Fe to replace her deficits. Risks and benefits of Feraheme discussed, she is agreeable to proceed.  I will continue aransep to keep her Hgb above 11.  Follow up in 3 months.     Eli Hose, MD 2/1/20133:32 PM

## 2011-04-09 NOTE — Progress Notes (Signed)
Call from Dr. Clelia Croft, pt add on for Aranesp and new Feraheme today.  Pt. Has not have Feraheme before. Per Lanora Manis, pt. Lives in a nursing home and has been paying her own Aranesp.  Pt. Aware that she'll be billed for Feraheme.  Francine Graven. checked with coding and Feraheme costed >$ 800.00.  Pt. Aware and Lanora Manis also spoke to pt's son.  HL

## 2011-04-17 ENCOUNTER — Encounter (HOSPITAL_COMMUNITY): Payer: Self-pay | Admitting: Emergency Medicine

## 2011-04-17 ENCOUNTER — Emergency Department (HOSPITAL_COMMUNITY): Payer: Medicare Other

## 2011-04-17 ENCOUNTER — Inpatient Hospital Stay (HOSPITAL_COMMUNITY)
Admission: EM | Admit: 2011-04-17 | Discharge: 2011-05-06 | DRG: 690 | Disposition: A | Discharge status: Skilled Nursing Facility | Payer: Medicare Other | Attending: Endocrinology | Admitting: Endocrinology

## 2011-04-17 DIAGNOSIS — E119 Type 2 diabetes mellitus without complications: Secondary | ICD-10-CM | POA: Diagnosis present

## 2011-04-17 DIAGNOSIS — Z7901 Long term (current) use of anticoagulants: Secondary | ICD-10-CM

## 2011-04-17 DIAGNOSIS — Z96659 Presence of unspecified artificial knee joint: Secondary | ICD-10-CM

## 2011-04-17 DIAGNOSIS — K5900 Constipation, unspecified: Secondary | ICD-10-CM | POA: Diagnosis not present

## 2011-04-17 DIAGNOSIS — H3530 Unspecified macular degeneration: Secondary | ICD-10-CM | POA: Diagnosis present

## 2011-04-17 DIAGNOSIS — I10 Essential (primary) hypertension: Secondary | ICD-10-CM | POA: Diagnosis present

## 2011-04-17 DIAGNOSIS — Z6834 Body mass index (BMI) 34.0-34.9, adult: Secondary | ICD-10-CM

## 2011-04-17 DIAGNOSIS — N10 Acute tubulo-interstitial nephritis: Principal | ICD-10-CM | POA: Diagnosis present

## 2011-04-17 DIAGNOSIS — N135 Crossing vessel and stricture of ureter without hydronephrosis: Secondary | ICD-10-CM | POA: Diagnosis present

## 2011-04-17 DIAGNOSIS — N12 Tubulo-interstitial nephritis, not specified as acute or chronic: Secondary | ICD-10-CM

## 2011-04-17 DIAGNOSIS — Z794 Long term (current) use of insulin: Secondary | ICD-10-CM

## 2011-04-17 DIAGNOSIS — R609 Edema, unspecified: Secondary | ICD-10-CM | POA: Diagnosis present

## 2011-04-17 DIAGNOSIS — E46 Unspecified protein-calorie malnutrition: Secondary | ICD-10-CM | POA: Diagnosis not present

## 2011-04-17 DIAGNOSIS — F329 Major depressive disorder, single episode, unspecified: Secondary | ICD-10-CM | POA: Diagnosis not present

## 2011-04-17 DIAGNOSIS — E871 Hypo-osmolality and hyponatremia: Secondary | ICD-10-CM | POA: Diagnosis present

## 2011-04-17 DIAGNOSIS — D649 Anemia, unspecified: Secondary | ICD-10-CM | POA: Diagnosis present

## 2011-04-17 DIAGNOSIS — M810 Age-related osteoporosis without current pathological fracture: Secondary | ICD-10-CM | POA: Diagnosis present

## 2011-04-17 DIAGNOSIS — Z87440 Personal history of urinary (tract) infections: Secondary | ICD-10-CM

## 2011-04-17 DIAGNOSIS — N3020 Other chronic cystitis without hematuria: Secondary | ICD-10-CM | POA: Diagnosis present

## 2011-04-17 DIAGNOSIS — N1330 Unspecified hydronephrosis: Secondary | ICD-10-CM | POA: Diagnosis present

## 2011-04-17 DIAGNOSIS — I2510 Atherosclerotic heart disease of native coronary artery without angina pectoris: Secondary | ICD-10-CM | POA: Diagnosis present

## 2011-04-17 DIAGNOSIS — B961 Klebsiella pneumoniae [K. pneumoniae] as the cause of diseases classified elsewhere: Secondary | ICD-10-CM | POA: Diagnosis present

## 2011-04-17 DIAGNOSIS — R110 Nausea: Secondary | ICD-10-CM | POA: Diagnosis present

## 2011-04-17 DIAGNOSIS — J90 Pleural effusion, not elsewhere classified: Secondary | ICD-10-CM | POA: Diagnosis not present

## 2011-04-17 DIAGNOSIS — N289 Disorder of kidney and ureter, unspecified: Secondary | ICD-10-CM | POA: Diagnosis present

## 2011-04-17 DIAGNOSIS — E785 Hyperlipidemia, unspecified: Secondary | ICD-10-CM | POA: Diagnosis present

## 2011-04-17 DIAGNOSIS — D72829 Elevated white blood cell count, unspecified: Secondary | ICD-10-CM | POA: Diagnosis present

## 2011-04-17 DIAGNOSIS — Z7401 Bed confinement status: Secondary | ICD-10-CM

## 2011-04-17 DIAGNOSIS — R627 Adult failure to thrive: Secondary | ICD-10-CM | POA: Diagnosis not present

## 2011-04-17 DIAGNOSIS — D3500 Benign neoplasm of unspecified adrenal gland: Secondary | ICD-10-CM | POA: Diagnosis present

## 2011-04-17 DIAGNOSIS — F3289 Other specified depressive episodes: Secondary | ICD-10-CM | POA: Diagnosis not present

## 2011-04-17 DIAGNOSIS — I4891 Unspecified atrial fibrillation: Secondary | ICD-10-CM | POA: Diagnosis present

## 2011-04-17 DIAGNOSIS — R310 Gross hematuria: Secondary | ICD-10-CM | POA: Diagnosis present

## 2011-04-17 DIAGNOSIS — R32 Unspecified urinary incontinence: Secondary | ICD-10-CM | POA: Diagnosis present

## 2011-04-17 DIAGNOSIS — E669 Obesity, unspecified: Secondary | ICD-10-CM | POA: Diagnosis present

## 2011-04-17 DIAGNOSIS — M1990 Unspecified osteoarthritis, unspecified site: Secondary | ICD-10-CM | POA: Diagnosis present

## 2011-04-17 DIAGNOSIS — N1370 Vesicoureteral-reflux, unspecified: Secondary | ICD-10-CM | POA: Diagnosis present

## 2011-04-17 DIAGNOSIS — R7881 Bacteremia: Secondary | ICD-10-CM | POA: Diagnosis present

## 2011-04-17 LAB — CBC
MCHC: 34.3 g/dL (ref 30.0–36.0)
RDW: 16.3 % — ABNORMAL HIGH (ref 11.5–15.5)
WBC: 11.8 10*3/uL — ABNORMAL HIGH (ref 4.0–10.5)

## 2011-04-17 LAB — DIFFERENTIAL
Basophils Absolute: 0 10*3/uL (ref 0.0–0.1)
Basophils Relative: 0 % (ref 0–1)
Lymphocytes Relative: 2 % — ABNORMAL LOW (ref 12–46)
Neutro Abs: 10.9 10*3/uL — ABNORMAL HIGH (ref 1.7–7.7)
Neutrophils Relative %: 92 % — ABNORMAL HIGH (ref 43–77)

## 2011-04-17 LAB — URINALYSIS, ROUTINE W REFLEX MICROSCOPIC
Nitrite: POSITIVE — AB
Specific Gravity, Urine: 1.014 (ref 1.005–1.030)
Urobilinogen, UA: 0.2 mg/dL (ref 0.0–1.0)

## 2011-04-17 LAB — URINE MICROSCOPIC-ADD ON

## 2011-04-17 LAB — BASIC METABOLIC PANEL
BUN: 29 mg/dL — ABNORMAL HIGH (ref 6–23)
CO2: 24 mEq/L (ref 19–32)
Chloride: 98 mEq/L (ref 96–112)
GFR calc Af Amer: 40 mL/min — ABNORMAL LOW (ref >90)
Glucose, Bld: 178 mg/dL — ABNORMAL HIGH (ref 70–99)
Potassium: 3.9 mEq/L (ref 3.5–5.1)

## 2011-04-17 MED ORDER — ACETAMINOPHEN 500 MG PO TABS
1000.0000 mg | ORAL_TABLET | Freq: Once | ORAL | Status: AC
  Administered 2011-04-17: 1000 mg via ORAL
  Filled 2011-04-17: qty 2

## 2011-04-17 MED ORDER — HYDROCODONE-ACETAMINOPHEN 5-325 MG PO TABS
1.0000 | ORAL_TABLET | Freq: Four times a day (QID) | ORAL | Status: DC | PRN
  Administered 2011-04-17 – 2011-05-06 (×26): 1 via ORAL
  Filled 2011-04-17 (×28): qty 1

## 2011-04-17 MED ORDER — DEXTROSE 5 % IV SOLN
1.0000 g | INTRAVENOUS | Status: DC
  Administered 2011-04-18: 1 g via INTRAVENOUS
  Filled 2011-04-17: qty 10

## 2011-04-17 MED ORDER — MORPHINE SULFATE 4 MG/ML IJ SOLN
4.0000 mg | Freq: Once | INTRAMUSCULAR | Status: AC
  Administered 2011-04-17: 4 mg via INTRAVENOUS
  Filled 2011-04-17: qty 1

## 2011-04-17 MED ORDER — DEXTROSE 5 % IV SOLN
1.0000 g | Freq: Once | INTRAVENOUS | Status: AC
  Administered 2011-04-17: 1 g via INTRAVENOUS
  Filled 2011-04-17: qty 10

## 2011-04-17 NOTE — ED Provider Notes (Signed)
Medical screening examination/treatment/procedure(s) were conducted as a shared visit with non-physician practitioner(s) and myself.  I personally evaluated the patient during the encounter  Loren Racer, MD 04/17/11 2340

## 2011-04-17 NOTE — Progress Notes (Signed)
PCP:  SOUTH,STEPHEN [name], MD, MD  Chief Complaint:  Diffuse weakness, fevers and chills with maximum temperature 103.1 treated with Tylenol,  HPI:  This is a 77-year-old Caucasian female, morbidly obese with a past medical history of insulin-dependent diabetes, hypertension, atrial fibrillation on anticoagulation, on strength right is, morbidly obese with associated gait instability and or carbonate 4 walker with significant assistance, currently room siting at skilled nursing facility. Patient was admitted for probably one week in December with significant urinary tract infection with cultures growing out Klebsiella, treated with Rocephin.  She now presents from that same skilled nursing facility with a one-day history of fevers and chills 103.1F associated with general nonfocal weakness, poor appetite, and one episode of nausea but no vomiting. In the emergency room, patient's physical exam, and initial workup was significant for urinary tract infection, question pyelonephritis.  Review of Systems:  Patient is alert and oriented x3, appropriate and states that she has had the fevers and chills but denies any new visual changes, swallowing difficulties, headaches, new skin rashes, but does admit to chronic issues with decubital ulcers, denies any worsening shortness of breath, chest pain, palpitations, significant change in bowel habits, blood in stool, has chronic issues with gross hematuria that is episodic, patient is incontinent, and wears adult diapers on a regular basis. She does question some dysuria with the last 24 hours, denies any new focal neurological deficits, specifically weakness or numbness in one layer one arm that is new. She admits to a cough approximately one month ago orally several weeks ago but now that is resolved.  Past Medical History:  Past Medical History   Diagnosis  Date   .  Osteoporosis    .  DM (diabetes mellitus)    .  HTN (hypertension)      unspecified   .   Atrial fibrillation    .  Macular degeneration    .  Osteoarthritis    .  Edema    .  Anemia    .  Hydronephrosis      left   .  UTI (lower urinary tract infection)     Past Surgical History   Procedure  Date   .  Replacement total knee  08/2007     bilateral   .  Appendectomy    .  Cholecystectomy    .  Colonoscopy    .  Upper gastrointestinal endoscopy     Medications:  Prior to Admission medications   Medication  Sig  Start Date  End Date  Taking?  Authorizing Provider   acetaminophen (TYLENOL) 325 MG tablet  Take 650 mg by mouth every 6 (six) hours as needed. pain    Yes  Historical Provider, MD   atorvastatin (LIPITOR) 20 MG tablet  Take 20 mg by mouth daily.    Yes  Historical Provider, MD   Calcium Carbonate-Vitamin D 600-400 MG-UNIT per tablet  Take 1 tablet by mouth 2 (two) times daily.    Yes  Historical Provider, MD   ferrous sulfate 325 (65 FE) MG tablet  Take 325 mg by mouth daily with breakfast.    Yes  Historical Provider, MD   HYDROcodone-acetaminophen (NORCO) 5-325 MG per tablet  Take 1 tablet by mouth every 6 (six) hours as needed.    Yes  Historical Provider, MD   insulin aspart (NOVOLOG) 100 UNIT/ML injection  Inject 5 Units into the skin 3 (three) times daily with meals. 5 units tid if cbg > 200    02/24/11  02/24/12  Yes  Stephen [name] South, MD   insulin glargine (LANTUS) 100 UNIT/ML injection  Inject 30 Units into the skin daily.  02/24/11  02/24/12  Yes  Stephen [name] South, MD   losartan (COZAAR) 100 MG tablet  Take 100 mg by mouth daily.    Yes  Historical Provider, MD   metoprolol tartrate (LOPRESSOR) 25 MG tablet  Take 25 mg by mouth 2 (two) times daily.    Yes  Historical Provider, MD   multivitamin (RENA-VIT) TABS tablet  Take 1 tablet by mouth daily.    Yes  Historical Provider, MD   multivitamin (THERAGRAN) per tablet  Take 1 tablet by mouth daily.    Yes  Historical Provider, MD   pantoprazole (PROTONIX) 40 MG tablet  Take 40 mg by mouth daily.    Yes   Historical Provider, MD   polyethylene glycol (MIRALAX / GLYCOLAX) packet  Take 17 g by mouth daily.    Yes  Historical Provider, MD   Rivaroxaban (XARELTO) 15 MG TABS tablet  Take 1 tablet (15 mg total) by mouth daily.  02/24/11   Yes  Stephen [name] South, MD   sennosides-docusate sodium (SENOKOT-S) 8.6-50 MG tablet  Take 2 tablets by mouth at bedtime.    Yes  Historical Provider, MD   solifenacin (VESICARE) 5 MG tablet  Take 10 mg by mouth daily.    Yes  Historical Provider, MD   zolpidem (AMBIEN) 5 MG tablet  Take 5 mg by mouth at bedtime as needed. For sleep.    Yes  Historical Provider, MD   Allergies:  Allergies   Allergen  Reactions   .  Celebrex (Celecoxib)    .  Nsaids    .  Sulfa Antibiotics     Social History:  reports that she has never smoked. She has never used smokeless tobacco. She reports that she does not drink alcohol or use illicit drugs. resides at [facility] with husband  Family History:  Family History   Problem  Relation  Age of Onset   .  Diabetes  Father    .  Atrial fibrillation  Father    .  Breast cancer  Paternal Grandmother    .  Heart disease  Mother     Physical Exam:  Filed Vitals:    04/17/11 1819  04/17/11 1916  04/17/11 2008  04/17/11 2103   BP:  149/100   132/49  133/60   Pulse:  105   94  88   Temp:  101.7 F (38.7 C)  102.4 F (39.1 C)  101.5 F (38.6 C)    TempSrc:  Oral  Rectal  Oral    Resp:  20   17    Height:  5' 9" (1.753 m)      Weight:  99.791 kg (220 lb)      SpO2:  94%   97%  94%    Clinical exam  Patient morbidly obese, but alert and oriented x3, answering all questions appropriately, with continued fever as noted above  Sclera anicteric! Are intact pupils are equal and react and reactive to light, face is symmetric, there is no oropharyngeal lesions, tongue is midline, no facial asymmetries, no cervical lymphadenopathy  Lungs are clear to auscultation bilaterally with no rhonchi or wheezing or respiratory distress or  accessory muscle usage  Cardiovascular reveals a regular rate and rhythm with normal heart rate  Abdomen soft, nondistended, morbidly obese, subjectively diffusely tender after   placement of Foley catheter and removal, no rebound, no focal tenderness ascertained  Extremity exam reveals trace edema pedal pulses are intact, as externally his are warm with no cyanosis, no rash noted  Neurological exam cranial nerves II through XII grossly intact, patient alert and oriented x3, patient can move all 4 extremities, tone appears intact  Labs on Admission:   Basename  04/17/11 1845   NA  133*   K  3.9   CL  98   CO2  24   GLUCOSE  178*   BUN  29*   CREATININE  1.45*   CALCIUM  9.6   MG  --   PHOS  --    No results found for this basename: AST:2,ALT:2,ALKPHOS:2,BILITOT:2,PROT:2,ALBUMIN:2 in the last 72 hours  No results found for this basename: LIPASE:2,AMYLASE:2 in the last 72 hours   Basename  04/17/11 1845   WBC  11.8*   NEUTROABS  10.9*   HGB  9.9*   HCT  28.9*   MCV  93.5   PLT  213    No results found for this basename: CKTOTAL:3,CKMB:3,CKMBINDEX:3,TROPONINI:3 in the last 72 hours  No results found for this basename: TSH,T4TOTAL,FREET3,T3FREE,THYROIDAB in the last 72 hours  No results found for this basename: VITAMINB12:2,FOLATE:2,FERRITIN:2,TIBC:2,IRON:2,RETICCTPCT:2 in the last 72 hours  Radiological Exams on Admission:  Dg Pneumonia Chest 2v  04/17/2011 *RADIOLOGY REPORT* Clinical Data: Fever. Rule out pneumonia CHEST - 2 VIEW Comparison: 02/17/2011 Findings: Negative for pneumonia. Mild atelectasis in the right base. Negative for heart failure or effusion. IMPRESSION: Mild right lower lobe atelectasis, negative for pneumonia. Original Report Authenticated By: DAVID C. CLARK, M.D.  Orders placed during the hospital encounter of 10/10/10   .  EKG   .  EKG   .  EKG    Assessment/Plan  Urinary tract infection with question pyelonephritis, blood cultures pending, urine culture  pending, history of recent Klebsiella infection, will be treated empirically with Rocephin, and with close followup on cultures, patient hemodynamically stable, will follow white blood cell count, slightly elevated compared with baseline approximately 7-8 secondary to urinary tract infection, chest x-ray unremarkable, nasal swab for influenza pending in the emergency room however it appears that given dysuria this is probably related to urinary tract infection. Patient denies cough, sore throat  Atrial fibrillation-, on appropriate anticoagulation for age and renal parameters, hemodynamically stable, in sinus rhythm by exam  Hypertension-controlled, will continue home medications  Diabetes appears controlled historically, will continue home medications  Anemia-hemoglobin 9.9 stable compared with previous records, patient does have a history of gross hematuria, followed by urology, with no need for further evaluation based on reports from hospitalization in December of 2012. Patient is also followed by hematology with her normocytic, normochromic anemia thought to be multifactorial due to iron deficiency as well as renal disease. She is currently on Aranesp therapy every 3 weeks  Disposition-pending management of fever, urinary tract infection, probable disposition back to [facility]  Orlinda Slomski R  04/17/2011, 10:02 PM  

## 2011-04-17 NOTE — ED Notes (Signed)
Spoke with [employer], last Tylenol 650mg  given at 1530.

## 2011-04-17 NOTE — ED Notes (Signed)
Awaiting phleb to draw BC before starting antbx

## 2011-04-17 NOTE — ED Notes (Signed)
Pt transported from [facility] with c/o fever chills onset this AM, pt A & O.

## 2011-04-17 NOTE — ED Notes (Signed)
Pt sent to ED from [facility], pt c/o shaking and chills onset this am. Pt states she has had Tylenol x 2 today, pt continues to be febrile. Foley cath placed for clean urine sample, urine malodorous, red in color, 15cc return.

## 2011-04-17 NOTE — ED Notes (Signed)
GLO:VF64<PP> Expected date:<BR> Expected time: 6:05 PM<BR> Means of arrival:<BR> Comments:<BR> M40 - 77yoF Fever

## 2011-04-17 NOTE — ED Provider Notes (Signed)
History     CSN: [id number]  Arrival date & time 04/17/11  1810   First MD Initiated Contact with Patient 04/17/11 1843      Chief Complaint  Patient presents with  . Fever    (Consider location/radiation/quality/duration/timing/severity/associated sxs/prior treatment) HPI History provided by pt.   Pt sent from [facility] for fever and chills since this am.  Max temp 103.1.  Has been treated w/ two doses of tylenol.  Pt reports that she had a cough several weeks ago but it has resolved and denies CP/SOB.  Has had nausea but no abdominal pain or diarrhea.  Has chronic urinary incontinence but did experience mild dysuria yesterday.  Has had UTIs in the past.  H/o diabetes.    Past Medical History  Diagnosis Date  . Osteoporosis   . DM (diabetes mellitus)   . HTN (hypertension)     unspecified  . Atrial fibrillation   . Macular degeneration   . Osteoarthritis   . Edema   . Anemia   . Hydronephrosis     left  . UTI (lower urinary tract infection)     Past Surgical History  Procedure Date  . Replacement total knee 08/2007    bilateral  . Appendectomy   . Cholecystectomy   . Colonoscopy   . Upper gastrointestinal endoscopy     Family History  Problem Relation Age of Onset  . Diabetes Father   . Atrial fibrillation Father   . Breast cancer Paternal Grandmother   . Heart disease Mother     History  Substance Use Topics  . Smoking status: Never Smoker   . Smokeless tobacco: Never Used  . Alcohol Use: No    OB History    Grav Para Term Preterm Abortions TAB SAB Ect Mult Living                  Review of Systems  All other systems reviewed and are negative.    Allergies  Celebrex; Nsaids; and Sulfa antibiotics  Home Medications   Current Outpatient Rx  Name Route Sig Dispense Refill  . ACETAMINOPHEN 325 MG PO TABS Oral Take 650 mg by mouth every 6 (six) hours as needed. pain    . ATORVASTATIN CALCIUM 20 MG PO TABS Oral Take 20 mg by mouth daily.        Marland Kitchen CALCIUM CARBONATE-VITAMIN D 600-400 MG-UNIT PO TABS Oral Take 1 tablet by mouth 2 (two) times daily.      Marland Kitchen FERROUS SULFATE 325 (65 FE) MG PO TABS Oral Take 325 mg by mouth daily with breakfast.      . HYDROCODONE-ACETAMINOPHEN 5-325 MG PO TABS Oral Take 1 tablet by mouth every 6 (six) hours as needed.     . INSULIN ASPART 100 UNIT/ML [place] SOLN Subcutaneous Inject 5 Units into the skin 3 (three) times daily with meals. 5 units tid if cbg > 200    . INSULIN GLARGINE 100 UNIT/ML [place] SOLN Subcutaneous Inject 30 Units into the skin daily. 10 mL 0  . LOSARTAN POTASSIUM 100 MG PO TABS Oral Take 100 mg by mouth daily.      Marland Kitchen METOPROLOL TARTRATE 25 MG PO TABS Oral Take 25 mg by mouth 2 (two) times daily.     Marland Kitchen RENA-VITE PO TABS Oral Take 1 tablet by mouth daily.    . MULTIVITAMINS PO TABS Oral Take 1 tablet by mouth daily.    Marland Kitchen PANTOPRAZOLE SODIUM 40 MG PO TBEC Oral Take 40  mg by mouth daily.      Marland Kitchen POLYETHYLENE GLYCOL 3350 PO PACK Oral Take 17 g by mouth daily.    Marland Kitchen RIVAROXABAN 15 MG PO TABS Oral Take 1 tablet (15 mg total) by mouth daily. 30 tablet 0  . SENNA-DOCUSATE SODIUM 8.6-50 MG PO TABS Oral Take 2 tablets by mouth at bedtime.      . SOLIFENACIN SUCCINATE 5 MG PO TABS Oral Take 10 mg by mouth daily.      Marland Kitchen ZOLPIDEM TARTRATE 5 MG PO TABS Oral Take 5 mg by mouth at bedtime as needed. For sleep.      BP 149/100  Pulse 105  Temp(Src) 101.7 F (38.7 C) (Oral)  Resp 20  Ht 5\' 9"  (1.753 m)  Wt 220 lb (99.791 kg)  BMI 32.49 kg/m2  SpO2 94%  Physical Exam  Nursing note and vitals reviewed. Constitutional: She is oriented to person, place, and time. She appears well-developed and well-nourished. No distress.       Morbidly obese  HENT:  Head: Normocephalic and atraumatic.  Eyes:       Normal appearance  Neck: Normal range of motion.  Cardiovascular: Normal rate and regular rhythm.        HR upper 80s  Pulmonary/Chest: Effort normal and breath sounds normal.  Abdominal: Soft. Bowel  sounds are normal. She exhibits no distension. There is no tenderness.       No CVA ttp  Genitourinary:       Foley in place.  Very little urinary output and urine blood-tinged.  Neurological: She is alert and oriented to person, place, and time.  Skin: Skin is warm and dry. No rash noted.  Psychiatric: She has a normal mood and affect. Her behavior is normal.    ED Course  Procedures (including critical care time)  Labs Reviewed  CBC - Abnormal; Notable for the following:    WBC 11.8 (*)    RBC 3.09 (*)    Hemoglobin 9.9 (*)    HCT 28.9 (*)    RDW 16.3 (*)    All other components within normal limits  DIFFERENTIAL - Abnormal; Notable for the following:    Neutrophils Relative 92 (*)    Neutro Abs 10.9 (*)    Lymphocytes Relative 2 (*)    Lymphs Abs 0.2 (*)    All other components within normal limits  URINALYSIS, ROUTINE W REFLEX MICROSCOPIC - Abnormal; Notable for the following:    Color, Urine RED (*) BIOCHEMICALS MAY BE AFFECTED BY COLOR   APPearance TURBID (*)    Hgb urine dipstick LARGE (*)    Ketones, ur TRACE (*)    Protein, ur >300 (*)    Nitrite POSITIVE (*)    Leukocytes, UA LARGE (*)    All other components within normal limits  BASIC METABOLIC PANEL - Abnormal; Notable for the following:    Sodium 133 (*)    Glucose, Bld 178 (*)    BUN 29 (*)    Creatinine, Ser 1.45 (*)    GFR calc non Af Amer 34 (*)    GFR calc Af Amer 40 (*)    All other components within normal limits  URINE MICROSCOPIC-ADD ON - Abnormal; Notable for the following:    Bacteria, UA FEW (*)    All other components within normal limits  LACTIC ACID, PLASMA  URINE CULTURE  INFLUENZA PANEL BY PCR   Dg Pneumonia Chest 2v  04/17/2011  *RADIOLOGY REPORT*  Clinical Data: Fever.  Rule out pneumonia  CHEST - 2 VIEW  Comparison: 02/17/2011  Findings: Negative for pneumonia.  Mild atelectasis in the right base.  Negative for heart failure or effusion.  IMPRESSION:   Mild right lower lobe  atelectasis, negative for pneumonia.  Original Report Authenticated By: Camelia Phenes, M.D.     1. Pyelonephritis       MDM  Pt sent from SNF for fever/chills, onset today.  Associated w/ nausea and dysuria.  Febrile on exam but otherwise, no acute findings.  CXR neg for pneumonia.  U/A positive for infection.  Labs otherwise unremarkable w/ exception of lactate which is pending.  Likely has pyelo.  She has received po tylenol and IV rocephin ordered.  Per prior chart, treated w/ rocephin during most recent admission for UTI in 02/2011.  Urine culture pending. She is now complaining of pain mid-line lower abdomen since foley placed.  Foley removed and pain did not resolve.  4mg  IV morphine ordered.  [facility] consulted for admission.          Otilio Miu, [place] 04/17/11 2040

## 2011-04-18 ENCOUNTER — Encounter (HOSPITAL_COMMUNITY): Payer: Self-pay | Admitting: *Deleted

## 2011-04-18 LAB — COMPREHENSIVE METABOLIC PANEL
AST: 35 U/L (ref 0–37)
Albumin: 2.4 g/dL — ABNORMAL LOW (ref 3.5–5.2)
Alkaline Phosphatase: 107 U/L (ref 39–117)
Chloride: 98 mEq/L (ref 96–112)
Potassium: 4.9 mEq/L (ref 3.5–5.1)
Sodium: 131 mEq/L — ABNORMAL LOW (ref 135–145)
Total Bilirubin: 0.5 mg/dL (ref 0.3–1.2)

## 2011-04-18 LAB — CBC
Platelets: 200 10*3/uL (ref 150–400)
RDW: 16.5 % — ABNORMAL HIGH (ref 11.5–15.5)
WBC: 9.6 10*3/uL (ref 4.0–10.5)

## 2011-04-18 LAB — GLUCOSE, CAPILLARY
Glucose-Capillary: 225 mg/dL — ABNORMAL HIGH (ref 70–99)
Glucose-Capillary: 184 mg/dL — ABNORMAL HIGH (ref 70–99)
Glucose-Capillary: 150 mg/dL — ABNORMAL HIGH (ref 70–99)

## 2011-04-18 LAB — INFLUENZA PANEL BY PCR (TYPE A & B)
H1N1 flu by pcr: NOT DETECTED
Influenza B By PCR: NEGATIVE

## 2011-04-18 MED ORDER — POLYETHYLENE GLYCOL 3350 17 G PO PACK
17.0000 g | PACK | Freq: Every day | ORAL | Status: DC
  Administered 2011-04-18 – 2011-04-21 (×4): 17 g via ORAL
  Filled 2011-04-18 (×5): qty 1

## 2011-04-18 MED ORDER — RENA-VITE PO TABS
1.0000 | ORAL_TABLET | Freq: Every day | ORAL | Status: DC
  Administered 2011-04-18 – 2011-05-06 (×17): 1 via ORAL
  Filled 2011-04-18 (×22): qty 1

## 2011-04-18 MED ORDER — MORPHINE SULFATE 4 MG/ML IJ SOLN
4.0000 mg | INTRAMUSCULAR | Status: DC | PRN
  Administered 2011-04-18 – 2011-05-02 (×41): 4 mg via INTRAVENOUS
  Filled 2011-04-18 (×41): qty 1

## 2011-04-18 MED ORDER — DEXTROSE 5 % IV SOLN
1.0000 g | Freq: Every day | INTRAVENOUS | Status: DC
  Administered 2011-04-18: 1 g via INTRAVENOUS
  Filled 2011-04-18: qty 10

## 2011-04-18 MED ORDER — METOPROLOL TARTRATE 25 MG PO TABS
25.0000 mg | ORAL_TABLET | Freq: Two times a day (BID) | ORAL | Status: DC
  Administered 2011-04-18 – 2011-05-06 (×37): 25 mg via ORAL
  Filled 2011-04-18 (×42): qty 1

## 2011-04-18 MED ORDER — ACETAMINOPHEN 325 MG PO TABS
650.0000 mg | ORAL_TABLET | Freq: Four times a day (QID) | ORAL | Status: DC | PRN
  Administered 2011-04-21 – 2011-05-05 (×3): 650 mg via ORAL
  Filled 2011-04-18 (×3): qty 2

## 2011-04-18 MED ORDER — PANTOPRAZOLE SODIUM 40 MG PO TBEC
40.0000 mg | DELAYED_RELEASE_TABLET | Freq: Every day | ORAL | Status: DC
  Administered 2011-04-18 – 2011-05-06 (×18): 40 mg via ORAL
  Filled 2011-04-18 (×21): qty 1

## 2011-04-18 MED ORDER — FERROUS SULFATE 325 (65 FE) MG PO TABS
325.0000 mg | ORAL_TABLET | Freq: Every day | ORAL | Status: DC
  Administered 2011-04-18 – 2011-05-06 (×18): 325 mg via ORAL
  Filled 2011-04-18 (×20): qty 1

## 2011-04-18 MED ORDER — ZOLPIDEM TARTRATE 5 MG PO TABS
5.0000 mg | ORAL_TABLET | Freq: Every evening | ORAL | Status: DC | PRN
  Administered 2011-04-18 – 2011-05-05 (×18): 5 mg via ORAL
  Filled 2011-04-18 (×19): qty 1

## 2011-04-18 MED ORDER — ONDANSETRON HCL 4 MG/2ML IJ SOLN
4.0000 mg | Freq: Four times a day (QID) | INTRAMUSCULAR | Status: DC | PRN
  Administered 2011-04-18 – 2011-05-04 (×14): 4 mg via INTRAVENOUS
  Filled 2011-04-18 (×15): qty 2

## 2011-04-18 MED ORDER — LOSARTAN POTASSIUM 50 MG PO TABS
100.0000 mg | ORAL_TABLET | Freq: Every day | ORAL | Status: DC
  Administered 2011-04-18 – 2011-04-23 (×6): 100 mg via ORAL
  Filled 2011-04-18 (×9): qty 2

## 2011-04-18 MED ORDER — INSULIN ASPART 100 UNIT/ML ~~LOC~~ SOLN
5.0000 [IU] | Freq: Three times a day (TID) | SUBCUTANEOUS | Status: DC
  Administered 2011-04-18 (×2): 5 [IU] via SUBCUTANEOUS
  Filled 2011-04-18: qty 3

## 2011-04-18 MED ORDER — SODIUM CHLORIDE 0.9 % IV SOLN
INTRAVENOUS | Status: DC
  Administered 2011-04-18: 75 mL via INTRAVENOUS
  Administered 2011-04-18: 12:00:00 via INTRAVENOUS
  Administered 2011-04-19: 1000 mL via INTRAVENOUS
  Administered 2011-04-19 – 2011-04-20 (×2): via INTRAVENOUS
  Administered 2011-04-20: 75 mL/h via INTRAVENOUS
  Administered 2011-04-21: 1000 mL via INTRAVENOUS
  Administered 2011-04-23 – 2011-04-24 (×2): via INTRAVENOUS
  Administered 2011-04-24: 75 mL/h via INTRAVENOUS
  Administered 2011-04-26 (×2): via INTRAVENOUS
  Administered 2011-04-26 – 2011-04-28 (×5): 1000 mL via INTRAVENOUS
  Administered 2011-04-28: 17:00:00 via INTRAVENOUS
  Administered 2011-04-29: 1000 mL via INTRAVENOUS
  Administered 2011-04-29 – 2011-04-30 (×3): via INTRAVENOUS
  Administered 2011-04-30: 1000 mL via INTRAVENOUS
  Administered 2011-04-30: via INTRAVENOUS
  Administered 2011-05-01: 1000 mL via INTRAVENOUS
  Administered 2011-05-01 – 2011-05-04 (×6): via INTRAVENOUS

## 2011-04-18 MED ORDER — INSULIN GLARGINE 100 UNIT/ML ~~LOC~~ SOLN
30.0000 [IU] | Freq: Every day | SUBCUTANEOUS | Status: DC
  Administered 2011-04-18 (×2): 30 [IU] via SUBCUTANEOUS
  Filled 2011-04-18: qty 3

## 2011-04-18 MED ORDER — INSULIN ASPART 100 UNIT/ML ~~LOC~~ SOLN
0.0000 [IU] | Freq: Three times a day (TID) | SUBCUTANEOUS | Status: DC
  Administered 2011-04-18: 3 [IU] via SUBCUTANEOUS
  Administered 2011-04-19 – 2011-04-20 (×2): 2 [IU] via SUBCUTANEOUS
  Administered 2011-04-21 (×2): 3 [IU] via SUBCUTANEOUS
  Administered 2011-04-22: 2 [IU] via SUBCUTANEOUS
  Administered 2011-04-22 (×2): 3 [IU] via SUBCUTANEOUS
  Administered 2011-04-23 – 2011-04-24 (×3): 2 [IU] via SUBCUTANEOUS
  Administered 2011-04-25: 3 [IU] via SUBCUTANEOUS
  Administered 2011-04-25: 2 [IU] via SUBCUTANEOUS
  Administered 2011-04-26: 5 [IU] via SUBCUTANEOUS
  Administered 2011-04-26 – 2011-05-01 (×6): 2 [IU] via SUBCUTANEOUS
  Administered 2011-05-02: 3 [IU] via SUBCUTANEOUS
  Administered 2011-05-03: 2 [IU] via SUBCUTANEOUS

## 2011-04-18 MED ORDER — ONDANSETRON HCL 4 MG PO TABS
4.0000 mg | ORAL_TABLET | Freq: Four times a day (QID) | ORAL | Status: DC | PRN
  Administered 2011-05-04 – 2011-05-05 (×2): 4 mg via ORAL
  Filled 2011-04-18 (×2): qty 1

## 2011-04-18 MED ORDER — ROSUVASTATIN CALCIUM 20 MG PO TABS
20.0000 mg | ORAL_TABLET | Freq: Every day | ORAL | Status: DC
  Administered 2011-04-18 – 2011-05-06 (×18): 20 mg via ORAL
  Filled 2011-04-18 (×20): qty 1

## 2011-04-18 MED ORDER — RIVAROXABAN 10 MG PO TABS
15.0000 mg | ORAL_TABLET | Freq: Every day | ORAL | Status: DC
  Administered 2011-04-18 – 2011-04-27 (×10): 15 mg via ORAL
  Filled 2011-04-18 (×11): qty 2

## 2011-04-18 MED ORDER — DARIFENACIN HYDROBROMIDE ER 7.5 MG PO TB24
7.5000 mg | ORAL_TABLET | Freq: Every day | ORAL | Status: DC
  Administered 2011-04-18 – 2011-05-06 (×18): 7.5 mg via ORAL
  Filled 2011-04-18 (×20): qty 1

## 2011-04-18 MED ORDER — INSULIN ASPART 100 UNIT/ML ~~LOC~~ SOLN
5.0000 [IU] | Freq: Three times a day (TID) | SUBCUTANEOUS | Status: DC
  Administered 2011-04-18 – 2011-05-01 (×21): 5 [IU] via SUBCUTANEOUS
  Filled 2011-04-18: qty 3

## 2011-04-18 MED ORDER — INSULIN GLARGINE 100 UNIT/ML ~~LOC~~ SOLN
30.0000 [IU] | Freq: Every day | SUBCUTANEOUS | Status: DC
  Administered 2011-04-18 – 2011-05-01 (×14): 30 [IU] via SUBCUTANEOUS
  Filled 2011-04-18 (×2): qty 3

## 2011-04-18 MED ORDER — INSULIN GLARGINE 100 UNIT/ML ~~LOC~~ SOLN
30.0000 [IU] | SUBCUTANEOUS | Status: DC
  Filled 2011-04-18: qty 3

## 2011-04-18 MED ORDER — SENNOSIDES-DOCUSATE SODIUM 8.6-50 MG PO TABS
2.0000 | ORAL_TABLET | Freq: Every day | ORAL | Status: DC
  Administered 2011-04-18 – 2011-04-25 (×8): 2 via ORAL
  Filled 2011-04-18 (×10): qty 2

## 2011-04-18 NOTE — Progress Notes (Signed)
Dr. Timothy Lasso notified blood culture result gram negative rods,patient on rocephin ! Gram daily. No new orders received via telephone at this time .

## 2011-04-18 NOTE — Progress Notes (Addendum)
Subjective: Patient states that she continued to have some episodic issues with abdominal diffuse pain, treated with morphine, and hydrocodone intermittently, with some nausea and vomiting of unclear etiology possibly related to her infection versus her chronic pain administration. Currently doing much better, actually eating lunch, no current nausea or vomiting, denies any shaking chills, fever curve improved. Has been wetting her pads but unclear amounts of urine output. No breathing difficulties, coughing, chest pain, shortness of breath. Feels that her abdominal discomfort is more related to the gagging and nausea earlier.  Objective: Vital signs in last 24 hours: Temp:  [98.5 F (36.9 C)-102.4 F (39.1 C)] 98.5 F (36.9 C) (02/10 0540) Pulse Rate:  [64-105] 64  (02/10 0540) Resp:  [17-20] 20  (02/10 0540) BP: (132-151)/(49-100) 151/87 mmHg (02/10 0540) SpO2:  [94 %-97 %] 97 % (02/10 0540) Weight:  [99.791 kg (220 lb)-105.9 kg (233 lb 7.5 oz)] 105.9 kg (233 lb 7.5 oz) (02/10 0001) Weight change:  Last BM Date: 04/17/11  CBG (last 3)   Basename 04/18/11 1157 04/18/11 0824  GLUCAP 225* 237*    Intake/Output from previous day: 02/09 0701 - 02/10 0700 In: 638.8 [P.O.:300; I.V.:338.8] Out: -  Intake/Output this shift: Total I/O In: 120 [P.O.:120] Out: -    Physical exam Morbidly obese, no apparent distress, conversant, answering questions appropriately, no shaking chills No oropharyngeal lesions, nor is there any erythema, negative for cervical lymphadenopathy Lungs clear to auscultation bilaterally Cardiovascular reveals irregular rhythm Abdomen soft, nondistended but morbidly obese and diffuse subjective tenderness no significant issues with worsening discomfort in the suprapubic area Trace to 1+ edema, pedal pulses intact, no evidence of cyanosis,  Lab Results:  [facility] 04/18/11 0335 04/17/11 1845  NA 131* 133*  K 4.9 3.9  CL 98 98  CO2 26 24  GLUCOSE 288* 178*    BUN 34* 29*  CREATININE 1.92* 1.45*  CALCIUM 9.1 9.6  MG -- --  PHOS -- --    Basename 04/18/11 0335  AST 35  ALT 27  ALKPHOS 107  BILITOT 0.5  PROT 5.9*  ALBUMIN 2.4*    Basename 04/18/11 0335 04/17/11 1845  WBC 9.6 11.8*  NEUTROABS -- 10.9*  HGB 9.3* 9.9*  HCT 27.9* 28.9*  MCV 94.9 93.5  PLT 200 213   No results found for this basename: CKTOTAL:3,CKMB:3,CKMBINDEX:3,TROPONINI:3 in the last 72 hours No results found for this basename: TSH,T4TOTAL,FREET3,T3FREE,THYROIDAB in the last 72 hours No results found for this basename: VITAMINB12:2,FOLATE:2,FERRITIN:2,TIBC:2,IRON:2,RETICCTPCT:2 in the last 72 hours  Studies/Results: Dg Pneumonia Chest 2v  04/17/2011  *RADIOLOGY REPORT*  Clinical Data: Fever.  Rule out pneumonia  CHEST - 2 VIEW  Comparison: 02/17/2011  Findings: Negative for pneumonia.  Mild atelectasis in the right base.  Negative for heart failure or effusion.  IMPRESSION:   Mild right lower lobe atelectasis, negative for pneumonia.  Original Report Authenticated By: Camelia Phenes, M.D.     Medications: Scheduled:   . acetaminophen  1,000 mg Oral Once  . cefTRIAXone (ROCEPHIN)  IV  1 g Intravenous Once  . cefTRIAXone (ROCEPHIN)  IV  1 g Intravenous QHS  . darifenacin  7.5 mg Oral Daily  . ferrous sulfate  325 mg Oral Q breakfast  . insulin aspart  5 Units Subcutaneous TID WC  . insulin glargine  30 Units Subcutaneous QHS  . losartan  100 mg Oral Daily  . metoprolol tartrate  25 mg Oral BID  .  morphine injection  4 mg Intravenous Once  . multivitamin  1 tablet Oral Daily  . pantoprazole  40 mg Oral Daily  . polyethylene glycol  17 g Oral Daily  . rivaroxaban  15 mg Oral Daily  . rosuvastatin  20 mg Oral Daily  . senna-docusate  2 tablet Oral QHS  . DISCONTD: cefTRIAXone (ROCEPHIN)  IV  1 g Intravenous Q24H  . DISCONTD: insulin glargine  30 Units Subcutaneous Q24H   Continuous:   . sodium chloride 75 mL/hr at 04/18/11 1150     Assessment/Plan: Urinary tract infection with question pyelonephritis, and long history of gross hematuria evaluated by urology, blood cultures and urine culture pending, recent history Klebsiella infection, now treated empirically with Rocephin, temperature curve has improved, we'll followup on cultures, patient remained hemodynamically stable, white blood cell count marginally better however complicated by worsening renal parameters. Fever-now resolved, treated with Rocephin empirically, PCR for influenza still pending and still on droplet precautions as a result. Doubt influenza Atrial fibrillation, on appropriate anticoagulation for age and renal parameters, hemodynamically stable, probably in atrial fibrillation by exam, well rate controlled Hypertension well-controlled on current medications Diabetes-slightly uncontrolled in the setting of active infection with sugars in excess of 200, will add sliding scale insulin Anemia-slightly lower with IV fluids at 9.3, with continued to monitor, thought to be multifactorial with history of gross hematuria as well as normocytic normochromic anemia with iron deficiency as well as renal disease Disposition pending management of the fever, urinary tract infection and probable disposition back to original skilled nursing facility.   LOS: 1 day   Kaimana Neuzil R 04/18/2011, 12:48 PM  With worsening renal insufficiency, will check bladder scan, may need urology consult and/or renal ultrasound if no improvement.

## 2011-04-18 NOTE — Progress Notes (Signed)
Droplet precautions in place -awaiting influenza PCR(obtained in ED) results

## 2011-04-19 ENCOUNTER — Inpatient Hospital Stay (HOSPITAL_COMMUNITY): Payer: Medicare Other

## 2011-04-19 LAB — COMPREHENSIVE METABOLIC PANEL
BUN: 44 mg/dL — ABNORMAL HIGH (ref 6–23)
CO2: 22 mEq/L (ref 19–32)
Calcium: 8.7 mg/dL (ref 8.4–10.5)
Creatinine, Ser: 2.43 mg/dL — ABNORMAL HIGH (ref 0.50–1.10)
GFR calc Af Amer: 21 mL/min — ABNORMAL LOW (ref >90)
GFR calc non Af Amer: 18 mL/min — ABNORMAL LOW (ref >90)
Glucose, Bld: 142 mg/dL — ABNORMAL HIGH (ref 70–99)

## 2011-04-19 LAB — CBC
HCT: 27.1 % — ABNORMAL LOW (ref 36.0–46.0)
Hemoglobin: 9.1 g/dL — ABNORMAL LOW (ref 12.0–15.0)
MCH: 31.7 pg (ref 26.0–34.0)
MCV: 94.4 fL (ref 78.0–100.0)
Platelets: 207 10*3/uL (ref 150–400)
RBC: 2.87 MIL/uL — ABNORMAL LOW (ref 3.87–5.11)

## 2011-04-19 LAB — URINE CULTURE

## 2011-04-19 LAB — GLUCOSE, CAPILLARY
Glucose-Capillary: 102 mg/dL — ABNORMAL HIGH (ref 70–99)
Glucose-Capillary: 105 mg/dL — ABNORMAL HIGH (ref 70–99)

## 2011-04-19 MED ORDER — SODIUM CHLORIDE 0.9 % IV SOLN
250.0000 mg | Freq: Four times a day (QID) | INTRAVENOUS | Status: DC
  Administered 2011-04-19 – 2011-04-20 (×5): 250 mg via INTRAVENOUS
  Filled 2011-04-19 (×8): qty 250

## 2011-04-19 MED ORDER — POLYETHYLENE GLYCOL 3350 17 G PO PACK: 17.0000 g | PACK | Freq: Every day | ORAL | Status: DC

## 2011-04-19 NOTE — Progress Notes (Signed)
OT Cancellation Note  Treatment cancelled today due to medical issues with patient which prohibited therapy. Pt w/ c/o nausea, fatigue. Will check back another day.  Kathryn [name] A 04/19/2011, 2:22 PM

## 2011-04-19 NOTE — Progress Notes (Signed)
PT Cancellation Note  Treatment cancelled today due to medical issues with patient which prohibited therapy.  Pt nauseated and defers PT x 2 attempts  Donnetta Hail 04/19/2011, 2:17 PM

## 2011-04-19 NOTE — Progress Notes (Signed)
Inpatient Diabetes Program Recommendations  AACE/ADA: New Consensus Statement on Inpatient Glycemic Control (2009)  Target Ranges:  Prepandial:   less than 140 mg/dL      Peak postprandial:   less than 180 mg/dL (1-2 hours)      Critically ill patients:  140 - 180 mg/dL   Reason for Visit: Hyperglycemia now resolved  Inpatient Diabetes Program Recommendations Insulin - Basal: noted order for Lantus 30 units at HS to start 04/18/11.  Noted Lantus ordered and documented as being given before HS and shortly after HS?  Not sure if pt received one dose of 30 units or 2 doses.? Will follow.  Note: Concern that pt received 2 doses Lantus last HS vs 1. Thank you, Lenor Coffin, RN, CNS, Diabetes Coordinator [phone])

## 2011-04-19 NOTE — Progress Notes (Signed)
CSW met with Pt, she is from [facility] and can return per facility. Pt will need PT/OT consult. CSW provided support to patient. She was sad about being in the hospital. Vennie Homans, [place] 04/19/2011 12:06 PM [phone]

## 2011-04-19 NOTE — Consult Note (Signed)
WOC consult Note Reason for Consult:Stage II (partial thickness) skin breakdown noted on admission. Patient not seen (consult request discontinued) following discussion with RN and RN IV.  Stage II skin breakdown, partial thickness, is managed by protocol.  Nursing staff will assess and manage with topical therapies, turning and repositioning, moisture management and nutritional support. I will remain available to the staff and this patient and her medical team.  Please reconsult as needed. Ladona Mow, MSN, RN, [facility], CWOCN [phone])

## 2011-04-19 NOTE — Progress Notes (Signed)
CSW received order for SNF. Pt needs PT/OT consult to assist with plan. CSW will follow accordingly.  Vennie Homans, [place] 04/19/2011  10:05 AM [phone]

## 2011-04-19 NOTE — Progress Notes (Signed)
ANTIBIOTIC CONSULT NOTE - INITIAL  Pharmacy Consult for Primaxin Indication: GNR in 1/2 blcx  Allergies  Allergen Reactions  . Celebrex (Celecoxib)   . Nsaids   . Sulfa Antibiotics     Patient Measurements: Height: 5\' 9"  (175.3 cm) Weight: 233 lb 7.5 oz (105.9 kg) IBW/kg (Calculated) : 66.2    Vital Signs: Temp: 99.3 F (37.4 C) (02/11 0535) Temp src: Oral (02/11 0535) BP: 132/62 mmHg (02/11 0535) Pulse Rate: 93  (02/11 0535) Intake/Output from previous day: 02/10 0701 - 02/11 0700 In: 1940 [P.O.:740; I.V.:1200] Out: 450 [Urine:450] Intake/Output from this shift:    Labs:  Basename 04/19/11 0325 04/18/11 0335 04/17/11 1845  WBC 14.0* 9.6 11.8*  HGB 9.1* 9.3* 9.9*  PLT 207 200 213  LABCREA -- -- --  CREATININE 2.43* 1.92* 1.45*   Estimated Creatinine Clearance: 25.9 ml/min (by C-G formula based on Cr of 2.43). CrCl 98ml/min/1.73m2    Microbiology: Recent Results (from the past 720 hour(s))  CULTURE, BLOOD (ROUTINE X 2)     Status: Normal (Preliminary result)   Collection Time   04/17/11  6:45 PM      Component Value Range Status Comment   Specimen Description BLOOD RIGHT HAND   Final    Special Requests BOTTLES DRAWN AEROBIC AND ANAEROBIC 5 CC EACH   Final    Culture  Setup Time [id number]   Final    Culture     Final    Value:        BLOOD CULTURE RECEIVED NO GROWTH TO DATE CULTURE WILL BE HELD FOR 5 DAYS BEFORE ISSUING A FINAL NEGATIVE REPORT   Report Status PENDING   Incomplete   URINE CULTURE     Status: Normal (Preliminary result)   Collection Time   04/17/11  7:31 PM      Component Value Range Status Comment   Specimen Description URINE, CATHETERIZED   Final    Special Requests NONE   Final    Culture  Setup Time [id number]   Final    Colony Count 85,000 COLONIES/ML   Final    Culture GRAM NEGATIVE RODS   Final    Report Status PENDING   Incomplete   CULTURE, BLOOD (ROUTINE X 2)     Status: Normal (Preliminary result)   Collection Time   04/17/11   9:50 PM      Component Value Range Status Comment   Specimen Description BLOOD LEFT ARM   Final    Special Requests BOTTLES DRAWN AEROBIC AND ANAEROBIC 5CC   Final    Culture  Setup Time [id number]   Final    Culture     Final    Value: GRAM NEGATIVE RODS     Note: CRITICAL RESULT CALLED TO, READ BACK BY AND VERIFIED WITH: DEBRA ANDERSON @ 840PM BY VINCJ 04/18/11   Report Status PENDING   Incomplete   MRSA PCR SCREENING     Status: Normal   Collection Time   04/18/11 12:17 AM      Component Value Range Status Comment   MRSA by PCR NEGATIVE  NEGATIVE  Final     Medical History: Past Medical History  Diagnosis Date  . Osteoporosis   . DM (diabetes mellitus)   . HTN (hypertension)     unspecified  . Atrial fibrillation   . Macular degeneration   . Osteoarthritis   . Edema   . Anemia   . Hydronephrosis     left  . UTI (lower urinary  tract infection)     Medications:  Scheduled:    . darifenacin  7.5 mg Oral Daily  . ferrous sulfate  325 mg Oral Q breakfast  . imipenem-cilastatin  250 mg Intravenous Q6H  . insulin aspart  0-15 Units Subcutaneous TID WC  . insulin aspart  5 Units Subcutaneous TID WC  . insulin glargine  30 Units Subcutaneous QHS  . insulin glargine  30 Units Subcutaneous QHS  . losartan  100 mg Oral Daily  . metoprolol tartrate  25 mg Oral BID  . multivitamin  1 tablet Oral Daily  . pantoprazole  40 mg Oral Daily  . polyethylene glycol  17 g Oral Daily  . rivaroxaban  15 mg Oral Daily  . rosuvastatin  20 mg Oral Daily  . senna-docusate  2 tablet Oral QHS  . DISCONTD: cefTRIAXone (ROCEPHIN)  IV  1 g Intravenous QHS  . DISCONTD: insulin aspart  5 Units Subcutaneous TID WC   Infusions:    . sodium chloride 75 mL/hr at 04/19/11 0212   PRN: acetaminophen, HYDROcodone-acetaminophen, morphine, ondansetron (ZOFRAN) IV, ondansetron, zolpidem Assessment: 78 yo F admitted 2/9 w/ weakness, fevers, and chills. Was started on rocephin IV. Now to change to  Imipenem per pharmacy for GNR in one of 2 sets of blood cx 2/9. Repeat blood cx 2/11 pending. Ucx 2/9 = GNR. Tm 100.9. WBC elevated.  Goal of Therapy:  Primaxin dose appropriate for renal function  Plan:  Primaxin 250mg  IV q6h. Follow labs, vitals and cultures. Adjust dose as appropriate.   Gwen Her PharmD  [phone] 04/19/2011 10:52 AM

## 2011-04-19 NOTE — Progress Notes (Signed)
Subjective: Had a better night. No chllls or sweats. No abdominal pain or bladder pain. Eating OK constipated   Objective: Vital signs in last 24 hours: Temp:  [99.3 F (37.4 C)-100.9 F (38.3 C)] 99.3 F (37.4 C) (02/11 0535) Pulse Rate:  [84-93] 93  (02/11 0535) Resp:  [18-20] 20  (02/11 0535) BP: (132-153)/(62-82) 132/62 mmHg (02/11 0535) SpO2:  [93 %-96 %] 96 % (02/11 0535)  Intake/Output from previous day: 02/10 0701 - 02/11 0700 In: 1940 [P.O.:740; I.V.:1200] Out: 450 [Urine:450] Intake/Output this shift:    General: alert non toxic, lying flat without dyspnea. Oral membranes moist, neck supple. Lungs clear Ht IR/IR. abd obese NT no masses Extrems 1+ edema. Alert awake mentating well  Lab Results   Basename 04/19/11 0325 04/18/11 0335  WBC 14.0* 9.6  RBC 2.87* 2.94*  HGB 9.1* 9.3*  HCT 27.1* 27.9*  MCV 94.4 94.9  MCH 31.7 31.6  RDW 16.7* 16.5*  PLT 207 200    Basename 04/19/11 0325 04/18/11 0335  NA 129* 131*  K 4.4 4.9  CL 97 98  CO2 22 26  GLUCOSE 142* 288*  BUN 44* 34*  CREATININE 2.43* 1.92*  CALCIUM 8.7 9.1    Studies/Results: Dg Pneumonia Chest 2v  04/17/2011  *RADIOLOGY REPORT*  Clinical Data: Fever.  Rule out pneumonia  CHEST - 2 VIEW  Comparison: 02/17/2011  Findings: Negative for pneumonia.  Mild atelectasis in the right base.  Negative for heart failure or effusion.  IMPRESSION:   Mild right lower lobe atelectasis, negative for pneumonia.  Original Report Authenticated By: Camelia Phenes, M.D.    Scheduled Meds:   . cefTRIAXone (ROCEPHIN)  IV  1 g Intravenous QHS  . darifenacin  7.5 mg Oral Daily  . ferrous sulfate  325 mg Oral Q breakfast  . insulin aspart  0-15 Units Subcutaneous TID WC  . insulin aspart  5 Units Subcutaneous TID WC  . insulin glargine  30 Units Subcutaneous QHS  . insulin glargine  30 Units Subcutaneous QHS  . losartan  100 mg Oral Daily  . metoprolol tartrate  25 mg Oral BID  . multivitamin  1 tablet Oral Daily  .  pantoprazole  40 mg Oral Daily  . polyethylene glycol  17 g Oral Daily  . rivaroxaban  15 mg Oral Daily  . rosuvastatin  20 mg Oral Daily  . senna-docusate  2 tablet Oral QHS  . DISCONTD: insulin aspart  5 Units Subcutaneous TID WC   Continuous Infusions:   . sodium chloride 75 mL/hr at 04/19/11 0212   PRN Meds:acetaminophen, HYDROcodone-acetaminophen, morphine, ondansetron (ZOFRAN) IV, ondansetron, zolpidem  Assessment/Plan: PYELONEPHRITIS/SEPSIS FROM A URINARY SOURCE: Now with GNR in blood. On "Rocephin with WBC rising from 9 to 14. No fever. Tmax 100.9. May need additional Abx. Will ask ID to help RENAL INSUFFICIENCY: Worsening. Bladder scan was OK, check Renal US ATRIAL FIBRILLATION: Stable rate DM 2:  FBS OK at 142 HYPERTENSION: BP reasonable overall ANEMIA: About the same at 9.1 HYPONATREMIA: ? Due to nausea. Check again in AM  LOS: 2 days   Bryann Mcnealy [name] 04/19/2011, 8:01 AM

## 2011-04-20 DIAGNOSIS — E871 Hypo-osmolality and hyponatremia: Secondary | ICD-10-CM | POA: Diagnosis present

## 2011-04-20 DIAGNOSIS — N390 Urinary tract infection, site not specified: Secondary | ICD-10-CM

## 2011-04-20 DIAGNOSIS — B9689 Other specified bacterial agents as the cause of diseases classified elsewhere: Secondary | ICD-10-CM

## 2011-04-20 DIAGNOSIS — E669 Obesity, unspecified: Secondary | ICD-10-CM | POA: Diagnosis present

## 2011-04-20 DIAGNOSIS — R7881 Bacteremia: Secondary | ICD-10-CM | POA: Diagnosis present

## 2011-04-20 DIAGNOSIS — N289 Disorder of kidney and ureter, unspecified: Secondary | ICD-10-CM | POA: Diagnosis present

## 2011-04-20 LAB — CULTURE, BLOOD (ROUTINE X 2): Culture  Setup Time: 201302100235

## 2011-04-20 LAB — CBC
HCT: 26.9 % — ABNORMAL LOW (ref 36.0–46.0)
Hemoglobin: 9.3 g/dL — ABNORMAL LOW (ref 12.0–15.0)
MCH: 32.3 pg (ref 26.0–34.0)
MCHC: 34.6 g/dL (ref 30.0–36.0)
RBC: 2.88 MIL/uL — ABNORMAL LOW (ref 3.87–5.11)

## 2011-04-20 LAB — COMPREHENSIVE METABOLIC PANEL
Alkaline Phosphatase: 156 U/L — ABNORMAL HIGH (ref 39–117)
BUN: 39 mg/dL — ABNORMAL HIGH (ref 6–23)
CO2: 20 mEq/L (ref 19–32)
Calcium: 8.5 mg/dL (ref 8.4–10.5)
GFR calc Af Amer: 29 mL/min — ABNORMAL LOW (ref >90)
GFR calc non Af Amer: 25 mL/min — ABNORMAL LOW (ref >90)
Glucose, Bld: 88 mg/dL (ref 70–99)
Potassium: 4.2 mEq/L (ref 3.5–5.1)
Total Protein: 5.5 g/dL — ABNORMAL LOW (ref 6.0–8.3)

## 2011-04-20 LAB — GLUCOSE, CAPILLARY
Glucose-Capillary: 82 mg/dL (ref 70–99)
Glucose-Capillary: 117 mg/dL — ABNORMAL HIGH (ref 70–99)

## 2011-04-20 MED ORDER — MENTHOL 3 MG MT LOZG
1.0000 | LOZENGE | OROMUCOSAL | Status: DC | PRN
  Administered 2011-04-20 – 2011-04-21 (×2): 3 mg via ORAL
  Filled 2011-04-20: qty 9

## 2011-04-20 MED ORDER — DEXTROSE 5 % IV SOLN
1.0000 g | INTRAVENOUS | Status: DC
  Administered 2011-04-20 – 2011-05-03 (×14): 1 g via INTRAVENOUS
  Filled 2011-04-20 (×14): qty 10

## 2011-04-20 MED ORDER — MAGIC MOUTHWASH
5.0000 mL | Freq: Four times a day (QID) | ORAL | Status: DC | PRN
  Administered 2011-04-20: 5 mL via ORAL
  Filled 2011-04-20: qty 5

## 2011-04-20 NOTE — Progress Notes (Signed)
Subjective: Some abdominal pain overnight. Better at the moment. No chills or sweats. Had some vomiting earlier.   Objective: Vital signs in last 24 hours: Temp:  [97.5 F (36.4 C)-99.8 F (37.7 C)] 97.5 F (36.4 C) (02/12 0435) Pulse Rate:  [82-98] 82  (02/12 0435) Resp:  [18-20] 20  (02/12 0435) BP: (121-150)/(66-78) 150/66 mmHg (02/12 0435) SpO2:  [91 %-95 %] 94 % (02/12 0435)  Intake/Output from previous day: 02/11 0701 - 02/12 0700 In: 360 [P.O.:360] Out: 800 [Urine:800] Intake/Output this shift:    General: fatigued, non toxic, anicteric, oral membranes moist. Lungs clear. Ht IR/IR with murmur. Abdomen obese soft Nontender with hyperactive BS's. Alert awake, mentating well, no tremor  Lab Results   Basename 04/20/11 0320 04/19/11 0325  WBC 11.2* 14.0*  RBC 2.88* 2.87*  HGB 9.3* 9.1*  HCT 26.9* 27.1*  MCV 93.4 94.4  MCH 32.3 31.7  RDW 16.6* 16.7*  PLT 171 207    Basename 04/20/11 0320 04/19/11 0325  NA 128* 129*  K 4.2 4.4  CL 98 97  CO2 20 22  GLUCOSE 88 142*  BUN 39* 44*  CREATININE 1.89* 2.43*  CALCIUM 8.5 8.7    Studies/Results: Ct Abdomen Pelvis Wo Contrast  04/19/2011  *RADIOLOGY REPORT*  Clinical Data:  Nausea.  Vomiting.  Fatigue.  Hydronephrosis. Question stone.  Prior cholecystectomy.  CT ABDOMEN AND PELVIS WITHOUT CONTRAST (CT UROGRAM)  Technique: Contiguous axial images of the abdomen and pelvis without oral or intravenous contrast were obtained.  Comparison: Ultrasound of 04/19/2011 and 10/11/2010.  Prior CT of 02/13/2010.  Findings:  Exam is limited for evaluation of entities other than urinary tract calculi due to lack of oral or intravenous contrast.   Motion degraded evaluation of the lung bases/inferior chest. Cardiomegaly with advanced multivessel coronary artery atherosclerosis.  Trace bilateral pleural effusions.  Possible calcified lesion in the right breast, incompletely imaged on image 1 of series 2.  Minimal exclusion of the liver.   Normal spleen, stomach, pancreas. Cholecystectomy.  No biliary ductal dilatation.  Normal right adrenal gland.  Mild left adrenal thickening without well-defined mass.  Bilateral moderate perirenal edema.  Bilateral renal cortical thinning.  Moderate left-sided hydroureter throughout.  Mild right- sided hydroureter.  No evidence of ureteric stone.  Both ureters are followed to the level of the bladder, which is not distended.  No retroperitoneal or retrocrural adenopathy.  Colonic diverticulosis.  Normal terminal ileum.  Small bowel loops are normal in caliber.  There is a lower abdominal upper pelvic extraperitoneal/retroperitoneal edema which is nonspecific.  Foci of gas are seen along the course of the right ureter, including on images 55 - 58 of series 2. These areas are poorly evaluated, secondary to lack of contrast.  The appendix is not identified, and by report is surgically absent.  No pelvic adenopathy.  The bladder is collapsed around a Foley catheter.  Normal uterus, without adnexal mass or significant free pelvic fluid.  Diffuse anasarca. No acute osseous abnormality.  IMPRESSION:  1.  Left greater than right hydroureter.  No evidence of obstructive stone.  This is followed to the level of the bladder, which is collapsed around a Foley catheter.  Cannot exclude debris, hemorrhage, or less likely neoplasm within the bladder causing hydroureter. Cystoscopy or repeat imaging after removal of Foley suggested. 2.  Degraded exam, secondary patient size and lack of oral / IV contrast. 3.  Diffuse retroperitoneal edema with air along the course of the right ureter. Adjacent bowel loops are  poorly evaluated secondary to lack of enteric contrast ( i.e. this air could be within bowel loops).  Especially if there are abdominal symptoms to suggest extraluminal bowel air or ureteric injury, further evaluation with contrast enhanced (preferably oral and IV) CT should be considered. The study was made a "call report."  4.  Small bilateral pleural effusions. 5.  Possible calcified lesion within the right breast, indeterminate and incompletely imaged.  Original Report Authenticated By: Consuello Bossier, M.D.   US Renal  04/19/2011  *RADIOLOGY REPORT*  Clinical Data: Worsening renal function and urinary tract infection.  RENAL/URINARY TRACT ULTRASOUND COMPLETE  Comparison:  Renal ultrasound 10/11/2010 and abdominal CT 02/13/2010  Findings:  Right Kidney:  Right kidney measures 11.1 cm in length without hydronephrosis. Normal appearance of the right renal parenchyma. There is mild fullness of the right renal pelvis which could represent an extrarenal pelvis.  Left Kidney:  Left kidney measures 12.4 cm in length with fullness of the left renal collecting system and left renal pelvis. Findings are compatible with hydronephrosis and increased since the exam on 10/11/2010.  Bladder:  There is a Foley catheter within the urinary bladder and the bladder appears to be decompressed.  IMPRESSION: Moderate left hydronephrosis.  The degree of left hydronephrosis has increased since the exam on 10/11/2010.  A ureteral obstruction cannot be excluded and the patient may benefit from a CT of the abdomen and pelvis for further characterization.  Original Report Authenticated By: Richarda Overlie, M.D.    Scheduled Meds:   . darifenacin  7.5 mg Oral Daily  . ferrous sulfate  325 mg Oral Q breakfast  . imipenem-cilastatin  250 mg Intravenous Q6H  . insulin aspart  0-15 Units Subcutaneous TID WC  . insulin aspart  5 Units Subcutaneous TID WC  . insulin glargine  30 Units Subcutaneous QHS  . losartan  100 mg Oral Daily  . metoprolol tartrate  25 mg Oral BID  . multivitamin  1 tablet Oral Daily  . pantoprazole  40 mg Oral Daily  . polyethylene glycol  17 g Oral Daily  . rivaroxaban  15 mg Oral Daily  . rosuvastatin  20 mg Oral Daily  . senna-docusate  2 tablet Oral QHS  . DISCONTD: cefTRIAXone (ROCEPHIN)  IV  1 g Intravenous QHS  . DISCONTD:  insulin glargine  30 Units Subcutaneous QHS  . DISCONTD: polyethylene glycol  17 g Oral Daily   Continuous Infusions:   . sodium chloride 75 mL/hr at 04/20/11 0551   PRN Meds:acetaminophen, HYDROcodone-acetaminophen, morphine, ondansetron (ZOFRAN) IV, ondansetron, zolpidem  Assessment/Plan:  PYELONEPHRITIS/SEPSIS FROM A URINARY SOURCE: Now with GNR in blood. Now on Primaxin WBC sl better at 11K. No fever of note. May need additional Abx. ID hasn't seen yet ABDOMINAL PAIN/VOMITING: No obstruction seen on limited CT RENAL INSUFFICIENCY: sl better today at 1.89 HYDROURETER: Seen this AM by urology. Air seen outside the ureter. ? Some disruption. Await their note. ATRIAL FIBRILLATION: Stable rate  DM 2: FBS better at 95 HYPERTENSION: BP reasonable overall  ANEMIA: About the same at 9.3 HYPONATREMIA: ? Due to nausea. Still on the low side at 128   LOS: 3 days   Kathryn [name] [name] 04/20/2011, 8:13 AM

## 2011-04-20 NOTE — Progress Notes (Signed)
Brief Antibiotic note:  Urine cx: 2/9: Klebsiells pneumonia Blood cx 2/9: 1/2 with K pneumo Rpt Blooc cx 2/11: no growth to date  K pneumo: Ampicillin resistant Sensitive to Cefazolin, Ceftriaxone,Quinolones,Zosyn,Sulfa (noted allergy to sulfa-no rx specified)  On primaxin 250mg  IV q6h, dose and schedule appropriate for renal fx (improving).  Based on Cx results: could change to Rocephin, Zosyn or Quinolone for treatment.  Otho Bellows PharmD 8:56 AM

## 2011-04-20 NOTE — Consult Note (Signed)
Urology Consult  Referring physician: Evlyn Kanner, MD Reason for referral:Acute cystitis, acute pyelonephritis and hydronephrosis.  History of Present Illness: Kathryn [name] is well known to me over the last several Years.She was initially assessed approximately 2 years ago secondary to gross hematuria and chronic cystitis. She has a host of medical problems including long-standing diabetes mellitus, hypertension, atrial fibrillation and obesity. Her past several years she has had a multitude of imaging studies, cystoscopy and urine cytologies which have failed to identify any more significant pathology. She last underwent a contrast CT in December of 2011 which showed no worrisome findings.  The patient was admitted approximately 2 days ago with weakness fever and chills. She reports to me that she developed nonspecific diffuse lower abdominal discomfort. The patient's white blood cell count was noted to be elevated at 11.8 thousand. In addition her creatinine was increased to 1.9 from a baseline of approximately 1.3. The patient's urinalysis was adamant and urine culture is demonstrating growth with Klebsiella pneumonia. The patient also has positive blood cultures for gram-negative rods. Ultrasound was performed which showed moderate left hydronephrosis. This is new relative to imaging studies in our office. The patient has persisted in having some intermittent gross hematuria and has a chronic anemia with a hemoglobin that tends to be in the 9-10 range. I did discuss her case with Dr. Evlyn Kanner last evening and recommended a noncontrast CT to rule out a ureteral calculus. This failed to reveal any evidence of ureteral or renal calculi. She did however have evidence of bilateral hydronephrosis left greater than right. No obvious obstructing lesion was noted. The study was limited by echo and IV contrast. The patient was noted to have diffuse retroperitoneal edema with some questionable around the right ureter versus  possible bowel gas. I reviewed the films and also discussed the case with Dr. Reche Dixon. In the interim she is clinically improved. Her pain was better and she is currently afebrile. Her creatinine peaked at 2.4 and is now returned to 1.9.  Past Medical History  Diagnosis Date  . Osteoporosis   . DM (diabetes mellitus)   . HTN (hypertension)     unspecified  . Atrial fibrillation   . Macular degeneration   . Osteoarthritis   . Edema   . Anemia   . Hydronephrosis     left  . UTI (lower urinary tract infection)    Past Surgical History  Procedure Date  . Replacement total knee 08/2007    bilateral  . Appendectomy   . Cholecystectomy   . Colonoscopy   . Upper gastrointestinal endoscopy     Medications:  Scheduled:   . cefTRIAXone (ROCEPHIN)  IV  1 g Intravenous Q24H  . darifenacin  7.5 mg Oral Daily  . ferrous sulfate  325 mg Oral Q breakfast  . insulin aspart  0-15 Units Subcutaneous TID WC  . insulin aspart  5 Units Subcutaneous TID WC  . insulin glargine  30 Units Subcutaneous QHS  . losartan  100 mg Oral Daily  . metoprolol tartrate  25 mg Oral BID  . multivitamin  1 tablet Oral Daily  . pantoprazole  40 mg Oral Daily  . polyethylene glycol  17 g Oral Daily  . rivaroxaban  15 mg Oral Daily  . rosuvastatin  20 mg Oral Daily  . senna-docusate  2 tablet Oral QHS  . DISCONTD: imipenem-cilastatin  250 mg Intravenous Q6H  . DISCONTD: insulin glargine  30 Units Subcutaneous QHS    Allergies:  Allergies  Allergen Reactions  . Celebrex (Celecoxib)   . Nsaids   . Sulfa Antibiotics     Family History  Problem Relation Age of Onset  . Diabetes Father   . Atrial fibrillation Father   . Breast cancer Paternal Grandmother   . Heart disease Mother     Social History:  reports that she has never smoked. She has never used smokeless tobacco. She reports that she does not drink alcohol or use illicit drugs.  ROS: Positive for abdominal pain, nausea, fatigue, fever and  chills, gross hematuria and general weakness. Otherwise negative.  Physical Exam:  Vital signs in last 24 hours: Temp:  [97.5 F (36.4 C)-99.8 F (37.7 C)] 98.7 F (37.1 C) (02/12 1432) Pulse Rate:  [82-98] 90  (02/12 1432) Resp:  [18-20] 18  (02/12 1432) BP: (137-155)/(66-82) 147/78 mmHg (02/12 1432) SpO2:  [91 %-94 %] 94 % (02/12 1432)  Constitutional: Vital signs reviewed. WD WN in NAD Head: Normocephalic and atraumatic   Eyes: PERRL, No scleral icterus.  Neck: Supple No  Gross JVD, mass, thyromegaly, or carotid bruit present.  Cardiovascular: Irregularly irregular Pulmonary/Chest: Normal effort Abdominal: Soft. Mild/moderate diffuse tenderness In both lower quadrants. No obvious peritoneal signs.  Genitourinary:not done Extremities: No cyanosis or edema  Neurological: Grossly non-focal.  Skin: Warm,very dry and intact. No rash, cyanosis   Laboratory Data:  Results for orders placed during the hospital encounter of 04/17/11 (from the past 72 hour(s))  CBC     Status: Abnormal   Collection Time   04/17/11  6:45 PM      Component Value Range Comment   WBC 11.8 (*) 4.0 - 10.5 (K/uL)    RBC 3.09 (*) 3.87 - 5.11 (MIL/uL)    Hemoglobin 9.9 (*) 12.0 - 15.0 (g/dL)    HCT 40.9 (*) 81.1 - 46.0 (%)    MCV 93.5  78.0 - 100.0 (fL)    MCH 32.0  26.0 - 34.0 (pg)    MCHC 34.3  30.0 - 36.0 (g/dL)    RDW 91.4 (*) 78.2 - 15.5 (%)    Platelets 213  150 - 400 (K/uL)   DIFFERENTIAL     Status: Abnormal   Collection Time   04/17/11  6:45 PM      Component Value Range Comment   Neutrophils Relative 92 (*) 43 - 77 (%)    Neutro Abs 10.9 (*) 1.7 - 7.7 (K/uL)    Lymphocytes Relative 2 (*) 12 - 46 (%)    Lymphs Abs 0.2 (*) 0.7 - 4.0 (K/uL)    Monocytes Relative 6  3 - 12 (%)    Monocytes Absolute 0.7  0.1 - 1.0 (K/uL)    Eosinophils Relative 0  0 - 5 (%)    Eosinophils Absolute 0.0  0.0 - 0.7 (K/uL)    Basophils Relative 0  0 - 1 (%)    Basophils Absolute 0.0  0.0 - 0.1 (K/uL)   BASIC  METABOLIC PANEL     Status: Abnormal   Collection Time   04/17/11  6:45 PM      Component Value Range Comment   Sodium 133 (*) 135 - 145 (mEq/L)    Potassium 3.9  3.5 - 5.1 (mEq/L)    Chloride 98  96 - 112 (mEq/L)    CO2 24  19 - 32 (mEq/L)    Glucose, Bld 178 (*) 70 - 99 (mg/dL)    BUN 29 (*) 6 - 23 (mg/dL)    Creatinine, Ser 9.56 (*)  0.50 - 1.10 (mg/dL)    Calcium 9.6  8.4 - 10.5 (mg/dL)    GFR calc non Af Amer 34 (*) >90 (mL/min)    GFR calc Af Amer 40 (*) >90 (mL/min)   CULTURE, BLOOD (ROUTINE X 2)     Status: Normal (Preliminary result)   Collection Time   04/17/11  6:45 PM      Component Value Range Comment   Specimen Description BLOOD RIGHT HAND      Special Requests BOTTLES DRAWN AEROBIC AND ANAEROBIC 5 CC EACH      Culture  Setup Time [id number]      Culture        Value:        BLOOD CULTURE RECEIVED NO GROWTH TO DATE CULTURE WILL BE HELD FOR 5 DAYS BEFORE ISSUING A FINAL NEGATIVE REPORT   Report Status PENDING     URINALYSIS, ROUTINE W REFLEX MICROSCOPIC     Status: Abnormal   Collection Time   04/17/11  7:31 PM      Component Value Range Comment   Color, Urine RED (*) YELLOW  BIOCHEMICALS MAY BE AFFECTED BY COLOR   APPearance TURBID (*) CLEAR     Specific Gravity, Urine 1.014  1.005 - 1.030     pH 6.5  5.0 - 8.0     Glucose, UA NEGATIVE  NEGATIVE (mg/dL)    Hgb urine dipstick LARGE (*) NEGATIVE     Bilirubin Urine NEGATIVE  NEGATIVE     Ketones, ur TRACE (*) NEGATIVE (mg/dL)    Protein, ur >811 (*) NEGATIVE (mg/dL)    Urobilinogen, UA 0.2  0.0 - 1.0 (mg/dL)    Nitrite POSITIVE (*) NEGATIVE     Leukocytes, UA LARGE (*) NEGATIVE    URINE CULTURE     Status: Normal   Collection Time   04/17/11  7:31 PM      Component Value Range Comment   Specimen Description URINE, CATHETERIZED      Special Requests NONE      Culture  Setup Time [id number]      Colony Count 85,000 COLONIES/ML      Culture KLEBSIELLA PNEUMONIAE      Report Status 04/19/2011 FINAL      Organism  ID, Bacteria KLEBSIELLA PNEUMONIAE     URINE MICROSCOPIC-ADD ON     Status: Abnormal   Collection Time   04/17/11  7:31 PM      Component Value Range Comment   WBC, UA TOO NUMEROUS TO COUNT  <3 (WBC/hpf)    RBC / HPF TOO NUMEROUS TO COUNT  <3 (RBC/hpf)    Bacteria, UA FEW (*) RARE     Urine-Other FIELD OBSCURED BY RBC'S     LACTIC ACID, PLASMA     Status: Normal   Collection Time   04/17/11  7:59 PM      Component Value Range Comment   Lactic Acid, Venous 1.3  0.5 - 2.2 (mmol/L)   INFLUENZA PANEL BY PCR     Status: Normal   Collection Time   04/17/11  9:50 PM      Component Value Range Comment   Influenza A By PCR NEGATIVE  NEGATIVE     Influenza B By PCR NEGATIVE  NEGATIVE     H1N1 flu by pcr NOT DETECTED  NOT DETECTED    CULTURE, BLOOD (ROUTINE X 2)     Status: Normal   Collection Time   04/17/11  9:50 PM      Component  Value Range Comment   Specimen Description BLOOD LEFT ARM      Special Requests BOTTLES DRAWN AEROBIC AND ANAEROBIC 5CC      Culture  Setup Time [id number]      Culture        Value: KLEBSIELLA PNEUMONIAE     Note: CRITICAL RESULT CALLED TO, READ BACK BY AND VERIFIED WITH: DEBRA ANDERSON @ 840PM BY VINCJ 04/18/11   Report Status 04/20/2011 FINAL      Organism ID, Bacteria KLEBSIELLA PNEUMONIAE     MRSA PCR SCREENING     Status: Normal   Collection Time   04/18/11 12:17 AM      Component Value Range Comment   MRSA by PCR NEGATIVE  NEGATIVE    CBC     Status: Abnormal   Collection Time   04/18/11  3:35 AM      Component Value Range Comment   WBC 9.6  4.0 - 10.5 (K/uL)    RBC 2.94 (*) 3.87 - 5.11 (MIL/uL)    Hemoglobin 9.3 (*) 12.0 - 15.0 (g/dL)    HCT 10.2 (*) 72.5 - 46.0 (%)    MCV 94.9  78.0 - 100.0 (fL)    MCH 31.6  26.0 - 34.0 (pg)    MCHC 33.3  30.0 - 36.0 (g/dL)    RDW 36.6 (*) 44.0 - 15.5 (%)    Platelets 200  150 - 400 (K/uL)   COMPREHENSIVE METABOLIC PANEL     Status: Abnormal   Collection Time   04/18/11  3:35 AM      Component Value Range  Comment   Sodium 131 (*) 135 - 145 (mEq/L)    Potassium 4.9  3.5 - 5.1 (mEq/L)    Chloride 98  96 - 112 (mEq/L)    CO2 26  19 - 32 (mEq/L)    Glucose, Bld 288 (*) 70 - 99 (mg/dL)    BUN 34 (*) 6 - 23 (mg/dL)    Creatinine, Ser 3.47 (*) 0.50 - 1.10 (mg/dL)    Calcium 9.1  8.4 - 10.5 (mg/dL)    Total Protein 5.9 (*) 6.0 - 8.3 (g/dL)    Albumin 2.4 (*) 3.5 - 5.2 (g/dL)    AST 35  0 - 37 (U/L)    ALT 27  0 - 35 (U/L)    Alkaline Phosphatase 107  39 - 117 (U/L)    Total Bilirubin 0.5  0.3 - 1.2 (mg/dL)    GFR calc non Af Amer 24 (*) >90 (mL/min)    GFR calc Af Amer 28 (*) >90 (mL/min)   GLUCOSE, CAPILLARY     Status: Abnormal   Collection Time   04/18/11  8:24 AM      Component Value Range Comment   Glucose-Capillary 237 (*) 70 - 99 (mg/dL)    Comment 1 Documented in Chart      Comment 2 Notify RN     GLUCOSE, CAPILLARY     Status: Abnormal   Collection Time   04/18/11 11:57 AM      Component Value Range Comment   Glucose-Capillary 225 (*) 70 - 99 (mg/dL)    Comment 1 Documented in Chart      Comment 2 Notify RN     GLUCOSE, CAPILLARY     Status: Abnormal   Collection Time   04/18/11  4:15 PM      Component Value Range Comment   Glucose-Capillary 184 (*) 70 - 99 (mg/dL)    Comment 1 Documented in  Chart      Comment 2 Notify RN     GLUCOSE, CAPILLARY     Status: Abnormal   Collection Time   04/18/11  9:20 PM      Component Value Range Comment   Glucose-Capillary 150 (*) 70 - 99 (mg/dL)    Comment 1 Notify RN     CBC     Status: Abnormal   Collection Time   04/19/11  3:25 AM      Component Value Range Comment   WBC 14.0 (*) 4.0 - 10.5 (K/uL)    RBC 2.87 (*) 3.87 - 5.11 (MIL/uL)    Hemoglobin 9.1 (*) 12.0 - 15.0 (g/dL)    HCT 16.1 (*) 09.6 - 46.0 (%)    MCV 94.4  78.0 - 100.0 (fL)    MCH 31.7  26.0 - 34.0 (pg)    MCHC 33.6  30.0 - 36.0 (g/dL)    RDW 04.5 (*) 40.9 - 15.5 (%)    Platelets 207  150 - 400 (K/uL)   COMPREHENSIVE METABOLIC PANEL     Status: Abnormal    Collection Time   04/19/11  3:25 AM      Component Value Range Comment   Sodium 129 (*) 135 - 145 (mEq/L)    Potassium 4.4  3.5 - 5.1 (mEq/L)    Chloride 97  96 - 112 (mEq/L)    CO2 22  19 - 32 (mEq/L)    Glucose, Bld 142 (*) 70 - 99 (mg/dL)    BUN 44 (*) 6 - 23 (mg/dL)    Creatinine, Ser 8.11 (*) 0.50 - 1.10 (mg/dL)    Calcium 8.7  8.4 - 10.5 (mg/dL)    Total Protein 5.5 (*) 6.0 - 8.3 (g/dL)    Albumin 2.1 (*) 3.5 - 5.2 (g/dL)    AST 27  0 - 37 (U/L)    ALT 23  0 - 35 (U/L)    Alkaline Phosphatase 97  39 - 117 (U/L)    Total Bilirubin 0.3  0.3 - 1.2 (mg/dL)    GFR calc non Af Amer 18 (*) >90 (mL/min)    GFR calc Af Amer 21 (*) >90 (mL/min)   GLUCOSE, CAPILLARY     Status: Abnormal   Collection Time   04/19/11  8:09 AM      Component Value Range Comment   Glucose-Capillary 102 (*) 70 - 99 (mg/dL)    Comment 1 Documented in Chart      Comment 2 Notify RN     CULTURE, BLOOD (ROUTINE X 2)     Status: Normal (Preliminary result)   Collection Time   04/19/11  9:50 AM      Component Value Range Comment   Specimen Description BLOOD LEFT HAND      Special Requests BOTTLES DRAWN AEROBIC AND ANAEROBIC 10CC      Culture  Setup Time [id number]      Culture        Value:        BLOOD CULTURE RECEIVED NO GROWTH TO DATE CULTURE WILL BE HELD FOR 5 DAYS BEFORE ISSUING A FINAL NEGATIVE REPORT   Report Status PENDING     CULTURE, BLOOD (ROUTINE X 2)     Status: Normal (Preliminary result)   Collection Time   04/19/11 10:00 AM      Component Value Range Comment   Specimen Description BLOOD LEFT ARM      Special Requests BOTTLES DRAWN AEROBIC AND ANAEROBIC 10CC  Culture  Setup Time [id number]      Culture        Value:        BLOOD CULTURE RECEIVED NO GROWTH TO DATE CULTURE WILL BE HELD FOR 5 DAYS BEFORE ISSUING A FINAL NEGATIVE REPORT   Report Status PENDING     GLUCOSE, CAPILLARY     Status: Abnormal   Collection Time   04/19/11 12:20 PM      Component Value Range Comment    Glucose-Capillary 137 (*) 70 - 99 (mg/dL)    Comment 1 Documented in Chart      Comment 2 Notify RN     GLUCOSE, CAPILLARY     Status: Abnormal   Collection Time   04/19/11  5:20 PM      Component Value Range Comment   Glucose-Capillary 105 (*) 70 - 99 (mg/dL)    Comment 1 Notify RN     GLUCOSE, CAPILLARY     Status: Abnormal   Collection Time   04/19/11  9:12 PM      Component Value Range Comment   Glucose-Capillary 133 (*) 70 - 99 (mg/dL)   CBC     Status: Abnormal   Collection Time   04/20/11  3:20 AM      Component Value Range Comment   WBC 11.2 (*) 4.0 - 10.5 (K/uL)    RBC 2.88 (*) 3.87 - 5.11 (MIL/uL)    Hemoglobin 9.3 (*) 12.0 - 15.0 (g/dL)    HCT 46.9 (*) 62.9 - 46.0 (%)    MCV 93.4  78.0 - 100.0 (fL)    MCH 32.3  26.0 - 34.0 (pg)    MCHC 34.6  30.0 - 36.0 (g/dL)    RDW 52.8 (*) 41.3 - 15.5 (%)    Platelets 171  150 - 400 (K/uL)   COMPREHENSIVE METABOLIC PANEL     Status: Abnormal   Collection Time   04/20/11  3:20 AM      Component Value Range Comment   Sodium 128 (*) 135 - 145 (mEq/L)    Potassium 4.2  3.5 - 5.1 (mEq/L)    Chloride 98  96 - 112 (mEq/L)    CO2 20  19 - 32 (mEq/L)    Glucose, Bld 88  70 - 99 (mg/dL)    BUN 39 (*) 6 - 23 (mg/dL)    Creatinine, Ser 2.44 (*) 0.50 - 1.10 (mg/dL)    Calcium 8.5  8.4 - 10.5 (mg/dL)    Total Protein 5.5 (*) 6.0 - 8.3 (g/dL)    Albumin 1.9 (*) 3.5 - 5.2 (g/dL)    AST 37  0 - 37 (U/L) SLIGHT HEMOLYSIS   ALT 23  0 - 35 (U/L)    Alkaline Phosphatase 156 (*) 39 - 117 (U/L)    Total Bilirubin 0.3  0.3 - 1.2 (mg/dL)    GFR calc non Af Amer 25 (*) >90 (mL/min)    GFR calc Af Amer 29 (*) >90 (mL/min)   GLUCOSE, CAPILLARY     Status: Normal   Collection Time   04/20/11  7:41 AM      Component Value Range Comment   Glucose-Capillary 82  70 - 99 (mg/dL)    Comment 1 Documented in Chart      Comment 2 Notify RN     GLUCOSE, CAPILLARY     Status: Abnormal   Collection Time   04/20/11 12:19 PM      Component Value Range Comment     Glucose-Capillary  117 (*) 70 - 99 (mg/dL)    Comment 1 Documented in Chart      Comment 2 Notify RN      Recent Results (from the past 240 hour(s))  CULTURE, BLOOD (ROUTINE X 2)     Status: Normal (Preliminary result)   Collection Time   04/17/11  6:45 PM      Component Value Range Status Comment   Specimen Description BLOOD RIGHT HAND   Final    Special Requests BOTTLES DRAWN AEROBIC AND ANAEROBIC 5 CC EACH   Final    Culture  Setup Time [id number]   Final    Culture     Final    Value:        BLOOD CULTURE RECEIVED NO GROWTH TO DATE CULTURE WILL BE HELD FOR 5 DAYS BEFORE ISSUING A FINAL NEGATIVE REPORT   Report Status PENDING   Incomplete   URINE CULTURE     Status: Normal   Collection Time   04/17/11  7:31 PM      Component Value Range Status Comment   Specimen Description URINE, CATHETERIZED   Final    Special Requests NONE   Final    Culture  Setup Time [id number]   Final    Colony Count 85,000 COLONIES/ML   Final    Culture KLEBSIELLA PNEUMONIAE   Final    Report Status 04/19/2011 FINAL   Final    Organism ID, Bacteria KLEBSIELLA PNEUMONIAE   Final   CULTURE, BLOOD (ROUTINE X 2)     Status: Normal   Collection Time   04/17/11  9:50 PM      Component Value Range Status Comment   Specimen Description BLOOD LEFT ARM   Final    Special Requests BOTTLES DRAWN AEROBIC AND ANAEROBIC 5CC   Final    Culture  Setup Time [id number]   Final    Culture     Final    Value: KLEBSIELLA PNEUMONIAE     Note: CRITICAL RESULT CALLED TO, READ BACK BY AND VERIFIED WITH: DEBRA ANDERSON @ 840PM BY VINCJ 04/18/11   Report Status 04/20/2011 FINAL   Final    Organism ID, Bacteria KLEBSIELLA PNEUMONIAE   Final   MRSA PCR SCREENING     Status: Normal   Collection Time   04/18/11 12:17 AM      Component Value Range Status Comment   MRSA by PCR NEGATIVE  NEGATIVE  Final   CULTURE, BLOOD (ROUTINE X 2)     Status: Normal (Preliminary result)   Collection Time   04/19/11  9:50 AM      Component  Value Range Status Comment   Specimen Description BLOOD LEFT HAND   Final    Special Requests BOTTLES DRAWN AEROBIC AND ANAEROBIC 10CC   Final    Culture  Setup Time [id number]   Final    Culture     Final    Value:        BLOOD CULTURE RECEIVED NO GROWTH TO DATE CULTURE WILL BE HELD FOR 5 DAYS BEFORE ISSUING A FINAL NEGATIVE REPORT   Report Status PENDING   Incomplete   CULTURE, BLOOD (ROUTINE X 2)     Status: Normal (Preliminary result)   Collection Time   04/19/11 10:00 AM      Component Value Range Status Comment   Specimen Description BLOOD LEFT ARM   Final    Special Requests BOTTLES DRAWN AEROBIC AND ANAEROBIC 10CC   Final    Culture  Setup Time [id number]   Final    Culture     Final    Value:        BLOOD CULTURE RECEIVED NO GROWTH TO DATE CULTURE WILL BE HELD FOR 5 DAYS BEFORE ISSUING A FINAL NEGATIVE REPORT   Report Status PENDING   Incomplete    Creatinine:  Basename 04/20/11 0320 04/19/11 0325 04/18/11 0335 04/17/11 1845  CREATININE 1.89* 2.43* 1.92* 1.45*   Baseline Creatinine: 1.3  Impression/Assessment:  The patient was admitted with fever chills, diffuse lower abdominal pain and and blood cultures positive for gram-negative rods. This is certainly consistent with febrile urinary tract infection/bilateral pyelonephritis. I do not currently have a good explanation for her bilateral hydronephrosis. I suspect potentially some distal ureteral obstruction due to bladder wall thickening and edema. Her renal function will continue to improve and we may be able to get a CT scan with intravenous contrast. If not retrograde pyelography with potential stent placement may be necessary. I am concerned about this finding of potential air within the retroperitoneum. This is unlikely to be related to a urologic process but the patient could have a limited bowel perforation. I have reviewed the x-rays and also discussed the case with Dr. Reche Dixon. It is hard to determine if he is seen in  the retroperitoneum it was within the bowel lumen or not. Will obtain a repeat abdominal and pelvic CT scan with enteric contrast only. If her clinical situation deteriorates general surgery consultation may be indicated.  Plan:  As above.  Ady Heimann S 04/20/2011, 4:34 PM

## 2011-04-20 NOTE — Progress Notes (Signed)
INITIAL ADULT NUTRITION ASSESSMENT Date: 04/20/2011   Time: 1:48 PM Reason for Assessment: consult  ASSESSMENT: Female 78 y.o.  Dx: Pyelonephritis, acute  Hx:  Past Medical History  Diagnosis Date  . Osteoporosis   . DM (diabetes mellitus)   . HTN (hypertension)     unspecified  . Atrial fibrillation   . Macular degeneration   . Osteoarthritis   . Edema   . Anemia   . Hydronephrosis     left  . UTI (lower urinary tract infection)    Past Surgical History  Procedure Date  . Replacement total knee 08/2007    bilateral  . Appendectomy   . Cholecystectomy   . Colonoscopy   . Upper gastrointestinal endoscopy     Related Meds:  Scheduled Meds:   . cefTRIAXone (ROCEPHIN)  IV  1 g Intravenous Q24H  . darifenacin  7.5 mg Oral Daily  . ferrous sulfate  325 mg Oral Q breakfast  . insulin aspart  0-15 Units Subcutaneous TID WC  . insulin aspart  5 Units Subcutaneous TID WC  . insulin glargine  30 Units Subcutaneous QHS  . losartan  100 mg Oral Daily  . metoprolol tartrate  25 mg Oral BID  . multivitamin  1 tablet Oral Daily  . pantoprazole  40 mg Oral Daily  . polyethylene glycol  17 g Oral Daily  . rivaroxaban  15 mg Oral Daily  . rosuvastatin  20 mg Oral Daily  . senna-docusate  2 tablet Oral QHS  . DISCONTD: imipenem-cilastatin  250 mg Intravenous Q6H  . DISCONTD: insulin glargine  30 Units Subcutaneous QHS   Continuous Infusions:   . sodium chloride 75 mL/hr at 04/20/11 0551   PRN Meds:.acetaminophen, HYDROcodone-acetaminophen, morphine, ondansetron (ZOFRAN) IV, ondansetron, zolpidem   Ht: 5\' 9"  (175.3 cm)  Wt: 233 lb 7.5 oz (105.9 kg)  Ideal Wt: 59.1 kg % Ideal Wt: 179%  Usual Wt: 237 lbs (02/19/11) % Usual Wt: 98%  Body mass index is 34.48 kg/(m^2).  Food/Nutrition Related Hx:   Pt working with PT at time of visit. Unable to discuss nutrition hx at this time.  Chart reviewed.  Pt admitted in December 2012 and seen by RD at that time.  Pt reported  an intentional 50-60 lbs wt loss over 2 years after starting Byetta her diabetes.  This has since been discontinued; pt is insulin-dependent. RN ordered consult for pt due to poor PO.  Pt was consuming 10-25% of meals since admission.  This morning pt consumed 90% of breakfast. Pt has a chronic stage 2 ulcer to her sacrum.  Managing with topical creams.  Labs:  CMP     Component Value Date/Time   NA 128* 04/20/2011 0320   K 4.2 04/20/2011 0320   CL 98 04/20/2011 0320   CO2 20 04/20/2011 0320   GLUCOSE 88 04/20/2011 0320   BUN 39* 04/20/2011 0320   CREATININE 1.89* 04/20/2011 0320   CALCIUM 8.5 04/20/2011 0320   CALCIUM 8.2* 10/10/2010 1613   PROT 5.5* 04/20/2011 0320   ALBUMIN 1.9* 04/20/2011 0320   AST 37 04/20/2011 0320   ALT 23 04/20/2011 0320   ALKPHOS 156* 04/20/2011 0320   BILITOT 0.3 04/20/2011 0320   GFRNONAA 25* 04/20/2011 0320   GFRAA 29* 04/20/2011 0320   CBG (last 3)   Basename 04/20/11 1219 04/20/11 0741 04/19/11 2112  GLUCAP 117* 82 133*    Intake: 10-90% of meals Output:   Diet Order: Carb Control  Supplements/Tube Feeding:  None at this time  IVF:    sodium chloride Last Rate: 75 mL/hr at 04/20/11 0551    Estimated Nutritional Needs:   Kcal: 2050-2350 kcal Protein: 80-100g protein Fluid: 2-2.3 L/day  NUTRITION DIAGNOSIS: -Increased nutrient needs (NI-5.1).  Status: Ongoing  RELATED TO: wound healing  AS EVIDENCE BY: chronic stage 2 pressure ulcer  MONITORING/EVALUATION(Goals): 1.  Food/Beverage; pt to consume >50% of meals.  EDUCATION NEEDS: -Education not appropriate at this time  INTERVENTION: 1.  General healthful diet;  Pt intake improved this morning compared to earlier in admission. Encourage continued increased intake.   2.  Supplements; pt not interested in supplements in the past.  Has been able to increase intake on her own.  Unit RD to monitor for need and order if warranted.  Dietitian #: [phone]  DOCUMENTATION CODES Per approved  criteria  -Obesity Unspecified    Loyce Dys [facility] 04/20/2011, 1:48 PM

## 2011-04-20 NOTE — Evaluation (Signed)
Physical Therapy Evaluation Patient Details Name: Kathryn [name] MRN: [id number] DOB: 11/26/1933 Today's Date: 04/20/2011  Recommend continued PT at SNF  Problem List:  Patient Active Problem List  Diagnoses  . DM  . HYPERCHOLESTEROLEMIA  IIA  . MACULAR DEGENERATION  . HYPERTENSION, UNSPECIFIED  . Atrial fibrillation  . OSTEOARTHRITIS  . OSTEOPOROSIS  . EDEMA  . Anemia  . Pyelonephritis, acute  . Renal insufficiency  . Hyponatremia  . Obesity  . Bacteremia due to Klebsiella pneumoniae    Past Medical History:  Past Medical History  Diagnosis Date  . Osteoporosis   . DM (diabetes mellitus)   . HTN (hypertension)     unspecified  . Atrial fibrillation   . Macular degeneration   . Osteoarthritis   . Edema   . Anemia   . Hydronephrosis     left  . UTI (lower urinary tract infection)    Past Surgical History:  Past Surgical History  Procedure Date  . Replacement total knee 08/2007    bilateral  . Appendectomy   . Cholecystectomy   . Colonoscopy   . Upper gastrointestinal endoscopy     PT Assessment/Plan/Recommendation PT Assessment Clinical Impression Statement: pt with limited mobility preadmission now limited by nausea, urinary incontinence will need continued PT at SNF to regain functional independence.  Recommend she get OOB with nursing staff and STEDY device. PT Recommendation/Assessment: Patient will need skilled PT in the acute care venue PT Problem List: Decreased strength;Decreased activity tolerance;Decreased range of motion;Obesity;Other (comment) (nausea) Barriers to Discharge: None PT Therapy Diagnosis : Difficulty walking;Abnormality of gait;Generalized weakness PT Plan PT Frequency: Min 3X/week PT Treatment/Interventions: DME instruction;Gait training;Functional mobility training;Therapeutic activities;Therapeutic exercise;Patient/family education PT Recommendation Recommendations for Other Services: OT consult Follow Up Recommendations:  Skilled nursing facility Equipment Recommended: Defer to next venue PT Goals  Acute Rehab PT Goals PT Goal Formulation: With patient Time For Goal Achievement: 2 weeks Pt will go Supine/Side to Sit: with supervision PT Goal: Supine/Side to Sit - Progress: Goal set today Pt will go Sit to Stand: with supervision PT Goal: Sit to Stand - Progress: Goal set today Pt will Ambulate: 1 - 15 feet;with mod assist;with rolling walker PT Goal: Ambulate - Progress: Goal set today  PT Evaluation Precautions/Restrictions  Precautions Precautions: Fall Restrictions Weight Bearing Restrictions: No Prior Functioning  Home Living Lives With: Spouse Receives Help From: Personal care attendant Type of Home: Skilled Nursing Facility Home Layout: One level Home Access: Level entry Home Adaptive Equipment: Walker - rolling;Wheelchair - manual Additional Comments: pt had been receiving therapy at [facility] Prior Function Level of Independence: Requires assistive device for independence;Needs assistance with tranfers;Needs assistance with gait Able to Take Stairs?: No Comments: pt reports she has been getting around in a wheelchair Cognition Cognition Arousal/Alertness: Awake/alert Overall Cognitive Status: Appears within functional limits for tasks assessed Sensation/Coordination Coordination Gross Motor Movements are Fluid and Coordinated: Yes Extremity Assessment RLE Assessment RLE Assessment: Exceptions to [facility] RLE AROM (degrees) RLE Overall AROM Comments: pt lacks full extension and ankle ROM. AROM is limited with generalized weakness and muscle atrophy of leg LLE Assessment LLE Assessment: Exceptions to WFL LLE AROM (degrees) LLE Overall AROM Comments: pt with generalized weakness and lacof of ROM with knee lacking terminal extension Mobility (including Balance) Bed Mobility Bed Mobility: Yes Rolling Right: 3: Mod assist;With rail Rolling Right Details (indicate cue type and  reason): pt needed cues to place leg up to push and assist roll Rolling Left: 3: Mod assist  Rolling Left Details (indicate cue type and reason): assist to position leg to push hip over into sidelying Supine to Sit: 2: Max assist;HOB elevated (Comment degrees);Other (comment) (HOB elevated to max amount) Supine to Sit Details (indicate cue type and reason): pt was able to move legs to EOB and reach with right arm for left bedrail.  Needed assist to scoot around to sit on EOB Sit to Supine: 1: +2 Total assist;HOB flat;Other (comment);Patient percentage (comment) (pt ~50%) Sit to Supine - Details (indicate cue type and reason): pt needed assist to lower shouleders to bed and lift legs up onto bed Transfers Transfers: Yes Sit to Stand: 3: Mod assist;From elevated surface;With upper extremity assist Sit to Stand Details (indicate cue type and reason): IV became dislodged while patient was scooting to EOB, RN came to remove it.  Pt used left arm to push up into standing. with RW.  Pt was able to stand for about 1 minute. Stand to Sit: 3: Mod assist Stand to Sit Details: to control descent Ambulation/Gait Ambulation/Gait: No Stairs: No Wheelchair Mobility Wheelchair Mobility: No  Posture/Postural Control Posture/Postural Control: Postural limitations Postural Limitations: pt obese and deconditioned with impairment in ROM of both legs and left arm. Balance Balance Assessed: No Static Sitting Balance Static Sitting - Balance Support: Right upper extremity supported;Feet supported Static Sitting - Level of Assistance: 6: Modified independent (Device/Increase time) Static Sitting - Comment/# of Minutes: 5 Exercise    End of Session PT - End of Session Equipment Utilized During Treatment: Other (comment) (RW) Activity Tolerance: Patient limited by fatigue;Other (comment) (limited by nausea) Patient left: in bed;with call bell in reach Nurse Communication: Mobility status for transfers;Mobility  status for ambulation General Behavior During Session: [facility] for tasks performed (pt needs much positive encouragement) Cognition: WFL for tasks performed  Donnetta Hail 04/20/2011, 3:24 PM

## 2011-04-20 NOTE — Consult Note (Signed)
Infectious Diseases Initial Consultation        Total days of antibiotics 4        Day 2 imipenem         Date of Admission:  04/17/2011  Date of Consult:  04/20/2011  Reason for Consult: Pyelonephritis with Klebsiella bacteremia Referring Physician: Dr. Ardyth Harps   Problem List:  Principal Problem:  *Pyelonephritis, acute Active Problems:  Bacteremia due to Klebsiella pneumoniae  DM  HYPERTENSION, UNSPECIFIED  Atrial fibrillation  Anemia  Renal insufficiency  Hyponatremia  Obesity   Recommendations: 1. Change imipenem to ceftriaxone   Assessment: Kathryn [name] has bacteremic Klebsiella pyelonephritis. She has defervesced and seems to be improving slowly. Her noncontrast CT scan shows some right retroperitoneal edema along the course of the right ureter and there is a mention of a small gas bubble which may be extraluminal to the right ureter. I am not aware that she's had any instrumentation which could lead to a ureteral leak. She did not have oral contrast and it would be impossible to completely exclude bowel leak. Her Klebsiella is fairly sensitive so I will narrow her antibiotic therapy down to ceftriaxone. She will need at least 2 weeks of therapy but possibly longer depending on the clinical course and further investigation of her CT findings. I will call Dr. Evlyn Kanner today to discuss the situation with him.    HPI: Kathryn [name] is a 78 y.o. female who was admitted 4 days ago from her skilled nursing facility with a fever of just over 103. Admission urine culture grew Klebsiella and one of 2 blood cultures are also growing Klebsiella. My partner, Dr. Daiva Eves, came to see her last evening but she was undergoing her CT scan at that time.   Review of Systems: Review of systems not obtained due to patient factors.     Marland Kitchen darifenacin  7.5 mg Oral Daily  . ferrous sulfate  325 mg Oral Q breakfast  . imipenem-cilastatin  250 mg Intravenous Q6H  . insulin aspart  0-15  Units Subcutaneous TID WC  . insulin aspart  5 Units Subcutaneous TID WC  . insulin glargine  30 Units Subcutaneous QHS  . losartan  100 mg Oral Daily  . metoprolol tartrate  25 mg Oral BID  . multivitamin  1 tablet Oral Daily  . pantoprazole  40 mg Oral Daily  . polyethylene glycol  17 g Oral Daily  . rivaroxaban  15 mg Oral Daily  . rosuvastatin  20 mg Oral Daily  . senna-docusate  2 tablet Oral QHS  . DISCONTD: insulin glargine  30 Units Subcutaneous QHS    Past Medical History  Diagnosis Date  . Osteoporosis   . DM (diabetes mellitus)   . HTN (hypertension)     unspecified  . Atrial fibrillation   . Macular degeneration   . Osteoarthritis   . Edema   . Anemia   . Hydronephrosis     left  . UTI (lower urinary tract infection)     History  Substance Use Topics  . Smoking status: Never Smoker   . Smokeless tobacco: Never Used  . Alcohol Use: No    Family History  Problem Relation Age of Onset  . Diabetes Father   . Atrial fibrillation Father   . Breast cancer Paternal Grandmother   . Heart disease Mother    Allergies  Allergen Reactions  . Celebrex (Celecoxib)   . Nsaids   . Sulfa Antibiotics  OBJECTIVE: Blood pressure 155/82, pulse 98, temperature 97.5 F (36.4 C), temperature source Oral, resp. rate 20, height 5\' 9"  (1.753 m), weight 233 lb 7.5 oz (105.9 kg), SpO2 94.00%.  I did not examine her today  Microbiology: Recent Results (from the past 240 hour(s))  CULTURE, BLOOD (ROUTINE X 2)     Status: Normal (Preliminary result)   Collection Time   04/17/11  6:45 PM      Component Value Range Status Comment   Specimen Description BLOOD RIGHT HAND   Final    Special Requests BOTTLES DRAWN AEROBIC AND ANAEROBIC 5 CC EACH   Final    Culture  Setup Time [id number]   Final    Culture     Final    Value:        BLOOD CULTURE RECEIVED NO GROWTH TO DATE CULTURE WILL BE HELD FOR 5 DAYS BEFORE ISSUING A FINAL NEGATIVE REPORT   Report Status PENDING    Incomplete   URINE CULTURE     Status: Normal   Collection Time   04/17/11  7:31 PM      Component Value Range Status Comment   Specimen Description URINE, CATHETERIZED   Final    Special Requests NONE   Final    Culture  Setup Time [id number]   Final    Colony Count 85,000 COLONIES/ML   Final    Culture KLEBSIELLA PNEUMONIAE   Final    Report Status 04/19/2011 FINAL   Final    Organism ID, Bacteria KLEBSIELLA PNEUMONIAE   Final   CULTURE, BLOOD (ROUTINE X 2)     Status: Normal   Collection Time   04/17/11  9:50 PM      Component Value Range Status Comment   Specimen Description BLOOD LEFT ARM   Final    Special Requests BOTTLES DRAWN AEROBIC AND ANAEROBIC 5CC   Final    Culture  Setup Time [id number]   Final    Culture     Final    Value: KLEBSIELLA PNEUMONIAE     Note: CRITICAL RESULT CALLED TO, READ BACK BY AND VERIFIED WITH: DEBRA ANDERSON @ 840PM BY VINCJ 04/18/11   Report Status 04/20/2011 FINAL   Final    Organism ID, Bacteria KLEBSIELLA PNEUMONIAE   Final   MRSA PCR SCREENING     Status: Normal   Collection Time   04/18/11 12:17 AM      Component Value Range Status Comment   MRSA by PCR NEGATIVE  NEGATIVE  Final   CULTURE, BLOOD (ROUTINE X 2)     Status: Normal (Preliminary result)   Collection Time   04/19/11  9:50 AM      Component Value Range Status Comment   Specimen Description BLOOD LEFT HAND   Final    Special Requests BOTTLES DRAWN AEROBIC AND ANAEROBIC 10CC   Final    Culture  Setup Time [id number]   Final    Culture     Final    Value:        BLOOD CULTURE RECEIVED NO GROWTH TO DATE CULTURE WILL BE HELD FOR 5 DAYS BEFORE ISSUING A FINAL NEGATIVE REPORT   Report Status PENDING   Incomplete   CULTURE, BLOOD (ROUTINE X 2)     Status: Normal (Preliminary result)   Collection Time   04/19/11 10:00 AM      Component Value Range Status Comment   Specimen Description BLOOD LEFT ARM   Final    Special Requests BOTTLES DRAWN  AEROBIC AND ANAEROBIC 10CC   Final     Culture  Setup Time [id number]   Final    Culture     Final    Value:        BLOOD CULTURE RECEIVED NO GROWTH TO DATE CULTURE WILL BE HELD FOR 5 DAYS BEFORE ISSUING A FINAL NEGATIVE REPORT   Report Status PENDING   Incomplete      Cliffton Asters, MD [facility] [phone] pager   [phone] cell 04/20/2011, 1:25 PM

## 2011-04-20 NOTE — Progress Notes (Signed)
Occupational Therapy Chart reviewed. Checked on patient who is reporting 7/10 pain in stomach and would like to wait awhile to start therapy. Nsg states she has had pain meds. Will check back later today as able. Judithann Sauger OTR/L [phone] 04/20/2011

## 2011-04-21 ENCOUNTER — Inpatient Hospital Stay (HOSPITAL_COMMUNITY): Payer: Medicare Other

## 2011-04-21 LAB — CBC
MCH: 30.6 pg (ref 26.0–34.0)
MCHC: 32.8 g/dL (ref 30.0–36.0)
MCV: 93.2 fL (ref 78.0–100.0)
Platelets: 205 10*3/uL (ref 150–400)
RDW: 16.6 % — ABNORMAL HIGH (ref 11.5–15.5)

## 2011-04-21 LAB — COMPREHENSIVE METABOLIC PANEL
AST: 34 U/L (ref 0–37)
CO2: 22 mEq/L (ref 19–32)
Calcium: 8.5 mg/dL (ref 8.4–10.5)
Creatinine, Ser: 1.59 mg/dL — ABNORMAL HIGH (ref 0.50–1.10)
GFR calc non Af Amer: 31 mL/min — ABNORMAL LOW (ref >90)
Total Protein: 5.7 g/dL — ABNORMAL LOW (ref 6.0–8.3)

## 2011-04-21 LAB — GLUCOSE, CAPILLARY
Glucose-Capillary: 177 mg/dL — ABNORMAL HIGH (ref 70–99)
Glucose-Capillary: 191 mg/dL — ABNORMAL HIGH (ref 70–99)

## 2011-04-21 MED ORDER — HYDRALAZINE HCL 20 MG/ML IJ SOLN
10.0000 mg | Freq: Four times a day (QID) | INTRAMUSCULAR | Status: DC | PRN
  Administered 2011-04-21: 10 mg via INTRAVENOUS
  Filled 2011-04-21 (×2): qty 1

## 2011-04-21 MED ORDER — RESOURCE INSTANT PROTEIN PO PWD PACKET
6.0000 g | Freq: Three times a day (TID) | ORAL | Status: DC
  Administered 2011-04-21 – 2011-05-02 (×23): 6 g via ORAL
  Filled 2011-04-21 (×6): qty 6
  Filled 2011-04-21: qty 227
  Filled 2011-04-21 (×17): qty 6

## 2011-04-21 NOTE — Progress Notes (Signed)
Patient ID: Kathryn [name], female   DOB: 1933-03-27, 78 y.o.   MRN: [id number]  INFECTIOUS DISEASE PROGRESS NOTE    Date of Admission:  04/17/2011    Total days of antibiotics 5        Day 2 ceftriaxone         Principal Problem:  *Pyelonephritis, acute Active Problems:  Bacteremia due to Klebsiella pneumoniae  DM  HYPERTENSION, UNSPECIFIED  Atrial fibrillation  Anemia  Renal insufficiency  Hyponatremia  Obesity      . cefTRIAXone (ROCEPHIN)  IV  1 g Intravenous Q24H  . darifenacin  7.5 mg Oral Daily  . ferrous sulfate  325 mg Oral Q breakfast  . insulin aspart  0-15 Units Subcutaneous TID WC  . insulin aspart  5 Units Subcutaneous TID WC  . insulin glargine  30 Units Subcutaneous QHS  . losartan  100 mg Oral Daily  . metoprolol tartrate  25 mg Oral BID  . multivitamin  1 tablet Oral Daily  . pantoprazole  40 mg Oral Daily  . polyethylene glycol  17 g Oral Daily  . rivaroxaban  15 mg Oral Daily  . rosuvastatin  20 mg Oral Daily  . senna-docusate  2 tablet Oral QHS  . DISCONTD: imipenem-cilastatin  250 mg Intravenous Q6H    Subjective: She did not sleep well last night and therefore is not feeling particularly good this morning. Her abdominal pain has improved but still bothers her when she coughs. She has chronic problems with hematuria and incontinence but notes that she did have some mild dysuria in the days before she began having fever.  Objective: Temp:  [97.8 F (36.6 C)-99.6 F (37.6 C)] 97.8 F (36.6 C) (02/13 0558) Pulse Rate:  [80-98] 80  (02/13 0558) Resp:  [16-20] 16  (02/13 0558) BP: (133-154)/(57-81) 133/81 mmHg (02/13 0558) SpO2:  [93 %-95 %] 95 % (02/13 0558)  General: She is alert, conversant but appears mildly uncomfortable Skin: No rash Lungs: Clear Cor: Regular S1 and S2 no murmurs Abdomen: Soft and nontender with active bowel sounds   Lab Results Lab Results  Component Value Date   WBC 9.3 04/21/2011   HGB 9.0* 04/21/2011   HCT 27.4*  04/21/2011   MCV 93.2 04/21/2011   PLT 205 04/21/2011    Lab Results  Component Value Date   CREATININE 1.59* 04/21/2011   BUN 34* 04/21/2011   NA 126* 04/21/2011   K 3.9 04/21/2011   CL 97 04/21/2011   CO2 22 04/21/2011    Lab Results  Component Value Date   ALT 29 04/21/2011   AST 34 04/21/2011   ALKPHOS 244* 04/21/2011   BILITOT 0.3 04/21/2011       Microbiology: Recent Results (from the past 240 hour(s))  CULTURE, BLOOD (ROUTINE X 2)     Status: Normal (Preliminary result)   Collection Time   04/17/11  6:45 PM      Component Value Range Status Comment   Specimen Description BLOOD RIGHT HAND   Final    Special Requests BOTTLES DRAWN AEROBIC AND ANAEROBIC 5 CC EACH   Final    Culture  Setup Time [id number]   Final    Culture     Final    Value:        BLOOD CULTURE RECEIVED NO GROWTH TO DATE CULTURE WILL BE HELD FOR 5 DAYS BEFORE ISSUING A FINAL NEGATIVE REPORT   Report Status PENDING   Incomplete   URINE CULTURE  Status: Normal   Collection Time   04/17/11  7:31 PM      Component Value Range Status Comment   Specimen Description URINE, CATHETERIZED   Final    Special Requests NONE   Final    Culture  Setup Time [id number]   Final    Colony Count 85,000 COLONIES/ML   Final    Culture KLEBSIELLA PNEUMONIAE   Final    Report Status 04/19/2011 FINAL   Final    Organism ID, Bacteria KLEBSIELLA PNEUMONIAE   Final   CULTURE, BLOOD (ROUTINE X 2)     Status: Normal   Collection Time   04/17/11  9:50 PM      Component Value Range Status Comment   Specimen Description BLOOD LEFT ARM   Final    Special Requests BOTTLES DRAWN AEROBIC AND ANAEROBIC 5CC   Final    Culture  Setup Time [id number]   Final    Culture     Final    Value: KLEBSIELLA PNEUMONIAE     Note: CRITICAL RESULT CALLED TO, READ BACK BY AND VERIFIED WITH: DEBRA ANDERSON @ 840PM BY VINCJ 04/18/11   Report Status 04/20/2011 FINAL   Final    Organism ID, Bacteria KLEBSIELLA PNEUMONIAE   Final   MRSA PCR SCREENING      Status: Normal   Collection Time   04/18/11 12:17 AM      Component Value Range Status Comment   MRSA by PCR NEGATIVE  NEGATIVE  Final   CULTURE, BLOOD (ROUTINE X 2)     Status: Normal (Preliminary result)   Collection Time   04/19/11  9:50 AM      Component Value Range Status Comment   Specimen Description BLOOD LEFT HAND   Final    Special Requests BOTTLES DRAWN AEROBIC AND ANAEROBIC 10CC   Final    Culture  Setup Time [id number]   Final    Culture     Final    Value:        BLOOD CULTURE RECEIVED NO GROWTH TO DATE CULTURE WILL BE HELD FOR 5 DAYS BEFORE ISSUING A FINAL NEGATIVE REPORT   Report Status PENDING   Incomplete   CULTURE, BLOOD (ROUTINE X 2)     Status: Normal (Preliminary result)   Collection Time   04/19/11 10:00 AM      Component Value Range Status Comment   Specimen Description BLOOD LEFT ARM   Final    Special Requests BOTTLES DRAWN AEROBIC AND ANAEROBIC 10CC   Final    Culture  Setup Time [id number]   Final    Culture     Final    Value:        BLOOD CULTURE RECEIVED NO GROWTH TO DATE CULTURE WILL BE HELD FOR 5 DAYS BEFORE ISSUING A FINAL NEGATIVE REPORT   Report Status PENDING   Incomplete     Studies/Results: Ct Abdomen Pelvis Wo Contrast  04/19/2011  *RADIOLOGY REPORT*  Clinical Data:  Nausea.  Vomiting.  Fatigue.  Hydronephrosis. Question stone.  Prior cholecystectomy.  CT ABDOMEN AND PELVIS WITHOUT CONTRAST (CT UROGRAM)  Technique: Contiguous axial images of the abdomen and pelvis without oral or intravenous contrast were obtained.  Comparison: Ultrasound of 04/19/2011 and 10/11/2010.  Prior CT of 02/13/2010.  Findings:  Exam is limited for evaluation of entities other than urinary tract calculi due to lack of oral or intravenous contrast.   Motion degraded evaluation of the lung bases/inferior chest. Cardiomegaly with advanced multivessel coronary artery  atherosclerosis.  Trace bilateral pleural effusions.  Possible calcified lesion in the right breast,  incompletely imaged on image 1 of series 2.  Minimal exclusion of the liver.  Normal spleen, stomach, pancreas. Cholecystectomy.  No biliary ductal dilatation.  Normal right adrenal gland.  Mild left adrenal thickening without well-defined mass.  Bilateral moderate perirenal edema.  Bilateral renal cortical thinning.  Moderate left-sided hydroureter throughout.  Mild right- sided hydroureter.  No evidence of ureteric stone.  Both ureters are followed to the level of the bladder, which is not distended.  No retroperitoneal or retrocrural adenopathy.  Colonic diverticulosis.  Normal terminal ileum.  Small bowel loops are normal in caliber.  There is a lower abdominal upper pelvic extraperitoneal/retroperitoneal edema which is nonspecific.  Foci of gas are seen along the course of the right ureter, including on images 55 - 58 of series 2. These areas are poorly evaluated, secondary to lack of contrast.  The appendix is not identified, and by report is surgically absent.  No pelvic adenopathy.  The bladder is collapsed around a Foley catheter.  Normal uterus, without adnexal mass or significant free pelvic fluid.  Diffuse anasarca. No acute osseous abnormality.  IMPRESSION:  1.  Left greater than right hydroureter.  No evidence of obstructive stone.  This is followed to the level of the bladder, which is collapsed around a Foley catheter.  Cannot exclude debris, hemorrhage, or less likely neoplasm within the bladder causing hydroureter. Cystoscopy or repeat imaging after removal of Foley suggested. 2.  Degraded exam, secondary patient size and lack of oral / IV contrast. 3.  Diffuse retroperitoneal edema with air along the course of the right ureter. Adjacent bowel loops are poorly evaluated secondary to lack of enteric contrast ( i.e. this air could be within bowel loops).  Especially if there are abdominal symptoms to suggest extraluminal bowel air or ureteric injury, further evaluation with contrast enhanced  (preferably oral and IV) CT should be considered. The study was made a "call report." 4.  Small bilateral pleural effusions. 5.  Possible calcified lesion within the right breast, indeterminate and incompletely imaged.  Original Report Authenticated By: Consuello Bossier, M.D.   US Renal  04/19/2011  *RADIOLOGY REPORT*  Clinical Data: Worsening renal function and urinary tract infection.  RENAL/URINARY TRACT ULTRASOUND COMPLETE  Comparison:  Renal ultrasound 10/11/2010 and abdominal CT 02/13/2010  Findings:  Right Kidney:  Right kidney measures 11.1 cm in length without hydronephrosis. Normal appearance of the right renal parenchyma. There is mild fullness of the right renal pelvis which could represent an extrarenal pelvis.  Left Kidney:  Left kidney measures 12.4 cm in length with fullness of the left renal collecting system and left renal pelvis. Findings are compatible with hydronephrosis and increased since the exam on 10/11/2010.  Bladder:  There is a Foley catheter within the urinary bladder and the bladder appears to be decompressed.  IMPRESSION: Moderate left hydronephrosis.  The degree of left hydronephrosis has increased since the exam on 10/11/2010.  A ureteral obstruction cannot be excluded and the patient may benefit from a CT of the abdomen and pelvis for further characterization.  Original Report Authenticated By: Richarda Overlie, M.D.     Assessment: Her fever, leukocytosis and renal insufficiency are all improving. I will continue ceftriaxone for what is probably bacteremic Klebsiella pyelonephritis but agree with the repeat CT scan with oral contrast to look for evidence of a gut leak.  Plan: 1. Continue ceftriaxone   Cliffton Asters, MD [facility] [phone] pager   [phone] cell 04/21/2011, 10:26 AM

## 2011-04-21 NOTE — Progress Notes (Signed)
CSW reviewed chart. Plan is to return to [facility] at d/c. FL2 on chart to sign.  Vennie Homans, [place] 04/21/2011 12:22 PM [phone]

## 2011-04-21 NOTE — Progress Notes (Signed)
Subjective: Had wheezing last night. Better today. Abdominal pain worsened this PM. CT didn't show much. Renal function is better.    Objective: Vital signs in last 24 hours: Temp:  [97.1 F (36.2 C)-99.6 F (37.6 C)] 97.1 F (36.2 C) (02/13 1318) Pulse Rate:  [80-108] 82  (02/13 1318) Resp:  [16-20] 18  (02/13 1318) BP: (101-154)/(57-81) 101/73 mmHg (02/13 1318) SpO2:  [93 %-95 %] 94 % (02/13 1318)  Intake/Output from previous day: 02/12 0701 - 02/13 0700 In: 1130 [P.O.:480; I.V.:600; IV Piggyback:50] Out: 1775 [Urine:1775] Intake/Output this shift: Total I/O In: 600 [I.V.:600] Out: 1150 [Urine:1150]  General: mild distress non toxic, anicteric, oral membranes moist. Lungs clear. Ht IR/IR with murmur. Abdomen obese soft sl tender but no rebound. Hypoactive BS. Alert awake, mentating well, no tremor   Lab Results   Basename 04/21/11 0335 04/20/11 0320  WBC 9.3 11.2*  RBC 2.94* 2.88*  HGB 9.0* 9.3*  HCT 27.4* 26.9*  MCV 93.2 93.4  MCH 30.6 32.3  RDW 16.6* 16.6*  PLT 205 171    Basename 04/21/11 0335 04/20/11 0320  NA 126* 128*  K 3.9 4.2  CL 97 98  CO2 22 20  GLUCOSE 136* 88  BUN 34* 39*  CREATININE 1.59* 1.89*  CALCIUM 8.5 8.5    Studies/Results: Ct Abdomen Pelvis Wo Contrast  04/21/2011  *RADIOLOGY REPORT*  Clinical Data: Evaluate for retroperitoneal gas.  History of abdominal pain and hematuria.  CT ABDOMEN AND PELVIS WITHOUT CONTRAST  Technique:  Multidetector CT imaging of the abdomen and pelvis was performed following the standard protocol without intravenous contrast.  Comparison: CT abdomen 04/19/2011  Findings: The upper abdomen was imaged twice due to excessive patient motion.  Lung bases are clear with exception of small pleural effusions.  Again noted is a small amount of gas adjacent to the mid right ureter.  The amount gas has slightly decreased since the prior examination.  There is mild-moderate right hydroureteronephrosis with soft tissue edema  surrounding the mid right ureter and similar to the prior examination. Slightly increased distention of the right renal pelvis compared to the prior examination.  There is a Foley catheter within the urinary bladder.  Again noted is mild dilatation of the left renal collecting system.  There is slightly decreased distention of the left renal collecting system compared to the prior examination.  Stable appearance of the uterus and adnexal structures.  The right adnexal structures are in the same region as the edematous changes around the right ureter.  Extensive edema in the presacral region. There is diffuse mesenteric edema and perirenal edema.  No evidence for kidney stones.  Limited evaluation of the intra-abdominal solid organs due to the lack of intravenous contrast.  No gross abnormalities in the liver, spleen or pancreatic tissue.  Stable fullness of the adrenal tissue. Coronary arteries are heavily calcified.  Diffuse degenerative bone changes.  IMPRESSION: There is persistent dilatation of the renal collecting systems bilaterally.  There is now slightly increased dilatation of the right compared to the left.  No evidence for obstructing stones.  There is persistent gas in the region of the right mid ureter, but the etiology of the air remains indeterminate.   The edema and inflammatory changes centered around the the right ureter is concerning for an inflammatory or infectious etiology.  Small pleural effusions.  Edematous changes throughout the abdomen and pelvis.  Original Report Authenticated By: Richarda Overlie, M.D.    Scheduled Meds:   . cefTRIAXone (ROCEPHIN)  IV  1 g Intravenous Q24H  . darifenacin  7.5 mg Oral Daily  . ferrous sulfate  325 mg Oral Q breakfast  . insulin aspart  0-15 Units Subcutaneous TID WC  . insulin aspart  5 Units Subcutaneous TID WC  . insulin glargine  30 Units Subcutaneous QHS  . losartan  100 mg Oral Daily  . metoprolol tartrate  25 mg Oral BID  . multivitamin  1  tablet Oral Daily  . pantoprazole  40 mg Oral Daily  . polyethylene glycol  17 g Oral Daily  . protein supplement  6 g Oral TID WC  . rivaroxaban  15 mg Oral Daily  . rosuvastatin  20 mg Oral Daily  . senna-docusate  2 tablet Oral QHS   Continuous Infusions:   . sodium chloride 1,000 mL (04/21/11 1006)   PRN Meds:acetaminophen, HYDROcodone-acetaminophen, magic mouthwash, menthol-cetylpyridinium, morphine, ondansetron (ZOFRAN) IV, ondansetron, zolpidem  AssePYELONEPHRITIS/SEPSIS FROM A URINARY SOURCE: Klebsiella in blood and urine. Now on rocephin  WBC sl better at 9.3  No fever of note.    ABDOMINAL PAIN/VOMITING : CT actually better overall but these are still urologic issues that conceivably could be causing pain RENAL INSUFFICIENCY: sl better today at 1.59  HYDROURETER: Seen this AM by urology. Air seen outside the ureter. ? Some disruption. still has hydronephrosis bilat ATRIAL FIBRILLATION: Stable rate  DM 2: FBSfair at 136  HYPERTENSION: BP reasonable overall  ANEMIA: About the same at 9.0 HYPONATREMIA: ? Due to nausea. Still on the low side at 128 ssment/Plan:    LOS: 4 days   Kathryn [name] [name] 04/21/2011, 5:17 PM

## 2011-04-21 NOTE — Progress Notes (Signed)
Subjective: Patient reports not sleeping well but otherwise feeling better. She has had a decrease in abdominal pain. She has remained afebrile. She is scheduled for CT with oral contrast this morning.  Objective: Vital signs in last 24 hours: Temp:  [97.8 F (36.6 C)-99.6 F (37.6 C)] 97.8 F (36.6 C) (02/13 0558) Pulse Rate:  [80-98] 80  (02/13 0558) Resp:  [16-20] 16  (02/13 0558) BP: (133-155)/(57-82) 133/81 mmHg (02/13 0558) SpO2:  [93 %-95 %] 95 % (02/13 0558)  Intake/Output from previous day: 02/12 0701 - 02/13 0700 In: 1130 [P.O.:480; I.V.:600; IV Piggyback:50] Out: 1775 [Urine:1775] Intake/Output this shift:    Physical Exam:  General:alert and cooperative GI: not done and soft, non tender, normal bowel sounds, no palpable masses, no organomegaly, no inguinal hernia Female genitalia: not done  Lab Results:  Basename 04/21/11 0335 04/20/11 0320 04/19/11 0325  HGB 9.0* 9.3* 9.1*  HCT 27.4* 26.9* 27.1*   BMET  Basename 04/21/11 0335 04/20/11 0320  NA 126* 128*  K 3.9 4.2  CL 97 98  CO2 22 20  GLUCOSE 136* 88  BUN 34* 39*  CREATININE 1.59* 1.89*  CALCIUM 8.5 8.5   No results found for this basename: LABPT:3,INR:3 in the last 72 hours No results found for this basename: LABURIN:1 in the last 72 hours Results for orders placed during the hospital encounter of 04/17/11  CULTURE, BLOOD (ROUTINE X 2)     Status: Normal (Preliminary result)   Collection Time   04/17/11  6:45 PM      Component Value Range Status Comment   Specimen Description BLOOD RIGHT HAND   Final    Special Requests BOTTLES DRAWN AEROBIC AND ANAEROBIC 5 CC EACH   Final    Culture  Setup Time [id number]   Final    Culture     Final    Value:        BLOOD CULTURE RECEIVED NO GROWTH TO DATE CULTURE WILL BE HELD FOR 5 DAYS BEFORE ISSUING A FINAL NEGATIVE REPORT   Report Status PENDING   Incomplete   URINE CULTURE     Status: Normal   Collection Time   04/17/11  7:31 PM      Component Value  Range Status Comment   Specimen Description URINE, CATHETERIZED   Final    Special Requests NONE   Final    Culture  Setup Time [id number]   Final    Colony Count 85,000 COLONIES/ML   Final    Culture KLEBSIELLA PNEUMONIAE   Final    Report Status 04/19/2011 FINAL   Final    Organism ID, Bacteria KLEBSIELLA PNEUMONIAE   Final   CULTURE, BLOOD (ROUTINE X 2)     Status: Normal   Collection Time   04/17/11  9:50 PM      Component Value Range Status Comment   Specimen Description BLOOD LEFT ARM   Final    Special Requests BOTTLES DRAWN AEROBIC AND ANAEROBIC 5CC   Final    Culture  Setup Time [id number]   Final    Culture     Final    Value: KLEBSIELLA PNEUMONIAE     Note: CRITICAL RESULT CALLED TO, READ BACK BY AND VERIFIED WITH: Bretta Bang @ 840PM BY VINCJ 04/18/11   Report Status 04/20/2011 FINAL   Final    Organism ID, Bacteria KLEBSIELLA PNEUMONIAE   Final   MRSA PCR SCREENING     Status: Normal   Collection Time   04/18/11 12:17 AM  Component Value Range Status Comment   MRSA by PCR NEGATIVE  NEGATIVE  Final   CULTURE, BLOOD (ROUTINE X 2)     Status: Normal (Preliminary result)   Collection Time   04/19/11  9:50 AM      Component Value Range Status Comment   Specimen Description BLOOD LEFT HAND   Final    Special Requests BOTTLES DRAWN AEROBIC AND ANAEROBIC 10CC   Final    Culture  Setup Time [id number]   Final    Culture     Final    Value:        BLOOD CULTURE RECEIVED NO GROWTH TO DATE CULTURE WILL BE HELD FOR 5 DAYS BEFORE ISSUING A FINAL NEGATIVE REPORT   Report Status PENDING   Incomplete   CULTURE, BLOOD (ROUTINE X 2)     Status: Normal (Preliminary result)   Collection Time   04/19/11 10:00 AM      Component Value Range Status Comment   Specimen Description BLOOD LEFT ARM   Final    Special Requests BOTTLES DRAWN AEROBIC AND ANAEROBIC 10CC   Final    Culture  Setup Time [id number]   Final    Culture     Final    Value:        BLOOD CULTURE RECEIVED NO  GROWTH TO DATE CULTURE WILL BE HELD FOR 5 DAYS BEFORE ISSUING A FINAL NEGATIVE REPORT   Report Status PENDING   Incomplete     Studies/Results: Ct Abdomen Pelvis Wo Contrast  04/19/2011  *RADIOLOGY REPORT*  Clinical Data:  Nausea.  Vomiting.  Fatigue.  Hydronephrosis. Question stone.  Prior cholecystectomy.  CT ABDOMEN AND PELVIS WITHOUT CONTRAST (CT UROGRAM)  Technique: Contiguous axial images of the abdomen and pelvis without oral or intravenous contrast were obtained.  Comparison: Ultrasound of 04/19/2011 and 10/11/2010.  Prior CT of 02/13/2010.  Findings:  Exam is limited for evaluation of entities other than urinary tract calculi due to lack of oral or intravenous contrast.   Motion degraded evaluation of the lung bases/inferior chest. Cardiomegaly with advanced multivessel coronary artery atherosclerosis.  Trace bilateral pleural effusions.  Possible calcified lesion in the right breast, incompletely imaged on image 1 of series 2.  Minimal exclusion of the liver.  Normal spleen, stomach, pancreas. Cholecystectomy.  No biliary ductal dilatation.  Normal right adrenal gland.  Mild left adrenal thickening without well-defined mass.  Bilateral moderate perirenal edema.  Bilateral renal cortical thinning.  Moderate left-sided hydroureter throughout.  Mild right- sided hydroureter.  No evidence of ureteric stone.  Both ureters are followed to the level of the bladder, which is not distended.  No retroperitoneal or retrocrural adenopathy.  Colonic diverticulosis.  Normal terminal ileum.  Small bowel loops are normal in caliber.  There is a lower abdominal upper pelvic extraperitoneal/retroperitoneal edema which is nonspecific.  Foci of gas are seen along the course of the right ureter, including on images 55 - 58 of series 2. These areas are poorly evaluated, secondary to lack of contrast.  The appendix is not identified, and by report is surgically absent.  No pelvic adenopathy.  The bladder is collapsed  around a Foley catheter.  Normal uterus, without adnexal mass or significant free pelvic fluid.  Diffuse anasarca. No acute osseous abnormality.  IMPRESSION:  1.  Left greater than right hydroureter.  No evidence of obstructive stone.  This is followed to the level of the bladder, which is collapsed around a Foley catheter.  Cannot exclude debris, hemorrhage,  or less likely neoplasm within the bladder causing hydroureter. Cystoscopy or repeat imaging after removal of Foley suggested. 2.  Degraded exam, secondary patient size and lack of oral / IV contrast. 3.  Diffuse retroperitoneal edema with air along the course of the right ureter. Adjacent bowel loops are poorly evaluated secondary to lack of enteric contrast ( i.e. this air could be within bowel loops).  Especially if there are abdominal symptoms to suggest extraluminal bowel air or ureteric injury, further evaluation with contrast enhanced (preferably oral and IV) CT should be considered. The study was made a "call report." 4.  Small bilateral pleural effusions. 5.  Possible calcified lesion within the right breast, indeterminate and incompletely imaged.  Original Report Authenticated By: Consuello Bossier, M.D.   US Renal  04/19/2011  *RADIOLOGY REPORT*  Clinical Data: Worsening renal function and urinary tract infection.  RENAL/URINARY TRACT ULTRASOUND COMPLETE  Comparison:  Renal ultrasound 10/11/2010 and abdominal CT 02/13/2010  Findings:  Right Kidney:  Right kidney measures 11.1 cm in length without hydronephrosis. Normal appearance of the right renal parenchyma. There is mild fullness of the right renal pelvis which could represent an extrarenal pelvis.  Left Kidney:  Left kidney measures 12.4 cm in length with fullness of the left renal collecting system and left renal pelvis. Findings are compatible with hydronephrosis and increased since the exam on 10/11/2010.  Bladder:  There is a Foley catheter within the urinary bladder and the bladder appears  to be decompressed.  IMPRESSION: Moderate left hydronephrosis.  The degree of left hydronephrosis has increased since the exam on 10/11/2010.  A ureteral obstruction cannot be excluded and the patient may benefit from a CT of the abdomen and pelvis for further characterization.  Original Report Authenticated By: Richarda Overlie, M.D.    Assessment/Plan  Clinically Ms. Gildner appears to be better. Her pain has diminished. Her abdominal exam is benign at this time. Her white blood cell count and creatinine continue to improve. Again she had some air noted in the retroperitoneum and 90 think it is important to make sure that that was intraluminal and not outside of the intestinal tract. Hopefully CT with enteric contrast will help facilitate that. Again she has been noted to have bilateral hydronephrosis of uncertain significance. Her renal function does continue to improve. Ultimately if her creatinine normalizes she may need additional CT imaging with intravenous contrast to fully assess the ureters/collecting system. Again she has had a multitude of about UA and support intermittent gross hematuria but this has continued to be a persistent problem and a definitive etiology of that other than chronic cystitis has not been elucidated.   LOS: 4 days   Jame Morrell S 04/21/2011, 8:51 AM

## 2011-04-21 NOTE — Progress Notes (Signed)
Occupational Therapy Note Chart reviewed. Per nsg pt is having a CT soon and requests that OT check back later. Will reattempt as able. Judithann Sauger OTR/L [phone] 04/21/2011

## 2011-04-22 DIAGNOSIS — B9689 Other specified bacterial agents as the cause of diseases classified elsewhere: Secondary | ICD-10-CM

## 2011-04-22 DIAGNOSIS — N390 Urinary tract infection, site not specified: Secondary | ICD-10-CM

## 2011-04-22 LAB — CBC
HCT: 26.8 % — ABNORMAL LOW (ref 36.0–46.0)
MCV: 91.8 fL (ref 78.0–100.0)
RBC: 2.92 MIL/uL — ABNORMAL LOW (ref 3.87–5.11)
RDW: 16.3 % — ABNORMAL HIGH (ref 11.5–15.5)
WBC: 8.7 10*3/uL (ref 4.0–10.5)

## 2011-04-22 LAB — COMPREHENSIVE METABOLIC PANEL
BUN: 32 mg/dL — ABNORMAL HIGH (ref 6–23)
CO2: 19 mEq/L (ref 19–32)
Chloride: 100 mEq/L (ref 96–112)
Creatinine, Ser: 1.52 mg/dL — ABNORMAL HIGH (ref 0.50–1.10)
GFR calc Af Amer: 38 mL/min — ABNORMAL LOW (ref >90)
GFR calc non Af Amer: 32 mL/min — ABNORMAL LOW (ref >90)
Total Bilirubin: 0.3 mg/dL (ref 0.3–1.2)

## 2011-04-22 LAB — GLUCOSE, CAPILLARY
Glucose-Capillary: 157 mg/dL — ABNORMAL HIGH (ref 70–99)
Glucose-Capillary: 152 mg/dL — ABNORMAL HIGH (ref 70–99)

## 2011-04-22 MED ORDER — PEG 3350-KCL-NA BICARB-NACL 420 G PO SOLR
4000.0000 mL | Freq: Once | ORAL | Status: DC
  Filled 2011-04-22: qty 4000

## 2011-04-22 MED ORDER — POLYETHYLENE GLYCOL 3350 17 G PO PACK
17.0000 g | PACK | Freq: Two times a day (BID) | ORAL | Status: DC
  Administered 2011-04-22 – 2011-04-25 (×7): 17 g via ORAL
  Filled 2011-04-22 (×13): qty 1

## 2011-04-22 MED ORDER — BISACODYL 10 MG RE SUPP
10.0000 mg | Freq: Every day | RECTAL | Status: DC | PRN
  Administered 2011-04-22: 10 mg via RECTAL
  Filled 2011-04-22: qty 1

## 2011-04-22 NOTE — Progress Notes (Signed)
Subjective: Patient reports feeling better this morning. She did have moderate abdominal pain last night but is considerably better this morning. She has remained afebrile.  Objective: Vital signs in last 24 hours: Temp:  [97.1 F (36.2 C)-98.8 F (37.1 C)] 98.1 F (36.7 C) (02/14 1001) Pulse Rate:  [68-92] 92  (02/14 1001) Resp:  [16-20] 20  (02/14 1001) BP: (101-172)/(70-91) 155/79 mmHg (02/14 1001) SpO2:  [92 %-96 %] 96 % (02/14 1001)  Intake/Output from previous day: 02/13 0701 - 02/14 0700 In: 600 [I.V.:600] Out: 1800 [Urine:1800] Intake/Output this shift: Total I/O In: 480 [P.O.:480] Out: 300 [Urine:300]  Physical Exam:  General:alert, cooperative and morbidly obese GI: not done and soft, non tender, normal bowel sounds, no palpable masses, no organomegaly, no inguinal hernia Female genitalia: not done   Lab Results:  Basename 04/22/11 0315 04/21/11 0335 04/20/11 0320  HGB 9.2* 9.0* 9.3*  HCT 26.8* 27.4* 26.9*   BMET  Basename 04/22/11 0315 04/21/11 0335  NA 130* 126*  K 3.9 3.9  CL 100 97  CO2 19 22  GLUCOSE 158* 136*  BUN 32* 34*  CREATININE 1.52* 1.59*  CALCIUM 8.2* 8.5   No results found for this basename: LABPT:3,INR:3 in the last 72 hours No results found for this basename: LABURIN:1 in the last 72 hours Results for orders placed during the hospital encounter of 04/17/11  CULTURE, BLOOD (ROUTINE X 2)     Status: Normal (Preliminary result)   Collection Time   04/17/11  6:45 PM      Component Value Range Status Comment   Specimen Description BLOOD RIGHT HAND   Final    Special Requests BOTTLES DRAWN AEROBIC AND ANAEROBIC 5 CC EACH   Final    Culture  Setup Time [id number]   Final    Culture     Final    Value:        BLOOD CULTURE RECEIVED NO GROWTH TO DATE CULTURE WILL BE HELD FOR 5 DAYS BEFORE ISSUING A FINAL NEGATIVE REPORT   Report Status PENDING   Incomplete   URINE CULTURE     Status: Normal   Collection Time   04/17/11  7:31 PM   Component Value Range Status Comment   Specimen Description URINE, CATHETERIZED   Final    Special Requests NONE   Final    Culture  Setup Time [id number]   Final    Colony Count 85,000 COLONIES/ML   Final    Culture KLEBSIELLA PNEUMONIAE   Final    Report Status 04/19/2011 FINAL   Final    Organism ID, Bacteria KLEBSIELLA PNEUMONIAE   Final   CULTURE, BLOOD (ROUTINE X 2)     Status: Normal   Collection Time   04/17/11  9:50 PM      Component Value Range Status Comment   Specimen Description BLOOD LEFT ARM   Final    Special Requests BOTTLES DRAWN AEROBIC AND ANAEROBIC 5CC   Final    Culture  Setup Time [id number]   Final    Culture     Final    Value: KLEBSIELLA PNEUMONIAE     Note: CRITICAL RESULT CALLED TO, READ BACK BY AND VERIFIED WITH: Bretta Bang @ 840PM BY VINCJ 04/18/11   Report Status 04/20/2011 FINAL   Final    Organism ID, Bacteria KLEBSIELLA PNEUMONIAE   Final   MRSA PCR SCREENING     Status: Normal   Collection Time   04/18/11 12:17 AM  Component Value Range Status Comment   MRSA by PCR NEGATIVE  NEGATIVE  Final   CULTURE, BLOOD (ROUTINE X 2)     Status: Normal (Preliminary result)   Collection Time   04/19/11  9:50 AM      Component Value Range Status Comment   Specimen Description BLOOD LEFT HAND   Final    Special Requests BOTTLES DRAWN AEROBIC AND ANAEROBIC 10CC   Final    Culture  Setup Time [id number]   Final    Culture     Final    Value:        BLOOD CULTURE RECEIVED NO GROWTH TO DATE CULTURE WILL BE HELD FOR 5 DAYS BEFORE ISSUING A FINAL NEGATIVE REPORT   Report Status PENDING   Incomplete   CULTURE, BLOOD (ROUTINE X 2)     Status: Normal (Preliminary result)   Collection Time   04/19/11 10:00 AM      Component Value Range Status Comment   Specimen Description BLOOD LEFT ARM   Final    Special Requests BOTTLES DRAWN AEROBIC AND ANAEROBIC 10CC   Final    Culture  Setup Time [id number]   Final    Culture     Final    Value:        BLOOD  CULTURE RECEIVED NO GROWTH TO DATE CULTURE WILL BE HELD FOR 5 DAYS BEFORE ISSUING A FINAL NEGATIVE REPORT   Report Status PENDING   Incomplete     Studies/Results: Ct Abdomen Pelvis Wo Contrast  04/21/2011  *RADIOLOGY REPORT*  Clinical Data: Evaluate for retroperitoneal gas.  History of abdominal pain and hematuria.  CT ABDOMEN AND PELVIS WITHOUT CONTRAST  Technique:  Multidetector CT imaging of the abdomen and pelvis was performed following the standard protocol without intravenous contrast.  Comparison: CT abdomen 04/19/2011  Findings: The upper abdomen was imaged twice due to excessive patient motion.  Lung bases are clear with exception of small pleural effusions.  Again noted is a small amount of gas adjacent to the mid right ureter.  The amount gas has slightly decreased since the prior examination.  There is mild-moderate right hydroureteronephrosis with soft tissue edema surrounding the mid right ureter and similar to the prior examination. Slightly increased distention of the right renal pelvis compared to the prior examination.  There is a Foley catheter within the urinary bladder.  Again noted is mild dilatation of the left renal collecting system.  There is slightly decreased distention of the left renal collecting system compared to the prior examination.  Stable appearance of the uterus and adnexal structures.  The right adnexal structures are in the same region as the edematous changes around the right ureter.  Extensive edema in the presacral region. There is diffuse mesenteric edema and perirenal edema.  No evidence for kidney stones.  Limited evaluation of the intra-abdominal solid organs due to the lack of intravenous contrast.  No gross abnormalities in the liver, spleen or pancreatic tissue.  Stable fullness of the adrenal tissue. Coronary arteries are heavily calcified.  Diffuse degenerative bone changes.  IMPRESSION: There is persistent dilatation of the renal collecting systems  bilaterally.  There is now slightly increased dilatation of the right compared to the left.  No evidence for obstructing stones.  There is persistent gas in the region of the right mid ureter, but the etiology of the air remains indeterminate.   The edema and inflammatory changes centered around the the right ureter is concerning for an inflammatory or infectious  etiology.  Small pleural effusions.  Edematous changes throughout the abdomen and pelvis.  Original Report Authenticated By: Richarda Overlie, M.D.    Assessment/Plan:  Ms. Uehara is clearly better clinically with decreased pain in a much less tender her abdominal exam. She has been afebrile and her white blood cell count is normal and her creatinine is trending towards improvement. I remain however quite puzzled by her initial presentation and this finding of retroperitoneal air. She clearly has positive cultures for Klebsiella in her blood and urine suggesting that her initial presentation was indeed consistent with pyelonephritis. This however would not cause retroperitoneal air which is typically found in situations where there is some type of bowel perforation. There is no evidence that there is inflamed colon anywhere near this area care, nor is there any obvious GI pathology appreciated. At this point I would recommend continued care with antibiotics and careful monitoring. If her renal function does normalize then a third CT of the abdomen and pelvis with both oral as well as IV contrast should be obtained to further assess both the retroperitoneum as well as the intermittent hydronephrosis. Of note I did briefly discuss this case with Dr. Brett Canales gross from general surgery and he was kind enough to look at the films and discuss her situation. They will be happy to provide a formal consultation if her clinical situation worsens. Her Foley catheter probably can be discontinued at this point.  LOS: 5 days   Kathryn [name] S 04/22/2011, 12:38 PM

## 2011-04-22 NOTE — Progress Notes (Signed)
CSW came by to check on Pt. She was getting ready to work with PT. FL2 in chart to sign. Plans to return to [facility] at d/c. CSW will check in on Pt in the am. Vennie Homans, LCSWA 04/22/2011 11:10 AM [phone]

## 2011-04-22 NOTE — Progress Notes (Signed)
Subjective: Did better overnight. No abd pain this AM. No breathing issues  Objective: Vital signs in last 24 hours: Temp:  [97.1 F (36.2 C)-98.8 F (37.1 C)] 98.7 F (37.1 C) (02/14 0555) Pulse Rate:  [68-108] 86  (02/14 0555) Resp:  [16-18] 16  (02/14 0555) BP: (101-172)/(70-91) 148/71 mmHg (02/14 0555) SpO2:  [92 %-94 %] 92 % (02/14 0555)  Intake/Output from previous day: 02/13 0701 - 02/14 0700 In: 600 [I.V.:600] Out: 1800 [Urine:1800] Intake/Output this shift:    General: alert  no distress non toxic, anicteric, oral membranes moist. Lungs clear. Ht IR/IR with murmur. Abdomen obese soft sl tender but no rebound. Hypoactive BS. Alert awake, mentating well, no tremor   Lab Results   [facility] 04/22/11 0315 04/21/11 0335  WBC 8.7 9.3  RBC 2.92* 2.94*  HGB 9.2* 9.0*  HCT 26.8* 27.4*  MCV 91.8 93.2  MCH 31.5 30.6  RDW 16.3* 16.6*  PLT 209 205    Basename 04/22/11 0315 04/21/11 0335  NA 130* 126*  K 3.9 3.9  CL 100 97  CO2 19 22  GLUCOSE 158* 136*  BUN 32* 34*  CREATININE 1.52* 1.59*  CALCIUM 8.2* 8.5    Studies/Results: Ct Abdomen Pelvis Wo Contrast  04/21/2011  *RADIOLOGY REPORT*  Clinical Data: Evaluate for retroperitoneal gas.  History of abdominal pain and hematuria.  CT ABDOMEN AND PELVIS WITHOUT CONTRAST  Technique:  Multidetector CT imaging of the abdomen and pelvis was performed following the standard protocol without intravenous contrast.  Comparison: CT abdomen 04/19/2011  Findings: The upper abdomen was imaged twice due to excessive patient motion.  Lung bases are clear with exception of small pleural effusions.  Again noted is a small amount of gas adjacent to the mid right ureter.  The amount gas has slightly decreased since the prior examination.  There is mild-moderate right hydroureteronephrosis with soft tissue edema surrounding the mid right ureter and similar to the prior examination. Slightly increased distention of the right renal pelvis compared  to the prior examination.  There is a Foley catheter within the urinary bladder.  Again noted is mild dilatation of the left renal collecting system.  There is slightly decreased distention of the left renal collecting system compared to the prior examination.  Stable appearance of the uterus and adnexal structures.  The right adnexal structures are in the same region as the edematous changes around the right ureter.  Extensive edema in the presacral region. There is diffuse mesenteric edema and perirenal edema.  No evidence for kidney stones.  Limited evaluation of the intra-abdominal solid organs due to the lack of intravenous contrast.  No gross abnormalities in the liver, spleen or pancreatic tissue.  Stable fullness of the adrenal tissue. Coronary arteries are heavily calcified.  Diffuse degenerative bone changes.  IMPRESSION: There is persistent dilatation of the renal collecting systems bilaterally.  There is now slightly increased dilatation of the right compared to the left.  No evidence for obstructing stones.  There is persistent gas in the region of the right mid ureter, but the etiology of the air remains indeterminate.   The edema and inflammatory changes centered around the the right ureter is concerning for an inflammatory or infectious etiology.  Small pleural effusions.  Edematous changes throughout the abdomen and pelvis.  Original Report Authenticated By: Richarda Overlie, M.D.    Scheduled Meds:   . cefTRIAXone (ROCEPHIN)  IV  1 g Intravenous Q24H  . darifenacin  7.5 mg Oral Daily  . ferrous  sulfate  325 mg Oral Q breakfast  . insulin aspart  0-15 Units Subcutaneous TID WC  . insulin aspart  5 Units Subcutaneous TID WC  . insulin glargine  30 Units Subcutaneous QHS  . losartan  100 mg Oral Daily  . metoprolol tartrate  25 mg Oral BID  . multivitamin  1 tablet Oral Daily  . pantoprazole  40 mg Oral Daily  . polyethylene glycol  17 g Oral Daily  . protein supplement  6 g Oral TID WC  .  rivaroxaban  15 mg Oral Daily  . rosuvastatin  20 mg Oral Daily  . senna-docusate  2 tablet Oral QHS   Continuous Infusions:   . sodium chloride 1,000 mL (04/21/11 1006)   PRN Meds:acetaminophen, hydrALAZINE, HYDROcodone-acetaminophen, magic mouthwash, menthol-cetylpyridinium, morphine, ondansetron (ZOFRAN) IV, ondansetron, zolpidem  Assessment/Plan:  PYELONEPHRITIS/SEPSIS FROM A URINARY SOURCE: Klebsiella in blood and urine. Now on rocephin WBC sl better at 8.7 No fever of note.  ABDOMINAL PAIN/VOMITING : CT actually better overall but these are still urologic issues that conceivably could be causing pain  RENAL INSUFFICIENCY: sl better today at 1.52 HYDROURETER: Seen this AM by urology. Air seen outside the ureter. ? Some disruption. still has hydronephrosis bilat  ATRIAL FIBRILLATION: Stable rate  DM 2: FBS fair at 158  HYPERTENSION: BP reasonable overall  ANEMIA: About the same at 9.2 HYPONATREMIA: ? Due to nausea. Better at 130 CONSTIPATION: Rx today  LOS: 5 days   Kathryn [name] [name] 04/22/2011, 9:23 AM

## 2011-04-22 NOTE — Progress Notes (Signed)
Received call from lab with results of blood culture from 04/17/11 of gram negative rods. Contacted MD; MD already aware. No new orders given

## 2011-04-22 NOTE — Progress Notes (Signed)
Physical Therapy Treatment Patient Details Name: Kathryn [name] MRN: [id number] DOB: 04/04/1933 Today's Date: 04/22/2011  PT Assessment/Plan  PT - Assessment/Plan Comments on Treatment Session: pt needs continued activity and OOB. she also needs to avoid use of pillows under knees to prevent progression of knee contractures which will further impair mobility  Pt was able to do sit to stand today and should be able to use STEDY to chair/ BSC if needed PT Plan: Discharge plan remains appropriate PT Frequency: Min 3X/week Follow Up Recommendations: Skilled nursing facility Equipment Recommended: Defer to next venue PT Goals  Acute Rehab PT Goals PT Goal: Supine/Side to Sit - Progress: Not progressing PT Goal: Sit to Stand - Progress: Not progressing PT Goal: Ambulate - Progress: Not progressing  PT Treatment Precautions/Restrictions  Precautions Precautions: Fall Required Braces or Orthoses: No Restrictions Weight Bearing Restrictions: No Other Position/Activity Restrictions: pt lacks terminal extensionof both knees and needs to keep heels off bed. She may benefit from prevalon boot to provide pressure relief and eliminate pillow under knees Mobility (including Balance) Bed Mobility Bed Mobility: Yes Rolling Right: 3: Mod assist;With rail Rolling Right Details (indicate cue type and reason): pt needs assist o position leg and push hip over Rolling Left: 3: Mod assist Rolling Left Details (indicate cue type and reason): pt needs assit to position leg Supine to Sit: 3: Mod assist;HOB elevated (Comment degrees) (HOB elevated to max amount) Supine to Sit Details (indicate cue type and reason): pt iniated moving legs and scooting.  Needs scooting around on pad to get to EOB Sit to Supine: 1: +2 Total assist;HOB flat (pt ~50%) Sit to Supine - Details (indicate cue type and reason): assit to lower shoulders and to raise legs up onto bed Transfers Transfers: Yes Sit to Stand: 3: Mod  assist;From elevated surface;With upper extremity assist Sit to Stand Details (indicate cue type and reason): pt keeps hips and knees bent.  Unable to get heels to floor Stand to Sit: 3: Mod assist Stand to Sit Details: needs assist to control descent Ambulation/Gait Ambulation/Gait: No (pt unable to weight shift and step) Stairs: No Wheelchair Mobility Wheelchair Mobility: No  Posture/Postural Control Posture/Postural Control: Postural limitations Postural Limitations: pt obese, hips, knees and ankles with decreased ROM Balance Balance Assessed: No Static Sitting Balance Static Sitting - Balance Support: Feet supported;Bilateral upper extremity supported Static Sitting - Level of Assistance: 6: Modified independent (Device/Increase time) Static Sitting - Comment/# of Minutes: 7 Exercise  General Exercises - Lower Extremity Ankle Circles/Pumps: AROM;Both;5 reps;Supine Quad Sets: AROM;5 reps;Both;Supine Gluteal Sets: AROM;Both;5 reps;Supine Long Arc Quad: AROM;Both;5 reps;Seated Hip ABduction/ADduction: AROM;Both;5 reps;Seated Hip Flexion/Marching: AROM;5 reps;Seated Other Exercises Other Exercises: sitting core exercises: trunke extension, relaxation, alternating taking one arm off of RW End of Session PT - End of Session Activity Tolerance: Patient limited by fatigue;Other (comment) (pt reports she had difficult day yesterday) Patient left: in bed;with call bell in reach Nurse Communication: Other (comment) (please remove pillows from under knees but still float heels) General Behavior During Session: Other (comment) (needs cues to stay focused and move forward with task) Cognition: [facility] for tasks performed  Donnetta Hail 04/22/2011, 12:02 PM

## 2011-04-22 NOTE — Consult Note (Signed)
ID f/u Slowly improving from bacteremic Kelbsiella pyelonehritis. Day #3 of ceftriaxone with total of 6 days.  Exam shows mild flank and abd tenderness but she says that is better. Afebrile and WBC resolving. She can be switched to oral antibiotic and quinolone or cephlexin would be fine for total of 14 day course. Agree getting CT abdomen with oral contrast to be done before discharge. Lina Sayre

## 2011-04-22 NOTE — Evaluation (Signed)
Occupational Therapy Evaluation Patient Details Name: Kathryn [name] MRN: [id number] DOB: [birth year] Today's Date: 04/22/2011  Problem List:  Patient Active Problem List  Diagnoses  . DM  . HYPERCHOLESTEROLEMIA  IIA  . MACULAR DEGENERATION  . HYPERTENSION, UNSPECIFIED  . Atrial fibrillation  . OSTEOARTHRITIS  . OSTEOPOROSIS  . EDEMA  . Anemia  . Pyelonephritis, acute  . Renal insufficiency  . Hyponatremia  . Obesity  . Bacteremia due to Klebsiella pneumoniae    Past Medical History:  Past Medical History  Diagnosis Date  . Osteoporosis   . DM (diabetes mellitus)   . HTN (hypertension)     unspecified  . Atrial fibrillation   . Macular degeneration   . Osteoarthritis   . Edema   . Anemia   . Hydronephrosis     left  . UTI (lower urinary tract infection)    Past Surgical History:  Past Surgical History  Procedure Date  . Replacement total knee 08/2007    bilateral  . Appendectomy   . Cholecystectomy   . Colonoscopy   . Upper gastrointestinal endoscopy     OT Assessment/Plan/Recommendation OT Assessment Clinical Impression Statement: Pt will benefit from skilled OT services to increase her independence back to PLOF for return to SNF.  OT Recommendation/Assessment: Patient will need skilled OT in the acute care venue OT Problem List: Decreased strength;Decreased range of motion;Decreased knowledge of use of DME or AE OT Therapy Diagnosis : Generalized weakness OT Plan OT Frequency: Min 1X/week OT Treatment/Interventions: Self-care/ADL training;Therapeutic activities;DME and/or AE instruction;Patient/family education OT Recommendation Follow Up Recommendations: Skilled nursing facility Equipment Recommended: Defer to next venue Individuals Consulted Consulted and Agree with Results and Recommendations: Patient OT Goals Acute Rehab OT Goals OT Goal Formulation: With patient Time For Goal Achievement: 2 weeks ADL Goals Pt Will Perform Grooming: with  supervision;Unsupported;Other (comment) (3 tasks EOB) ADL Goal: Grooming - Progress: Goal set today Pt Will Perform Upper Body Bathing: with set-up;Sitting, edge of bed;Sitting, chair;Unsupported ADL Goal: Upper Body Bathing - Progress: Goal set today Pt Will Perform Lower Body Bathing: with adaptive equipment;Sit to stand from bed;with mod assist ADL Goal: Lower Body Bathing - Progress: Goal set today Pt Will Perform Upper Body Dressing: with set-up;Sitting, bed;Unsupported ADL Goal: Upper Body Dressing - Progress: Goal set today Pt Will Transfer to Toilet: with mod assist;Stand pivot transfer;with DME;3-in-1 ADL Goal: Toilet Transfer - Progress: Goal set today Pt Will Perform Toileting - Clothing Manipulation: with mod assist;Standing ADL Goal: Toileting - Clothing Manipulation - Progress: Goal set today  OT Evaluation Precautions/Restrictions  Precautions Precautions: Fall Required Braces or Orthoses: No Restrictions Weight Bearing Restrictions: No Other Position/Activity Restrictions: pt lacks terminal extensionof both knees and needs to keep heels off bed. She may benefit from prevalon boot to provide pressure relief and eliminate pillow under knees Prior Functioning Home Living Lives With: Spouse Receives Help From: Personal care attendant Type of Home: Skilled Nursing Facility Home Layout: One level Home Access: Level entry Home Adaptive Equipment: Walker - rolling;Wheelchair - manual Additional Comments: pt had been receiving therapy at [facility] Prior Function Level of Independence: Requires assistive device for independence;Needs assistance with tranfers;Needs assistance with gait;Needs assistance with ADLs Comments: pt reports she was receving OT at SNF and was working on bathing and dressing tasks. She was unable to complete LB dressing and was only able to do mostly UB tasks. She wears gowns/dresses and depends mostly. She could transfer with a grab bar from   wheelchair to toilet  with supervision but needed assist to manage clothing.  ADL ADL Eating/Feeding: Simulated;Set up Where Assessed - Eating/Feeding: Bed level Grooming: Simulated;Set up;Wash/dry face Where Assessed - Grooming: Supine, head of bed up Upper Body Bathing: Simulated;Chest;Right arm;Left arm;Abdomen;Minimal assistance Where Assessed - Upper Body Bathing: Sitting, bed;Unsupported Lower Body Bathing: +2 Total assistance;Comment for patient % Lower Body Bathing Details (indicate cue type and reason): pt 30% Where Assessed - Lower Body Bathing: Sit to stand from bed Upper Body Dressing: Simulated;Minimal assistance Where Assessed - Upper Body Dressing: Sitting, bed;Unsupported Lower Body Dressing: +2 Total assistance;Comment for patient %;Simulated Lower Body Dressing Details (indicate cue type and reason): pt 0% Where Assessed - Lower Body Dressing: Sit to stand from bed Toilet Transfer: +2 Total assistance Toilet Transfer Details (indicate cue type and reason): sit to stand only. several trials. +2 pt 50% needs assist to lift hips off bed and also for trunk assist to stand up erect and bring shoulders back.  Toileting - Clothing Manipulation: +2 Total assistance Toileting - Clothing Manipulation Details (indicate cue type and reason): pt 0% Where Assessed - Glass blower/designer Manipulation: Standing Toileting - Hygiene: +2 Total assistance;Comment for patient % Toileting - Hygiene Details (indicate cue type and reason): pt 0% Where Assessed - Toileting Hygiene: Standing Tub/Shower Transfer: Not assessed Equipment Used: Standard walker ADL Comments: cotx with PT. Pt stood several trials but is very anxious with standing. Pt limited with many of her ADLs at this time as she is weak and deconditioned.  Vision/Perception    Cognition Cognition Arousal/Alertness: Awake/alert Overall Cognitive Status: Appears within functional limits for tasks  assessed Sensation/Coordination Sensation Light Touch: Appears Intact Extremity Assessment RUE Assessment RUE Assessment: Exceptions to [facility] RUE AROM (degrees) Right Shoulder Flexion  0-170: 150 Degrees (grossly 3+-5/5 elbow distal.) LUE Assessment LUE Assessment: Exceptions to WFL LUE AROM (degrees) Left Shoulder Flexion  0-170: 90 Degrees (elbow distal grossly 3+-4/5) Mobility  Bed Mobility Bed Mobility: Yes Rolling Right: 3: Mod assist;With rail Rolling Right Details (indicate cue type and reason): pt needs assist o position leg and push hip over Rolling Left: 3: Mod assist;With rail Rolling Left Details (indicate cue type and reason): pt needs assit to position leg Supine to Sit: 3: Mod assist;HOB elevated (Comment degrees) Supine to Sit Details (indicate cue type and reason): pt iniated moving legs and scooting.  Needs scooting around on pad to get to EOB Sit to Supine: 1: +2 Total assist;HOB flat Sit to Supine - Details (indicate cue type and reason): pt 50% Transfers Sit to Stand: 1: +2 Total assist;Patient percentage (comment) Sit to Stand Details (indicate cue type and reason): 50% Stand to Sit: 3: Mod assist Stand to Sit Details: needs assist to control descent End of Session OT - End of Session Equipment Utilized During Treatment: Other (comment) (SW) Activity Tolerance: Patient tolerated treatment well Patient left: in bed;with call bell in reach General Behavior During Session: [facility] for tasks performed Cognition: [facility] for tasks performed   Lennox Laity [phone] 04/22/2011, 12:58 PM

## 2011-04-23 DIAGNOSIS — N390 Urinary tract infection, site not specified: Secondary | ICD-10-CM

## 2011-04-23 DIAGNOSIS — B9689 Other specified bacterial agents as the cause of diseases classified elsewhere: Secondary | ICD-10-CM

## 2011-04-23 LAB — GLUCOSE, CAPILLARY
Glucose-Capillary: 120 mg/dL — ABNORMAL HIGH (ref 70–99)
Glucose-Capillary: 143 mg/dL — ABNORMAL HIGH (ref 70–99)

## 2011-04-23 LAB — COMPREHENSIVE METABOLIC PANEL
ALT: 30 U/L (ref 0–35)
Alkaline Phosphatase: 331 U/L — ABNORMAL HIGH (ref 39–117)
BUN: 30 mg/dL — ABNORMAL HIGH (ref 6–23)
Chloride: 102 mEq/L (ref 96–112)
GFR calc Af Amer: 33 mL/min — ABNORMAL LOW (ref >90)
Glucose, Bld: 136 mg/dL — ABNORMAL HIGH (ref 70–99)
Potassium: 4 mEq/L (ref 3.5–5.1)
Sodium: 130 mEq/L — ABNORMAL LOW (ref 135–145)
Total Bilirubin: 0.3 mg/dL (ref 0.3–1.2)
Total Protein: 5.3 g/dL — ABNORMAL LOW (ref 6.0–8.3)

## 2011-04-23 LAB — CBC
HCT: 27.3 % — ABNORMAL LOW (ref 36.0–46.0)
Hemoglobin: 9.1 g/dL — ABNORMAL LOW (ref 12.0–15.0)
RBC: 2.93 MIL/uL — ABNORMAL LOW (ref 3.87–5.11)
WBC: 7.7 10*3/uL (ref 4.0–10.5)

## 2011-04-23 NOTE — Progress Notes (Signed)
Subjective: Had a rough night. Threw up a few times. Finally had a BM at 2 AM. Stomach is quite sore. Breathing fair.    Objective: Vital signs in last 24 hours: Temp:  [98.1 F (36.7 C)-98.8 F (37.1 C)] 98.3 F (36.8 C) (02/15 0603) Pulse Rate:  [72-92] 78  (02/15 0603) Resp:  [18-20] 18  (02/15 0603) BP: (120-155)/(51-79) 149/76 mmHg (02/15 0603) SpO2:  [94 %-97 %] 97 % (02/15 0603)  Intake/Output from previous day: 02/14 0701 - 02/15 0700 In: 600 [P.O.:600] Out: 2700 [Urine:2700] Intake/Output this shift:    General: pale no distress non toxic, anicteric, oral membranes moist. Lungs clear. Ht IR/IR with murmur. Abdomen obese soft diffuse mild tenderness. Hypoactive BS. Alert awake, mentating well, no tremor      Lab Results   Basename 04/23/11 0330 04/22/11 0315  WBC 7.7 8.7  RBC 2.93* 2.92*  HGB 9.1* 9.2*  HCT 27.3* 26.8*  MCV 93.2 91.8  MCH 31.1 31.5  RDW 16.4* 16.3*  PLT 240 209    Basename 04/23/11 0330 04/22/11 0315  NA 130* 130*  K 4.0 3.9  CL 102 100  CO2 21 19  GLUCOSE 136* 158*  BUN 30* 32*  CREATININE 1.67* 1.52*  CALCIUM 8.4 8.2*    Studies/Results: Ct Abdomen Pelvis Wo Contrast  04/21/2011  *RADIOLOGY REPORT*  Clinical Data: Evaluate for retroperitoneal gas.  History of abdominal pain and hematuria.  CT ABDOMEN AND PELVIS WITHOUT CONTRAST  Technique:  Multidetector CT imaging of the abdomen and pelvis was performed following the standard protocol without intravenous contrast.  Comparison: CT abdomen 04/19/2011  Findings: The upper abdomen was imaged twice due to excessive patient motion.  Lung bases are clear with exception of small pleural effusions.  Again noted is a small amount of gas adjacent to the mid right ureter.  The amount gas has slightly decreased since the prior examination.  There is mild-moderate right hydroureteronephrosis with soft tissue edema surrounding the mid right ureter and similar to the prior examination. Slightly  increased distention of the right renal pelvis compared to the prior examination.  There is a Foley catheter within the urinary bladder.  Again noted is mild dilatation of the left renal collecting system.  There is slightly decreased distention of the left renal collecting system compared to the prior examination.  Stable appearance of the uterus and adnexal structures.  The right adnexal structures are in the same region as the edematous changes around the right ureter.  Extensive edema in the presacral region. There is diffuse mesenteric edema and perirenal edema.  No evidence for kidney stones.  Limited evaluation of the intra-abdominal solid organs due to the lack of intravenous contrast.  No gross abnormalities in the liver, spleen or pancreatic tissue.  Stable fullness of the adrenal tissue. Coronary arteries are heavily calcified.  Diffuse degenerative bone changes.  IMPRESSION: There is persistent dilatation of the renal collecting systems bilaterally.  There is now slightly increased dilatation of the right compared to the left.  No evidence for obstructing stones.  There is persistent gas in the region of the right mid ureter, but the etiology of the air remains indeterminate.   The edema and inflammatory changes centered around the the right ureter is concerning for an inflammatory or infectious etiology.  Small pleural effusions.  Edematous changes throughout the abdomen and pelvis.  Original Report Authenticated By: Richarda Overlie, M.D.    Scheduled Meds:   . cefTRIAXone (ROCEPHIN)  IV  1 g  Intravenous Q24H  . darifenacin  7.5 mg Oral Daily  . ferrous sulfate  325 mg Oral Q breakfast  . insulin aspart  0-15 Units Subcutaneous TID WC  . insulin aspart  5 Units Subcutaneous TID WC  . insulin glargine  30 Units Subcutaneous QHS  . losartan  100 mg Oral Daily  . metoprolol tartrate  25 mg Oral BID  . multivitamin  1 tablet Oral Daily  . pantoprazole  40 mg Oral Daily  . polyethylene glycol  17 g  Oral BID  . protein supplement  6 g Oral TID WC  . rivaroxaban  15 mg Oral Daily  . rosuvastatin  20 mg Oral Daily  . senna-docusate  2 tablet Oral QHS  . DISCONTD: polyethylene glycol  17 g Oral Daily  . DISCONTD: polyethylene glycol-electrolytes  4,000 mL Oral Once   Continuous Infusions:   . sodium chloride 75 mL/hr at 04/22/11 0830   PRN Meds:acetaminophen, bisacodyl, hydrALAZINE, HYDROcodone-acetaminophen, magic mouthwash, menthol-cetylpyridinium, morphine, ondansetron (ZOFRAN) IV, ondansetron, zolpidem  Assessment/Plan:  PYELONEPHRITIS/SEPSIS FROM A URINARY SOURCE: Klebsiella in blood and urine. Now on rocephin. No fever. Continue IV Abx for now. Needs 14 days overall  ABDOMINAL PAIN/VOMITING : Worse overnight. Perhaps the improvement in the constipation may help. Dr Marina Goodell did EGD and Colon quite recently. Exam does not suggest obstruction. May need more XR.  RENAL INSUFFICIENCY: sl worse at 1.67 HYDROURETER: Seen this AM by urology. Air seen outside the ureter. ? Some disruption. still has hydronephrosis bilat . Will let urology decide on need for more testing/stents, etc ATRIAL FIBRILLATION: Stable rate  DM 2: FBS better at 136HYPERTENSION: BP reasonable overall  ANEMIA: About the same at 9.1  HYPONATREMIA: ? Due to nausea. Better at 130  CONSTIPATION: Rx has helped.Probably still not "cleaned out" DISP: Will be going back to SNF   LOS: 6 days   Kathryn [name] [name] 04/23/2011, 8:39 AM

## 2011-04-23 NOTE — Progress Notes (Signed)
CSw checked on Pt, not ready for d/c to SNF. Plans to return to [facility] when ready. Pt did bed hold and can return. Need FL2 signed to help with return to SNF.  Vennie Homans, [place] 04/23/2011 9:55 AM [phone]

## 2011-04-23 NOTE — Consult Note (Signed)
INFECTIOUS DISEASE PROGRESS NOTE    Date of Admission:  04/17/2011   Current antibiotics:        ceftriaxone         Problem List:        Principal Problem:  *Pyelonephritis, acute Active Problems:  DM  HYPERTENSION, UNSPECIFIED  Atrial fibrillation  Anemia  Renal insufficiency  Hyponatremia  Obesity  Bacteremia due to Klebsiella pneumoniae   Medication List:    . cefTRIAXone (ROCEPHIN)  IV  1 g Intravenous Q24H  . darifenacin  7.5 mg Oral Daily  . ferrous sulfate  325 mg Oral Q breakfast  . insulin aspart  0-15 Units Subcutaneous TID WC  . insulin aspart  5 Units Subcutaneous TID WC  . insulin glargine  30 Units Subcutaneous QHS  . losartan  100 mg Oral Daily  . metoprolol tartrate  25 mg Oral BID  . multivitamin  1 tablet Oral Daily  . pantoprazole  40 mg Oral Daily  . polyethylene glycol  17 g Oral BID  . protein supplement  6 g Oral TID WC  . rivaroxaban  15 mg Oral Daily  . rosuvastatin  20 mg Oral Daily  . senna-docusate  2 tablet Oral QHS  . DISCONTD: polyethylene glycol-electrolytes  4,000 mL Oral Once    Subjective:  Objective: Temp:  [98.3 F (36.8 C)-98.8 F (37.1 C)] 98.3 F (36.8 C) (02/15 0603) Pulse Rate:  [72-80] 78  (02/15 0603) Resp:  [18] 18  (02/15 0603) BP: (120-149)/(51-76) 149/76 mmHg (02/15 0603) SpO2:  [94 %-97 %] 97 % (02/15 0603)  General: Skin:  Lungs: Cor:  Abdomen  Lab Results Lab Results  Component Value Date   WBC 7.7 04/23/2011   HGB 9.1* 04/23/2011   HCT 27.3* 04/23/2011   MCV 93.2 04/23/2011   PLT 240 04/23/2011    Lab Results  Component Value Date   CREATININE 1.67* 04/23/2011   BUN 30* 04/23/2011   NA 130* 04/23/2011   K 4.0 04/23/2011   CL 102 04/23/2011   CO2 21 04/23/2011    Lab Results  Component Value Date   ALT 30 04/23/2011   AST 31 04/23/2011   ALKPHOS 331* 04/23/2011   BILITOT 0.3 04/23/2011      Microbiology: Recent Results (from the past 240 hour(s))  CULTURE, BLOOD (ROUTINE X 2)     Status: Normal  (Preliminary result)   Collection Time   04/17/11  6:45 PM      Component Value Range Status Comment   Specimen Description BLOOD RIGHT HAND   Final    Special Requests BOTTLES DRAWN AEROBIC AND ANAEROBIC 5 CC EACH   Final    Culture  Setup Time [id number]   Final    Culture     Final    Value: GRAM NEGATIVE RODS     Note: Gram Stain Report Called to,Read Back By and Verified With: [facility] MILLER 04/22/11 1535 BY SMITHERSJ   Report Status PENDING   Incomplete   URINE CULTURE     Status: Normal   Collection Time   04/17/11  7:31 PM      Component Value Range Status Comment   Specimen Description URINE, CATHETERIZED   Final    Special Requests NONE   Final    Culture  Setup Time [id number]   Final    Colony Count 85,000 COLONIES/ML   Final    Culture KLEBSIELLA PNEUMONIAE   Final    Report Status 04/19/2011 FINAL   Final  Organism ID, Bacteria KLEBSIELLA PNEUMONIAE   Final   CULTURE, BLOOD (ROUTINE X 2)     Status: Normal   Collection Time   04/17/11  9:50 PM      Component Value Range Status Comment   Specimen Description BLOOD LEFT ARM   Final    Special Requests BOTTLES DRAWN AEROBIC AND ANAEROBIC 5CC   Final    Culture  Setup Time [id number]   Final    Culture     Final    Value: KLEBSIELLA PNEUMONIAE     Note: CRITICAL RESULT CALLED TO, READ BACK BY AND VERIFIED WITH: DEBRA ANDERSON @ 840PM BY VINCJ 04/18/11   Report Status 04/20/2011 FINAL   Final    Organism ID, Bacteria KLEBSIELLA PNEUMONIAE   Final   MRSA PCR SCREENING     Status: Normal   Collection Time   04/18/11 12:17 AM      Component Value Range Status Comment   MRSA by PCR NEGATIVE  NEGATIVE  Final   CULTURE, BLOOD (ROUTINE X 2)     Status: Normal (Preliminary result)   Collection Time   04/19/11  9:50 AM      Component Value Range Status Comment   Specimen Description BLOOD LEFT HAND   Final    Special Requests BOTTLES DRAWN AEROBIC AND ANAEROBIC 10CC   Final    Culture  Setup Time [id number]   Final     Culture     Final    Value:        BLOOD CULTURE RECEIVED NO GROWTH TO DATE CULTURE WILL BE HELD FOR 5 DAYS BEFORE ISSUING A FINAL NEGATIVE REPORT   Report Status PENDING   Incomplete   CULTURE, BLOOD (ROUTINE X 2)     Status: Normal (Preliminary result)   Collection Time   04/19/11 10:00 AM      Component Value Range Status Comment   Specimen Description BLOOD LEFT ARM   Final    Special Requests BOTTLES DRAWN AEROBIC AND ANAEROBIC 10CC   Final    Culture  Setup Time [id number]   Final    Culture     Final    Value:        BLOOD CULTURE RECEIVED NO GROWTH TO DATE CULTURE WILL BE HELD FOR 5 DAYS BEFORE ISSUING A FINAL NEGATIVE REPORT   Report Status PENDING   Incomplete     Studies/Results: No results found.  Assessment: Slowly resolving but with GI upset  Plan:1. Recommend schage to oral quinolone,moxifloacin or levaquin when N&V resolve to complete 14 days RX. Will see again if requested.   Lina Sayre 04/23/2011 12:03 PM

## 2011-04-24 ENCOUNTER — Encounter (HOSPITAL_COMMUNITY): Payer: Self-pay | Admitting: Surgery

## 2011-04-24 DIAGNOSIS — R109 Unspecified abdominal pain: Secondary | ICD-10-CM

## 2011-04-24 LAB — COMPREHENSIVE METABOLIC PANEL
ALT: 26 U/L (ref 0–35)
AST: 33 U/L (ref 0–37)
Albumin: 1.7 g/dL — ABNORMAL LOW (ref 3.5–5.2)
Alkaline Phosphatase: 345 U/L — ABNORMAL HIGH (ref 39–117)
Calcium: 8.4 mg/dL (ref 8.4–10.5)
Potassium: 4.3 mEq/L (ref 3.5–5.1)
Sodium: 128 mEq/L — ABNORMAL LOW (ref 135–145)
Total Protein: 5.4 g/dL — ABNORMAL LOW (ref 6.0–8.3)

## 2011-04-24 LAB — CULTURE, BLOOD (ROUTINE X 2): Culture  Setup Time: 201302100235

## 2011-04-24 LAB — GLUCOSE, CAPILLARY
Glucose-Capillary: 172 mg/dL — ABNORMAL HIGH (ref 70–99)
Glucose-Capillary: 133 mg/dL — ABNORMAL HIGH (ref 70–99)

## 2011-04-24 LAB — CBC
Hemoglobin: 9.1 g/dL — ABNORMAL LOW (ref 12.0–15.0)
MCH: 30.4 pg (ref 26.0–34.0)
MCHC: 32.4 g/dL (ref 30.0–36.0)
Platelets: 254 10*3/uL (ref 150–400)

## 2011-04-24 NOTE — Consult Note (Signed)
Chief Complaint:  Back pain onset about a week ago, admitted and treated for pyelonephritis  History of Present Illness:  Kathryn [name] is an 78 y.o. female has been at [facility] for about one week with treatment for pyelonephritis. I interviewed her at the request of Dr. Jarold Motto. Her history is remarkable in that she had severe back pain and then complained of some abdominal pain particularly with coughing. I reviewed her recent CT scan again at the behest of Dr. Jarold Motto regarding some apparent gas in the retroperitoneum. To my is and she does have some edema around her right kidney and calyceal dilatation in that region. I don't see any evidence of right-sided diverticulitis or duodenitis or perforation that could be contributing to this process. He does have hydronephrosis but without a kidney stone. I discussed surgery with her but she certainly wish to avoid that if at all possible.  Past Medical History  Diagnosis Date  . Osteoporosis   . DM (diabetes mellitus)   . HTN (hypertension)     unspecified  . Atrial fibrillation   . Macular degeneration   . Osteoarthritis   . Edema   . Anemia   . Hydronephrosis     left  . UTI (lower urinary tract infection)     Past Surgical History  Procedure Date  . Replacement total knee 08/2007    bilateral  . Appendectomy   . Cholecystectomy   . Colonoscopy   . Upper gastrointestinal endoscopy     Current Facility-Administered Medications  Medication Dose Route Frequency Provider Last Rate Last Dose  . 0.9 %  sodium chloride infusion   Intravenous Continuous Garlan Fillers, MD 75 mL/hr at 04/24/11 0627 75 mL/hr at 04/24/11 0454  . acetaminophen (TYLENOL) tablet 650 mg  650 mg Oral Q6H PRN Ravisankar R Avva, MD   650 mg at 04/21/11 1336  . bisacodyl (DULCOLAX) suppository 10 mg  10 mg Rectal Daily PRN Julian Hy, MD   10 mg at 04/22/11 1243  . cefTRIAXone (ROCEPHIN) 1 g in dextrose 5 % 50 mL IVPB  1 g Intravenous  Q24H Cliffton Asters, MD   1 g at 04/23/11 1422  . darifenacin (ENABLEX) 24 hr tablet 7.5 mg  7.5 mg Oral Daily Ravisankar R Avva, MD   7.5 mg at 04/24/11 0900  . ferrous sulfate tablet 325 mg  325 mg Oral Q breakfast Ravisankar R Avva, MD   325 mg at 04/24/11 0900  . hydrALAZINE (APRESOLINE) injection 10 mg  10 mg Intravenous Q6H PRN Kari Baars, MD   10 mg at 04/21/11 2344  . HYDROcodone-acetaminophen (NORCO) 5-325 MG per tablet 1 tablet  1 tablet Oral Q6H PRN Ravisankar R Avva, MD   1 tablet at 04/24/11 0410  . insulin aspart (novoLOG) injection 0-15 Units  0-15 Units Subcutaneous TID WC Ravisankar R Avva, MD   2 Units at 04/23/11 1819  . insulin aspart (novoLOG) injection 5 Units  5 Units Subcutaneous TID WC Ravisankar R Avva, MD   5 Units at 04/24/11 0900  . insulin glargine (LANTUS) injection 30 Units  30 Units Subcutaneous QHS Ravisankar R Avva, MD   30 Units at 04/23/11 2153  . magic mouthwash  5 mL Oral Q6H PRN Julian Hy, MD   5 mL at 04/20/11 2101  . menthol-cetylpyridinium (CEPACOL) lozenge 3 mg  1 lozenge Oral PRN Julian Hy, MD   3 mg at 04/21/11 1338  . metoprolol tartrate (LOPRESSOR) tablet  25 mg  25 mg Oral BID Ravisankar R Avva, MD   25 mg at 04/24/11 0936  . morphine 4 MG/ML injection 4 mg  4 mg Intravenous Q2H PRN Ravisankar R Avva, MD   4 mg at 04/24/11 0955  . multivitamin (RENA-VIT) tablet 1 tablet  1 tablet Oral Daily Ravisankar R Avva, MD   1 tablet at 04/24/11 0936  . ondansetron (ZOFRAN) injection 4 mg  4 mg Intravenous Q6H PRN Ravisankar R Avva, MD   4 mg at 04/23/11 1031  . ondansetron (ZOFRAN) tablet 4 mg  4 mg Oral Q6H PRN Ravisankar R Avva, MD      . pantoprazole (PROTONIX) EC tablet 40 mg  40 mg Oral Daily Ravisankar R Avva, MD   40 mg at 04/24/11 0937  . polyethylene glycol (MIRALAX / GLYCOLAX) packet 17 g  17 g Oral BID Julian Hy, MD   17 g at 04/24/11 1000  . protein supplement (RESOURCE BENEPROTEIN) powder packet 6 g  6 g Oral TID WC  Marshall Cork, RD   6 g at 04/23/11 1820  . rivaroxaban (XARELTO) tablet 15 mg  15 mg Oral Daily Ravisankar R Avva, MD   15 mg at 04/24/11 0900  . rosuvastatin (CRESTOR) tablet 20 mg  20 mg Oral Daily Ravisankar R Avva, MD   20 mg at 04/24/11 0937  . senna-docusate (Senokot-S) tablet 2 tablet  2 tablet Oral QHS Ravisankar R Avva, MD   2 tablet at 04/23/11 2153  . zolpidem (AMBIEN) tablet 5 mg  5 mg Oral QHS PRN Ravisankar R Avva, MD   5 mg at 04/23/11 2218  . DISCONTD: losartan (COZAAR) tablet 100 mg  100 mg Oral Daily Ravisankar R Avva, MD   100 mg at 04/23/11 0941   Facility-Administered Medications Ordered in Other Encounters  Medication Dose Route Frequency Provider Last Rate Last Dose  . DISCONTD: 0.9 %  sodium chloride infusion   Intravenous Continuous Eli Hose, MD   50 mL at 04/09/11 1620   Celebrex; Nsaids; and Sulfa antibiotics Family History  Problem Relation Age of Onset  . Diabetes Father   . Atrial fibrillation Father   . Breast cancer Paternal Grandmother   . Heart disease Mother    Social History:   reports that she has never smoked. She has never used smokeless tobacco. She reports that she does not drink alcohol or use illicit drugs.   REVIEW OF SYSTEMS - PERTINENT POSITIVES ONLY: Has had prior appendectomy and cholecystectomy  Physical Exam:   Blood pressure 119/79, pulse 93, temperature 99.1 F (37.3 C), temperature source Oral, resp. rate 18, height 5\' 9"  (1.753 m), weight 233 lb 7.5 oz (105.9 kg), SpO2 95.00%. Body mass index is 34.48 kg/(m^2).  Gen:  WDWN pale appearing white female NAD  Neurological: Alert and oriented to person, place, and time. Motor and sensory function is grossly intact  Head: Normocephalic and atraumatic.  Eyes: Conjunctivae are normal. Pupils are equal, round, and reactive to light. No scleral icterus.  Neck: Normal range of motion. Neck supple. No tracheal deviation or thyromegaly present.  Cardiovascular:  SR without  murmurs or gallops.  No carotid bruits Respiratory: Effort normal.  No respiratory distress. No chest wall tenderness. Breath sounds normal.  No wheezes, rales or rhonchi.  Abdomen:  Obese, no rebound or guarding. GU: Musculoskeletal: Normal range of motion. Extremities are nontender. No cyanosis, edema or clubbing noted Lymphadenopathy: No cervical, preauricular, postauricular or axillary adenopathy is present Skin:  Skin is warm and dry. No rash noted. No diaphoresis. No erythema. No pallor. Pscyh: Normal mood and affect. Behavior is normal. Judgment and thought content normal.   LABORATORY RESULTS: Results for orders placed during the hospital encounter of 04/17/11 (from the past 48 hour(s))  GLUCOSE, CAPILLARY     Status: Abnormal   Collection Time   04/22/11 12:01 PM      Component Value Range Comment   Glucose-Capillary 157 (*) 70 - 99 (mg/dL)   GLUCOSE, CAPILLARY     Status: Abnormal   Collection Time   04/22/11  4:58 PM      Component Value Range Comment   Glucose-Capillary 152 (*) 70 - 99 (mg/dL)    Comment 1 Notify RN     GLUCOSE, CAPILLARY     Status: Abnormal   Collection Time   04/22/11  9:28 PM      Component Value Range Comment   Glucose-Capillary 142 (*) 70 - 99 (mg/dL)    Comment 1 Notify RN     CBC     Status: Abnormal   Collection Time   04/23/11  3:30 AM      Component Value Range Comment   WBC 7.7  4.0 - 10.5 (K/uL)    RBC 2.93 (*) 3.87 - 5.11 (MIL/uL)    Hemoglobin 9.1 (*) 12.0 - 15.0 (g/dL)    HCT 45.4 (*) 09.8 - 46.0 (%)    MCV 93.2  78.0 - 100.0 (fL)    MCH 31.1  26.0 - 34.0 (pg)    MCHC 33.3  30.0 - 36.0 (g/dL)    RDW 11.9 (*) 14.7 - 15.5 (%)    Platelets 240  150 - 400 (K/uL)   COMPREHENSIVE METABOLIC PANEL     Status: Abnormal   Collection Time   04/23/11  3:30 AM      Component Value Range Comment   Sodium 130 (*) 135 - 145 (mEq/L)    Potassium 4.0  3.5 - 5.1 (mEq/L)    Chloride 102  96 - 112 (mEq/L)    CO2 21  19 - 32 (mEq/L)    Glucose, Bld 136  (*) 70 - 99 (mg/dL)    BUN 30 (*) 6 - 23 (mg/dL)    Creatinine, Ser 8.29 (*) 0.50 - 1.10 (mg/dL)    Calcium 8.4  8.4 - 10.5 (mg/dL)    Total Protein 5.3 (*) 6.0 - 8.3 (g/dL)    Albumin 1.7 (*) 3.5 - 5.2 (g/dL)    AST 31  0 - 37 (U/L)    ALT 30  0 - 35 (U/L)    Alkaline Phosphatase 331 (*) 39 - 117 (U/L)    Total Bilirubin 0.3  0.3 - 1.2 (mg/dL)    GFR calc non Af Amer 29 (*) >90 (mL/min)    GFR calc Af Amer 33 (*) >90 (mL/min)   GLUCOSE, CAPILLARY     Status: Abnormal   Collection Time   04/23/11  7:43 AM      Component Value Range Comment   Glucose-Capillary 120 (*) 70 - 99 (mg/dL)    Comment 1 Notify RN     GLUCOSE, CAPILLARY     Status: Abnormal   Collection Time   04/23/11 11:33 AM      Component Value Range Comment   Glucose-Capillary 143 (*) 70 - 99 (mg/dL)    Comment 1 Documented in Chart      Comment 2 Notify RN     GLUCOSE, CAPILLARY  Status: Abnormal   Collection Time   04/23/11  4:46 PM      Component Value Range Comment   Glucose-Capillary 129 (*) 70 - 99 (mg/dL)   GLUCOSE, CAPILLARY     Status: Abnormal   Collection Time   04/23/11 10:02 PM      Component Value Range Comment   Glucose-Capillary 172 (*) 70 - 99 (mg/dL)   CBC     Status: Abnormal   Collection Time   04/24/11  3:30 AM      Component Value Range Comment   WBC 14.1 (*) 4.0 - 10.5 (K/uL)    RBC 2.99 (*) 3.87 - 5.11 (MIL/uL)    Hemoglobin 9.1 (*) 12.0 - 15.0 (g/dL)    HCT 09.8 (*) 11.9 - 46.0 (%)    MCV 94.0  78.0 - 100.0 (fL)    MCH 30.4  26.0 - 34.0 (pg)    MCHC 32.4  30.0 - 36.0 (g/dL)    RDW 14.7 (*) 82.9 - 15.5 (%)    Platelets 254  150 - 400 (K/uL)   COMPREHENSIVE METABOLIC PANEL     Status: Abnormal   Collection Time   04/24/11  3:30 AM      Component Value Range Comment   Sodium 128 (*) 135 - 145 (mEq/L)    Potassium 4.3  3.5 - 5.1 (mEq/L)    Chloride 100  96 - 112 (mEq/L)    CO2 20  19 - 32 (mEq/L)    Glucose, Bld 124 (*) 70 - 99 (mg/dL)    BUN 29 (*) 6 - 23 (mg/dL)    Creatinine,  Ser 5.62 (*) 0.50 - 1.10 (mg/dL)    Calcium 8.4  8.4 - 10.5 (mg/dL)    Total Protein 5.4 (*) 6.0 - 8.3 (g/dL)    Albumin 1.7 (*) 3.5 - 5.2 (g/dL)    AST 33  0 - 37 (U/L)    ALT 26  0 - 35 (U/L)    Alkaline Phosphatase 345 (*) 39 - 117 (U/L)    Total Bilirubin 0.3  0.3 - 1.2 (mg/dL)    GFR calc non Af Amer 27 (*) >90 (mL/min)    GFR calc Af Amer 32 (*) >90 (mL/min)   GLUCOSE, CAPILLARY     Status: Normal   Collection Time   04/24/11  7:48 AM      Component Value Range Comment   Glucose-Capillary 95  70 - 99 (mg/dL)     RADIOLOGY RESULTS: No results found.  Problem List: Patient Active Problem List  Diagnoses  . DM  . HYPERCHOLESTEROLEMIA  IIA  . MACULAR DEGENERATION  . HYPERTENSION, UNSPECIFIED  . Atrial fibrillation  . OSTEOARTHRITIS  . OSTEOPOROSIS  . EDEMA  . Anemia  . Pyelonephritis, acute  . Renal insufficiency  . Hyponatremia  . Obesity  . Bacteremia due to Klebsiella pneumoniae    Assessment & Plan: Pyelonephritis with no other etiology. MR of the abdomen might be helpful to look for occult perforation in the area. Usually fish bone perforations another foreign bodies will leave bowel change footprints. We'll follow and discuss with you.    Matt B. Daphine Deutscher, MD, [facility] [phone] beeper [phone]  04/24/2011 11:22 AM

## 2011-04-24 NOTE — Progress Notes (Signed)
Subjective: She continues to have vague lower abdominal pain associated with nausea and episodes of vomiting. She was able to be a small amount of food today. She does not have shortness of breath, cough, or chest pain.  Objective: Vital signs in last 24 hours: Temp:  [98.1 F (36.7 C)-99.1 F (37.3 C)] 99.1 F (37.3 C) (02/16 0604) Pulse Rate:  [68-94] 93  (02/16 0604) Resp:  [18] 18  (02/16 0604) BP: (119-158)/(79-82) 119/79 mmHg (02/16 0604) SpO2:  [95 %-98 %] 95 % (02/16 0604) Weight change:    Intake/Output from previous day: 02/15 0701 - 02/16 0700 In: 660 [P.O.:660] Out: 1500 [Urine:1500]   General appearance: alert, cooperative and no distress Resp: clear to auscultation bilaterally Cardio: regular rate and rhythm, S1, S2 normal, no murmur, click, rub or gallop GI: Bowel sounds were very much decreased but present, there was mild tympany, no tenderness to palpation Extremities: extremities normal, atraumatic, no cyanosis or edema  Lab Results:  Basename 04/24/11 0330 04/23/11 0330  WBC 14.1* 7.7  HGB 9.1* 9.1*  HCT 28.1* 27.3*  PLT 254 240   BMET  Basename 04/24/11 0330 04/23/11 0330  NA 128* 130*  K 4.3 4.0  CL 100 102  CO2 20 21  GLUCOSE 124* 136*  BUN 29* 30*  CREATININE 1.74* 1.67*  CALCIUM 8.4 8.4   CMET CMP     Component Value Date/Time   NA 128* 04/24/2011 0330   K 4.3 04/24/2011 0330   CL 100 04/24/2011 0330   CO2 20 04/24/2011 0330   GLUCOSE 124* 04/24/2011 0330   BUN 29* 04/24/2011 0330   CREATININE 1.74* 04/24/2011 0330   CALCIUM 8.4 04/24/2011 0330   CALCIUM 8.2* 10/10/2010 1613   PROT 5.4* 04/24/2011 0330   ALBUMIN 1.7* 04/24/2011 0330   AST 33 04/24/2011 0330   ALT 26 04/24/2011 0330   ALKPHOS 345* 04/24/2011 0330   BILITOT 0.3 04/24/2011 0330   GFRNONAA 27* 04/24/2011 0330   GFRAA 32* 04/24/2011 0330    CBG (last 3)   Basename 04/24/11 0748 04/23/11 2202 04/23/11 1646  GLUCAP 95 172* 129*    INR RESULTS:   Lab Results  Component  Value Date   INR 1.10* 02/05/2011   INR 1.14 11/15/2010   INR 1.88* 10/17/2010   PROTIME 13.2 02/05/2011   PROTIME 19.7 08/09/2008     Studies/Results: No results found.  Medications: I have reviewed the patient's current medications.  Assessment/Plan:  #1 urinary tract infection: Clinically stable on antibiotics. #2 retroperitoneal air: This is concerning and could be due to a developing abscess given her persistent fever and interval increase in white blood cell count with nausea and abdominal pain. I will request a formal general surgery consultation for this.  #3 diabetes mellitus, type II: Well controlled on current medications #4 acute renal insufficiency: Her serum creatinine level has risen a bit, so we will hold losartan for now #5 anemia: Stable on iron supplementation.  LOS: 7 days   Fruma Africa G 04/24/2011, 9:51 AM

## 2011-04-24 NOTE — Progress Notes (Signed)
Reviewed chart Mild bump in Dr WBC increased today Abdominal pain continues Not toxic Would be reasonable to ask General surgery to review Spoke with Dr Jarold Motto

## 2011-04-25 ENCOUNTER — Inpatient Hospital Stay (HOSPITAL_COMMUNITY): Payer: Medicare Other

## 2011-04-25 LAB — GLUCOSE, CAPILLARY

## 2011-04-25 LAB — CULTURE, BLOOD (ROUTINE X 2)
Culture  Setup Time: 201302111356
Culture: NO GROWTH
Culture: NO GROWTH

## 2011-04-25 LAB — COMPREHENSIVE METABOLIC PANEL
Albumin: 1.5 g/dL — ABNORMAL LOW (ref 3.5–5.2)
BUN: 26 mg/dL — ABNORMAL HIGH (ref 6–23)
CO2: 21 mEq/L (ref 19–32)
Chloride: 99 mEq/L (ref 96–112)
Creatinine, Ser: 1.8 mg/dL — ABNORMAL HIGH (ref 0.50–1.10)
GFR calc non Af Amer: 26 mL/min — ABNORMAL LOW (ref >90)
Total Bilirubin: 0.3 mg/dL (ref 0.3–1.2)

## 2011-04-25 LAB — CBC
HCT: 24.2 % — ABNORMAL LOW (ref 36.0–46.0)
MCV: 93.1 fL (ref 78.0–100.0)
RDW: 16.6 % — ABNORMAL HIGH (ref 11.5–15.5)
WBC: 14.9 10*3/uL — ABNORMAL HIGH (ref 4.0–10.5)

## 2011-04-25 NOTE — Progress Notes (Signed)
Patient ID: Kathryn [name], female   DOB: 07-17-1933, 78 y.o.   MRN: [id number] [facility] Progress Note:   * No surgery found *  Subjective: Mental status is clear Objective: Vital signs in last 24 hours: Temp:  [97.7 F (36.5 C)-99.8 F (37.7 C)] 97.7 F (36.5 C) (02/17 0520) Pulse Rate:  [64-93] 64  (02/17 0520) Resp:  [16-18] 16  (02/17 0520) BP: (109-180)/(69-77) 109/69 mmHg (02/17 0520) SpO2:  [93 %-97 %] 97 % (02/17 0520)  Intake/Output from previous day: 02/16 0701 - 02/17 0700 In: 2380 [P.O.:1130; I.V.:700; IV Piggyback:50] Out: 1450 [Urine:1450] Intake/Output this shift: Total I/O In: 300 [P.O.:300] Out: -   Physical Exam: Work of breathing is  Normal.    Lab Results:  Results for orders placed during the hospital encounter of 04/17/11 (from the past 48 hour(s))  GLUCOSE, CAPILLARY     Status: Abnormal   Collection Time   04/23/11 11:33 AM      Component Value Range Comment   Glucose-Capillary 143 (*) 70 - 99 (mg/dL)    Comment 1 Documented in Chart      Comment 2 Notify RN     GLUCOSE, CAPILLARY     Status: Abnormal   Collection Time   04/23/11  4:46 PM      Component Value Range Comment   Glucose-Capillary 129 (*) 70 - 99 (mg/dL)   GLUCOSE, CAPILLARY     Status: Abnormal   Collection Time   04/23/11 10:02 PM      Component Value Range Comment   Glucose-Capillary 172 (*) 70 - 99 (mg/dL)   CBC     Status: Abnormal   Collection Time   04/24/11  3:30 AM      Component Value Range Comment   WBC 14.1 (*) 4.0 - 10.5 (K/uL)    RBC 2.99 (*) 3.87 - 5.11 (MIL/uL)    Hemoglobin 9.1 (*) 12.0 - 15.0 (g/dL)    HCT 14.7 (*) 82.9 - 46.0 (%)    MCV 94.0  78.0 - 100.0 (fL)    MCH 30.4  26.0 - 34.0 (pg)    MCHC 32.4  30.0 - 36.0 (g/dL)    RDW 56.2 (*) 13.0 - 15.5 (%)    Platelets 254  150 - 400 (K/uL)   COMPREHENSIVE METABOLIC PANEL     Status: Abnormal   Collection Time   04/24/11  3:30 AM      Component Value Range Comment   Sodium 128 (*) 135 - 145  (mEq/L)    Potassium 4.3  3.5 - 5.1 (mEq/L)    Chloride 100  96 - 112 (mEq/L)    CO2 20  19 - 32 (mEq/L)    Glucose, Bld 124 (*) 70 - 99 (mg/dL)    BUN 29 (*) 6 - 23 (mg/dL)    Creatinine, Ser 8.65 (*) 0.50 - 1.10 (mg/dL)    Calcium 8.4  8.4 - 10.5 (mg/dL)    Total Protein 5.4 (*) 6.0 - 8.3 (g/dL)    Albumin 1.7 (*) 3.5 - 5.2 (g/dL)    AST 33  0 - 37 (U/L)    ALT 26  0 - 35 (U/L)    Alkaline Phosphatase 345 (*) 39 - 117 (U/L)    Total Bilirubin 0.3  0.3 - 1.2 (mg/dL)    GFR calc non Af Amer 27 (*) >90 (mL/min)    GFR calc Af Amer 32 (*) >90 (mL/min)   GLUCOSE, CAPILLARY     Status:  Normal   Collection Time   04/24/11  7:48 AM      Component Value Range Comment   Glucose-Capillary 95  70 - 99 (mg/dL)   GLUCOSE, CAPILLARY     Status: Abnormal   Collection Time   04/24/11 11:55 AM      Component Value Range Comment   Glucose-Capillary 122 (*) 70 - 99 (mg/dL)   GLUCOSE, CAPILLARY     Status: Abnormal   Collection Time   04/24/11  1:40 PM      Component Value Range Comment   Glucose-Capillary 123 (*) 70 - 99 (mg/dL)   GLUCOSE, CAPILLARY     Status: Abnormal   Collection Time   04/24/11  5:34 PM      Component Value Range Comment   Glucose-Capillary 133 (*) 70 - 99 (mg/dL)    Comment 1 Documented in Chart      Comment 2 Notify RN     GLUCOSE, CAPILLARY     Status: Abnormal   Collection Time   04/24/11  8:46 PM      Component Value Range Comment   Glucose-Capillary 190 (*) 70 - 99 (mg/dL)    Comment 1 Notify RN     CBC     Status: Abnormal   Collection Time   04/25/11  3:35 AM      Component Value Range Comment   WBC 14.9 (*) 4.0 - 10.5 (K/uL)    RBC 2.60 (*) 3.87 - 5.11 (MIL/uL)    Hemoglobin 8.0 (*) 12.0 - 15.0 (g/dL)    HCT 16.1 (*) 09.6 - 46.0 (%)    MCV 93.1  78.0 - 100.0 (fL)    MCH 30.8  26.0 - 34.0 (pg)    MCHC 33.1  30.0 - 36.0 (g/dL)    RDW 04.5 (*) 40.9 - 15.5 (%)    Platelets 276  150 - 400 (K/uL)   COMPREHENSIVE METABOLIC PANEL     Status: Abnormal    Collection Time   04/25/11  3:35 AM      Component Value Range Comment   Sodium 126 (*) 135 - 145 (mEq/L)    Potassium 4.2  3.5 - 5.1 (mEq/L)    Chloride 99  96 - 112 (mEq/L)    CO2 21  19 - 32 (mEq/L)    Glucose, Bld 167 (*) 70 - 99 (mg/dL)    BUN 26 (*) 6 - 23 (mg/dL)    Creatinine, Ser 8.11 (*) 0.50 - 1.10 (mg/dL)    Calcium 8.0 (*) 8.4 - 10.5 (mg/dL)    Total Protein 4.8 (*) 6.0 - 8.3 (g/dL)    Albumin 1.5 (*) 3.5 - 5.2 (g/dL)    AST 26  0 - 37 (U/L)    ALT 24  0 - 35 (U/L)    Alkaline Phosphatase 385 (*) 39 - 117 (U/L)    Total Bilirubin 0.3  0.3 - 1.2 (mg/dL)    GFR calc non Af Amer 26 (*) >90 (mL/min)    GFR calc Af Amer 31 (*) >90 (mL/min)   GLUCOSE, CAPILLARY     Status: Abnormal   Collection Time   04/25/11  7:25 AM      Component Value Range Comment   Glucose-Capillary 138 (*) 70 - 99 (mg/dL)     Radiology/Results: No results found.  Anti-infectives: Anti-infectives     Start     Dose/Rate Route Frequency Ordered Stop   04/20/11 1400   cefTRIAXone (ROCEPHIN) 1 g in dextrose 5 %  50 mL IVPB        1 g 100 mL/hr over 30 Minutes Intravenous Every 24 hours 04/20/11 1337     04/19/11 1200   imipenem-cilastatin (PRIMAXIN) 250 mg in sodium chloride 0.9 % 100 mL IVPB  Status:  Discontinued        250 mg 200 mL/hr over 30 Minutes Intravenous 4 times per day 04/19/11 1045 04/20/11 1337   04/18/11 2200   cefTRIAXone (ROCEPHIN) 1 g in dextrose 5 % 50 mL IVPB  Status:  Discontinued        1 g 100 mL/hr over 30 Minutes Intravenous Daily at bedtime 04/18/11 0020 04/19/11 0913   04/18/11 0000   cefTRIAXone (ROCEPHIN) 1 g in dextrose 5 % 50 mL IVPB  Status:  Discontinued        1 g 100 mL/hr over 30 Minutes Intravenous Every 24 hours 04/17/11 2217 04/18/11 0020   04/17/11 2015   cefTRIAXone (ROCEPHIN) 1 g in dextrose 5 % 50 mL IVPB        1 g 100 mL/hr over 30 Minutes Intravenous  Once 04/17/11 2002 04/17/11 2307          Assessment/Plan: Problem List: Patient Active  Problem List  Diagnoses  . DM  . HYPERCHOLESTEROLEMIA  IIA  . MACULAR DEGENERATION  . HYPERTENSION, UNSPECIFIED  . Atrial fibrillation  . OSTEOARTHRITIS  . OSTEOPOROSIS  . EDEMA  . Anemia  . Pyelonephritis, acute  . Renal insufficiency  . Hyponatremia  . Obesity  . Bacteremia due to Klebsiella pneumoniae    Awaiting repeat CT with more contrast.  If that doesn't do it then MR might be pursued.  * No surgery found *    LOS: 8 days   Matt B. Daphine Deutscher, MD, [facility] [phone] beeper [phone]  04/25/2011 10:48 AM

## 2011-04-25 NOTE — Consult Note (Signed)
ID f/u    [name] remains mildly nauseated and WBC has risen to 14.9 with temps close to 100 peak. Reviewed DrsDaphine Deutscher and Jarold Motto notes re possible obstructed ureter and non contrasted MRI may help. Would continue ceftriaxone and doesn't need adjustment. Dr. Merceda Elks will f/u for ID 2/18. Kathryn [name]

## 2011-04-25 NOTE — Progress Notes (Signed)
Subjective: Today she continues to have mild nausea and very mild right lower quadrant abdominal pain. She has not had breakfast yet. She denies shortness of breath or fever or chills. She has not had diarrhea or dysuria or frequency. Objective: Vital signs in last 24 hours: Temp:  [97.7 F (36.5 C)-99.8 F (37.7 C)] 97.7 F (36.5 C) (02/17 0520) Pulse Rate:  [64-93] 64  (02/17 0520) Resp:  [16-18] 16  (02/17 0520) BP: (109-180)/(69-77) 109/69 mmHg (02/17 0520) SpO2:  [93 %-97 %] 97 % (02/17 0520) Weight change:    Intake/Output from previous day: 02/16 0701 - 02/17 0700 In: 2380 [P.O.:1130; I.V.:700; IV Piggyback:50] Out: 1450 [Urine:1450]   General appearance: alert, cooperative and no distress Resp: clear to auscultation bilaterally Cardio: regular rate and rhythm, S1, S2 normal, no murmur, click, rub or gallop GI: Bowel sounds are normal, and there was mild right lower quadrant pain with deep palpation with mild rebound tenderness  Lab Results:  Basename 04/25/11 0335 04/24/11 0330  WBC 14.9* 14.1*  HGB 8.0* 9.1*  HCT 24.2* 28.1*  PLT 276 254   BMET  Basename 04/25/11 0335 04/24/11 0330  NA 126* 128*  K 4.2 4.3  CL 99 100  CO2 21 20  GLUCOSE 167* 124*  BUN 26* 29*  CREATININE 1.80* 1.74*  CALCIUM 8.0* 8.4   CMET CMP     Component Value Date/Time   NA 126* 04/25/2011 0335   K 4.2 04/25/2011 0335   CL 99 04/25/2011 0335   CO2 21 04/25/2011 0335   GLUCOSE 167* 04/25/2011 0335   BUN 26* 04/25/2011 0335   CREATININE 1.80* 04/25/2011 0335   CALCIUM 8.0* 04/25/2011 0335   CALCIUM 8.2* 10/10/2010 1613   PROT 4.8* 04/25/2011 0335   ALBUMIN 1.5* 04/25/2011 0335   AST 26 04/25/2011 0335   ALT 24 04/25/2011 0335   ALKPHOS 385* 04/25/2011 0335   BILITOT 0.3 04/25/2011 0335   GFRNONAA 26* 04/25/2011 0335   GFRAA 31* 04/25/2011 0335    CBG (last 3)   Basename 04/25/11 0725 04/24/11 2046 04/24/11 1734  GLUCAP 138* 190* 133*    INR RESULTS:   Lab Results  Component  Value Date   INR 1.10* 02/05/2011   INR 1.14 11/15/2010   INR 1.88* 10/17/2010   PROTIME 13.2 02/05/2011   PROTIME 19.7 08/09/2008     Studies/Results: No results found.  Medications: I have reviewed the patient's current medications.  Assessment/Plan: #1 urinary tract infection: She is on appropriate antibiotics for her urinary tract infection, but is slow to fully improve. Upon review, the area of apparent retroperitoneal air was seen on the midportion of the right ureter in the past CT scan. She may be developing an abscess given her continued symptoms and interval rise in white blood cell count. We will check a MRI exam of the abdomen and pelvis as suggested by Dr. Daphine Deutscher for further evaluation. We'll continue current antibiotics and recheck labs in the morning. #2 acute renal insufficiency: Her serum creatinine level continues to arise somewhat, so we will maintain her off of losartan and increase the rate of IV fluids. #3 diabetes mellitus, type II: Well controlled on current medications #4 hyponatremia: Interval slight decrease in serum sodium level which should improve with holding losartan and increasing IV fluids. We'll recheck labs tomorrow morning.  LOS: 8 days   Italo Banton G 04/25/2011, 8:32 AM

## 2011-04-25 NOTE — Progress Notes (Signed)
Patient ID: Kathryn [name], female   DOB: Jun 22, 1933, 78 y.o.   MRN: [id number]  CT scan discussed with Dr. Gordan Payment in Radiology.  The small amount of retroperitoneal air has resolved. There is slightly increased density in the right retroperitoneal area where the right ureter crosses the right iliac vessels. There is no evidence of primary bowel disease. The appendix is surgically absent.  Assessment:  No radiologic evidence for GI source of bacteremia.  Plan:    Discuss with urology.              Will follow   [name]. Derrell Lolling, M.D., [facility] General and Minimally invasive Surgery Breast and Colorectal Surgery Office:   [phone] Pager:   [phone]

## 2011-04-25 NOTE — Progress Notes (Signed)
Subjective: Patient reports feeling better today. No N/V. Just finished lunch and ate all of it. Also reports abdominal pain better.  Objective: Vital signs in last 24 hours: Temp:  [97.7 F (36.5 C)-99.8 F (37.7 C)] 97.7 F (36.5 C) (02/17 0520) Pulse Rate:  [64-93] 64  (02/17 0520) Resp:  [16-18] 16  (02/17 0520) BP: (109-180)/(69-77) 109/69 mmHg (02/17 0520) SpO2:  [93 %-97 %] 97 % (02/17 0520)  Intake/Output from previous day: 02/16 0701 - 02/17 0700 In: 2380 [P.O.:1130; I.V.:700; IV Piggyback:50] Out: 1450 [Urine:1450] Intake/Output this shift: Total I/O In: 300 [P.O.:300] Out: -   Physical Exam:  General: well appearing, NAD, lying in bed Abd - soft, NT    Lab Results:  Basename 04/25/11 0335 04/24/11 0330 04/23/11 0330  HGB 8.0* 9.1* 9.1*  HCT 24.2* 28.1* 27.3*   BMET  Basename 04/25/11 0335 04/24/11 0330  NA 126* 128*  K 4.2 4.3  CL 99 100  CO2 21 20  GLUCOSE 167* 124*  BUN 26* 29*  CREATININE 1.80* 1.74*  CALCIUM 8.0* 8.4   No results found for this basename: LABPT:3,INR:3 in the last 72 hours No results found for this basename: LABURIN:1 in the last 72 hours Results for orders placed during the hospital encounter of 04/17/11  CULTURE, BLOOD (ROUTINE X 2)     Status: Normal   Collection Time   04/17/11  6:45 PM      Component Value Range Status Comment   Specimen Description BLOOD RIGHT HAND   Final    Special Requests BOTTLES DRAWN AEROBIC AND ANAEROBIC 5 CC EACH   Final    Culture  Setup Time [id number]   Final    Culture     Final    Value: KLEBSIELLA PNEUMONIAE     Note: Gram Stain Report Called to,Read Back By and Verified With: [facility]ER 04/22/11 1535 BY SMITHERSJ   Report Status 04/24/2011 FINAL   Final    Organism ID, Bacteria KLEBSIELLA PNEUMONIAE   Final   URINE CULTURE     Status: Normal   Collection Time   04/17/11  7:31 PM      Component Value Range Status Comment   Specimen Description URINE, CATHETERIZED   Final    Special  Requests NONE   Final    Culture  Setup Time [id number]   Final    Colony Count 85,000 COLONIES/ML   Final    Culture KLEBSIELLA PNEUMONIAE   Final    Report Status 04/19/2011 FINAL   Final    Organism ID, Bacteria KLEBSIELLA PNEUMONIAE   Final   CULTURE, BLOOD (ROUTINE X 2)     Status: Normal   Collection Time   04/17/11  9:50 PM      Component Value Range Status Comment   Specimen Description BLOOD LEFT ARM   Final    Special Requests BOTTLES DRAWN AEROBIC AND ANAEROBIC 5CC   Final    Culture  Setup Time [id number]   Final    Culture     Final    Value: KLEBSIELLA PNEUMONIAE     Note: CRITICAL RESULT CALLED TO, READ BACK BY AND VERIFIED WITH: Bretta Bang @ 840PM BY VINCJ 04/18/11   Report Status 04/20/2011 FINAL   Final    Organism ID, Bacteria KLEBSIELLA PNEUMONIAE   Final   MRSA PCR SCREENING     Status: Normal   Collection Time   04/18/11 12:17 AM      Component Value Range Status Comment  MRSA by PCR NEGATIVE  NEGATIVE  Final   CULTURE, BLOOD (ROUTINE X 2)     Status: Normal   Collection Time   04/19/11  9:50 AM      Component Value Range Status Comment   Specimen Description BLOOD LEFT HAND   Final    Special Requests BOTTLES DRAWN AEROBIC AND ANAEROBIC 10CC   Final    Culture  Setup Time [id number]   Final    Culture NO GROWTH 5 DAYS   Final    Report Status 04/25/2011 FINAL   Final   CULTURE, BLOOD (ROUTINE X 2)     Status: Normal   Collection Time   04/19/11 10:00 AM      Component Value Range Status Comment   Specimen Description BLOOD LEFT ARM   Final    Special Requests BOTTLES DRAWN AEROBIC AND ANAEROBIC 10CC   Final    Culture  Setup Time [id number]   Final    Culture NO GROWTH 5 DAYS   Final    Report Status 04/25/2011 FINAL   Final     Studies/Results: No results found.  Assessment/Plan:  Mild hydro, pyelo  -repeat CT per Gen Surg. -will follow   LOS: 8 days   Antony Haste 04/25/2011, 12:13 PM

## 2011-04-26 DIAGNOSIS — B9689 Other specified bacterial agents as the cause of diseases classified elsewhere: Secondary | ICD-10-CM

## 2011-04-26 DIAGNOSIS — N390 Urinary tract infection, site not specified: Secondary | ICD-10-CM

## 2011-04-26 LAB — COMPREHENSIVE METABOLIC PANEL
ALT: 26 U/L (ref 0–35)
CO2: 19 mEq/L (ref 19–32)
Calcium: 8.1 mg/dL — ABNORMAL LOW (ref 8.4–10.5)
Creatinine, Ser: 1.9 mg/dL — ABNORMAL HIGH (ref 0.50–1.10)
GFR calc Af Amer: 29 mL/min — ABNORMAL LOW (ref >90)
GFR calc non Af Amer: 25 mL/min — ABNORMAL LOW (ref >90)
Glucose, Bld: 133 mg/dL — ABNORMAL HIGH (ref 70–99)

## 2011-04-26 LAB — CBC
HCT: 24.3 % — ABNORMAL LOW (ref 36.0–46.0)
Hemoglobin: 8.1 g/dL — ABNORMAL LOW (ref 12.0–15.0)
MCH: 31.3 pg (ref 26.0–34.0)
MCV: 93.8 fL (ref 78.0–100.0)
RBC: 2.59 MIL/uL — ABNORMAL LOW (ref 3.87–5.11)

## 2011-04-26 LAB — GLUCOSE, CAPILLARY: Glucose-Capillary: 149 mg/dL — ABNORMAL HIGH (ref 70–99)

## 2011-04-26 NOTE — Progress Notes (Signed)
Patient ID: SHANESHA [name], female   DOB: 01/28/1934, 78 y.o.   MRN: [id number]   Subjective: Patient reports feeling substantially better in the last 48 hours. Her abdominal pain has essentially resolved her nausea is improved. General surgery consultation has been obtained and I reviewed recent notes as well as her most recent imaging studies.  Objective: Vital signs in last 24 hours: Temp:  [98.3 F (36.8 C)-99.5 F (37.5 C)] 98.3 F (36.8 C) (02/18 0515) Pulse Rate:  [72-89] 85  (02/18 0515) Resp:  [17-18] 18  (02/18 0515) BP: (126-146)/(75-77) 126/77 mmHg (02/18 0515) SpO2:  [96 %-99 %] 96 % (02/18 0515)  Intake/Output from previous day: 02/17 0701 - 02/18 0700 In: 4638.3 [P.O.:720; I.V.:3918.3] Out: 1900 [Urine:1900] Intake/Output this shift: Total I/O In: 400 [P.O.:400] Out: -   Physical Exam:  Constitutional: Vital signs reviewed. WD WN in NAD   Eyes: PERRL, No scleral icterus.   Cardiovascular: RRR Pulmonary/Chest: Normal effort Abdominal: Soft. Non-tender, non-distended, bowel sounds are normal, no masses, organomegaly, or guarding present.  Genitourinary:Not done Extremities: No cyanosis or edema   Lab Results:  Basename 04/26/11 0328 04/25/11 0335 04/24/11 0330  HGB 8.1* 8.0* 9.1*  HCT 24.3* 24.2* 28.1*   BMET  Basename 04/26/11 0328 04/25/11 0335  NA 125* 126*  K 4.5 4.2  CL 99 99  CO2 19 21  GLUCOSE 133* 167*  BUN 28* 26*  CREATININE 1.90* 1.80*  CALCIUM 8.1* 8.0*   No results found for this basename: LABPT:3,INR:3 in the last 72 hours No results found for this basename: LABURIN:1 in the last 72 hours Results for orders placed during the hospital encounter of 04/17/11  CULTURE, BLOOD (ROUTINE X 2)     Status: Normal   Collection Time   04/17/11  6:45 PM      Component Value Range Status Comment   Specimen Description BLOOD RIGHT HAND   Final    Special Requests BOTTLES DRAWN AEROBIC AND ANAEROBIC 5 CC EACH   Final    Culture  Setup Time  [id number]   Final    Culture     Final    Value: KLEBSIELLA PNEUMONIAE     Note: Gram Stain Report Called to,Read Back By and Verified With: [facility] MILLER 04/22/11 1535 BY SMITHERSJ   Report Status 04/24/2011 FINAL   Final    Organism ID, Bacteria KLEBSIELLA PNEUMONIAE   Final   URINE CULTURE     Status: Normal   Collection Time   04/17/11  7:31 PM      Component Value Range Status Comment   Specimen Description URINE, CATHETERIZED   Final    Special Requests NONE   Final    Culture  Setup Time [id number]   Final    Colony Count 85,000 COLONIES/ML   Final    Culture KLEBSIELLA PNEUMONIAE   Final    Report Status 04/19/2011 FINAL   Final    Organism ID, Bacteria KLEBSIELLA PNEUMONIAE   Final   CULTURE, BLOOD (ROUTINE X 2)     Status: Normal   Collection Time   04/17/11  9:50 PM      Component Value Range Status Comment   Specimen Description BLOOD LEFT ARM   Final    Special Requests BOTTLES DRAWN AEROBIC AND ANAEROBIC 5CC   Final    Culture  Setup Time [id number]   Final    Culture     Final    Value: KLEBSIELLA PNEUMONIAE     Note: CRITICAL  RESULT CALLED TO, READ BACK BY AND VERIFIED WITH: Bretta Bang @ 840PM BY VINCJ 04/18/11   Report Status 04/20/2011 FINAL   Final    Organism ID, Bacteria KLEBSIELLA PNEUMONIAE   Final   MRSA PCR SCREENING     Status: Normal   Collection Time   04/18/11 12:17 AM      Component Value Range Status Comment   MRSA by PCR NEGATIVE  NEGATIVE  Final   CULTURE, BLOOD (ROUTINE X 2)     Status: Normal   Collection Time   04/19/11  9:50 AM      Component Value Range Status Comment   Specimen Description BLOOD LEFT HAND   Final    Special Requests BOTTLES DRAWN AEROBIC AND ANAEROBIC 10CC   Final    Culture  Setup Time [id number]   Final    Culture NO GROWTH 5 DAYS   Final    Report Status 04/25/2011 FINAL   Final   CULTURE, BLOOD (ROUTINE X 2)     Status: Normal   Collection Time   04/19/11 10:00 AM      Component Value Range Status Comment    Specimen Description BLOOD LEFT ARM   Final    Special Requests BOTTLES DRAWN AEROBIC AND ANAEROBIC 10CC   Final    Culture  Setup Time [id number]   Final    Culture NO GROWTH 5 DAYS   Final    Report Status 04/25/2011 FINAL   Final     Studies/Results: Ct Abdomen Pelvis Wo Contrast  04/25/2011  *RADIOLOGY REPORT*  Clinical Data: Urinary tract infection.  Progressive leukocytosis despite antibiotics.  Recently demonstrated small amount of gas adjacent to the mid right ureter with right greater than left hydronephrosis.  Clinical concern for developing abscess.  CT ABDOMEN AND PELVIS WITHOUT CONTRAST  Technique:  Multidetector CT imaging of the abdomen and pelvis was performed following the standard protocol without intravenous contrast.  Comparison: 04/21/2011.  Findings: Mildly progressive dilatation of the right renal collecting system., currently moderately dilated.  Mild dilatation of the left renal collecting system with mild progression.  The left ureter is mildly dilated to the level of the urinary bladder with no obstructing calculus seen.  There is a Foley catheter in the bladder with no urine seen in the bladder.  The proximal right ureter is moderately dilated to the level of the mid ureter in the upper pelvis, where the previously demonstrated small amount of gas was seen.  Currently, no gas is seen in that region.  However, there has been interval development of an oval area of mildly increased density in that area, measuring 2.6 x 2.1 cm in maximum dimensions on image number 55.  This measures 63 HU in density on image.  There is minimal progression of adjacent extraluminal fluid.  This includes a fluid collection on the right measuring 5.9 x 3.1 cm in maximum dimensions on image number 48, previously measuring 5.6 x 2.4 cm in maximum dimensions.  The appendix is not visualized and is surgically absent by history. No gastrointestinal abnormalities are seen.  No enlarged lymph nodes.   Atheromatous arterial calcifications, including intrasplenic arteries.  Unremarkable liver.  Mild nodularity of the left adrenal gland measuring near fluid density, compatible with two small adenomas.  Unremarkable right adrenal gland, uterus and left ovary.  The right ovary is mildly enlarged for the age of the patient, measuring 4.2 x 2.4 cm in maximum dimensions on image number 62.  Small bilateral pleural  effusions, greater on the left, both increased.  Bilateral lower lobe atelectasis.  Lumbar lower thoracic spine degenerative changes.  IMPRESSION:  1.  Moderate right hydroureteronephrosis to the level of the mid right ureter in the upper pelvis.  There is interval resolution of extraluminal gas in that region with an interval probable small hematoma or abscess and mildly progressive extraureteral fluid. These findings are concerning for obstruction of the mid right ureter with perforation of the ureter with possible associated infection and developing abscess.  2.  Mild left hydroureteronephrosis to the level of the urinary bladder with mild progression. 3.  Interval suggestion of mild enlargement of the right ovary. This appearance was not previously present.  Therefore, this is most likely representative of increased fluid in this region, rather than true ovarian enlargement. 4.  Status post appendectomy. 5.  Mildly progressive bilateral pleural fluid with associated mild bilateral lower lobe atelectasis.  Original Report Authenticated By: Darrol Angel, M.D.    Assessment/Plan:  Ms. Walczyk is clinically improved with regard to her pain but unfortunately has had some slight worsening in her overall renal function. The etiology of her hydronephrosis is unclear. Initially she seemed to have predominantly left-sided hydronephrosis but recently it appears that the right side is more affected. The radiologist had raised the question of possible ureteral perforation. This would be exceedingly uncommon in  the face of no recent instrumentation or abdominal trauma. In addition any disruption in the ureter should result in a large urinoma/fluid collection rather than a small amount of air in the retroperitoneum. Air in the retroperitoneum is much more consistent with perforation from a GI source. In any event the air in the retroperitoneum appears to have resolved at this point. My hope was that Ms. Nesmith is renal function would continue to improve to the point that we could observe her or potentially obtain a repeat CT with intravenous contrast to further assess the ureters. Because her renal function has failed to improve we are going to need to proceed with an endoscopic assessment of her ureters with cystoscopy and bilateral retrograde pyelography. We will need to consider placement of double-J stent 6 at least temporarily. I will discuss this with her later today. We will hope to get her on the schedule sometime in the next 48-72 hours.   LOS: 9 days   Decker Cogdell S 04/26/2011, 10:08 AM

## 2011-04-26 NOTE — Progress Notes (Signed)
Subjective: Says abdomen does not hurt today, best she has felt in a week, having bm tol diet  Objective: Vital signs in last 24 hours: Temp:  [98.3 F (36.8 C)-99.5 F (37.5 C)] 98.3 F (36.8 C) (02/18 0515) Pulse Rate:  [72-89] 85  (02/18 0515) Resp:  [17-18] 18  (02/18 0515) BP: (126-146)/(75-77) 126/77 mmHg (02/18 0515) SpO2:  [96 %-99 %] 96 % (02/18 0515) Last BM Date: 04/25/11  Intake/Output from previous day: 02/17 0701 - 02/18 0700 In: 4638.3 [P.O.:720; I.V.:3918.3] Out: 1900 [Urine:1900] Intake/Output this shift:    GI: soft nontender  Lab Results:   Basename 04/26/11 0328 04/25/11 0335  WBC 12.2* 14.9*  HGB 8.1* 8.0*  HCT 24.3* 24.2*  PLT 304 276   BMET  Basename 04/26/11 0328 04/25/11 0335  NA 125* 126*  K 4.5 4.2  CL 99 99  CO2 19 21  GLUCOSE 133* 167*  BUN 28* 26*  CREATININE 1.90* 1.80*  CALCIUM 8.1* 8.0*    Studies/Results: Ct Abdomen Pelvis Wo Contrast  04/25/2011  *RADIOLOGY REPORT*  Clinical Data: Urinary tract infection.  Progressive leukocytosis despite antibiotics.  Recently demonstrated small amount of gas adjacent to the mid right ureter with right greater than left hydronephrosis.  Clinical concern for developing abscess.  CT ABDOMEN AND PELVIS WITHOUT CONTRAST  Technique:  Multidetector CT imaging of the abdomen and pelvis was performed following the standard protocol without intravenous contrast.  Comparison: 04/21/2011.  Findings: Mildly progressive dilatation of the right renal collecting system., currently moderately dilated.  Mild dilatation of the left renal collecting system with mild progression.  The left ureter is mildly dilated to the level of the urinary bladder with no obstructing calculus seen.  There is a Foley catheter in the bladder with no urine seen in the bladder.  The proximal right ureter is moderately dilated to the level of the mid ureter in the upper pelvis, where the previously demonstrated small amount of gas was  seen.  Currently, no gas is seen in that region.  However, there has been interval development of an oval area of mildly increased density in that area, measuring 2.6 x 2.1 cm in maximum dimensions on image number 55.  This measures 63 HU in density on image.  There is minimal progression of adjacent extraluminal fluid.  This includes a fluid collection on the right measuring 5.9 x 3.1 cm in maximum dimensions on image number 48, previously measuring 5.6 x 2.4 cm in maximum dimensions.  The appendix is not visualized and is surgically absent by history. No gastrointestinal abnormalities are seen.  No enlarged lymph nodes.  Atheromatous arterial calcifications, including intrasplenic arteries.  Unremarkable liver.  Mild nodularity of the left adrenal gland measuring near fluid density, compatible with two small adenomas.  Unremarkable right adrenal gland, uterus and left ovary.  The right ovary is mildly enlarged for the age of the patient, measuring 4.2 x 2.4 cm in maximum dimensions on image number 62.  Small bilateral pleural effusions, greater on the left, both increased.  Bilateral lower lobe atelectasis.  Lumbar lower thoracic spine degenerative changes.  IMPRESSION:  1.  Moderate right hydroureteronephrosis to the level of the mid right ureter in the upper pelvis.  There is interval resolution of extraluminal gas in that region with an interval probable small hematoma or abscess and mildly progressive extraureteral fluid. These findings are concerning for obstruction of the mid right ureter with perforation of the ureter with possible associated infection and developing abscess.  2.  Mild left hydroureteronephrosis to the level of the urinary bladder with mild progression. 3.  Interval suggestion of mild enlargement of the right ovary. This appearance was not previously present.  Therefore, this is most likely representative of increased fluid in this region, rather than true ovarian enlargement. 4.  Status  post appendectomy. 5.  Mildly progressive bilateral pleural fluid with associated mild bilateral lower lobe atelectasis.  Original Report Authenticated By: Darrol Angel, M.D.     Assessment/Plan: Pyelonephritis WBC improving with therapy, CT reviewed, exam nontender today.  I do not think is GI source.  Will sign off, please call back if any questions    LOS: 9 days    [facility] 04/26/2011

## 2011-04-26 NOTE — Progress Notes (Signed)
INFECTIOUS DISEASE PROGRESS NOTE  ID: Kathryn [name] is a 78 y.o. female who came in 2/9 with fever and chills and work up revealed Klebsiella bacteremia.  She most recently has been on ceftriaxone and is day # 11 of total antibiotics.  She was evaluated by Urology due to the CT abdomen findings and is to undergo endoscopic assessment.    Subjective: Feels overall improved  Abtx:  Anti-infectives     Start     Dose/Rate Route Frequency Ordered Stop   04/20/11 1400   cefTRIAXone (ROCEPHIN) 1 g in dextrose 5 % 50 mL IVPB        1 g 100 mL/hr over 30 Minutes Intravenous Every 24 hours 04/20/11 1337     04/19/11 1200   imipenem-cilastatin (PRIMAXIN) 250 mg in sodium chloride 0.9 % 100 mL IVPB  Status:  Discontinued        250 mg 200 mL/hr over 30 Minutes Intravenous 4 times per day 04/19/11 1045 04/20/11 1337   04/18/11 2200   cefTRIAXone (ROCEPHIN) 1 g in dextrose 5 % 50 mL IVPB  Status:  Discontinued        1 g 100 mL/hr over 30 Minutes Intravenous Daily at bedtime 04/18/11 0020 04/19/11 0913   04/18/11 0000   cefTRIAXone (ROCEPHIN) 1 g in dextrose 5 % 50 mL IVPB  Status:  Discontinued        1 g 100 mL/hr over 30 Minutes Intravenous Every 24 hours 04/17/11 2217 04/18/11 0020   04/17/11 2015   cefTRIAXone (ROCEPHIN) 1 g in dextrose 5 % 50 mL IVPB        1 g 100 mL/hr over 30 Minutes Intravenous  Once 04/17/11 2002 04/17/11 2307          Medications: I have reviewed the patient's current medications.  Objective: Vital signs in last 24 hours: Temp:  [98.3 F (36.8 C)-99.5 F (37.5 C)] 98.3 F (36.8 C) (02/18 0515) Pulse Rate:  [72-89] 85  (02/18 0515) Resp:  [17-18] 18  (02/18 0515) BP: (126-146)/(75-77) 126/77 mmHg (02/18 0515) SpO2:  [96 %-99 %] 96 % (02/18 0515)   General appearance: alert, cooperative and no distress Resp: clear to auscultation bilaterally Cardio: regular rate and rhythm, S1, S2 normal, no murmur, click, rub or gallop GI: soft, non-tender; bowel  sounds normal; no masses,  no organomegaly  Lab Results  [facility] 04/26/11 0328 04/25/11 0335  WBC 12.2* 14.9*  HGB 8.1* 8.0*  HCT 24.3* 24.2*  NA 125* 126*  K 4.5 4.2  CL 99 99  CO2 19 21  BUN 28* 26*  CREATININE 1.90* 1.80*  GLU -- --   Liver Panel  Basename 04/26/11 0328 04/25/11 0335  PROT 5.0* 4.8*  ALBUMIN 1.6* 1.5*  AST 31 26  ALT 26 24  ALKPHOS 448* 385*  BILITOT 0.2* 0.3  BILIDIR -- --  IBILI -- --   Sedimentation Rate No results found for this basename: ESRSEDRATE in the last 72 hours C-Reactive Protein No results found for this basename: CRP:2 in the last 72 hours  Microbiology: Recent Results (from the past 240 hour(s))  CULTURE, BLOOD (ROUTINE X 2)     Status: Normal   Collection Time   04/17/11  6:45 PM      Component Value Range Status Comment   Specimen Description BLOOD RIGHT HAND   Final    Special Requests BOTTLES DRAWN AEROBIC AND ANAEROBIC 5 CC EACH   Final    Culture  Setup Time  [id number]   Final    Culture     Final    Value: KLEBSIELLA PNEUMONIAE     Note: Gram Stain Report Called to,Read Back By and Verified With: [facility] MILLER 04/22/11 1535 BY SMITHERSJ   Report Status 04/24/2011 FINAL   Final    Organism ID, Bacteria KLEBSIELLA PNEUMONIAE   Final   URINE CULTURE     Status: Normal   Collection Time   04/17/11  7:31 PM      Component Value Range Status Comment   Specimen Description URINE, CATHETERIZED   Final    Special Requests NONE   Final    Culture  Setup Time [id number]   Final    Colony Count 85,000 COLONIES/ML   Final    Culture KLEBSIELLA PNEUMONIAE   Final    Report Status 04/19/2011 FINAL   Final    Organism ID, Bacteria KLEBSIELLA PNEUMONIAE   Final   CULTURE, BLOOD (ROUTINE X 2)     Status: Normal   Collection Time   04/17/11  9:50 PM      Component Value Range Status Comment   Specimen Description BLOOD LEFT ARM   Final    Special Requests BOTTLES DRAWN AEROBIC AND ANAEROBIC 5CC   Final    Culture  Setup Time  [id number]   Final    Culture     Final    Value: KLEBSIELLA PNEUMONIAE     Note: CRITICAL RESULT CALLED TO, READ BACK BY AND VERIFIED WITH: DEBRA ANDERSON @ 840PM BY VINCJ 04/18/11   Report Status 04/20/2011 FINAL   Final    Organism ID, Bacteria KLEBSIELLA PNEUMONIAE   Final   MRSA PCR SCREENING     Status: Normal   Collection Time   04/18/11 12:17 AM      Component Value Range Status Comment   MRSA by PCR NEGATIVE  NEGATIVE  Final   CULTURE, BLOOD (ROUTINE X 2)     Status: Normal   Collection Time   04/19/11  9:50 AM      Component Value Range Status Comment   Specimen Description BLOOD LEFT HAND   Final    Special Requests BOTTLES DRAWN AEROBIC AND ANAEROBIC 10CC   Final    Culture  Setup Time [id number]   Final    Culture NO GROWTH 5 DAYS   Final    Report Status 04/25/2011 FINAL   Final   CULTURE, BLOOD (ROUTINE X 2)     Status: Normal   Collection Time   04/19/11 10:00 AM      Component Value Range Status Comment   Specimen Description BLOOD LEFT ARM   Final    Special Requests BOTTLES DRAWN AEROBIC AND ANAEROBIC 10CC   Final    Culture  Setup Time [id number]   Final    Culture NO GROWTH 5 DAYS   Final    Report Status 04/25/2011 FINAL   Final     Studies/Results: Ct Abdomen Pelvis Wo Contrast  04/25/2011  *RADIOLOGY REPORT*  Clinical Data: Urinary tract infection.  Progressive leukocytosis despite antibiotics.  Recently demonstrated small amount of gas adjacent to the mid right ureter with right greater than left hydronephrosis.  Clinical concern for developing abscess.  CT ABDOMEN AND PELVIS WITHOUT CONTRAST  Technique:  Multidetector CT imaging of the abdomen and pelvis was performed following the standard protocol without intravenous contrast.  Comparison: 04/21/2011.  Findings: Mildly progressive dilatation of the right renal collecting system., currently moderately dilated.  Mild dilatation  of the left renal collecting system with mild progression.  The left ureter is  mildly dilated to the level of the urinary bladder with no obstructing calculus seen.  There is a Foley catheter in the bladder with no urine seen in the bladder.  The proximal right ureter is moderately dilated to the level of the mid ureter in the upper pelvis, where the previously demonstrated small amount of gas was seen.  Currently, no gas is seen in that region.  However, there has been interval development of an oval area of mildly increased density in that area, measuring 2.6 x 2.1 cm in maximum dimensions on image number 55.  This measures 63 HU in density on image.  There is minimal progression of adjacent extraluminal fluid.  This includes a fluid collection on the right measuring 5.9 x 3.1 cm in maximum dimensions on image number 48, previously measuring 5.6 x 2.4 cm in maximum dimensions.  The appendix is not visualized and is surgically absent by history. No gastrointestinal abnormalities are seen.  No enlarged lymph nodes.  Atheromatous arterial calcifications, including intrasplenic arteries.  Unremarkable liver.  Mild nodularity of the left adrenal gland measuring near fluid density, compatible with two small adenomas.  Unremarkable right adrenal gland, uterus and left ovary.  The right ovary is mildly enlarged for the age of the patient, measuring 4.2 x 2.4 cm in maximum dimensions on image number 62.  Small bilateral pleural effusions, greater on the left, both increased.  Bilateral lower lobe atelectasis.  Lumbar lower thoracic spine degenerative changes.  IMPRESSION:  1.  Moderate right hydroureteronephrosis to the level of the mid right ureter in the upper pelvis.  There is interval resolution of extraluminal gas in that region with an interval probable small hematoma or abscess and mildly progressive extraureteral fluid. These findings are concerning for obstruction of the mid right ureter with perforation of the ureter with possible associated infection and developing abscess.  2.  Mild left  hydroureteronephrosis to the level of the urinary bladder with mild progression. 3.  Interval suggestion of mild enlargement of the right ovary. This appearance was not previously present.  Therefore, this is most likely representative of increased fluid in this region, rather than true ovarian enlargement. 4.  Status post appendectomy. 5.  Mildly progressive bilateral pleural fluid with associated mild bilateral lower lobe atelectasis.  Original Report Authenticated By: Darrol Angel, M.D.     Assessment/Plan: 1) Pyelonephritis - ? Abscess, to undergo closer evaluation by urology.  She should continue with ceftriaxone.    [facility] Ceasar Infectious Diseases 04/26/2011, 12:52 PM

## 2011-04-26 NOTE — Progress Notes (Signed)
Subjective: Feels a little better. Ate breakfast without trouble. No chills or sweats. 2 BM last night  Objective: Vital signs in last 24 hours: Temp:  [98.3 F (36.8 C)-99.5 F (37.5 C)] 98.3 F (36.8 C) (02/18 0515) Pulse Rate:  [72-89] 85  (02/18 0515) Resp:  [17-18] 18  (02/18 0515) BP: (126-146)/(75-77) 126/77 mmHg (02/18 0515) SpO2:  [96 %-99 %] 96 % (02/18 0515)  Intake/Output from previous day: 02/17 0701 - 02/18 0700 In: 4638.3 [P.O.:720; I.V.:3918.3] Out: 1900 [Urine:1900] Intake/Output this shift:    General: fatigued no distress non toxic, anicteric, oral membranes moist. Lungs clear. Ht IR/IR with murmur. Abdomen obese soft sl tender but no rebound. Hypoactive BS. Alert awake, mentating well, no tremor   Lab Results   Basename 04/26/11 0328 04/25/11 0335  WBC 12.2* 14.9*  RBC 2.59* 2.60*  HGB 8.1* 8.0*  HCT 24.3* 24.2*  MCV 93.8 93.1  MCH 31.3 30.8  RDW 16.8* 16.6*  PLT 304 276    Basename 04/26/11 0328 04/25/11 0335  NA 125* 126*  K 4.5 4.2  CL 99 99  CO2 19 21  GLUCOSE 133* 167*  BUN 28* 26*  CREATININE 1.90* 1.80*  CALCIUM 8.1* 8.0*    Studies/Results: Ct Abdomen Pelvis Wo Contrast  04/25/2011  *RADIOLOGY REPORT*  Clinical Data: Urinary tract infection.  Progressive leukocytosis despite antibiotics.  Recently demonstrated small amount of gas adjacent to the mid right ureter with right greater than left hydronephrosis.  Clinical concern for developing abscess.  CT ABDOMEN AND PELVIS WITHOUT CONTRAST  Technique:  Multidetector CT imaging of the abdomen and pelvis was performed following the standard protocol without intravenous contrast.  Comparison: 04/21/2011.  Findings: Mildly progressive dilatation of the right renal collecting system., currently moderately dilated.  Mild dilatation of the left renal collecting system with mild progression.  The left ureter is mildly dilated to the level of the urinary bladder with no obstructing calculus seen.   There is a Foley catheter in the bladder with no urine seen in the bladder.  The proximal right ureter is moderately dilated to the level of the mid ureter in the upper pelvis, where the previously demonstrated small amount of gas was seen.  Currently, no gas is seen in that region.  However, there has been interval development of an oval area of mildly increased density in that area, measuring 2.6 x 2.1 cm in maximum dimensions on image number 55.  This measures 63 HU in density on image.  There is minimal progression of adjacent extraluminal fluid.  This includes a fluid collection on the right measuring 5.9 x 3.1 cm in maximum dimensions on image number 48, previously measuring 5.6 x 2.4 cm in maximum dimensions.  The appendix is not visualized and is surgically absent by history. No gastrointestinal abnormalities are seen.  No enlarged lymph nodes.  Atheromatous arterial calcifications, including intrasplenic arteries.  Unremarkable liver.  Mild nodularity of the left adrenal gland measuring near fluid density, compatible with two small adenomas.  Unremarkable right adrenal gland, uterus and left ovary.  The right ovary is mildly enlarged for the age of the patient, measuring 4.2 x 2.4 cm in maximum dimensions on image number 62.  Small bilateral pleural effusions, greater on the left, both increased.  Bilateral lower lobe atelectasis.  Lumbar lower thoracic spine degenerative changes.  IMPRESSION:  1.  Moderate right hydroureteronephrosis to the level of the mid right ureter in the upper pelvis.  There is interval resolution of extraluminal gas  in that region with an interval probable small hematoma or abscess and mildly progressive extraureteral fluid. These findings are concerning for obstruction of the mid right ureter with perforation of the ureter with possible associated infection and developing abscess.  2.  Mild left hydroureteronephrosis to the level of the urinary bladder with mild progression. 3.   Interval suggestion of mild enlargement of the right ovary. This appearance was not previously present.  Therefore, this is most likely representative of increased fluid in this region, rather than true ovarian enlargement. 4.  Status post appendectomy. 5.  Mildly progressive bilateral pleural fluid with associated mild bilateral lower lobe atelectasis.  Original Report Authenticated By: Darrol Angel, M.D.    Scheduled Meds:   . cefTRIAXone (ROCEPHIN)  IV  1 g Intravenous Q24H  . darifenacin  7.5 mg Oral Daily  . ferrous sulfate  325 mg Oral Q breakfast  . insulin aspart  0-15 Units Subcutaneous TID WC  . insulin aspart  5 Units Subcutaneous TID WC  . insulin glargine  30 Units Subcutaneous QHS  . metoprolol tartrate  25 mg Oral BID  . multivitamin  1 tablet Oral Daily  . pantoprazole  40 mg Oral Daily  . polyethylene glycol  17 g Oral BID  . protein supplement  6 g Oral TID WC  . rivaroxaban  15 mg Oral Daily  . rosuvastatin  20 mg Oral Daily  . senna-docusate  2 tablet Oral QHS   Continuous Infusions:   . sodium chloride 125 mL/hr at 04/26/11 0611   PRN Meds:acetaminophen, bisacodyl, hydrALAZINE, HYDROcodone-acetaminophen, magic mouthwash, menthol-cetylpyridinium, morphine, ondansetron (ZOFRAN) IV, ondansetron, zolpidem  Assessment/Plan:  PYELONEPHRITIS/SEPSIS FROM A URINARY SOURCE: Klebsiella in blood and urine. Now on rocephin. No fever. Continue IV Abx for now. Needs 14 days overall . CT report questions a perforation of the ureter. Collecting system continues to be dilated and is slowly progressive. Will get urology to weigh in on these ideas. ABDOMINAL PAIN/VOMITING : Sl better. Genl Surg help appreciated RENAL INSUFFICIENCY: sl worse at 1.90. Going back in the wrong direction HYDROURETER: See above ATRIAL FIBRILLATION: Stable rate  DM 2: FBS better at 115 HYPERTENSION: BP reasonable overall  ANEMIA: A lttle worse at 8.1 HYPONATREMIA: ? Due to nausea. Back down to  125 CONSTIPATION:making progress DISP: Will be going back to SNF    LOS: 9 days   Na Waldrip [name] 04/26/2011, 9:34 AM

## 2011-04-26 NOTE — Progress Notes (Signed)
PT Cancellation Note  ___Treatment cancelled today due to medical issues with patient which prohibited   therapy  ___ Treatment cancelled today due to patient receiving procedure or test   _X_ Treatment cancelled today due to patient's refusal to participate ........attempted X 2....Marland Kitchenam wanted to sleep, pm was too fatigued c/o multiple loose BM's  ___ Treatment cancelled today due to   Felecia Shelling  PTA [facility]  Acute  Rehab Pager     [phone]

## 2011-04-27 LAB — COMPREHENSIVE METABOLIC PANEL
Albumin: 1.6 g/dL — ABNORMAL LOW (ref 3.5–5.2)
BUN: 27 mg/dL — ABNORMAL HIGH (ref 6–23)
Chloride: 102 mEq/L (ref 96–112)
Creatinine, Ser: 2.06 mg/dL — ABNORMAL HIGH (ref 0.50–1.10)
GFR calc Af Amer: 26 mL/min — ABNORMAL LOW (ref >90)
GFR calc non Af Amer: 22 mL/min — ABNORMAL LOW (ref >90)
Glucose, Bld: 135 mg/dL — ABNORMAL HIGH (ref 70–99)
Total Bilirubin: 0.2 mg/dL — ABNORMAL LOW (ref 0.3–1.2)

## 2011-04-27 LAB — GLUCOSE, CAPILLARY
Glucose-Capillary: 112 mg/dL — ABNORMAL HIGH (ref 70–99)
Glucose-Capillary: 130 mg/dL — ABNORMAL HIGH (ref 70–99)

## 2011-04-27 LAB — CBC
HCT: 24.3 % — ABNORMAL LOW (ref 36.0–46.0)
Hemoglobin: 8 g/dL — ABNORMAL LOW (ref 12.0–15.0)
MCV: 93.5 fL (ref 78.0–100.0)
RDW: 16.6 % — ABNORMAL HIGH (ref 11.5–15.5)
WBC: 12.3 10*3/uL — ABNORMAL HIGH (ref 4.0–10.5)

## 2011-04-27 NOTE — Progress Notes (Signed)
OT Cancellation Note  ___Treatment cancelled today due to medical issues with patient which prohibited therapy  ___ Treatment cancelled today due to patient receiving procedure or test   _x__ Treatment cancelled today due to patient's refusal to participate. Stated she was too fatigued and in too much pain. Therapist stressed to pt the importance of getting oob. Pt continued to refuse. Will check back another day.  ___ Treatment cancelled today due to   Signature: Garrel Ridgel, OTR/L  Pager [phone] 04/27/2011

## 2011-04-27 NOTE — Progress Notes (Signed)
PT Cancellation Note  ___Treatment cancelled today due to medical issues with patient which prohibited therapy  ___ Treatment cancelled today due to patient receiving procedure or test   _X_ Treatment cancelled today due to patient's refusal to participate.......strongly encouraged pt to at least get OOB to the chair.....had pain meds just recently....the patient stated she was too tiered, c/o not getting enough rest, feels too weak and then she said she was going to have surgery in the next day or so and wanted to rest for that.  Will continue to check back with her as schedule permits.  ___ Treatment cancelled today due to  Kathryn [name]  PTA [facility]  Acute  Rehab Pager     [phone]

## 2011-04-27 NOTE — Progress Notes (Signed)
Subjective: Some nausea. Less abd pain.  Objective: Vital signs in last 24 hours: Temp:  [98.1 F (36.7 C)-98.7 F (37.1 C)] 98.7 F (37.1 C) (02/19 0556) Pulse Rate:  [72-97] 97  (02/19 0910) Resp:  [16] 16  (02/19 0556) BP: (126-154)/(65-86) 154/74 mmHg (02/19 0910) SpO2:  [97 %-99 %] 97 % (02/19 0556)  Intake/Output from previous day: 02/18 0701 - 02/19 0700 In: 2686.4 [P.O.:1120; I.V.:1566.4] Out: 1665 [Urine:1665] Intake/Output this shift:    General: alert and fatiguedno distress non toxic, anicteric, oral membranes moist. Lungs clear. Ht IR/IR with murmur. Abdomen obese soft sl tender but no rebound. Hypoactive BS. Alert awake, mentating well, no tremor   Lab Results   [facility] 04/27/11 0347 04/26/11 0328  WBC 12.3* 12.2*  RBC 2.60* 2.59*  HGB 8.0* 8.1*  HCT 24.3* 24.3*  MCV 93.5 93.8  MCH 30.8 31.3  RDW 16.6* 16.8*  PLT 317 304    Basename 04/27/11 0347 04/26/11 0328  NA 129* 125*  K 4.3 4.5  CL 102 99  CO2 20 19  GLUCOSE 135* 133*  BUN 27* 28*  CREATININE 2.06* 1.90*  CALCIUM 8.3* 8.1*    Studies/Results: Ct Abdomen Pelvis Wo Contrast  04/25/2011  *RADIOLOGY REPORT*  Clinical Data: Urinary tract infection.  Progressive leukocytosis despite antibiotics.  Recently demonstrated small amount of gas adjacent to the mid right ureter with right greater than left hydronephrosis.  Clinical concern for developing abscess.  CT ABDOMEN AND PELVIS WITHOUT CONTRAST  Technique:  Multidetector CT imaging of the abdomen and pelvis was performed following the standard protocol without intravenous contrast.  Comparison: 04/21/2011.  Findings: Mildly progressive dilatation of the right renal collecting system., currently moderately dilated.  Mild dilatation of the left renal collecting system with mild progression.  The left ureter is mildly dilated to the level of the urinary bladder with no obstructing calculus seen.  There is a Foley catheter in the bladder with no urine seen  in the bladder.  The proximal right ureter is moderately dilated to the level of the mid ureter in the upper pelvis, where the previously demonstrated small amount of gas was seen.  Currently, no gas is seen in that region.  However, there has been interval development of an oval area of mildly increased density in that area, measuring 2.6 x 2.1 cm in maximum dimensions on image number 55.  This measures 63 HU in density on image.  There is minimal progression of adjacent extraluminal fluid.  This includes a fluid collection on the right measuring 5.9 x 3.1 cm in maximum dimensions on image number 48, previously measuring 5.6 x 2.4 cm in maximum dimensions.  The appendix is not visualized and is surgically absent by history. No gastrointestinal abnormalities are seen.  No enlarged lymph nodes.  Atheromatous arterial calcifications, including intrasplenic arteries.  Unremarkable liver.  Mild nodularity of the left adrenal gland measuring near fluid density, compatible with two small adenomas.  Unremarkable right adrenal gland, uterus and left ovary.  The right ovary is mildly enlarged for the age of the patient, measuring 4.2 x 2.4 cm in maximum dimensions on image number 62.  Small bilateral pleural effusions, greater on the left, both increased.  Bilateral lower lobe atelectasis.  Lumbar lower thoracic spine degenerative changes.  IMPRESSION:  1.  Moderate right hydroureteronephrosis to the level of the mid right ureter in the upper pelvis.  There is interval resolution of extraluminal gas in that region with an interval probable small hematoma or  abscess and mildly progressive extraureteral fluid. These findings are concerning for obstruction of the mid right ureter with perforation of the ureter with possible associated infection and developing abscess.  2.  Mild left hydroureteronephrosis to the level of the urinary bladder with mild progression. 3.  Interval suggestion of mild enlargement of the right ovary.  This appearance was not previously present.  Therefore, this is most likely representative of increased fluid in this region, rather than true ovarian enlargement. 4.  Status post appendectomy. 5.  Mildly progressive bilateral pleural fluid with associated mild bilateral lower lobe atelectasis.  Original Report Authenticated By: Darrol Angel, M.D.    Scheduled Meds:   . cefTRIAXone (ROCEPHIN)  IV  1 g Intravenous Q24H  . darifenacin  7.5 mg Oral Daily  . ferrous sulfate  325 mg Oral Q breakfast  . insulin aspart  0-15 Units Subcutaneous TID WC  . insulin aspart  5 Units Subcutaneous TID WC  . insulin glargine  30 Units Subcutaneous QHS  . metoprolol tartrate  25 mg Oral BID  . multivitamin  1 tablet Oral Daily  . pantoprazole  40 mg Oral Daily  . polyethylene glycol  17 g Oral BID  . protein supplement  6 g Oral TID WC  . rivaroxaban  15 mg Oral Daily  . rosuvastatin  20 mg Oral Daily  . senna-docusate  2 tablet Oral QHS   Continuous Infusions:   . sodium chloride 1,000 mL (04/27/11 0743)   PRN Meds:acetaminophen, bisacodyl, hydrALAZINE, HYDROcodone-acetaminophen, magic mouthwash, menthol-cetylpyridinium, morphine, ondansetron (ZOFRAN) IV, ondansetron, zolpidem  Assessment/Plan:  PYELONEPHRITIS/SEPSIS FROM A URINARY SOURCE: Klebsiella in blood and urine. Now on rocephin. No fever. Continue IV Abx for now. Needs 14 days overall . CT report questions a perforation of the ureter. Collecting system continues to be dilated and is slowly progressive. Procedure planned ABDOMINAL PAIN/VOMITING : Sl better. Genl Surg help appreciated  RENAL INSUFFICIENCY: sl worse at 2.06. Going back in the wrong direction  HYDROURETER: See above  ATRIAL FIBRILLATION: Stable rate  DM 2: FBS better at 135  HYPERTENSION: BP reasonable overall  ANEMIA: A lttle worse at 8.0 HYPONATREMIA: ? Due to nausea. Sl better at 129 CONSTIPATION:making progress  DISP: Will be going back to SNF   LOS: 10 days    Cianni Manny [name] 04/27/2011, 9:42 AM

## 2011-04-27 NOTE — Progress Notes (Signed)
CSW continues to follow for return to SNF. Need FL2 signed. Pt to return to [facility]. CSW will cont. To follow and assist with return. Kathryn [name] [facility] 04/27/2011 10:38 AM [phone]

## 2011-04-27 NOTE — Progress Notes (Signed)
Subjective: No new complaints, other than being tired   Antibiotics:  Anti-infectives     Start     Dose/Rate Route Frequency Ordered Stop   04/20/11 1400   cefTRIAXone (ROCEPHIN) 1 g in dextrose 5 % 50 mL IVPB        1 g 100 mL/hr over 30 Minutes Intravenous Every 24 hours 04/20/11 1337     04/19/11 1200   imipenem-cilastatin (PRIMAXIN) 250 mg in sodium chloride 0.9 % 100 mL IVPB  Status:  Discontinued        250 mg 200 mL/hr over 30 Minutes Intravenous 4 times per day 04/19/11 1045 04/20/11 1337   04/18/11 2200   cefTRIAXone (ROCEPHIN) 1 g in dextrose 5 % 50 mL IVPB  Status:  Discontinued        1 g 100 mL/hr over 30 Minutes Intravenous Daily at bedtime 04/18/11 0020 04/19/11 0913   04/18/11 0000   cefTRIAXone (ROCEPHIN) 1 g in dextrose 5 % 50 mL IVPB  Status:  Discontinued        1 g 100 mL/hr over 30 Minutes Intravenous Every 24 hours 04/17/11 2217 04/18/11 0020   04/17/11 2015   cefTRIAXone (ROCEPHIN) 1 g in dextrose 5 % 50 mL IVPB        1 g 100 mL/hr over 30 Minutes Intravenous  Once 04/17/11 2002 04/17/11 2307          Medications: Scheduled Meds:   . cefTRIAXone (ROCEPHIN)  IV  1 g Intravenous Q24H  . darifenacin  7.5 mg Oral Daily  . ferrous sulfate  325 mg Oral Q breakfast  . insulin aspart  0-15 Units Subcutaneous TID WC  . insulin aspart  5 Units Subcutaneous TID WC  . insulin glargine  30 Units Subcutaneous QHS  . metoprolol tartrate  25 mg Oral BID  . multivitamin  1 tablet Oral Daily  . pantoprazole  40 mg Oral Daily  . protein supplement  6 g Oral TID WC  . rivaroxaban  15 mg Oral Daily  . rosuvastatin  20 mg Oral Daily  . DISCONTD: polyethylene glycol  17 g Oral BID  . DISCONTD: senna-docusate  2 tablet Oral QHS   Continuous Infusions:   . sodium chloride 1,000 mL (04/27/11 0743)   PRN Meds:.acetaminophen, bisacodyl, hydrALAZINE, HYDROcodone-acetaminophen, magic mouthwash, menthol-cetylpyridinium, morphine, ondansetron (ZOFRAN) IV, ondansetron,  zolpidem   Objective: Weight change:   Intake/Output Summary (Last 24 hours) at 04/27/11 1004 Last data filed at 04/27/11 0640  Gross per 24 hour  Intake 2286.36 ml  Output   1665 ml  Net 621.36 ml   Blood pressure 154/74, pulse 97, temperature 98.7 F (37.1 C), temperature source Oral, resp. rate 16, height 5\' 9"  (1.753 m), weight 233 lb 7.5 oz (105.9 kg), SpO2 97.00%. Temp:  [98.1 F (36.7 C)-98.7 F (37.1 C)] 98.7 F (37.1 C) (02/19 0556) Pulse Rate:  [72-97] 97  (02/19 0910) Resp:  [16] 16  (02/19 0556) BP: (126-154)/(65-86) 154/74 mmHg (02/19 0910) SpO2:  [97 %-99 %] 97 % (02/19 0556)  Physical Exam: General: Alert and awake, oriented x3, not in any acute distress. HEENT: anicteric sclera, pupils reactive to light and accommodation, EOMI CVS regular rate, normal r,  no murmur rubs or gallops Chest: clear to auscultation bilaterally, no wheezing, rales or rhonchi Abdomen: soft nontender, nondistended, normal bowel sounds, Extremities: no  clubbing or edema noted bilaterally Skin: no rashes Lymph: no new lymphadenopathy Neuro: nonfocal  Lab Results:  Basename 04/27/11 0347 04/26/11  0328  WBC 12.3* 12.2*  HGB 8.0* 8.1*  HCT 24.3* 24.3*  PLT 317 304    BMET  Basename 04/27/11 0347 04/26/11 0328  NA 129* 125*  K 4.3 4.5  CL 102 99  CO2 20 19  GLUCOSE 135* 133*  BUN 27* 28*  CREATININE 2.06* 1.90*  CALCIUM 8.3* 8.1*    Micro Results: Recent Results (from the past 240 hour(s))  CULTURE, BLOOD (ROUTINE X 2)     Status: Normal   Collection Time   04/17/11  6:45 PM      Component Value Range Status Comment   Specimen Description BLOOD RIGHT HAND   Final    Special Requests BOTTLES DRAWN AEROBIC AND ANAEROBIC 5 CC EACH   Final    Culture  Setup Time [id number]   Final    Culture     Final    Value: KLEBSIELLA PNEUMONIAE     Note: Gram Stain Report Called to,Read Back By and Verified With: [facility] MILLER 04/22/11 1535 BY SMITHERSJ   Report Status 04/24/2011  FINAL   Final    Organism ID, Bacteria KLEBSIELLA PNEUMONIAE   Final   URINE CULTURE     Status: Normal   Collection Time   04/17/11  7:31 PM      Component Value Range Status Comment   Specimen Description URINE, CATHETERIZED   Final    Special Requests NONE   Final    Culture  Setup Time [id number]   Final    Colony Count 85,000 COLONIES/ML   Final    Culture KLEBSIELLA PNEUMONIAE   Final    Report Status 04/19/2011 FINAL   Final    Organism ID, Bacteria KLEBSIELLA PNEUMONIAE   Final   CULTURE, BLOOD (ROUTINE X 2)     Status: Normal   Collection Time   04/17/11  9:50 PM      Component Value Range Status Comment   Specimen Description BLOOD LEFT ARM   Final    Special Requests BOTTLES DRAWN AEROBIC AND ANAEROBIC 5CC   Final    Culture  Setup Time [id number]   Final    Culture     Final    Value: KLEBSIELLA PNEUMONIAE     Note: CRITICAL RESULT CALLED TO, READ BACK BY AND VERIFIED WITH: DEBRA ANDERSON @ 840PM BY VINCJ 04/18/11   Report Status 04/20/2011 FINAL   Final    Organism ID, Bacteria KLEBSIELLA PNEUMONIAE   Final   MRSA PCR SCREENING     Status: Normal   Collection Time   04/18/11 12:17 AM      Component Value Range Status Comment   MRSA by PCR NEGATIVE  NEGATIVE  Final   CULTURE, BLOOD (ROUTINE X 2)     Status: Normal   Collection Time   04/19/11  9:50 AM      Component Value Range Status Comment   Specimen Description BLOOD LEFT HAND   Final    Special Requests BOTTLES DRAWN AEROBIC AND ANAEROBIC 10CC   Final    Culture  Setup Time [id number]   Final    Culture NO GROWTH 5 DAYS   Final    Report Status 04/25/2011 FINAL   Final   CULTURE, BLOOD (ROUTINE X 2)     Status: Normal   Collection Time   04/19/11 10:00 AM      Component Value Range Status Comment   Specimen Description BLOOD LEFT ARM   Final    Special Requests BOTTLES DRAWN AEROBIC  AND ANAEROBIC 10CC   Final    Culture  Setup Time [id number]   Final    Culture NO GROWTH 5 DAYS   Final    Report Status  04/25/2011 FINAL   Final     Studies/Results: Ct Abdomen Pelvis Wo Contrast  04/25/2011  *RADIOLOGY REPORT*  Clinical Data: Urinary tract infection.  Progressive leukocytosis despite antibiotics.  Recently demonstrated small amount of gas adjacent to the mid right ureter with right greater than left hydronephrosis.  Clinical concern for developing abscess.  CT ABDOMEN AND PELVIS WITHOUT CONTRAST  Technique:  Multidetector CT imaging of the abdomen and pelvis was performed following the standard protocol without intravenous contrast.  Comparison: 04/21/2011.  Findings: Mildly progressive dilatation of the right renal collecting system., currently moderately dilated.  Mild dilatation of the left renal collecting system with mild progression.  The left ureter is mildly dilated to the level of the urinary bladder with no obstructing calculus seen.  There is a Foley catheter in the bladder with no urine seen in the bladder.  The proximal right ureter is moderately dilated to the level of the mid ureter in the upper pelvis, where the previously demonstrated small amount of gas was seen.  Currently, no gas is seen in that region.  However, there has been interval development of an oval area of mildly increased density in that area, measuring 2.6 x 2.1 cm in maximum dimensions on image number 55.  This measures 63 HU in density on image.  There is minimal progression of adjacent extraluminal fluid.  This includes a fluid collection on the right measuring 5.9 x 3.1 cm in maximum dimensions on image number 48, previously measuring 5.6 x 2.4 cm in maximum dimensions.  The appendix is not visualized and is surgically absent by history. No gastrointestinal abnormalities are seen.  No enlarged lymph nodes.  Atheromatous arterial calcifications, including intrasplenic arteries.  Unremarkable liver.  Mild nodularity of the left adrenal gland measuring near fluid density, compatible with two small adenomas.  Unremarkable right  adrenal gland, uterus and left ovary.  The right ovary is mildly enlarged for the age of the patient, measuring 4.2 x 2.4 cm in maximum dimensions on image number 62.  Small bilateral pleural effusions, greater on the left, both increased.  Bilateral lower lobe atelectasis.  Lumbar lower thoracic spine degenerative changes.  IMPRESSION:  1.  Moderate right hydroureteronephrosis to the level of the mid right ureter in the upper pelvis.  There is interval resolution of extraluminal gas in that region with an interval probable small hematoma or abscess and mildly progressive extraureteral fluid. These findings are concerning for obstruction of the mid right ureter with perforation of the ureter with possible associated infection and developing abscess.  2.  Mild left hydroureteronephrosis to the level of the urinary bladder with mild progression. 3.  Interval suggestion of mild enlargement of the right ovary. This appearance was not previously present.  Therefore, this is most likely representative of increased fluid in this region, rather than true ovarian enlargement. 4.  Status post appendectomy. 5.  Mildly progressive bilateral pleural fluid with associated mild bilateral lower lobe atelectasis.  Original Report Authenticated By: Darrol Angel, M.D.      Assessment/Plan: Kathryn [name] is a 78 y.o. female with  kleb pneumonia complicated UTI with bacteremia, now with ? Perforation of right ureter based on findings on CT scan. She had gas collection on prior CT scan that has since resolved and there was  also consideration of pathology in her viscera given this finding --continue rocephin --followup results of her endoscopy   LOS: 10 days   Acey Lav 04/27/2011, 10:04 AM

## 2011-04-28 LAB — COMPREHENSIVE METABOLIC PANEL
ALT: 23 U/L (ref 0–35)
BUN: 27 mg/dL — ABNORMAL HIGH (ref 6–23)
CO2: 18 mEq/L — ABNORMAL LOW (ref 19–32)
Calcium: 8.1 mg/dL — ABNORMAL LOW (ref 8.4–10.5)
GFR calc Af Amer: 28 mL/min — ABNORMAL LOW (ref >90)
GFR calc non Af Amer: 24 mL/min — ABNORMAL LOW (ref >90)
Glucose, Bld: 115 mg/dL — ABNORMAL HIGH (ref 70–99)
Sodium: 131 mEq/L — ABNORMAL LOW (ref 135–145)

## 2011-04-28 LAB — CBC
HCT: 23.8 % — ABNORMAL LOW (ref 36.0–46.0)
Hemoglobin: 7.9 g/dL — ABNORMAL LOW (ref 12.0–15.0)
MCH: 31.1 pg (ref 26.0–34.0)
MCHC: 33.2 g/dL (ref 30.0–36.0)
MCV: 93.7 fL (ref 78.0–100.0)
RBC: 2.54 MIL/uL — ABNORMAL LOW (ref 3.87–5.11)

## 2011-04-28 LAB — SURGICAL PCR SCREEN
MRSA, PCR: NEGATIVE
Staphylococcus aureus: NEGATIVE

## 2011-04-28 LAB — GLUCOSE, CAPILLARY: Glucose-Capillary: 120 mg/dL — ABNORMAL HIGH (ref 70–99)

## 2011-04-28 NOTE — Progress Notes (Addendum)
Provided patient with information related to upcoming procedure, Cystoscopy with R urethral stent placement per Dr. Isabel Caprice. Pateint is to NPO after midnight, patient aware and procedure scheduled in the am. Patient verbalized understanding and no questions at this point. Patient did not want to order lunch tray at this time, gave patient 1 container of applesauce and covered CBG of 137 with 2 units. Patient lying in bed, appears comfortable with sister at the bedside. Kathryn [name], [facility]/Arlinda Williams,RN.

## 2011-04-28 NOTE — Anesthesia Preprocedure Evaluation (Addendum)
Anesthesia Evaluation  Patient identified by MRN, date of birth, ID band Patient awake  General Assessment Comment:Pt still taking Xarelto as of 2-20  Reviewed: Allergy & Precautions, H&P , NPO status , Patient's Chart, lab work & pertinent test results, reviewed documented beta blocker date and time   History of Anesthesia Complications (+) DIFFICULT IV STICK / SPECIAL LINE  Airway Mallampati: III TM Distance: >3 FB Neck ROM: Full    Dental  (+) Caps and Dental Advisory Given,    Pulmonary neg pulmonary ROS,  clear to auscultation        Cardiovascular hypertension, Pt. on medications + dysrhythmias Atrial Fibrillation Irregular Normal    Neuro/Psych Negative Neurological ROS  Negative Psych ROS   GI/Hepatic negative GI ROS, Neg liver ROS,   Endo/Other  Diabetes mellitus-, Poorly Controlled, Type 2, Insulin DependentMorbid obesitySSI peri-op  Renal/GU ?hydronephrosis/hematuria Cr 1.92  Genitourinary negative   Musculoskeletal negative musculoskeletal ROS (+)   Abdominal   Peds negative pediatric ROS (+)  Hematology Anemia Hgb 7.9   Anesthesia Other Findings   Reproductive/Obstetrics negative OB ROS                         Anesthesia Physical Anesthesia Plan  ASA: III  Anesthesia Plan: General   Post-op Pain Management:    Induction: Intravenous  Airway Management Planned: LMA  Additional Equipment:   Intra-op Plan:   Post-operative Plan: Extubation in OR  Informed Consent:   Dental advisory given  Plan Discussed with: CRNA and Surgeon  Anesthesia Plan Comments:         Anesthesia Quick Evaluation

## 2011-04-28 NOTE — H&P (Signed)
PCP:  Julian Hy, MD, MD  Chief Complaint:  Diffuse weakness, fevers and chills with maximum temperature 103.1 treated with Tylenol,  HPI:  This is a 78 year old Caucasian female, morbidly obese with a past medical history of insulin-dependent diabetes, hypertension, atrial fibrillation on anticoagulation, on strength right is, morbidly obese with associated gait instability and or carbonate 4 walker with significant assistance, currently room siting at skilled nursing facility. Patient was admitted for probably one week in December with significant urinary tract infection with cultures growing out Klebsiella, treated with Rocephin.  She now presents from that same skilled nursing facility with a one-day history of fevers and chills 103.82F associated with general nonfocal weakness, poor appetite, and one episode of nausea but no vomiting. In the emergency room, patient's physical exam, and initial workup was significant for urinary tract infection, question pyelonephritis.  Review of Systems:  Patient is alert and oriented x3, appropriate and states that she has had the fevers and chills but denies any new visual changes, swallowing difficulties, headaches, new skin rashes, but does admit to chronic issues with decubital ulcers, denies any worsening shortness of breath, chest pain, palpitations, significant change in bowel habits, blood in stool, has chronic issues with gross hematuria that is episodic, patient is incontinent, and wears adult diapers on a regular basis. She does question some dysuria with the last 24 hours, denies any new focal neurological deficits, specifically weakness or numbness in one layer one arm that is new. She admits to a cough approximately one month ago orally several weeks ago but now that is resolved.  Past Medical History:  Past Medical History   Diagnosis  Date   .  Osteoporosis    .  DM (diabetes mellitus)    .  HTN (hypertension)      unspecified   .   Atrial fibrillation    .  Macular degeneration    .  Osteoarthritis    .  Edema    .  Anemia    .  Hydronephrosis      left   .  UTI (lower urinary tract infection)     Past Surgical History   Procedure  Date   .  Replacement total knee  08/2007     bilateral   .  Appendectomy    .  Cholecystectomy    .  Colonoscopy    .  Upper gastrointestinal endoscopy     Medications:  Prior to Admission medications   Medication  Sig  Start Date  End Date  Taking?  Authorizing Provider   acetaminophen (TYLENOL) 325 MG tablet  Take 650 mg by mouth every 6 (six) hours as needed. pain    Yes  Historical Provider, MD   atorvastatin (LIPITOR) 20 MG tablet  Take 20 mg by mouth daily.    Yes  Historical Provider, MD   Calcium Carbonate-Vitamin D 600-400 MG-UNIT per tablet  Take 1 tablet by mouth 2 (two) times daily.    Yes  Historical Provider, MD   ferrous sulfate 325 (65 FE) MG tablet  Take 325 mg by mouth daily with breakfast.    Yes  Historical Provider, MD   HYDROcodone-acetaminophen (NORCO) 5-325 MG per tablet  Take 1 tablet by mouth every 6 (six) hours as needed.    Yes  Historical Provider, MD   insulin aspart (NOVOLOG) 100 UNIT/ML injection  Inject 5 Units into the skin 3 (three) times daily with meals. 5 units tid if cbg > 200  02/24/11  02/24/12  Yes  Julian Hy, MD   insulin glargine (LANTUS) 100 UNIT/ML injection  Inject 30 Units into the skin daily.  02/24/11  02/24/12  Yes  Julian Hy, MD   losartan (COZAAR) 100 MG tablet  Take 100 mg by mouth daily.    Yes  Historical Provider, MD   metoprolol tartrate (LOPRESSOR) 25 MG tablet  Take 25 mg by mouth 2 (two) times daily.    Yes  Historical Provider, MD   multivitamin (RENA-VIT) TABS tablet  Take 1 tablet by mouth daily.    Yes  Historical Provider, MD   multivitamin [facility]) per tablet  Take 1 tablet by mouth daily.    Yes  Historical Provider, MD   pantoprazole (PROTONIX) 40 MG tablet  Take 40 mg by mouth daily.    Yes   Historical Provider, MD   polyethylene glycol (MIRALAX / GLYCOLAX) packet  Take 17 g by mouth daily.    Yes  Historical Provider, MD   Rivaroxaban (XARELTO) 15 MG TABS tablet  Take 1 tablet (15 mg total) by mouth daily.  02/24/11   Yes  Julian Hy, MD   sennosides-docusate sodium (SENOKOT-S) 8.6-50 MG tablet  Take 2 tablets by mouth at bedtime.    Yes  Historical Provider, MD   solifenacin (VESICARE) 5 MG tablet  Take 10 mg by mouth daily.    Yes  Historical Provider, MD   zolpidem (AMBIEN) 5 MG tablet  Take 5 mg by mouth at bedtime as needed. For sleep.    Yes  Historical Provider, MD   Allergies:  Allergies   Allergen  Reactions   .  Celebrex (Celecoxib)    .  Nsaids    .  Sulfa Antibiotics     Social History:  reports that she has never smoked. She has never used smokeless tobacco. She reports that she does not drink alcohol or use illicit drugs. resides at [facility] place with husband  Family History:  Family History   Problem  Relation  Age of Onset   .  Diabetes  Father    .  Atrial fibrillation  Father    .  Breast cancer  Paternal Grandmother    .  Heart disease  Mother     Physical Exam:  Filed Vitals:    04/17/11 1819  04/17/11 1916  04/17/11 2008  04/17/11 2103   BP:  149/100   132/49  133/60   Pulse:  105   94  88   Temp:  101.7 F (38.7 C)  102.4 F (39.1 C)  101.5 F (38.6 C)    TempSrc:  Oral  Rectal  Oral    Resp:  20   17    Height:  5\' 9"  (1.753 m)      Weight:  99.791 kg (220 lb)      SpO2:  94%   97%  94%    Clinical exam  Patient morbidly obese, but alert and oriented x3, answering all questions appropriately, with continued fever as noted above  Sclera anicteric! Are intact pupils are equal and react and reactive to light, face is symmetric, there is no oropharyngeal lesions, tongue is midline, no facial asymmetries, no cervical lymphadenopathy  Lungs are clear to auscultation bilaterally with no rhonchi or wheezing or respiratory distress or  accessory muscle usage  Cardiovascular reveals a regular rate and rhythm with normal heart rate  Abdomen soft, nondistended, morbidly obese, subjectively diffusely tender after  placement of Foley catheter and removal, no rebound, no focal tenderness ascertained  Extremity exam reveals trace edema pedal pulses are intact, as externally his are warm with no cyanosis, no rash noted  Neurological exam cranial nerves II through XII grossly intact, patient alert and oriented x3, patient can move all 4 extremities, tone appears intact  Labs on Admission:   [facility]  04/17/11 1845   NA  133*   K  3.9   CL  98   CO2  24   GLUCOSE  178*   BUN  29*   CREATININE  1.45*   CALCIUM  9.6   MG  --   PHOS  --    No results found for this basename: AST:2,ALT:2,ALKPHOS:2,BILITOT:2,PROT:2,ALBUMIN:2 in the last 72 hours  No results found for this basename: LIPASE:2,AMYLASE:2 in the last 72 hours   Basename  04/17/11 1845   WBC  11.8*   NEUTROABS  10.9*   HGB  9.9*   HCT  28.9*   MCV  93.5   PLT  213    No results found for this basename: CKTOTAL:3,CKMB:3,CKMBINDEX:3,TROPONINI:3 in the last 72 hours  No results found for this basename: TSH,T4TOTAL,FREET3,T3FREE,THYROIDAB in the last 72 hours  No results found for this basename: VITAMINB12:2,FOLATE:2,FERRITIN:2,TIBC:2,IRON:2,RETICCTPCT:2 in the last 72 hours  Radiological Exams on Admission:  Dg Pneumonia Chest 2v  04/17/2011 *RADIOLOGY REPORT* Clinical Data: Fever. Rule out pneumonia CHEST - 2 VIEW Comparison: 02/17/2011 Findings: Negative for pneumonia. Mild atelectasis in the right base. Negative for heart failure or effusion. IMPRESSION: Mild right lower lobe atelectasis, negative for pneumonia. Original Report Authenticated By: Camelia Phenes, M.D.  Orders placed during the hospital encounter of 10/10/10   .  EKG   .  EKG   .  EKG    Assessment/Plan  Urinary tract infection with question pyelonephritis, blood cultures pending, urine culture  pending, history of recent Klebsiella infection, will be treated empirically with Rocephin, and with close followup on cultures, patient hemodynamically stable, will follow white blood cell count, slightly elevated compared with baseline approximately 7-8 secondary to urinary tract infection, chest x-ray unremarkable, nasal swab for influenza pending in the emergency room however it appears that given dysuria this is probably related to urinary tract infection. Patient denies cough, sore throat  Atrial fibrillation-, on appropriate anticoagulation for age and renal parameters, hemodynamically stable, in sinus rhythm by exam  Hypertension-controlled, will continue home medications  Diabetes appears controlled historically, will continue home medications  Anemia-hemoglobin 9.9 stable compared with previous records, patient does have a history of gross hematuria, followed by urology, with no need for further evaluation based on reports from hospitalization in December of 2012. Patient is also followed by hematology with her normocytic, normochromic anemia thought to be multifactorial due to iron deficiency as well as renal disease. She is currently on Aranesp therapy every 3 weeks  Disposition-pending management of fever, urinary tract infection, probable disposition back to [facility]  04/17/2011, 10:02 PM

## 2011-04-28 NOTE — Progress Notes (Signed)
Subjective: C/o persistent nausea dry heaves though less vs yesterday abd pain better   Antibiotics:  Anti-infectives     Start     Dose/Rate Route Frequency Ordered Stop   04/20/11 1400   cefTRIAXone (ROCEPHIN) 1 g in dextrose 5 % 50 mL IVPB        1 g 100 mL/hr over 30 Minutes Intravenous Every 24 hours 04/20/11 1337     04/19/11 1200   imipenem-cilastatin (PRIMAXIN) 250 mg in sodium chloride 0.9 % 100 mL IVPB  Status:  Discontinued        250 mg 200 mL/hr over 30 Minutes Intravenous 4 times per day 04/19/11 1045 04/20/11 1337   04/18/11 2200   cefTRIAXone (ROCEPHIN) 1 g in dextrose 5 % 50 mL IVPB  Status:  Discontinued        1 g 100 mL/hr over 30 Minutes Intravenous Daily at bedtime 04/18/11 0020 04/19/11 0913   04/18/11 0000   cefTRIAXone (ROCEPHIN) 1 g in dextrose 5 % 50 mL IVPB  Status:  Discontinued        1 g 100 mL/hr over 30 Minutes Intravenous Every 24 hours 04/17/11 2217 04/18/11 0020   04/17/11 2015   cefTRIAXone (ROCEPHIN) 1 g in dextrose 5 % 50 mL IVPB        1 g 100 mL/hr over 30 Minutes Intravenous  Once 04/17/11 2002 04/17/11 2307          Medications: Scheduled Meds:    . cefTRIAXone (ROCEPHIN)  IV  1 g Intravenous Q24H  . darifenacin  7.5 mg Oral Daily  . ferrous sulfate  325 mg Oral Q breakfast  . insulin aspart  0-15 Units Subcutaneous TID WC  . insulin aspart  5 Units Subcutaneous TID WC  . insulin glargine  30 Units Subcutaneous QHS  . metoprolol tartrate  25 mg Oral BID  . multivitamin  1 tablet Oral Daily  . pantoprazole  40 mg Oral Daily  . protein supplement  6 g Oral TID WC  . rosuvastatin  20 mg Oral Daily  . DISCONTD: rivaroxaban  15 mg Oral Daily   Continuous Infusions:    . sodium chloride 1,000 mL (04/28/11 0737)   PRN Meds:.acetaminophen, bisacodyl, hydrALAZINE, HYDROcodone-acetaminophen, magic mouthwash, menthol-cetylpyridinium, morphine, ondansetron (ZOFRAN) IV, ondansetron, zolpidem   Objective: Weight change:    Intake/Output Summary (Last 24 hours) at 04/28/11 1416 Last data filed at 04/28/11 1357  Gross per 24 hour  Intake 3817.33 ml  Output   2900 ml  Net 917.33 ml   Blood pressure 144/78, pulse 70, temperature 98.2 F (36.8 C), temperature source Oral, resp. rate 16, height 5\' 9"  (1.753 m), weight 233 lb 7.5 oz (105.9 kg), SpO2 99.00%. Temp:  [97.8 F (36.6 C)-99.4 F (37.4 C)] 98.2 F (36.8 C) (02/20 1356) Pulse Rate:  [70-83] 70  (02/20 1356) Resp:  [16] 16  (02/20 1356) BP: (140-151)/(77-83) 144/78 mmHg (02/20 0937) SpO2:  [95 %-99 %] 99 % (02/20 1356)  Physical Exam: General: Alert and awake, oriented x3, not in any acute distress. HEENT: anicteric sclera, pupils reactive to light and accommodation, EOMI CVS regular rate, normal r,  no murmur rubs or gallops Chest: clear to auscultation bilaterally, no wheezing, rales or rhonchi Abdomen: tender ruq rlq greater than rest of abdomen, pos bwoel sounds Extremities: no  clubbing or edema noted bilaterally Skin: no rashes Lymph: no new lymphadenopathy Neuro: nonfocal  Lab Results:  Basename 04/28/11 0332 04/27/11 0347  WBC 10.5 12.3*  HGB 7.9* 8.0*  HCT 23.8* 24.3*  PLT 341 317    BMET  Basename 04/28/11 0332 04/27/11 0347  NA 131* 129*  K 4.5 4.3  CL 106 102  CO2 18* 20  GLUCOSE 115* 135*  BUN 27* 27*  CREATININE 1.92* 2.06*  CALCIUM 8.1* 8.3*    Micro Results: Recent Results (from the past 240 hour(s))  CULTURE, BLOOD (ROUTINE X 2)     Status: Normal   Collection Time   04/19/11  9:50 AM      Component Value Range Status Comment   Specimen Description BLOOD LEFT HAND   Final    Special Requests BOTTLES DRAWN AEROBIC AND ANAEROBIC 10CC   Final    Culture  Setup Time [id number]   Final    Culture NO GROWTH 5 DAYS   Final    Report Status 04/25/2011 FINAL   Final   CULTURE, BLOOD (ROUTINE X 2)     Status: Normal   Collection Time   04/19/11 10:00 AM      Component Value Range Status Comment   Specimen  Description BLOOD LEFT ARM   Final    Special Requests BOTTLES DRAWN AEROBIC AND ANAEROBIC 10CC   Final    Culture  Setup Time [id number]   Final    Culture NO GROWTH 5 DAYS   Final    Report Status 04/25/2011 FINAL   Final   SURGICAL PCR SCREEN     Status: Normal   Collection Time   04/28/11 10:25 AM      Component Value Range Status Comment   MRSA, PCR NEGATIVE  NEGATIVE  Final    Staphylococcus aureus NEGATIVE  NEGATIVE  Final     Studies/Results: No results found.    Assessment/Plan: Kathryn [name] is a 78 y.o. female with  kleb pneumonia complicated UTI with bacteremia, now with ? Perforation of right ureter based on findings on CT scan. She had gas collection on prior CT scan that has since resolved and there was also consideration of pathology in her viscera given this finding --continue rocephin pending urology evaluation --followup results of her cystoscopy   LOS: 11 days   Acey Lav 04/28/2011, 2:16 PM

## 2011-04-28 NOTE — Progress Notes (Signed)
CSW met briefly with Pt. She was asking to rest. CSW will follow and assist with SNF when ready.  Kathryn [name] 04/28/2011 12:19 PM #[phone]

## 2011-04-28 NOTE — Progress Notes (Signed)
Patient ID: Kathryn [name], female   DOB: [birth year], 78 y.o.   MRN: [id number]   Subjective: Patient reports a feeling better today. Her appetite is improved. She denies any significant abdominal pain.  Objective: Vital signs in last 24 hours: Temp:  [97.8 F (36.6 C)-99.4 F (37.4 C)] 98.4 F (36.9 C) (02/20 0522) Pulse Rate:  [72-97] 72  (02/20 0522) Resp:  [16-18] 16  (02/20 0522) BP: (140-154)/(74-84) 140/83 mmHg (02/20 0522) SpO2:  [95 %-97 %] 96 % (02/20 0522)  Intake/Output from previous day: 02/19 0701 - 02/20 0700 In: 3376.1 [P.O.:540; I.V.:2836.1] Out: 2500 [Urine:2500] Intake/Output this shift:    Physical Exam:  Constitutional: Vital signs reviewed. WD WN in NAD   Eyes: PERRL, No scleral icterus.   Cardiovascular: RRR Pulmonary/Chest: Normal effort Abdominal: Soft. Non-tender, non-distended, bowel sounds are normal, no masses, organomegaly, or guarding present.  Extremities: No cyanosis or edema   Lab Results:  Basename 04/28/11 0332 04/27/11 0347 04/26/11 0328  HGB 7.9* 8.0* 8.1*  HCT 23.8* 24.3* 24.3*   BMET  Basename 04/28/11 0332 04/27/11 0347  NA 131* 129*  K 4.5 4.3  CL 106 102  CO2 18* 20  GLUCOSE 115* 135*  BUN 27* 27*  CREATININE 1.92* 2.06*  CALCIUM 8.1* 8.3*   No results found for this basename: LABPT:3,INR:3 in the last 72 hours No results found for this basename: LABURIN:1 in the last 72 hours Results for orders placed during the hospital encounter of 04/17/11  CULTURE, BLOOD (ROUTINE X 2)     Status: Normal   Collection Time   04/17/11  6:45 PM      Component Value Range Status Comment   Specimen Description BLOOD RIGHT HAND   Final    Special Requests BOTTLES DRAWN AEROBIC AND ANAEROBIC 5 CC EACH   Final    Culture  Setup Time [id number]   Final    Culture     Final    Value: KLEBSIELLA PNEUMONIAE     Note: Gram Stain Report Called to,Read Back By and Verified With: [facility] MILLER 04/22/11 1535 BY SMITHERSJ   Report Status  04/24/2011 FINAL   Final    Organism ID, Bacteria KLEBSIELLA PNEUMONIAE   Final   URINE CULTURE     Status: Normal   Collection Time   04/17/11  7:31 PM      Component Value Range Status Comment   Specimen Description URINE, CATHETERIZED   Final    Special Requests NONE   Final    Culture  Setup Time [id number]   Final    Colony Count 85,000 COLONIES/ML   Final    Culture KLEBSIELLA PNEUMONIAE   Final    Report Status 04/19/2011 FINAL   Final    Organism ID, Bacteria KLEBSIELLA PNEUMONIAE   Final   CULTURE, BLOOD (ROUTINE X 2)     Status: Normal   Collection Time   04/17/11  9:50 PM      Component Value Range Status Comment   Specimen Description BLOOD LEFT ARM   Final    Special Requests BOTTLES DRAWN AEROBIC AND ANAEROBIC 5CC   Final    Culture  Setup Time [id number]   Final    Culture     Final    Value: KLEBSIELLA PNEUMONIAE     Note: CRITICAL RESULT CALLED TO, READ BACK BY AND VERIFIED WITH: Bretta Bang @ 840PM BY VINCJ 04/18/11   Report Status 04/20/2011 FINAL   Final    Organism ID, Bacteria  KLEBSIELLA PNEUMONIAE   Final   MRSA PCR SCREENING     Status: Normal   Collection Time   04/18/11 12:17 AM      Component Value Range Status Comment   MRSA by PCR NEGATIVE  NEGATIVE  Final   CULTURE, BLOOD (ROUTINE X 2)     Status: Normal   Collection Time   04/19/11  9:50 AM      Component Value Range Status Comment   Specimen Description BLOOD LEFT HAND   Final    Special Requests BOTTLES DRAWN AEROBIC AND ANAEROBIC 10CC   Final    Culture  Setup Time [id number]   Final    Culture NO GROWTH 5 DAYS   Final    Report Status 04/25/2011 FINAL   Final   CULTURE, BLOOD (ROUTINE X 2)     Status: Normal   Collection Time   04/19/11 10:00 AM      Component Value Range Status Comment   Specimen Description BLOOD LEFT ARM   Final    Special Requests BOTTLES DRAWN AEROBIC AND ANAEROBIC 10CC   Final    Culture  Setup Time [id number]   Final    Culture NO GROWTH 5 DAYS   Final     Report Status 04/25/2011 FINAL   Final     Studies/Results: No results found.  Assessment/Plan:   Ms. Barbian is doing better clinically with no significant abdominal complaints at this time. Her white blood cell count has again normalized. She continues however to have renal insufficiency that is not improving. Again I do not suspect ureteral injury or perforation which would be again extremely rare in the face of no recent instrumentation or trauma. In addition any injury to the ureter would typically results a large fluid collection urinoma and not a scant amount of air only in the retroperitoneum. On the other hand she has had intermittent bilateral hydronephrosis and the etiology has not been clear. I plan on taking her to surgery tomorrow. We will perform a careful cystoscopic assessment of her bladder along with bilateral retrograde pyelograms to assess the ureters or filling defects as well as obstruction. Double-J stent will be placed if indicated. This was discussed at length with Ms. Zweig this morning.   LOS: 11 days   Bartt Gonzaga S 04/28/2011, 8:37 AM

## 2011-04-29 ENCOUNTER — Other Ambulatory Visit: Payer: Self-pay | Admitting: Urology

## 2011-04-29 ENCOUNTER — Inpatient Hospital Stay (HOSPITAL_COMMUNITY): Payer: Medicare Other | Admitting: Anesthesiology

## 2011-04-29 ENCOUNTER — Encounter (HOSPITAL_COMMUNITY)
Admission: EM | Disposition: A | Discharge status: Skilled Nursing Facility | Payer: Self-pay | Source: Home / Self Care | Attending: Endocrinology

## 2011-04-29 ENCOUNTER — Encounter (HOSPITAL_COMMUNITY): Payer: Self-pay | Admitting: Anesthesiology

## 2011-04-29 HISTORY — PX: URETEROSCOPY: SHX842

## 2011-04-29 HISTORY — PX: CYSTOSCOPY W/ URETERAL STENT PLACEMENT: SHX1429

## 2011-04-29 LAB — GLUCOSE, CAPILLARY: Glucose-Capillary: 82 mg/dL (ref 70–99)

## 2011-04-29 LAB — COMPREHENSIVE METABOLIC PANEL
AST: 26 U/L (ref 0–37)
Albumin: 1.4 g/dL — ABNORMAL LOW (ref 3.5–5.2)
Alkaline Phosphatase: 333 U/L — ABNORMAL HIGH (ref 39–117)
Chloride: 106 mEq/L (ref 96–112)
Potassium: 4.5 mEq/L (ref 3.5–5.1)
Total Bilirubin: 0.2 mg/dL — ABNORMAL LOW (ref 0.3–1.2)

## 2011-04-29 LAB — CBC
HCT: 23.1 % — ABNORMAL LOW (ref 36.0–46.0)
Platelets: 307 10*3/uL (ref 150–400)
RBC: 2.44 MIL/uL — ABNORMAL LOW (ref 3.87–5.11)
RDW: 16.5 % — ABNORMAL HIGH (ref 11.5–15.5)
WBC: 8.6 10*3/uL (ref 4.0–10.5)

## 2011-04-29 SURGERY — CYSTOSCOPY, WITH RETROGRADE PYELOGRAM AND URETERAL STENT INSERTION
Anesthesia: General | Site: Ureter | Laterality: Right | Wound class: Clean Contaminated

## 2011-04-29 MED ORDER — LACTATED RINGERS IV SOLN: INTRAVENOUS | Status: DC

## 2011-04-29 MED ORDER — FENTANYL CITRATE 0.05 MG/ML IJ SOLN
INTRAMUSCULAR | Status: DC | PRN
  Administered 2011-04-29: 100 ug via INTRAVENOUS

## 2011-04-29 MED ORDER — DIATRIZOATE MEGLUMINE 30 % UR SOLN
URETHRAL | Status: DC | PRN
  Administered 2011-04-29: 300 mL via URETHRAL

## 2011-04-29 MED ORDER — FENTANYL CITRATE 0.05 MG/ML IJ SOLN: 25.0000 ug | INTRAMUSCULAR | Status: DC | PRN

## 2011-04-29 MED ORDER — INDIGOTINDISULFONATE SODIUM 8 MG/ML IJ SOLN
INTRAMUSCULAR | Status: DC | PRN
  Administered 2011-04-29: 5 mL via INTRAVENOUS

## 2011-04-29 MED ORDER — 0.9 % SODIUM CHLORIDE (POUR BTL) OPTIME
TOPICAL | Status: DC | PRN
  Administered 2011-04-29: 1000 mL

## 2011-04-29 MED ORDER — IOHEXOL 300 MG/ML  SOLN
INTRAMUSCULAR | Status: DC | PRN
  Administered 2011-04-29: 44 mL

## 2011-04-29 MED ORDER — STERILE WATER FOR IRRIGATION IR SOLN
Status: DC | PRN
  Administered 2011-04-29: 3000 mL

## 2011-04-29 MED ORDER — LACTATED RINGERS IV SOLN
INTRAVENOUS | Status: DC | PRN
  Administered 2011-04-29: 12:00:00 via INTRAVENOUS

## 2011-04-29 MED ORDER — DEXTROSE 50 % IV SOLN
25.0000 mL | Freq: Once | INTRAVENOUS | Status: AC | PRN
  Administered 2011-04-29: 14:00:00 via INTRAVENOUS

## 2011-04-29 MED ORDER — ONDANSETRON HCL 4 MG/2ML IJ SOLN
INTRAMUSCULAR | Status: DC | PRN
  Administered 2011-04-29: 4 mg via INTRAVENOUS

## 2011-04-29 MED ORDER — PROPOFOL 10 MG/ML IV BOLUS
INTRAVENOUS | Status: DC | PRN
  Administered 2011-04-29: 150 mg via INTRAVENOUS

## 2011-04-29 MED ORDER — PROMETHAZINE HCL 25 MG/ML IJ SOLN: 6.2500 mg | INTRAMUSCULAR | Status: DC | PRN

## 2011-04-29 SURGICAL SUPPLY — 24 items
ADAPTER CATH URET PLST 4-6FR (CATHETERS) ×3 IMPLANT
ADPR CATH URET STRL DISP 4-6FR (CATHETERS) ×2
BAG URO CATCHER STRL LF (DRAPE) ×3 IMPLANT
CATH FOLEY 2WAY SLVR  5CC 18FR (CATHETERS) ×1
CATH FOLEY 2WAY SLVR 5CC 18FR (CATHETERS) IMPLANT
CATH INTERMIT  6FR 70CM (CATHETERS) ×1 IMPLANT
CATH URET 5FR 28IN CONE TIP (BALLOONS)
CATH URET 5FR 70CM CONE TIP (BALLOONS) IMPLANT
CATH URET WHISTLE 5FR 28IN (CATHETERS) IMPLANT
CATH URET WHISTLE 6FR (CATHETERS) IMPLANT
CLOTH BEACON ORANGE TIMEOUT ST (SAFETY) ×3 IMPLANT
CONT SPEC 4OZ CLIKSEAL STRL BL (MISCELLANEOUS) ×1 IMPLANT
DRAPE CAMERA CLOSED 9X96 (DRAPES) ×1 IMPLANT
GLOVE BIOGEL M STRL SZ7.5 (GLOVE) ×3 IMPLANT
GOWN PREVENTION PLUS XLARGE (GOWN DISPOSABLE) ×3 IMPLANT
GOWN STRL NON-REIN LRG LVL3 (GOWN DISPOSABLE) ×2 IMPLANT
GOWN STRL REIN XL XLG (GOWN DISPOSABLE) ×3 IMPLANT
GUIDEWIRE STR DUAL SENSOR (WIRE) ×2 IMPLANT
MANIFOLD NEPTUNE II (INSTRUMENTS) ×3 IMPLANT
PACK CYSTO (CUSTOM PROCEDURE TRAY) ×3 IMPLANT
STENT CONTOUR 8FR X 24 (STENTS) ×1 IMPLANT
SYRINGE IRR TOOMEY STRL 70CC (SYRINGE) ×1 IMPLANT
TUBING CONNECTING 10 (TUBING) ×3 IMPLANT
WIRE COONS/BENSON .038X145CM (WIRE) ×2 IMPLANT

## 2011-04-29 NOTE — OR Nursing (Signed)
Late Entry 1416 upon arrival to pacu from OR==CBG: 59  Treatment: D50 IV 25 mL  Symptoms: None  Follow-up CBG: Time:1451 CBG Result:91  Possible Reasons for Event: Unknown  Comments/MD notified:Dr Ewell    Frazier Richards, Rae Roam

## 2011-04-29 NOTE — Progress Notes (Signed)
Pt was found to be alert and oriented x 4 this morning. Foley catheter is intact draining amber colored urine with sediment. Dressing on sacrum is intact. Before surgery pt was prepared with a Chlorohexidine bath. Pt sent to surgery for cystoscopy with retrograde pyelogram/ureteral stent placement. Pt/family aware of procedure; consent for procedure to chart. Vital signs prior to surgery temperature 98.1, HR 81, respirations 17, BP 140/71, SpO2 99%. Pt had no questions about surgery. Pt was taught how to use incentive spirometer with return demonstration. Lacey Jensen, Student Nurse, [facility].

## 2011-04-29 NOTE — Progress Notes (Signed)
Subjective: LESS, pain, still with abdominal pain   Antibiotics:  Anti-infectives     Start     Dose/Rate Route Frequency Ordered Stop   04/20/11 1400   cefTRIAXone (ROCEPHIN) 1 g in dextrose 5 % 50 mL IVPB        1 g 100 mL/hr over 30 Minutes Intravenous Every 24 hours 04/20/11 1337     04/19/11 1200   imipenem-cilastatin (PRIMAXIN) 250 mg in sodium chloride 0.9 % 100 mL IVPB  Status:  Discontinued        250 mg 200 mL/hr over 30 Minutes Intravenous 4 times per day 04/19/11 1045 04/20/11 1337   04/18/11 2200   cefTRIAXone (ROCEPHIN) 1 g in dextrose 5 % 50 mL IVPB  Status:  Discontinued        1 g 100 mL/hr over 30 Minutes Intravenous Daily at bedtime 04/18/11 0020 04/19/11 0913   04/18/11 0000   cefTRIAXone (ROCEPHIN) 1 g in dextrose 5 % 50 mL IVPB  Status:  Discontinued        1 g 100 mL/hr over 30 Minutes Intravenous Every 24 hours 04/17/11 2217 04/18/11 0020   04/17/11 2015   cefTRIAXone (ROCEPHIN) 1 g in dextrose 5 % 50 mL IVPB        1 g 100 mL/hr over 30 Minutes Intravenous  Once 04/17/11 2002 04/17/11 2307          Medications: Scheduled Meds:    . cefTRIAXone (ROCEPHIN)  IV  1 g Intravenous Q24H  . darifenacin  7.5 mg Oral Daily  . ferrous sulfate  325 mg Oral Q breakfast  . insulin aspart  0-15 Units Subcutaneous TID WC  . insulin aspart  5 Units Subcutaneous TID WC  . insulin glargine  30 Units Subcutaneous QHS  . metoprolol tartrate  25 mg Oral BID  . multivitamin  1 tablet Oral Daily  . pantoprazole  40 mg Oral Daily  . protein supplement  6 g Oral TID WC  . rosuvastatin  20 mg Oral Daily   Continuous Infusions:    . sodium chloride 1,000 mL (04/29/11 0852)   PRN Meds:.acetaminophen, bisacodyl, hydrALAZINE, HYDROcodone-acetaminophen, magic mouthwash, menthol-cetylpyridinium, morphine, ondansetron (ZOFRAN) IV, ondansetron, zolpidem   Objective: Weight change:   Intake/Output Summary (Last 24 hours) at 04/29/11 1157 Last data filed at 04/29/11  0930  Gross per 24 hour  Intake   3025 ml  Output   3425 ml  Net   -400 ml   Blood pressure 140/71, pulse 81, temperature 98.1 F (36.7 C), temperature source Oral, resp. rate 17, height 5\' 9"  (1.753 m), weight 233 lb 7.5 oz (105.9 kg), SpO2 99.00%. Temp:  [98.1 F (36.7 C)-98.7 F (37.1 C)] 98.1 F (36.7 C) (02/21 1040) Pulse Rate:  [70-87] 81  (02/21 1040) Resp:  [16-18] 17  (02/21 1040) BP: (140-158)/(71-76) 140/71 mmHg (02/21 1040) SpO2:  [97 %-99 %] 99 % (02/21 1040)  Physical Exam: General: Alert and awake, oriented x3, not in any acute distress. HEENT: anicteric sclera, pupils reactive to light and accommodation, EOMI CVS regular rate, normal r,  no murmur rubs or gallops Chest: clear to auscultation bilaterally, no wheezing, rales or rhonchi Abdomen: tender ruq rlq greater than rest of abdomen, pos bwoel sounds Extremities: no  clubbing or edema noted bilaterally Skin: no rashes Lymph: no new lymphadenopathy Neuro: nonfocal  Lab Results:  Basename 04/29/11 0500 04/28/11 0332  WBC 8.6 10.5  HGB 7.7* 7.9*  HCT 23.1* 23.8*  PLT 307 341  BMET  Basename 04/29/11 0500 04/28/11 0332  NA 131* 131*  K 4.5 4.5  CL 106 106  CO2 19 18*  GLUCOSE 109* 115*  BUN 27* 27*  CREATININE 1.83* 1.92*  CALCIUM 8.4 8.1*    Micro Results: Recent Results (from the past 240 hour(s))  SURGICAL PCR SCREEN     Status: Normal   Collection Time   04/28/11 10:25 AM      Component Value Range Status Comment   MRSA, PCR NEGATIVE  NEGATIVE  Final    Staphylococcus aureus NEGATIVE  NEGATIVE  Final     Studies/Results: No results found.    Assessment/Plan: Kathryn [name] is a 78 y.o. female with  kleb pneumonia complicated UTI with bacteremia, sp day 13 days of effeective antibiotics, now with ? Perforation of right ureter based on findings on CT scan. She had gas collection on prior CT scan that has since resolved and there was also consideration of pathology in her viscera  given this finding --continue rocephin pending urology evaluation --followup results of her cystoscopy   LOS: 12 days   Acey Lav 04/29/2011, 11:57 AM

## 2011-04-29 NOTE — Progress Notes (Signed)
Patient ID: Kathryn [name], female   DOB: 1933/09/09, 78 y.o.   MRN: [id number] Late entry Pt seen 02/20 at 7:10 AM.  Had a fair night. Some nausea with eating. Stll no word on urology procedure. Less abd pain

## 2011-04-29 NOTE — Anesthesia Postprocedure Evaluation (Signed)
  Anesthesia Post-op Note  Patient: Kathryn [name]  Procedure(s) Performed: Procedure(s) (LRB): CYSTOSCOPY WITH RETROGRADE PYELOGRAM/URETERAL STENT PLACEMENT (Bilateral) URETEROSCOPY (Right)  Patient Location: PACU  Anesthesia Type: General  Level of Consciousness: awake and alert   Airway and Oxygen Therapy: Patient Spontanous Breathing  Post-op Pain: mild  Post-op Assessment: Post-op Vital signs reviewed, Patient's Cardiovascular Status Stable, Respiratory Function Stable, Patent Airway and No signs of Nausea or vomiting  Post-op Vital Signs: stable  Complications: No apparent anesthesia complications

## 2011-04-29 NOTE — H&P (View-Only) (Signed)
Patient ID: Kathryn [name], female   DOB: 08/14/1933, 77 y.o.   MRN: [id number]   Subjective: Patient reports a feeling better today. Her appetite is improved. She denies any significant abdominal pain.  Objective: Vital signs in last 24 hours: Temp:  [97.8 F (36.6 C)-99.4 F (37.4 C)] 98.4 F (36.9 C) (02/20 0522) Pulse Rate:  [72-97] 72  (02/20 0522) Resp:  [16-18] 16  (02/20 0522) BP: (140-154)/(74-84) 140/83 mmHg (02/20 0522) SpO2:  [95 %-97 %] 96 % (02/20 0522)  Intake/Output from previous day: 02/19 0701 - 02/20 0700 In: 3376.1 [P.O.:540; I.V.:2836.1] Out: 2500 [Urine:2500] Intake/Output this shift:    Physical Exam:  Constitutional: Vital signs reviewed. WD WN in NAD   Eyes: PERRL, No scleral icterus.   Cardiovascular: RRR Pulmonary/Chest: Normal effort Abdominal: Soft. Non-tender, non-distended, bowel sounds are normal, no masses, organomegaly, or guarding present.  Extremities: No cyanosis or edema   Lab Results:  Basename 04/28/11 0332 04/27/11 0347 04/26/11 0328  HGB 7.9* 8.0* 8.1*  HCT 23.8* 24.3* 24.3*   BMET  Basename 04/28/11 0332 04/27/11 0347  NA 131* 129*  K 4.5 4.3  CL 106 102  CO2 18* 20  GLUCOSE 115* 135*  BUN 27* 27*  CREATININE 1.92* 2.06*  CALCIUM 8.1* 8.3*   No results found for this basename: LABPT:3,INR:3 in the last 72 hours No results found for this basename: LABURIN:1 in the last 72 hours Results for orders placed during the hospital encounter of 04/17/11  CULTURE, BLOOD (ROUTINE X 2)     Status: Normal   Collection Time   04/17/11  6:45 PM      Component Value Range Status Comment   Specimen Description BLOOD RIGHT HAND   Final    Special Requests BOTTLES DRAWN AEROBIC AND ANAEROBIC 5 CC EACH   Final    Culture  Setup Time [id number]   Final    Culture     Final    Value: KLEBSIELLA PNEUMONIAE     Note: Gram Stain Report Called to,Read Back By and Verified With: KENNY MILLER 04/22/11 1535 BY SMITHERSJ   Report Status  04/24/2011 FINAL   Final    Organism ID, Bacteria KLEBSIELLA PNEUMONIAE   Final   URINE CULTURE     Status: Normal   Collection Time   04/17/11  7:31 PM      Component Value Range Status Comment   Specimen Description URINE, CATHETERIZED   Final    Special Requests NONE   Final    Culture  Setup Time [id number]   Final    Colony Count 85,000 COLONIES/ML   Final    Culture KLEBSIELLA PNEUMONIAE   Final    Report Status 04/19/2011 FINAL   Final    Organism ID, Bacteria KLEBSIELLA PNEUMONIAE   Final   CULTURE, BLOOD (ROUTINE X 2)     Status: Normal   Collection Time   04/17/11  9:50 PM      Component Value Range Status Comment   Specimen Description BLOOD LEFT ARM   Final    Special Requests BOTTLES DRAWN AEROBIC AND ANAEROBIC 5CC   Final    Culture  Setup Time [id number]   Final    Culture     Final    Value: KLEBSIELLA PNEUMONIAE     Note: CRITICAL RESULT CALLED TO, READ BACK BY AND VERIFIED WITH: DEBRA ANDERSON @ 840PM BY VINCJ 04/18/11   Report Status 04/20/2011 FINAL   Final    Organism ID, Bacteria   KLEBSIELLA PNEUMONIAE   Final   MRSA PCR SCREENING     Status: Normal   Collection Time   04/18/11 12:17 AM      Component Value Range Status Comment   MRSA by PCR NEGATIVE  NEGATIVE  Final   CULTURE, BLOOD (ROUTINE X 2)     Status: Normal   Collection Time   04/19/11  9:50 AM      Component Value Range Status Comment   Specimen Description BLOOD LEFT HAND   Final    Special Requests BOTTLES DRAWN AEROBIC AND ANAEROBIC 10CC   Final    Culture  Setup Time [id number]   Final    Culture NO GROWTH 5 DAYS   Final    Report Status 04/25/2011 FINAL   Final   CULTURE, BLOOD (ROUTINE X 2)     Status: Normal   Collection Time   04/19/11 10:00 AM      Component Value Range Status Comment   Specimen Description BLOOD LEFT ARM   Final    Special Requests BOTTLES DRAWN AEROBIC AND ANAEROBIC 10CC   Final    Culture  Setup Time [id number]   Final    Culture NO GROWTH 5 DAYS   Final     Report Status 04/25/2011 FINAL   Final     Studies/Results: No results found.  Assessment/Plan:   Kathryn [name] is doing better clinically with no significant abdominal complaints at this time. Her white blood cell count has again normalized. She continues however to have renal insufficiency that is not improving. Again I do not suspect ureteral injury or perforation which would be again extremely rare in the face of no recent instrumentation or trauma. In addition any injury to the ureter would typically results a large fluid collection urinoma and not a scant amount of air only in the retroperitoneum. On the other hand she has had intermittent bilateral hydronephrosis and the etiology has not been clear. I plan on taking her to surgery tomorrow. We will perform a careful cystoscopic assessment of her bladder along with bilateral retrograde pyelograms to assess the ureters or filling defects as well as obstruction. Double-J stent will be placed if indicated. This was discussed at length with Kathryn [name] this morning.   LOS: 11 days   Luverne Zerkle S 04/28/2011, 8:37 AM        

## 2011-04-29 NOTE — Preoperative (Addendum)
Beta Blockers   Reason not to administer Beta Blockers:Not Applicable cancelled by hospitalist 04/28/11

## 2011-04-29 NOTE — Progress Notes (Signed)
Subjective: Doing well. NPO for the urologic procedure later this AM   Objective: Vital signs in last 24 hours: Temp:  [97.8 F (36.6 C)-98.7 F (37.1 C)] 98.2 F (36.8 C) (02/21 0549) Pulse Rate:  [70-87] 72  (02/21 0549) Resp:  [16-18] 18  (02/21 0549) BP: (144-158)/(76-78) 158/76 mmHg (02/21 0549) SpO2:  [96 %-99 %] 97 % (02/21 0549)  Intake/Output from previous day: 02/20 0701 - 02/21 0700 In: 3385 [P.O.:360; I.V.:3025] Out: 2850 [Urine:2850] Intake/Output this shift:    General: no distressnon toxic, anicteric, oral membranes moist. Lungs clear. Ht IR/IR with murmur. Abdomen obese soft sl tender but no rebound. Hypoactive BS. Alert awake, mentating well, no tremor   Lab Results   Basename 04/29/11 0500 04/28/11 0332  WBC 8.6 10.5  RBC 2.44* 2.54*  HGB 7.7* 7.9*  HCT 23.1* 23.8*  MCV 94.7 93.7  MCH 31.6 31.1  RDW 16.5* 16.5*  PLT 307 341    Basename 04/29/11 0500 04/28/11 0332  NA 131* 131*  K 4.5 4.5  CL 106 106  CO2 19 18*  GLUCOSE 109* 115*  BUN 27* 27*  CREATININE 1.83* 1.92*  CALCIUM 8.4 8.1*    Studies/Results: No results found.  Scheduled Meds:   . cefTRIAXone (ROCEPHIN)  IV  1 g Intravenous Q24H  . darifenacin  7.5 mg Oral Daily  . ferrous sulfate  325 mg Oral Q breakfast  . insulin aspart  0-15 Units Subcutaneous TID WC  . insulin aspart  5 Units Subcutaneous TID WC  . insulin glargine  30 Units Subcutaneous QHS  . metoprolol tartrate  25 mg Oral BID  . multivitamin  1 tablet Oral Daily  . pantoprazole  40 mg Oral Daily  . protein supplement  6 g Oral TID WC  . rosuvastatin  20 mg Oral Daily  . DISCONTD: rivaroxaban  15 mg Oral Daily   Continuous Infusions:   . sodium chloride 125 mL/hr at 04/28/11 1639   PRN Meds:acetaminophen, bisacodyl, hydrALAZINE, HYDROcodone-acetaminophen, magic mouthwash, menthol-cetylpyridinium, morphine, ondansetron (ZOFRAN) IV, ondansetron, zolpidem  Assessment/Plan:  PYELONEPHRITIS/SEPSIS FROM A URINARY  SOURCE: Klebsiella in blood and urine. Now on rocephin. No fever. Continue IV Abx for now. Needs 14 days overall . CT report questions a perforation of the ureter. Collecting system continues to be dilated and is slowly progressive. Procedure planned  ABDOMINAL PAIN/VOMITING : Sl better. Genl Surg help appreciated  RENAL INSUFFICIENCY: sl better HYDROURETER: See above  ATRIAL FIBRILLATION: Stable rate  DM 2: FBS better at 109  HYPERTENSION: BP reasonable overall  ANEMIA: A lttle worse at 7.7 HYPONATREMIA: ? Due to nausea. Sl better at 131 CONSTIPATION:making progress  DISP: Will be going back to SNF   LOS: 12 days   Nyshaun Standage [name] 04/29/2011, 8:43 AM

## 2011-04-29 NOTE — Transfer of Care (Signed)
Immediate Anesthesia Transfer of Care Note  Patient: Kathryn [name]  Procedure(s) Performed: Procedure(s) (LRB): CYSTOSCOPY WITH RETROGRADE PYELOGRAM/URETERAL STENT PLACEMENT (Bilateral) URETEROSCOPY (Right)  Patient Location: PACU  Anesthesia Type: General  Level of Consciousness: awake, alert  and patient cooperative  Airway & Oxygen Therapy: Patient Spontanous Breathing and Patient connected to face mask oxygen  Post-op Assessment: Report given to PACU RN and Post -op Vital signs reviewed and stable  Post vital signs: Reviewed and stable  Complications: No apparent anesthesia complications

## 2011-04-29 NOTE — Op Note (Signed)
Preoperative diagnosis:#1 gross hematuria #2 chronic cystitis #3 hydronephrosis Postoperative diagnosis:same  Procedure:cystoscopy, cystogram, right retrograde pyelogram, right ureteroscopy, right double-J stent placement   Surgeon: Valetta Fuller M.D.  Anesthesia: Gen.  Indications:Kathryn [name] is 78 years of age. She has been followed in the office for several years with recurrent cystitis and gross hematuria. Prior assessment has included CT scan with contrast, cystoscopy and urine cytologies which have failed to reveal any significant pathology. The stent was admitted recently with fever and chills and positive urine and blood cultures for Klebsiella. She was initially felt to have some left-sided hydronephrosis on ultrasound. CT scan did raise the concern now retroperitoneal air. Subsequent CT with and contrast did confirm a small amount of retro-peritoneal which totally resolved. The patient was initially felt to have left-sided hydronephrosis but most recent imaging has shown right-sided hydronephrosis. Her creatinine has waxed and waned but has remained elevated and the has not allowed for intravenous contrast studies. The patient is clinically improved with resolution of her abdominal pain, fever and leukocytosis but has continued to have renal insufficiency with intermittent hematuria. She presents now for full assessment of her urinary tract potentially stent placement if indeed obstructed renal units are appreciated. The patient has remained on Rocephin. We discussed this procedure with her and informed consent has been obtained of this assessment.     Technique and findings:patient was brought to the operating room she had successful induction of general anesthesia. She was placed in lithotomy position and prepped and draped in the usual manner. Appropriate surgical timeout was performed. Initial cystoscopy revealed what appeared to be a bladder with a markedly reduced functional capacity of no  more than 100-150ccs. There was no active bleeding from the bladder mucosa. There appeared to be some inflammatory changes at the dome of her bladder no definitive evidence of malignancy or obvious fistula. Both ureteral orifices were found to be markedly ectopic. There were extremely capacious with no evidence of any intramural tunnel and both were found in the posterior wall of the bladder. We suspected that both of these ureters would be refluxing and therefore we elected to do a cystogram with fluoroscopic guidance/interpretation. Contrast clearly refluxed into both ureters. On the left side the contrast went up to the renal pelvis suggesting that whatever minimal dilation/hydronephrosis has been appreciated on the left is clearly secondary to reflux. On the right side the dye stopped at the mid ureter. That reason an open-ended catheter was placed and retrograde positive he was performed. Again I was unable to get any dye beyond the mid ureter and up unable initially to place a guidewire past this area.   With the guidewire in position to the mid ureter we inserted a flexible scope up the right ureter to the level of the obstruction. We did not appreciate any obvious intrinsic process such as tumor or stone. The ureter appeared to abruptly divert towards the lateral direction. I was able to carefully feed a guidewire around the bend and up to the renal pelvis on that side.over the guidewire we were able to place an open-ended catheter. We obtained a large amount of cloudy bloody fluid. This fluid from the right renal pelvis was sent for both culture as well as cytology. Contrast was then instilled confirming that we were indeed in the dilated collecting system. Over the guidewire I placed an 8 French 24 cm double-J stent with visual as well as fluoroscopic guidance. Bladder washings were also done for cytology. The patient had  replacement of a Foley catheter. No obvious complications occurred the patient  appeared to tolerate the procedure well.  Assessment: Kathryn [name] clearly has a markedly reduced bladder capacity consistent with chronic cystitis. She has topic ureters that reflux. Her last system is not obstructed and any prior dilation there was noted was undoubtedly due to reflux. She does have an obstruction in the right mid ureter. There was no clear evidence of an intrinsic process but given the anatomy it was difficult to ascertain with certainty. Double-J stent was successfully placed and her renal function hot to improve. She will require follow up studies with either intravenous contrast CT scan if her renal function normalizes or potentially a repeat endoscopic procedure after her stent has been indwelling to better ascertain the etiology of the obstruction. There was no evidence of ureteral perforation.

## 2011-04-29 NOTE — Interval H&P Note (Signed)
History and Physical Interval Note:  04/29/2011 12:16 PM  Kathryn [name]  has presented today for surgery, with the diagnosis of gross hematuria  The various methods of treatment have been discussed with the patient and family. After consideration of risks, benefits and other options for treatment, the patient has consented to  Procedure(s) (LRB): CYSTOSCOPY WITH RETROGRADE PYELOGRAM/URETERAL STENT PLACEMENT (Bilateral) as a surgical intervention .  The patients' history has been reviewed, patient examined, no change in status, stable for surgery.  I have reviewed the patients' chart and labs.  Questions were answered to the patient's satisfaction.     Massa Pe S

## 2011-04-30 ENCOUNTER — Other Ambulatory Visit: Payer: Medicare Other

## 2011-04-30 ENCOUNTER — Ambulatory Visit: Payer: Medicare Other

## 2011-04-30 ENCOUNTER — Encounter: Payer: Self-pay | Admitting: *Deleted

## 2011-04-30 DIAGNOSIS — B9689 Other specified bacterial agents as the cause of diseases classified elsewhere: Secondary | ICD-10-CM

## 2011-04-30 DIAGNOSIS — N390 Urinary tract infection, site not specified: Secondary | ICD-10-CM

## 2011-04-30 LAB — COMPREHENSIVE METABOLIC PANEL
Alkaline Phosphatase: 321 U/L — ABNORMAL HIGH (ref 39–117)
BUN: 23 mg/dL (ref 6–23)
CO2: 19 mEq/L (ref 19–32)
Chloride: 109 mEq/L (ref 96–112)
Creatinine, Ser: 1.72 mg/dL — ABNORMAL HIGH (ref 0.50–1.10)
GFR calc non Af Amer: 28 mL/min — ABNORMAL LOW (ref >90)
Glucose, Bld: 116 mg/dL — ABNORMAL HIGH (ref 70–99)
Potassium: 4.3 mEq/L (ref 3.5–5.1)
Total Bilirubin: 0.2 mg/dL — ABNORMAL LOW (ref 0.3–1.2)

## 2011-04-30 LAB — CBC
HCT: 24.7 % — ABNORMAL LOW (ref 36.0–46.0)
Hemoglobin: 8 g/dL — ABNORMAL LOW (ref 12.0–15.0)
MCV: 95.4 fL (ref 78.0–100.0)
RBC: 2.59 MIL/uL — ABNORMAL LOW (ref 3.87–5.11)
RDW: 16.5 % — ABNORMAL HIGH (ref 11.5–15.5)
WBC: 6.4 10*3/uL (ref 4.0–10.5)

## 2011-04-30 LAB — GLUCOSE, CAPILLARY: Glucose-Capillary: 117 mg/dL — ABNORMAL HIGH (ref 70–99)

## 2011-04-30 MED FILL — Dextrose Inj 50%: INTRAVENOUS | Qty: 50 | Status: AC

## 2011-04-30 NOTE — Progress Notes (Signed)
Subjective: Feels better overall. Some abd pain. Still some nausea at times. Lying flat without dyspnea.   Objective: Vital signs in last 24 hours: Temp:  [97.3 F (36.3 C)-98.5 F (36.9 C)] 97.5 F (36.4 C) (02/22 0500) Pulse Rate:  [81-92] 84  (02/22 0500) Resp:  [13-25] 18  (02/22 0500) BP: (92-182)/(41-77) 123/73 mmHg (02/22 0500) SpO2:  [99 %-100 %] 99 % (02/22 0500)  Intake/Output from previous day: 02/21 0701 - 02/22 0700 In: 1570 [P.O.:120; I.V.:1450] Out: 2675 [Urine:2675] Intake/Output this shift:    General: alert anicteric oral membranes moist, neck supple lungs clear. Ht IR IR with murmur Abd large soft, non tender, good BS"s. Extrem 1+ edema. Alert, awake mentating well  Lab Results   Basename 04/30/11 0527 04/29/11 0500  WBC 6.4 8.6  RBC 2.59* 2.44*  HGB 8.0* 7.7*  HCT 24.7* 23.1*  MCV 95.4 94.7  MCH 30.9 31.6  RDW 16.5* 16.5*  PLT 359 307    Basename 04/30/11 0527 04/29/11 0500  NA 134* 131*  K 4.3 4.5  CL 109 106  CO2 19 19  GLUCOSE 116* 109*  BUN 23 27*  CREATININE 1.72* 1.83*  CALCIUM 8.4 8.4       Scheduled Meds:   . cefTRIAXone (ROCEPHIN)  IV  1 g Intravenous Q24H  . darifenacin  7.5 mg Oral Daily  . ferrous sulfate  325 mg Oral Q breakfast  . insulin aspart  0-15 Units Subcutaneous TID WC  . insulin aspart  5 Units Subcutaneous TID WC  . insulin glargine  30 Units Subcutaneous QHS  . metoprolol tartrate  25 mg Oral BID  . multivitamin  1 tablet Oral Daily  . pantoprazole  40 mg Oral Daily  . protein supplement  6 g Oral TID WC  . rosuvastatin  20 mg Oral Daily   Continuous Infusions:   . sodium chloride 125 mL/hr at 04/30/11 0744  . DISCONTD: lactated ringers     PRN Meds:acetaminophen, bisacodyl, dextrose, hydrALAZINE, HYDROcodone-acetaminophen, magic mouthwash, menthol-cetylpyridinium, morphine, ondansetron (ZOFRAN) IV, ondansetron, zolpidem, DISCONTD: 0.9 % irrigation (POUR BTL), DISCONTD: diatrizoate meglumine, DISCONTD:  fentaNYL, DISCONTD: iohexol, DISCONTD: promethazine, DISCONTD: sterile water for irrigation  Assessment/Plan:  PYELONEPHRITIS/SEPSIS FROM A URINARY SOURCE: Klebsiella in blood and urine. Now on rocephin. No fever. Continue IV Abx for now. Needs 14 days overall. Now on day 13 .  Now s/p stent placement. Sounds like there was a right sided blockage and fluid collection (? Abscess)  ABDOMINAL PAIN/VOMITING : Sl better.  RENAL INSUFFICIENCY: sl better , 1.73 HYDROURETER: See above  ATRIAL FIBRILLATION: Stable rate , no pain DM 2: FBS better at 116 HYPERTENSION: BP reasonable overall  ANEMIA: A lttle better at 8.90  HYPONATREMIA:   better at 134  CONSTIPATION:making progress  DISP: Will be going back to SNF   LOS: 13 days   Kathryn [name] 04/30/2011, 7:58 AM

## 2011-04-30 NOTE — Progress Notes (Signed)
Patient ID: Kathryn [name], female   DOB: Jan 01, 1934, 78 y.o.   MRN: [id number] 1 Day Post-Op Subjective: Patient reports Generally feeling better. She does have some mild right-sided abdominal discomfort. She is now status post right double-J stent placement.  Objective: Vital signs in last 24 hours: Temp:  [97.3 F (36.3 C)-98.5 F (36.9 C)] 97.5 F (36.4 C) (02/22 0500) Pulse Rate:  [84-92] 84  (02/22 0500) Resp:  [13-25] 18  (02/22 0500) BP: (92-182)/(41-77) 123/73 mmHg (02/22 0500) SpO2:  [99 %-100 %] 99 % (02/22 0500)  Intake/Output from previous day: 02/21 0701 - 02/22 0700 In: 1570 [P.O.:120; I.V.:1450] Out: 2675 [Urine:2675] Intake/Output this shift:    Physical Exam:  Constitutional: Vital signs reviewed. WD WN in NAD   Pulmonary/Chest: Normal effort Abdominal: Soft. Non-tender, non-distended, bowel sounds are normal, no masses, organomegaly, or guarding present.  Extremities: No cyanosis or edema   Lab Results:  Basename 04/30/11 0527 04/29/11 0500 04/28/11 0332  HGB 8.0* 7.7* 7.9*  HCT 24.7* 23.1* 23.8*   BMET  Basename 04/30/11 0527 04/29/11 0500  NA 134* 131*  K 4.3 4.5  CL 109 106  CO2 19 19  GLUCOSE 116* 109*  BUN 23 27*  CREATININE 1.72* 1.83*  CALCIUM 8.4 8.4   No results found for this basename: LABPT:3,INR:3 in the last 72 hours No results found for this basename: LABURIN:1 in the last 72 hours Results for orders placed during the hospital encounter of 04/17/11  CULTURE, BLOOD (ROUTINE X 2)     Status: Normal   Collection Time   04/17/11  6:45 PM      Component Value Range Status Comment   Specimen Description BLOOD RIGHT HAND   Final    Special Requests BOTTLES DRAWN AEROBIC AND ANAEROBIC 5 CC EACH   Final    Culture  Setup Time [id number]   Final    Culture     Final    Value: KLEBSIELLA PNEUMONIAE     Note: Gram Stain Report Called to,Read Back By and Verified With: [facility] MILLER 04/22/11 1535 BY SMITHERSJ   Report Status 04/24/2011  FINAL   Final    Organism ID, Bacteria KLEBSIELLA PNEUMONIAE   Final   URINE CULTURE     Status: Normal   Collection Time   04/17/11  7:31 PM      Component Value Range Status Comment   Specimen Description URINE, CATHETERIZED   Final    Special Requests NONE   Final    Culture  Setup Time [id number]   Final    Colony Count 85,000 COLONIES/ML   Final    Culture KLEBSIELLA PNEUMONIAE   Final    Report Status 04/19/2011 FINAL   Final    Organism ID, Bacteria KLEBSIELLA PNEUMONIAE   Final   CULTURE, BLOOD (ROUTINE X 2)     Status: Normal   Collection Time   04/17/11  9:50 PM      Component Value Range Status Comment   Specimen Description BLOOD LEFT ARM   Final    Special Requests BOTTLES DRAWN AEROBIC AND ANAEROBIC 5CC   Final    Culture  Setup Time [id number]   Final    Culture     Final    Value: KLEBSIELLA PNEUMONIAE     Note: CRITICAL RESULT CALLED TO, READ BACK BY AND VERIFIED WITH: Bretta Bang @ 840PM BY VINCJ 04/18/11   Report Status 04/20/2011 FINAL   Final    Organism ID, Bacteria KLEBSIELLA PNEUMONIAE  Final   MRSA PCR SCREENING     Status: Normal   Collection Time   04/18/11 12:17 AM      Component Value Range Status Comment   MRSA by PCR NEGATIVE  NEGATIVE  Final   CULTURE, BLOOD (ROUTINE X 2)     Status: Normal   Collection Time   04/19/11  9:50 AM      Component Value Range Status Comment   Specimen Description BLOOD LEFT HAND   Final    Special Requests BOTTLES DRAWN AEROBIC AND ANAEROBIC 10CC   Final    Culture  Setup Time [id number]   Final    Culture NO GROWTH 5 DAYS   Final    Report Status 04/25/2011 FINAL   Final   CULTURE, BLOOD (ROUTINE X 2)     Status: Normal   Collection Time   04/19/11 10:00 AM      Component Value Range Status Comment   Specimen Description BLOOD LEFT ARM   Final    Special Requests BOTTLES DRAWN AEROBIC AND ANAEROBIC 10CC   Final    Culture  Setup Time [id number]   Final    Culture NO GROWTH 5 DAYS   Final    Report Status  04/25/2011 FINAL   Final   SURGICAL PCR SCREEN     Status: Normal   Collection Time   04/28/11 10:25 AM      Component Value Range Status Comment   MRSA, PCR NEGATIVE  NEGATIVE  Final    Staphylococcus aureus NEGATIVE  NEGATIVE  Final     Studies/Results: No results found.  Assessment/Plan:   The plan is really as outlined in her operative report from yesterday. From my standpoint she can be transferred back to her nursing facility next week. Her renal function should continue to improve status post stent placement. She ultimately will need repeat ureteroscopy to further assess the right ureter. We will arrange appropriate followup office visit and procedures.   LOS: 13 days   Abigail Marsiglia S 04/30/2011, 2:07 PM

## 2011-04-30 NOTE — Progress Notes (Signed)
Subjective: Sp procedure, feels better   Antibiotics:  Anti-infectives     Start     Dose/Rate Route Frequency Ordered Stop   04/20/11 1400   cefTRIAXone (ROCEPHIN) 1 g in dextrose 5 % 50 mL IVPB        1 g 100 mL/hr over 30 Minutes Intravenous Every 24 hours 04/20/11 1337     04/19/11 1200   imipenem-cilastatin (PRIMAXIN) 250 mg in sodium chloride 0.9 % 100 mL IVPB  Status:  Discontinued        250 mg 200 mL/hr over 30 Minutes Intravenous 4 times per day 04/19/11 1045 04/20/11 1337   04/18/11 2200   cefTRIAXone (ROCEPHIN) 1 g in dextrose 5 % 50 mL IVPB  Status:  Discontinued        1 g 100 mL/hr over 30 Minutes Intravenous Daily at bedtime 04/18/11 0020 04/19/11 0913   04/18/11 0000   cefTRIAXone (ROCEPHIN) 1 g in dextrose 5 % 50 mL IVPB  Status:  Discontinued        1 g 100 mL/hr over 30 Minutes Intravenous Every 24 hours 04/17/11 2217 04/18/11 0020   04/17/11 2015   cefTRIAXone (ROCEPHIN) 1 g in dextrose 5 % 50 mL IVPB        1 g 100 mL/hr over 30 Minutes Intravenous  Once 04/17/11 2002 04/17/11 2307          Medications: Scheduled Meds:    . cefTRIAXone (ROCEPHIN)  IV  1 g Intravenous Q24H  . darifenacin  7.5 mg Oral Daily  . ferrous sulfate  325 mg Oral Q breakfast  . insulin aspart  0-15 Units Subcutaneous TID WC  . insulin aspart  5 Units Subcutaneous TID WC  . insulin glargine  30 Units Subcutaneous QHS  . metoprolol tartrate  25 mg Oral BID  . multivitamin  1 tablet Oral Daily  . pantoprazole  40 mg Oral Daily  . protein supplement  6 g Oral TID WC  . rosuvastatin  20 mg Oral Daily   Continuous Infusions:    . sodium chloride 125 mL/hr at 04/30/11 0744  . DISCONTD: lactated ringers     PRN Meds:.acetaminophen, bisacodyl, dextrose, hydrALAZINE, HYDROcodone-acetaminophen, magic mouthwash, menthol-cetylpyridinium, morphine, ondansetron (ZOFRAN) IV, ondansetron, zolpidem, DISCONTD: 0.9 % irrigation (POUR BTL), DISCONTD: diatrizoate meglumine, DISCONTD:  fentaNYL, DISCONTD: iohexol, DISCONTD: promethazine, DISCONTD: sterile water for irrigation   Objective: Weight change:   Intake/Output Summary (Last 24 hours) at 04/30/11 1333 Last data filed at 04/30/11 0500  Gross per 24 hour  Intake    470 ml  Output   1700 ml  Net  -1230 ml   Blood pressure 123/73, pulse 84, temperature 97.5 F (36.4 C), temperature source Oral, resp. rate 18, height 5\' 9"  (1.753 m), weight 233 lb 7.5 oz (105.9 kg), SpO2 99.00%. Temp:  [97.3 F (36.3 C)-98.5 F (36.9 C)] 97.5 F (36.4 C) (02/22 0500) Pulse Rate:  [84-92] 84  (02/22 0500) Resp:  [13-25] 18  (02/22 0500) BP: (92-182)/(41-77) 123/73 mmHg (02/22 0500) SpO2:  [99 %-100 %] 99 % (02/22 0500)  Physical Exam: General: Alert and awake, oriented x3, not in any acute distress. HEENT: anicteric sclera, pupils reactive to light and accommodation, EOMI CVS regular rate, normal r,  no murmur rubs or gallops Chest: clear to auscultation bilaterally, no wheezing, rales or rhonchi Abdomen: tender ruq rlq greater than rest of abdomen, pos bwoel sounds Extremities: no  clubbing or edema noted bilaterally Skin: no rashes Lymph: no new lymphadenopathy  Neuro: nonfocal  Lab Results:  Basename 04/30/11 0527 04/29/11 0500  WBC 6.4 8.6  HGB 8.0* 7.7*  HCT 24.7* 23.1*  PLT 359 307    BMET  Basename 04/30/11 0527 04/29/11 0500  NA 134* 131*  K 4.3 4.5  CL 109 106  CO2 19 19  GLUCOSE 116* 109*  BUN 23 27*  CREATININE 1.72* 1.83*  CALCIUM 8.4 8.4    Micro Results: Recent Results (from the past 240 hour(s))  SURGICAL PCR SCREEN     Status: Normal   Collection Time   04/28/11 10:25 AM      Component Value Range Status Comment   MRSA, PCR NEGATIVE  NEGATIVE  Final    Staphylococcus aureus NEGATIVE  NEGATIVE  Final     Studies/Results: No results found.    Assessment/Plan: Kathryn [name] is a 78 y.o. female with  kleb pneumonia complicated UTI with bacteremia, sp day 13 days of effeective  antibiotics, now with ? Perforation of right ureter based on findings on CT scan. She had gas collection on prior CT scan that has since resolved and there was also consideration of pathology in her viscera given this finding. Her Cystoscopy found obstruction in right ureter and large cloudy bloody fluid was taken and cultured  --I would continue the rocephinfor now (including beyond the 14 days) until we have better idea about the material found on cystoscopy and ? There is an abscess here. One could consider changing to po TMP/SMX at some point,b ut I would want to extend antibiotics in case we are dealing with an abscess --I agree on need for repeat imaging with CT vs repeat Cystoscopy   Dr. Ninetta Lights is on this weekend and will followup her cultures I will be back on Monday.   LOS: 13 days   Acey Lav 04/30/2011, 1:33 PM

## 2011-04-30 NOTE — Progress Notes (Signed)
Physical Therapy Treatment Patient Details Name: Kathryn [name] MRN: [id number] DOB: 1933-04-17 Today's Date: 04/30/2011  15:35 - 16:00 1 ta  1 gt   PT Assessment/Plan  PT - Assessment/Plan Comments on Treatment Session: Pt has been declining to participate in PT or get OOB all week. Max encouragement and + 2 assist. Pt plans to D/C back to [facility]. PT Plan: Discharge plan remains appropriate Follow Up Recommendations: Skilled nursing facility Equipment Recommended: Defer to next venue PT Goals  Acute Rehab PT Goals PT Goal Formulation: With patient Pt will go Supine/Side to Sit: with supervision PT Goal: Supine/Side to Sit - Progress: Progressing toward goal Pt will go Sit to Stand: with supervision PT Goal: Sit to Stand - Progress: Progressing toward goal Pt will Ambulate: 1 - 15 feet;with mod assist;with rolling walker PT Goal: Ambulate - Progress: Progressing toward goal  PT Treatment Precautions/Restrictions  Precautions Precautions: Fall Required Braces or Orthoses: No Restrictions Weight Bearing Restrictions: No Other Position/Activity Restrictions: pt lacks terminal extensionof both knees and needs to keep heels off bed. She may benefit from prevalon boot to provide pressure relief and eliminate pillow under knees Mobility (including Balance) Bed Mobility Bed Mobility: Yes Supine to Sit: 1: +2 Total assist Supine to Sit Details (indicate cue type and reason): Total Assist + 2 pt <25% with HOB elevated 45' and total assist to pivot around using pad Transfers Transfers: Yes Sit to Stand: From bed;1: +2 Total assist Sit to Stand Details (indicate cue type and reason): Total assist + 2 X 3 attemps pt 35% with MAX ENCOURAGEMEBT to increase participation.  Pt very fearful of falling, demon increase anxiety tremors throughout.  Great difficulty advancing/weight shifting to complete 1/4 turn from bed to the chair. Stand to Sit: 1: +2 Total assist Stand to Sit Details:  Total assist +2 for safety as pt quickly sat.  Pt unable to complete 1/4 turn to chair so bed and chair switched out behind her. RN/NT notified to use the lift to get pt back to bed. Lift pad placed under pt. Ambulation/Gait Ambulation/Gait: No (attempted amb however unable to functionally weight shift an) Stairs: No Wheelchair Mobility Wheelchair Mobility: No    Exercise    End of Session PT - End of Session Equipment Utilized During Treatment: Gait belt Activity Tolerance: Patient limited by fatigue;Patient limited by pain;Other (comment) (pain meds given) Patient left: in chair;with call bell in reach Nurse Communication: Need for lift equipment General Behavior During Session: [facility] for tasks performed Cognition: [facility] for tasks performed  Felecia Shelling  PTA [facility]  Acute  Rehab Pager     [phone]

## 2011-05-01 LAB — CBC
MCH: 30.4 pg (ref 26.0–34.0)
MCV: 95.4 fL (ref 78.0–100.0)
Platelets: 372 10*3/uL (ref 150–400)
RBC: 2.63 MIL/uL — ABNORMAL LOW (ref 3.87–5.11)

## 2011-05-01 LAB — URINE CULTURE
Colony Count: NO GROWTH
Culture  Setup Time: 201302220258

## 2011-05-01 LAB — COMPREHENSIVE METABOLIC PANEL
AST: 24 U/L (ref 0–37)
CO2: 18 mEq/L — ABNORMAL LOW (ref 19–32)
Calcium: 8.2 mg/dL — ABNORMAL LOW (ref 8.4–10.5)
Creatinine, Ser: 1.48 mg/dL — ABNORMAL HIGH (ref 0.50–1.10)
GFR calc Af Amer: 39 mL/min — ABNORMAL LOW (ref >90)
GFR calc non Af Amer: 33 mL/min — ABNORMAL LOW (ref >90)

## 2011-05-01 LAB — GLUCOSE, CAPILLARY
Glucose-Capillary: 136 mg/dL — ABNORMAL HIGH (ref 70–99)
Glucose-Capillary: 141 mg/dL — ABNORMAL HIGH (ref 70–99)
Glucose-Capillary: 108 mg/dL — ABNORMAL HIGH (ref 70–99)

## 2011-05-01 NOTE — Plan of Care (Signed)
Problem: Phase I Progression Outcomes Goal: OOB as tolerated unless otherwise ordered Outcome: Completed/Met Date Met:  05/01/11 oob with PT on 2/22 to chair

## 2011-05-01 NOTE — Progress Notes (Signed)
2 Days Post-Op  Subjective: Kathryn [name] is doing well with reduced pain following stent placement for clot obstruction of the right ureter.  Her Hgb is stable and her Cr is falling.  ROS:  She is otherwise without complaints.  Objective: Vital signs in last 24 hours: Temp:  [97.9 F (36.6 C)-98.6 F (37 C)] 98.4 F (36.9 C) (02/23 0329) Pulse Rate:  [64-88] 88  (02/23 0329) Resp:  [18-20] 20  (02/23 0329) BP: (115-165)/(67-88) 165/88 mmHg (02/23 0329) SpO2:  [95 %-97 %] 95 % (02/23 0329) Last BM Date: 04/28/11  Intake/Output from previous day: 02/22 0701 - 02/23 0700 In: 2243.8 [P.O.:360; I.V.:1883.8] Out: 1075 [Urine:1075] Intake/Output this shift:    General appearance: alert and no distress GI: soft, non-tender; bowel sounds normal; no masses,  no organomegaly Lungs: Clear to ausculation with normal effort. CV: RRR.  Lab Results:   Basename 05/01/11 0404 04/30/11 0527  WBC 4.6 6.4  HGB 8.0* 8.0*  HCT 25.1* 24.7*  PLT 372 359   BMET  Basename 05/01/11 0404 04/30/11 0527  NA 134* 134*  K 4.0 4.3  CL 108 109  CO2 18* 19  GLUCOSE 150* 116*  BUN 19 23  CREATININE 1.48* 1.72*  CALCIUM 8.2* 8.4   PT/INR No results found for this basename: LABPROT:2,INR:2 in the last 72 hours ABG No results found for this basename: PHART:2,PCO2:2,PO2:2,HCO3:2 in the last 72 hours  Studies/Results: No results found.  Anti-infectives: Anti-infectives     Start     Dose/Rate Route Frequency Ordered Stop   04/20/11 1400   cefTRIAXone (ROCEPHIN) 1 g in dextrose 5 % 50 mL IVPB        1 g 100 mL/hr over 30 Minutes Intravenous Every 24 hours 04/20/11 1337     04/19/11 1200   imipenem-cilastatin (PRIMAXIN) 250 mg in sodium chloride 0.9 % 100 mL IVPB  Status:  Discontinued        250 mg 200 mL/hr over 30 Minutes Intravenous 4 times per day 04/19/11 1045 04/20/11 1337   04/18/11 2200   cefTRIAXone (ROCEPHIN) 1 g in dextrose 5 % 50 mL IVPB  Status:  Discontinued        1 g 100  mL/hr over 30 Minutes Intravenous Daily at bedtime 04/18/11 0020 04/19/11 0913   04/18/11 0000   cefTRIAXone (ROCEPHIN) 1 g in dextrose 5 % 50 mL IVPB  Status:  Discontinued        1 g 100 mL/hr over 30 Minutes Intravenous Every 24 hours 04/17/11 2217 04/18/11 0020   04/17/11 2015   cefTRIAXone (ROCEPHIN) 1 g in dextrose 5 % 50 mL IVPB        1 g 100 mL/hr over 30 Minutes Intravenous  Once 04/17/11 2002 04/17/11 2307          Assessment/Plan: s/p Procedure(s) (LRB): CYSTOSCOPY WITH RETROGRADE PYELOGRAM/URETERAL STENT PLACEMENT (Bilateral) URETEROSCOPY (Right)  She is doing well with a further decline in her Cr. To 1.48.  She has minimal pain and has a good UOP with only slightly pink urine.  Her Hgb is stable at 8.  I would not recommend resumption of anticoagulants until she has been completely evaluated as that would likely cause a resumption of hematuria.   LOS: 14 days    Caylen Yardley J 05/01/2011

## 2011-05-01 NOTE — Progress Notes (Signed)
Subjective: Still some pain across low abd area but less than a day or two ago.  She was on xarelto until this admission.   Objective: Vital signs in last 24 hours: Temp:  [97.9 F (36.6 C)-98.6 F (37 C)] 98.4 F (36.9 C) (02/23 0329) Pulse Rate:  [64-88] 88  (02/23 0329) Resp:  [18-20] 20  (02/23 0329) BP: (115-165)/(67-88) 165/88 mmHg (02/23 0329) SpO2:  [95 %-97 %] 95 % (02/23 0329) Weight change:  Last BM Date: 04/28/11  Intake/Output from previous day: 02/22 0701 - 02/23 0700 In: 2243.8 [P.O.:360; I.V.:1883.8] Out: 1075 [Urine:1075] Intake/Output this shift:    General appearance: alert and cooperative Resp: clear to auscultation bilaterally Cardio: regular rate and rhythm, S1, S2 normal, no murmur, click, rub or gallop GI: soft, non-tender; bowel sounds normal; no masses,  no organomegaly Extremities: extremities normal, atraumatic, no cyanosis or edema Neurologic: Grossly normal   Lab Results:  Basename 05/01/11 0404 04/30/11 0527  WBC 4.6 6.4  HGB 8.0* 8.0*  HCT 25.1* 24.7*  PLT 372 359   BMET  Basename 05/01/11 0404 04/30/11 0527  NA 134* 134*  K 4.0 4.3  CL 108 109  CO2 18* 19  GLUCOSE 150* 116*  BUN 19 23  CREATININE 1.48* 1.72*  CALCIUM 8.2* 8.4   CMET CMP     Component Value Date/Time   NA 134* 05/01/2011 0404   K 4.0 05/01/2011 0404   CL 108 05/01/2011 0404   CO2 18* 05/01/2011 0404   GLUCOSE 150* 05/01/2011 0404   BUN 19 05/01/2011 0404   CREATININE 1.48* 05/01/2011 0404   CALCIUM 8.2* 05/01/2011 0404   CALCIUM 8.2* 10/10/2010 1613   PROT 5.0* 05/01/2011 0404   ALBUMIN 1.5* 05/01/2011 0404   AST 24 05/01/2011 0404   ALT 25 05/01/2011 0404   ALKPHOS 279* 05/01/2011 0404   BILITOT 0.1* 05/01/2011 0404   GFRNONAA 33* 05/01/2011 0404   GFRAA 39* 05/01/2011 0404     Studies/Results: No results found.  Medications: I have reviewed the patient's current medications.     . cefTRIAXone (ROCEPHIN)  IV  1 g Intravenous Q24H  . darifenacin  7.5 mg  Oral Daily  . ferrous sulfate  325 mg Oral Q breakfast  . insulin aspart  0-15 Units Subcutaneous TID WC  . insulin aspart  5 Units Subcutaneous TID WC  . insulin glargine  30 Units Subcutaneous QHS  . metoprolol tartrate  25 mg Oral BID  . multivitamin  1 tablet Oral Daily  . pantoprazole  40 mg Oral Daily  . protein supplement  6 g Oral TID WC  . rosuvastatin  20 mg Oral Daily   CBG (last 3)   Basename 04/30/11 2137 04/30/11 1645 04/30/11 1204  GLUCAP 166* 117* 138*       Assessment/Plan:  Principal Problem:  *Pyelonephritis, acute-on antibiotics and waiting for further direction from ID consultant. Active Problems:  DM-stable  HYPERTENSION, UNSPECIFIED-stable  Atrial fibrillation-off anticoagulation for now. Not sure when urology will feel comfortable resuming this. Rate controlled.  Anemia-stable, monitor daily  Renal insufficiency-improving.  Hyponatremia-stable.  Obesity-follow  Bacteremia due to Klebsiella pneumoniae-see above Disp: eventually back to SNF.   LOS: 14 days   Tripton Ned A, MD 05/01/2011, 12:23 PM

## 2011-05-02 LAB — COMPREHENSIVE METABOLIC PANEL
ALT: 22 U/L (ref 0–35)
AST: 24 U/L (ref 0–37)
Albumin: 1.5 g/dL — ABNORMAL LOW (ref 3.5–5.2)
CO2: 20 mEq/L (ref 19–32)
Chloride: 111 mEq/L (ref 96–112)
GFR calc non Af Amer: 38 mL/min — ABNORMAL LOW (ref >90)
Potassium: 4 mEq/L (ref 3.5–5.1)
Sodium: 137 mEq/L (ref 135–145)
Total Bilirubin: 0.1 mg/dL — ABNORMAL LOW (ref 0.3–1.2)

## 2011-05-02 LAB — GLUCOSE, CAPILLARY
Glucose-Capillary: 83 mg/dL (ref 70–99)
Glucose-Capillary: 131 mg/dL — ABNORMAL HIGH (ref 70–99)
Glucose-Capillary: 161 mg/dL — ABNORMAL HIGH (ref 70–99)

## 2011-05-02 LAB — CBC
MCV: 94.9 fL (ref 78.0–100.0)
Platelets: 354 10*3/uL (ref 150–400)
RBC: 2.72 MIL/uL — ABNORMAL LOW (ref 3.87–5.11)
RDW: 16.3 % — ABNORMAL HIGH (ref 11.5–15.5)
WBC: 5 10*3/uL (ref 4.0–10.5)

## 2011-05-02 MED ORDER — INSULIN GLARGINE 100 UNIT/ML ~~LOC~~ SOLN
25.0000 [IU] | Freq: Every day | SUBCUTANEOUS | Status: DC
  Administered 2011-05-02 – 2011-05-04 (×3): 25 [IU] via SUBCUTANEOUS

## 2011-05-02 NOTE — Progress Notes (Signed)
Subjective: Two episodes of emesis in last 12 hours.  Other than that stable.  She did keep some of her breakfast down. Still some pain.  Getting some morphine  With pain.   Objective: Vital signs in last 24 hours: Temp:  [98.2 F (36.8 C)-98.4 F (36.9 C)] 98.2 F (36.8 C) (02/24 0455) Pulse Rate:  [74-81] 81  (02/24 0455) Resp:  [16-18] 16  (02/24 0455) BP: (132-153)/(72-83) 132/78 mmHg (02/24 0455) SpO2:  [95 %-98 %] 95 % (02/24 0455) Weight change:  Last BM Date: 04/30/11  Intake/Output from previous day: 02/23 0701 - 02/24 0700 In: 3189.4 [P.O.:1560; I.V.:1579.4; IV Piggyback:50] Out: 3250 [Urine:3250] Intake/Output this shift: Total I/O In: 240 [P.O.:240] Out: -   General appearance: alert, cooperative and appears stated age Resp: clear to auscultation bilaterally Cardio: regular rate and rhythm, S1, S2 normal, no murmur, click, rub or gallop GI: soft, non-tender; bowel sounds normal; no masses,  no organomegaly obese, soft. Extremities: extremities normal, atraumatic, no cyanosis or edema Neurologic: Grossly normal   Lab Results:  Basename 05/02/11 0346 05/01/11 0404  WBC 5.0 4.6  HGB 8.2* 8.0*  HCT 25.8* 25.1*  PLT 354 372   BMET  Basename 05/02/11 0346 05/01/11 0404  NA 137 134*  K 4.0 4.0  CL 111 108  CO2 20 18*  GLUCOSE 102* 150*  BUN 18 19  CREATININE 1.34* 1.48*  CALCIUM 8.7 8.2*   CMET CMP     Component Value Date/Time   NA 137 05/02/2011 0346   K 4.0 05/02/2011 0346   CL 111 05/02/2011 0346   CO2 20 05/02/2011 0346   GLUCOSE 102* 05/02/2011 0346   BUN 18 05/02/2011 0346   CREATININE 1.34* 05/02/2011 0346   CALCIUM 8.7 05/02/2011 0346   CALCIUM 8.2* 10/10/2010 1613   PROT 5.2* 05/02/2011 0346   ALBUMIN 1.5* 05/02/2011 0346   AST 24 05/02/2011 0346   ALT 22 05/02/2011 0346   ALKPHOS 243* 05/02/2011 0346   BILITOT 0.1* 05/02/2011 0346   GFRNONAA 38* 05/02/2011 0346   GFRAA 44* 05/02/2011 0346     Studies/Results: No results  found.  Medications: I have reviewed the patient's current medications.  CBG (last 3)   Basename 05/02/11 0814 05/01/11 2048 05/01/11 1716  GLUCAP 83 108* 89       . cefTRIAXone (ROCEPHIN)  IV  1 g Intravenous Q24H  . darifenacin  7.5 mg Oral Daily  . ferrous sulfate  325 mg Oral Q breakfast  . insulin aspart  0-15 Units Subcutaneous TID WC  . insulin aspart  5 Units Subcutaneous TID WC  . insulin glargine  30 Units Subcutaneous QHS  . metoprolol tartrate  25 mg Oral BID  . multivitamin  1 tablet Oral Daily  . pantoprazole  40 mg Oral Daily  . protein supplement  6 g Oral TID WC  . rosuvastatin  20 mg Oral Daily     Assessment/Plan:  Principal Problem:  *Pyelonephritis, acute-no growth from 2/21 ucx.  On rocephin.  ID to direct further abx choice/duration. Nausea could be from morphine or the rocephin.  Suggested to her and nursing to try to go with tylenol for pain if possible.  Active Problems:  DM-stable  HYPERTENSION, UNSPECIFIED-stable.  Atrial fibrillation-off anticoag for now per urology recommendation.  Further testing per Dr. Isabel Caprice and then go from there potentially.  Anemia-stable.  Renal insufficiency-stable.  Hyponatremia-resolved.  Obesity-follow.  Bacteremia due to Klebsiella pneumoniae-cont. Iv rocephin for now.   LOS: 15 days  Ezequiel Kayser, MD 05/02/2011, 10:56 AM

## 2011-05-02 NOTE — Progress Notes (Signed)
Patient ID: ERCELL PERLMAN, female   DOB: 1933/07/31, 78 y.o.   MRN: [id number] 3 Days Post-Op  Subjective: Kathryn [name] is doing well and her urine continues to clear.  She is concerned about being off of her anticoagulation therapy. ROS: Negative.  Objective: Vital signs in last 24 hours: Temp:  [98.2 F (36.8 C)-98.4 F (36.9 C)] 98.2 F (36.8 C) (02/24 0455) Pulse Rate:  [74-81] 81  (02/24 0455) Resp:  [16-18] 16  (02/24 0455) BP: (132-153)/(72-83) 132/78 mmHg (02/24 0455) SpO2:  [95 %-98 %] 95 % (02/24 0455)  Intake/Output from previous day: 02/23 0701 - 02/24 0700 In: 3189.4 [P.O.:1560; I.V.:1579.4; IV Piggyback:50] Out: 3250 [Urine:3250] Intake/Output this shift:   Urine light amber in bag with good output.  General appearance: alert and no distress   Lab Results:   Basename 05/02/11 0346 05/01/11 0404  WBC 5.0 4.6  HGB 8.2* 8.0*  HCT 25.8* 25.1*  PLT 354 372   BMET  Basename 05/02/11 0346 05/01/11 0404  NA 137 134*  K 4.0 4.0  CL 111 108  CO2 20 18*  GLUCOSE 102* 150*  BUN 18 19  CREATININE 1.34* 1.48*  CALCIUM 8.7 8.2*   PT/INR No results found for this basename: LABPROT:2,INR:2 in the last 72 hours ABG No results found for this basename: PHART:2,PCO2:2,PO2:2,HCO3:2 in the last 72 hours  Studies/Results: No results found.  Anti-infectives: Anti-infectives     Start     Dose/Rate Route Frequency Ordered Stop   04/20/11 1400   cefTRIAXone (ROCEPHIN) 1 g in dextrose 5 % 50 mL IVPB        1 g 100 mL/hr over 30 Minutes Intravenous Every 24 hours 04/20/11 1337     04/19/11 1200   imipenem-cilastatin (PRIMAXIN) 250 mg in sodium chloride 0.9 % 100 mL IVPB  Status:  Discontinued        250 mg 200 mL/hr over 30 Minutes Intravenous 4 times per day 04/19/11 1045 04/20/11 1337   04/18/11 2200   cefTRIAXone (ROCEPHIN) 1 g in dextrose 5 % 50 mL IVPB  Status:  Discontinued        1 g 100 mL/hr over 30 Minutes Intravenous Daily at bedtime 04/18/11 0020  04/19/11 0913   04/18/11 0000   cefTRIAXone (ROCEPHIN) 1 g in dextrose 5 % 50 mL IVPB  Status:  Discontinued        1 g 100 mL/hr over 30 Minutes Intravenous Every 24 hours 04/17/11 2217 04/18/11 0020   04/17/11 2015   cefTRIAXone (ROCEPHIN) 1 g in dextrose 5 % 50 mL IVPB        1 g 100 mL/hr over 30 Minutes Intravenous  Once 04/17/11 2002 04/17/11 2307          Current Facility-Administered Medications  Medication Dose Route Frequency Provider Last Rate Last Dose  . 0.9 %  sodium chloride infusion   Intravenous Continuous Ezequiel Kayser, MD 75 mL/hr at 05/02/11 0254    . acetaminophen (TYLENOL) tablet 650 mg  650 mg Oral Q6H PRN Ravisankar R Avva, MD   650 mg at 04/21/11 1336  . bisacodyl (DULCOLAX) suppository 10 mg  10 mg Rectal Daily PRN Julian Hy, MD   10 mg at 04/22/11 1243  . cefTRIAXone (ROCEPHIN) 1 g in dextrose 5 % 50 mL IVPB  1 g Intravenous Q24H Cliffton Asters, MD   1 g at 05/01/11 1347  . darifenacin (ENABLEX) 24 hr tablet 7.5 mg  7.5 mg Oral Daily Ravisankar  R Avva, MD   7.5 mg at 05/01/11 0939  . ferrous sulfate tablet 325 mg  325 mg Oral Q breakfast Ravisankar R Avva, MD   325 mg at 05/01/11 0939  . hydrALAZINE (APRESOLINE) injection 10 mg  10 mg Intravenous Q6H PRN Kari Baars, MD   10 mg at 04/21/11 2344  . HYDROcodone-acetaminophen (NORCO) 5-325 MG per tablet 1 tablet  1 tablet Oral Q6H PRN Ravisankar R Avva, MD   1 tablet at 04/28/11 2211  . insulin aspart (novoLOG) injection 0-15 Units  0-15 Units Subcutaneous TID WC Ravisankar R Avva, MD   2 Units at 05/01/11 1403  . insulin aspart (novoLOG) injection 5 Units  5 Units Subcutaneous TID WC Ravisankar R Avva, MD   5 Units at 05/01/11 1830  . insulin glargine (LANTUS) injection 30 Units  30 Units Subcutaneous QHS Ravisankar R Avva, MD   30 Units at 05/01/11 2159  . magic mouthwash  5 mL Oral Q6H PRN Julian Hy, MD   5 mL at 04/20/11 2101  . menthol-cetylpyridinium (CEPACOL) lozenge 3 mg  1 lozenge Oral  PRN Julian Hy, MD   3 mg at 04/21/11 1338  . metoprolol tartrate (LOPRESSOR) tablet 25 mg  25 mg Oral BID Ravisankar R Avva, MD   25 mg at 05/01/11 2159  . morphine 4 MG/ML injection 4 mg  4 mg Intravenous Q2H PRN Ravisankar R Avva, MD   4 mg at 05/02/11 0606  . multivitamin (RENA-VIT) tablet 1 tablet  1 tablet Oral Daily Ravisankar R Avva, MD   1 tablet at 05/01/11 1124  . ondansetron (ZOFRAN) injection 4 mg  4 mg Intravenous Q6H PRN Ravisankar R Avva, MD   4 mg at 05/02/11 0352  . ondansetron (ZOFRAN) tablet 4 mg  4 mg Oral Q6H PRN Ravisankar R Avva, MD      . pantoprazole (PROTONIX) EC tablet 40 mg  40 mg Oral Daily Ravisankar R Avva, MD   40 mg at 05/01/11 1125  . protein supplement (RESOURCE BENEPROTEIN) powder packet 6 g  6 g Oral TID WC Marshall Cork, RD   6 g at 05/01/11 1720  . rosuvastatin (CRESTOR) tablet 20 mg  20 mg Oral Daily Ravisankar R Avva, MD   20 mg at 05/01/11 1124  . zolpidem (AMBIEN) tablet 5 mg  5 mg Oral QHS PRN Ravisankar R Avva, MD   5 mg at 05/01/11 2205    Assessment: s/p Procedure(s): CYSTOSCOPY WITH RETROGRADE PYELOGRAM/URETERAL STENT PLACEMENT URETEROSCOPY  Her urine has cleared.  She is concerned about being off of the anticoagulation but the souce of the bleeding has not been determined yet and I am concerned that she will bleed again if the anticoagulant is restarted.  Plan: She should remain off of anticoagulation for now.   Dr. Isabel Caprice will see her in the morning to determine what further diagnostic studies are required.   LOS: 15 days    Yachet Mattson J 05/02/2011

## 2011-05-03 LAB — CBC
Hemoglobin: 8.3 g/dL — ABNORMAL LOW (ref 12.0–15.0)
MCH: 30.5 pg (ref 26.0–34.0)
MCHC: 32.3 g/dL (ref 30.0–36.0)
Platelets: 348 10*3/uL (ref 150–400)
RBC: 2.72 MIL/uL — ABNORMAL LOW (ref 3.87–5.11)

## 2011-05-03 LAB — COMPREHENSIVE METABOLIC PANEL
ALT: 23 U/L (ref 0–35)
AST: 24 U/L (ref 0–37)
Alkaline Phosphatase: 219 U/L — ABNORMAL HIGH (ref 39–117)
CO2: 20 mEq/L (ref 19–32)
Calcium: 8.4 mg/dL (ref 8.4–10.5)
Glucose, Bld: 109 mg/dL — ABNORMAL HIGH (ref 70–99)
Potassium: 3.8 mEq/L (ref 3.5–5.1)
Sodium: 136 mEq/L (ref 135–145)
Total Protein: 5.2 g/dL — ABNORMAL LOW (ref 6.0–8.3)

## 2011-05-03 LAB — GLUCOSE, CAPILLARY
Glucose-Capillary: 81 mg/dL (ref 70–99)
Glucose-Capillary: 122 mg/dL — ABNORMAL HIGH (ref 70–99)
Glucose-Capillary: 111 mg/dL — ABNORMAL HIGH (ref 70–99)

## 2011-05-03 MED ORDER — RIVAROXABAN 15 MG PO TABS
15.0000 mg | ORAL_TABLET | Freq: Every day | ORAL | Status: DC
  Administered 2011-05-03: 15 mg via ORAL
  Filled 2011-05-03 (×2): qty 1

## 2011-05-03 MED ORDER — DIPHENHYDRAMINE HCL 25 MG PO CAPS
25.0000 mg | ORAL_CAPSULE | Freq: Three times a day (TID) | ORAL | Status: DC | PRN
  Administered 2011-05-03 – 2011-05-06 (×6): 25 mg via ORAL
  Filled 2011-05-03 (×6): qty 1

## 2011-05-03 MED ORDER — CEPHALEXIN 500 MG PO CAPS
1000.0000 mg | ORAL_CAPSULE | Freq: Two times a day (BID) | ORAL | Status: AC
  Administered 2011-05-03 – 2011-05-05 (×4): 1000 mg via ORAL
  Filled 2011-05-03 (×4): qty 2

## 2011-05-03 NOTE — Progress Notes (Signed)
Subjective: More nausea over the weekend,    Antibiotics:  Anti-infectives     Start     Dose/Rate Route Frequency Ordered Stop   04/20/11 1400   cefTRIAXone (ROCEPHIN) 1 g in dextrose 5 % 50 mL IVPB        1 g 100 mL/hr over 30 Minutes Intravenous Every 24 hours 04/20/11 1337     04/19/11 1200   imipenem-cilastatin (PRIMAXIN) 250 mg in sodium chloride 0.9 % 100 mL IVPB  Status:  Discontinued        250 mg 200 mL/hr over 30 Minutes Intravenous 4 times per day 04/19/11 1045 04/20/11 1337   04/18/11 2200   cefTRIAXone (ROCEPHIN) 1 g in dextrose 5 % 50 mL IVPB  Status:  Discontinued        1 g 100 mL/hr over 30 Minutes Intravenous Daily at bedtime 04/18/11 0020 04/19/11 0913   04/18/11 0000   cefTRIAXone (ROCEPHIN) 1 g in dextrose 5 % 50 mL IVPB  Status:  Discontinued        1 g 100 mL/hr over 30 Minutes Intravenous Every 24 hours 04/17/11 2217 04/18/11 0020   04/17/11 2015   cefTRIAXone (ROCEPHIN) 1 g in dextrose 5 % 50 mL IVPB        1 g 100 mL/hr over 30 Minutes Intravenous  Once 04/17/11 2002 04/17/11 2307          Medications: Scheduled Meds:    . cefTRIAXone (ROCEPHIN)  IV  1 g Intravenous Q24H  . darifenacin  7.5 mg Oral Daily  . ferrous sulfate  325 mg Oral Q breakfast  . insulin aspart  0-15 Units Subcutaneous TID WC  . insulin aspart  5 Units Subcutaneous TID WC  . insulin glargine  25 Units Subcutaneous QHS  . metoprolol tartrate  25 mg Oral BID  . multivitamin  1 tablet Oral Daily  . pantoprazole  40 mg Oral Daily  . protein supplement  6 g Oral TID WC  . rosuvastatin  20 mg Oral Daily   Continuous Infusions:    . sodium chloride 75 mL/hr at 05/03/11 0459   PRN Meds:.acetaminophen, bisacodyl, diphenhydrAMINE, hydrALAZINE, HYDROcodone-acetaminophen, magic mouthwash, menthol-cetylpyridinium, morphine, ondansetron (ZOFRAN) IV, ondansetron, zolpidem   Objective: Weight change:   Intake/Output Summary (Last 24 hours) at 05/03/11 1611 Last data filed at  05/03/11 1347  Gross per 24 hour  Intake 1692.5 ml  Output   3450 ml  Net -1757.5 ml   Blood pressure 160/81, pulse 92, temperature 98.2 F (36.8 C), temperature source Axillary, resp. rate 18, height 5\' 9"  (1.753 m), weight 233 lb 7.5 oz (105.9 kg), SpO2 97.00%. Temp:  [97.9 F (36.6 C)-98.2 F (36.8 C)] 98.2 F (36.8 C) (02/25 1346) Pulse Rate:  [63-92] 92  (02/25 1346) Resp:  [18-20] 18  (02/25 1346) BP: (153-160)/(78-81) 160/81 mmHg (02/25 1346) SpO2:  [95 %-97 %] 97 % (02/25 1346)  Physical Exam: General: Alert and awake, oriented x3, not in any acute distress. HEENT: anicteric sclera, pupils reactive to light and accommodation, EOMI CVS regular rate, normal r,  no murmur rubs or gallops Chest: clear to auscultation bilaterally, no wheezing, rales or rhonchi Abdomen: tender ruq rlq greater than rest of abdomen, pos bwoel sounds Extremities: no  clubbing or edema noted bilaterally Skin: no rashes Lymph: no new lymphadenopathy Neuro: nonfocal  Lab Results:  Basename 05/03/11 0325 05/02/11 0346  WBC 5.2 5.0  HGB 8.3* 8.2*  HCT 25.7* 25.8*  PLT 348 354  BMET  Basename 05/03/11 0325 05/02/11 0346  NA 136 137  K 3.8 4.0  CL 110 111  CO2 20 20  GLUCOSE 109* 102*  BUN 15 18  CREATININE 1.23* 1.34*  CALCIUM 8.4 8.7    Micro Results: Recent Results (from the past 240 hour(s))  SURGICAL PCR SCREEN     Status: Normal   Collection Time   04/28/11 10:25 AM      Component Value Range Status Comment   MRSA, PCR NEGATIVE  NEGATIVE  Final    Staphylococcus aureus NEGATIVE  NEGATIVE  Final   URINE CULTURE     Status: Normal   Collection Time   04/29/11  1:44 PM      Component Value Range Status Comment   Specimen Description KIDNEY RIGHT   Final    Special Requests NONE   Final    Culture  Setup Time [id number]   Final    Colony Count NO GROWTH   Final    Culture NO GROWTH   Final    Report Status 05/01/2011 FINAL   Final     Studies/Results: No results  found.    Assessment/Plan: Kathryn [name] is a 78 y.o. female with  kleb pneumonia complicated UTI with bacteremia, sp day 13 days of effeective antibiotics, now with ? Perforation of right ureter based on findings on CT scan. She had gas collection on prior CT scan that has since resolved and there was also consideration of pathology in her viscera given this finding. Her Cystoscopy found obstruction in right ureter and large cloudy bloody fluid was taken and cultured and without growth  --I will dC the rocephin at this point and change to PO bactrim and make sure that she finishes 7 days of antibiotics  Post procedure --my main concern is WHY DID she have this gas collection present?? And if it did not represent ureteral injury OR GI pathology, then how did it arise?, I would think she will need repeat CT and or cystoscopy in the next month and low threshold to reimage in interim should her clinical condition change    LOS: 16 days   Acey Lav 05/03/2011, 4:11 PM

## 2011-05-03 NOTE — Progress Notes (Signed)
ANTICOAGULATION CONSULT NOTE - Initial Consult  Pharmacy Consult for Xarelto Indication: Afib  Allergies  Allergen Reactions  . Celebrex (Celecoxib)   . Nsaids   . Sulfa Antibiotics     Patient Measurements: Height: 5\' 9"  (175.3 cm) Weight: 233 lb 7.5 oz (105.9 kg) IBW/kg (Calculated) : 66.2    Vital Signs: Temp: 98.2 F (36.8 C) (02/25 1346) Temp src: Axillary (02/25 1346) BP: 160/81 mmHg (02/25 1346) Pulse Rate: 92  (02/25 1346)  Labs:  Basename 05/03/11 0325 05/02/11 0346 05/01/11 0404  HGB 8.3* 8.2* --  HCT 25.7* 25.8* 25.1*  PLT 348 354 372  APTT -- -- --  LABPROT -- -- --  INR -- -- --  HEPARINUNFRC -- -- --  CREATININE 1.23* 1.34* 1.48*  CKTOTAL -- -- --  CKMB -- -- --  TROPONINI -- -- --   Estimated Creatinine Clearance: 51.2 ml/min (by C-G formula based on Cr of 1.23).  Medical History: Past Medical History  Diagnosis Date  . Osteoporosis   . DM (diabetes mellitus)   . HTN (hypertension)     unspecified  . Atrial fibrillation   . Macular degeneration   . Osteoarthritis   . Edema   . Anemia   . Hydronephrosis     left  . UTI (lower urinary tract infection)     Medications:  Prescriptions prior to admission  Medication Sig Dispense Refill  . acetaminophen (TYLENOL) 325 MG tablet Take 650 mg by mouth every 6 (six) hours as needed. pain      . atorvastatin (LIPITOR) 20 MG tablet Take 20 mg by mouth daily.        . Calcium Carbonate-Vitamin D 600-400 MG-UNIT per tablet Take 1 tablet by mouth 2 (two) times daily.        . ferrous sulfate 325 (65 FE) MG tablet Take 325 mg by mouth daily with breakfast.        . HYDROcodone-acetaminophen (NORCO) 5-325 MG per tablet Take 1 tablet by mouth every 6 (six) hours as needed.       . insulin aspart (NOVOLOG) 100 UNIT/ML injection Inject 5 Units into the skin 3 (three) times daily with meals. 5 units tid if cbg > 200      . insulin glargine (LANTUS) 100 UNIT/ML injection Inject 30 Units into the skin daily.   10 mL  0  . losartan (COZAAR) 100 MG tablet Take 100 mg by mouth daily.        . metoprolol tartrate (LOPRESSOR) 25 MG tablet Take 25 mg by mouth 2 (two) times daily.       . multivitamin (RENA-VIT) TABS tablet Take 1 tablet by mouth daily.      . multivitamin (THERAGRAN) per tablet Take 1 tablet by mouth daily.      . pantoprazole (PROTONIX) 40 MG tablet Take 40 mg by mouth daily.        . polyethylene glycol (MIRALAX / GLYCOLAX) packet Take 17 g by mouth daily.      . Rivaroxaban (XARELTO) 15 MG TABS tablet Take 1 tablet (15 mg total) by mouth daily.  30 tablet  0  . sennosides-docusate sodium (SENOKOT-S) 8.6-50 MG tablet Take 2 tablets by mouth at bedtime.        . solifenacin (VESICARE) 5 MG tablet Take 10 mg by mouth daily.        Marland Kitchen zolpidem (AMBIEN) 5 MG tablet Take 5 mg by mouth at bedtime as needed. For sleep.  Scheduled:    . cefTRIAXone (ROCEPHIN)  IV  1 g Intravenous Q24H  . darifenacin  7.5 mg Oral Daily  . ferrous sulfate  325 mg Oral Q breakfast  . insulin aspart  0-15 Units Subcutaneous TID WC  . insulin aspart  5 Units Subcutaneous TID WC  . insulin glargine  25 Units Subcutaneous QHS  . metoprolol tartrate  25 mg Oral BID  . multivitamin  1 tablet Oral Daily  . pantoprazole  40 mg Oral Daily  . protein supplement  6 g Oral TID WC  . rosuvastatin  20 mg Oral Daily   Infusions:    . sodium chloride 75 mL/hr at 05/03/11 0459   PRN: acetaminophen, bisacodyl, diphenhydrAMINE, hydrALAZINE, HYDROcodone-acetaminophen, magic mouthwash, menthol-cetylpyridinium, morphine, ondansetron (ZOFRAN) IV, ondansetron, zolpidem  Assessment: 77 YOF on Xarelto PTA for hx Afib Xarelto has been held for gross hematuria, s/p urologic procedure (cystoscopy w/retrograde pyelogram/bilateral ureteral stent placement) 04/29/2011. Hematuria has resolved per RN report and stated that pt had been examined by Dr Isabel Caprice who felt it would be appropriate to resume Xarelto at this time. Xarelto is  now to resume post op per Dr Evlyn Kanner On renally adjusted dose of Xarelto (15mg  daily for CrCl <21ml/min) Renal function has been improving and CrCl is just above 48ml/min now  Goal of Therapy:  Appropriate renal dose of Xarelto   Plan:  Will continue with 15mg  of Xarelto daily as per home dose Will plan to f/u Scr tomorrow and increase dose to 20mg  daily if CrCl is still >78ml/min and if hematuria is still resolved.  Gwen Her PharmD  [phone] 05/03/2011 4:27 PM

## 2011-05-03 NOTE — Progress Notes (Signed)
CSW continues to follow for return to [facility]. CSW reviewed chart. Pt still not medically ready for d/c. CSW will continue to follow and assist.  Vennie Homans, LCSWA 05/03/2011 8:17 AM [phone]

## 2011-05-03 NOTE — Progress Notes (Signed)
Patient ID: Kathryn [name], female   DOB: Mar 12, 1933, 78 y.o.   MRN: [id number] 4 Days Post-Op Subjective: Patient reports Some right-sided abdominal discomfort with intermittent nausea. Very little hematuria at this point. She remains off her anticoagulation.  Objective: Vital signs in last 24 hours: Temp:  [97.9 F (36.6 C)-98.2 F (36.8 C)] 98.2 F (36.8 C) (02/25 1346) Pulse Rate:  [63-92] 92  (02/25 1346) Resp:  [18-20] 18  (02/25 1346) BP: (153-160)/(78-81) 160/81 mmHg (02/25 1346) SpO2:  [95 %-97 %] 97 % (02/25 1346)  Intake/Output from previous day: 02/24 0701 - 02/25 0700 In: 2052.5 [P.O.:860; I.V.:1192.5] Out: 2550 [Urine:2550] Intake/Output this shift: Total I/O In: 480 [P.O.:480] Out: 900 [Urine:900]  Physical Exam:  Constitutional: Vital signs reviewed. WD WN in NAD   Pulmonary/Chest: Normal effort Abdominal: Soft. Non-tender, non-distended, bowel sounds are normal, no masses, organomegaly, or guarding present.  Extremities: No cyanosis or edema   Lab Results:  Basename 05/03/11 0325 05/02/11 0346 05/01/11 0404  HGB 8.3* 8.2* 8.0*  HCT 25.7* 25.8* 25.1*   BMET  Basename 05/03/11 0325 05/02/11 0346  NA 136 137  K 3.8 4.0  CL 110 111  CO2 20 20  GLUCOSE 109* 102*  BUN 15 18  CREATININE 1.23* 1.34*  CALCIUM 8.4 8.7   No results found for this basename: LABPT:3,INR:3 in the last 72 hours No results found for this basename: LABURIN:1 in the last 72 hours Results for orders placed during the hospital encounter of 04/17/11  CULTURE, BLOOD (ROUTINE X 2)     Status: Normal   Collection Time   04/17/11  6:45 PM      Component Value Range Status Comment   Specimen Description BLOOD RIGHT HAND   Final    Special Requests BOTTLES DRAWN AEROBIC AND ANAEROBIC 5 CC EACH   Final    Culture  Setup Time [id number]   Final    Culture     Final    Value: KLEBSIELLA PNEUMONIAE     Note: Gram Stain Report Called to,Read Back By and Verified With: [facility] MILLER 04/22/11  1535 BY SMITHERSJ   Report Status 04/24/2011 FINAL   Final    Organism ID, Bacteria KLEBSIELLA PNEUMONIAE   Final   URINE CULTURE     Status: Normal   Collection Time   04/17/11  7:31 PM      Component Value Range Status Comment   Specimen Description URINE, CATHETERIZED   Final    Special Requests NONE   Final    Culture  Setup Time [id number]   Final    Colony Count 85,000 COLONIES/ML   Final    Culture KLEBSIELLA PNEUMONIAE   Final    Report Status 04/19/2011 FINAL   Final    Organism ID, Bacteria KLEBSIELLA PNEUMONIAE   Final   CULTURE, BLOOD (ROUTINE X 2)     Status: Normal   Collection Time   04/17/11  9:50 PM      Component Value Range Status Comment   Specimen Description BLOOD LEFT ARM   Final    Special Requests BOTTLES DRAWN AEROBIC AND ANAEROBIC 5CC   Final    Culture  Setup Time [id number]   Final    Culture     Final    Value: KLEBSIELLA PNEUMONIAE     Note: CRITICAL RESULT CALLED TO, READ BACK BY AND VERIFIED WITH: Bretta Bang @ 840PM BY VINCJ 04/18/11   Report Status 04/20/2011 FINAL   Final    Organism  ID, Bacteria KLEBSIELLA PNEUMONIAE   Final   MRSA PCR SCREENING     Status: Normal   Collection Time   04/18/11 12:17 AM      Component Value Range Status Comment   MRSA by PCR NEGATIVE  NEGATIVE  Final   CULTURE, BLOOD (ROUTINE X 2)     Status: Normal   Collection Time   04/19/11  9:50 AM      Component Value Range Status Comment   Specimen Description BLOOD LEFT HAND   Final    Special Requests BOTTLES DRAWN AEROBIC AND ANAEROBIC 10CC   Final    Culture  Setup Time [id number]   Final    Culture NO GROWTH 5 DAYS   Final    Report Status 04/25/2011 FINAL   Final   CULTURE, BLOOD (ROUTINE X 2)     Status: Normal   Collection Time   04/19/11 10:00 AM      Component Value Range Status Comment   Specimen Description BLOOD LEFT ARM   Final    Special Requests BOTTLES DRAWN AEROBIC AND ANAEROBIC 10CC   Final    Culture  Setup Time [id number]   Final     Culture NO GROWTH 5 DAYS   Final    Report Status 04/25/2011 FINAL   Final   SURGICAL PCR SCREEN     Status: Normal   Collection Time   04/28/11 10:25 AM      Component Value Range Status Comment   MRSA, PCR NEGATIVE  NEGATIVE  Final    Staphylococcus aureus NEGATIVE  NEGATIVE  Final   URINE CULTURE     Status: Normal   Collection Time   04/29/11  1:44 PM      Component Value Range Status Comment   Specimen Description KIDNEY RIGHT   Final    Special Requests NONE   Final    Culture  Setup Time [id number]   Final    Colony Count NO GROWTH   Final    Culture NO GROWTH   Final    Report Status 05/01/2011 FINAL   Final     Studies/Results: No results found.  Assessment/Plan:   Kathryn [name] continues to have improvement in her renal function. I fully expect her to have some right-sided abdominal pain given her double-J stent. The nausea may be secondary to medication. She can be restarted on her anticoagulation. She will eventually need reassessment of her right ureter with ureteroscopy and either stent removal or exchange sometime in the next several weeks. Urine cytology from her bladder as well as her renal pelvis was negative for malignancy.   LOS: 16 days   Kathryn [name] 05/03/2011, 5:07 PM

## 2011-05-03 NOTE — Progress Notes (Signed)
...  OT Cancellation Note  Treatment cancelled today due to pt. States she has been very sick over the weekend and needs to rest. States she is also is afraid to move because she thinks she will vomit.    Will check back as time permits.   Tommy Medal, COTA/L Acute Rehabilitation

## 2011-05-03 NOTE — Progress Notes (Signed)
Subjective: Had recurrent episodes on nausea and vomiting yesterday afternoon. Slept little Not much nausea this AM. Bowels worked yesterday. Quite weak  Objective: Vital signs in last 24 hours: Temp:  [97.9 F (36.6 C)-98.4 F (36.9 C)] 97.9 F (36.6 C) (02/25 0450) Pulse Rate:  [63-85] 63  (02/25 0450) Resp:  [17-20] 20  (02/25 0450) BP: (148-157)/(70-81) 157/81 mmHg (02/25 0450) SpO2:  [95 %-97 %] 97 % (02/25 0450)  Intake/Output from previous day: 02/24 0701 - 02/25 0700 In: 2052.5 [P.O.:860; I.V.:1192.5] Out: 2550 [Urine:2550] Intake/Output this shift:    General: fatigued, anicteric, face symmetric. Lungs clear. Ht IR/IR, abd obese, large with dimished bowel sounds. Alert awake, mentating OK  Lab Results   [facility] 05/03/11 0325 05/02/11 0346  WBC 5.2 5.0  RBC 2.72* 2.72*  HGB 8.3* 8.2*  HCT 25.7* 25.8*  MCV 94.5 94.9  MCH 30.5 30.1  RDW 16.3* 16.3*  PLT 348 354    Basename 05/03/11 0325 05/02/11 0346  NA 136 137  K 3.8 4.0  CL 110 111  CO2 20 20  GLUCOSE 109* 102*  BUN 15 18  CREATININE 1.23* 1.34*  CALCIUM 8.4 8.7    Studies/Results: No results found.  Scheduled Meds:   . cefTRIAXone (ROCEPHIN)  IV  1 g Intravenous Q24H  . darifenacin  7.5 mg Oral Daily  . ferrous sulfate  325 mg Oral Q breakfast  . insulin aspart  0-15 Units Subcutaneous TID WC  . insulin aspart  5 Units Subcutaneous TID WC  . insulin glargine  25 Units Subcutaneous QHS  . metoprolol tartrate  25 mg Oral BID  . multivitamin  1 tablet Oral Daily  . pantoprazole  40 mg Oral Daily  . protein supplement  6 g Oral TID WC  . rosuvastatin  20 mg Oral Daily  . DISCONTD: insulin glargine  30 Units Subcutaneous QHS   Continuous Infusions:   . sodium chloride 75 mL/hr at 05/03/11 0459   PRN Meds:acetaminophen, bisacodyl, hydrALAZINE, HYDROcodone-acetaminophen, magic mouthwash, menthol-cetylpyridinium, morphine, ondansetron (ZOFRAN) IV, ondansetron, zolpidem  Assessment/Plan:  *Pyelonephritis, acute-no growth from 2/21 ucx. Still On rocephin.  .  Nausea could be from morphine or the rocephin. Still an issue Active Problems:  DM-stable  HYPERTENSION, UNSPECIFIED-stable.  Atrial fibrillation-off anticoag for now per urology recommendation. Further testing per Dr. Isabel Caprice and then go from there potentially.  Anemia-stable.  Renal insufficiency-stable., sl better today  Hyponatremia-resolved. OK at 136 Obesity-follow.  Bacteremia due to Klebsiella pneumoniae-cont. Iv rocephin for now.   LOS: 16 days   Morgin Halls [name] 05/03/2011, 7:21 AM

## 2011-05-04 LAB — GLUCOSE, CAPILLARY
Glucose-Capillary: 57 mg/dL — ABNORMAL LOW (ref 70–99)
Glucose-Capillary: 70 mg/dL (ref 70–99)
Glucose-Capillary: 126 mg/dL — ABNORMAL HIGH (ref 70–99)
Glucose-Capillary: 89 mg/dL (ref 70–99)

## 2011-05-04 LAB — CBC
MCHC: 31.9 g/dL (ref 30.0–36.0)
Platelets: 315 10*3/uL (ref 150–400)
RDW: 16.4 % — ABNORMAL HIGH (ref 11.5–15.5)
WBC: 5.1 10*3/uL (ref 4.0–10.5)

## 2011-05-04 LAB — COMPREHENSIVE METABOLIC PANEL
ALT: 21 U/L (ref 0–35)
AST: 22 U/L (ref 0–37)
Albumin: 1.7 g/dL — ABNORMAL LOW (ref 3.5–5.2)
Alkaline Phosphatase: 193 U/L — ABNORMAL HIGH (ref 39–117)
Chloride: 109 mEq/L (ref 96–112)
Potassium: 3.7 mEq/L (ref 3.5–5.1)
Sodium: 136 mEq/L (ref 135–145)
Total Bilirubin: 0.1 mg/dL — ABNORMAL LOW (ref 0.3–1.2)
Total Protein: 5.1 g/dL — ABNORMAL LOW (ref 6.0–8.3)

## 2011-05-04 NOTE — Progress Notes (Signed)
PT/OT/ST Cancellation Note  ___Treatment cancelled today due to medical issues with patient which prohibited therapy  ___ Treatment cancelled today due to patient receiving procedure or test   _x__ Treatment cancelled today due to patient's refusal to participate this a.m. Pt states she "wants to eat and be able to hold her food down. I can't get up until I'm well."  ___ Treatment cancelled today due to   Signature: Rebeca Alert, PT  [phone] Garrel Ridgel, OTR/L [phone]

## 2011-05-04 NOTE — Progress Notes (Addendum)
ANTICOAGULATION CONSULT NOTE - follow up  Pharmacy Consult for Xarelto Indication: Afib  Allergies  Allergen Reactions  . Celebrex (Celecoxib)   . Nsaids   . Sulfa Antibiotics    Patient Measurements: Height: 5\' 9"  (175.3 cm) Weight: 233 lb 7.5 oz (105.9 kg) IBW/kg (Calculated) : 66.2   Vital Signs: Temp: 98.6 F (37 C) (02/26 0550) Temp src: Oral (02/26 0550) BP: 162/76 mmHg (02/26 0550) Pulse Rate: 69  (02/26 0550)  Labs:  Basename 05/04/11 0334 05/03/11 0325 05/02/11 0346  HGB 8.1* 8.3* --  HCT 25.4* 25.7* 25.8*  PLT 315 348 354  APTT -- -- --  LABPROT -- -- --  INR -- -- --  HEPARINUNFRC -- -- --  CREATININE 1.19* 1.23* 1.34*  CKTOTAL -- -- --  CKMB -- -- --  TROPONINI -- -- --   Estimated Creatinine Clearance: 52.9 ml/min (by C-G formula based on Cr of 1.19).  Medical History: Past Medical History  Diagnosis Date  . Osteoporosis   . DM (diabetes mellitus)   . HTN (hypertension)     unspecified  . Atrial fibrillation   . Macular degeneration   . Osteoarthritis   . Edema   . Anemia   . Hydronephrosis     left  . UTI (lower urinary tract infection)     Medications:  Prescriptions prior to admission  Medication Sig Dispense Refill  . acetaminophen (TYLENOL) 325 MG tablet Take 650 mg by mouth every 6 (six) hours as needed. pain      . atorvastatin (LIPITOR) 20 MG tablet Take 20 mg by mouth daily.        . Calcium Carbonate-Vitamin D 600-400 MG-UNIT per tablet Take 1 tablet by mouth 2 (two) times daily.        . ferrous sulfate 325 (65 FE) MG tablet Take 325 mg by mouth daily with breakfast.        . HYDROcodone-acetaminophen (NORCO) 5-325 MG per tablet Take 1 tablet by mouth every 6 (six) hours as needed.       . insulin aspart (NOVOLOG) 100 UNIT/ML injection Inject 5 Units into the skin 3 (three) times daily with meals. 5 units tid if cbg > 200      . insulin glargine (LANTUS) 100 UNIT/ML injection Inject 30 Units into the skin daily.  10 mL  0  .  losartan (COZAAR) 100 MG tablet Take 100 mg by mouth daily.        . metoprolol tartrate (LOPRESSOR) 25 MG tablet Take 25 mg by mouth 2 (two) times daily.       . multivitamin (RENA-VIT) TABS tablet Take 1 tablet by mouth daily.      . multivitamin (THERAGRAN) per tablet Take 1 tablet by mouth daily.      . pantoprazole (PROTONIX) 40 MG tablet Take 40 mg by mouth daily.        . polyethylene glycol (MIRALAX / GLYCOLAX) packet Take 17 g by mouth daily.      . Rivaroxaban (XARELTO) 15 MG TABS tablet Take 1 tablet (15 mg total) by mouth daily.  30 tablet  0  . sennosides-docusate sodium (SENOKOT-S) 8.6-50 MG tablet Take 2 tablets by mouth at bedtime.        . solifenacin (VESICARE) 5 MG tablet Take 10 mg by mouth daily.        Marland Kitchen zolpidem (AMBIEN) 5 MG tablet Take 5 mg by mouth at bedtime as needed. For sleep.  Scheduled:     . cephALEXin  1,000 mg Oral Q12H  . darifenacin  7.5 mg Oral Daily  . ferrous sulfate  325 mg Oral Q breakfast  . insulin aspart  0-15 Units Subcutaneous TID WC  . insulin aspart  5 Units Subcutaneous TID WC  . insulin glargine  25 Units Subcutaneous QHS  . metoprolol tartrate  25 mg Oral BID  . multivitamin  1 tablet Oral Daily  . pantoprazole  40 mg Oral Daily  . protein supplement  6 g Oral TID WC  . rivaroxaban  15 mg Oral Daily  . rosuvastatin  20 mg Oral Daily  . DISCONTD: cefTRIAXone (ROCEPHIN)  IV  1 g Intravenous Q24H   Infusions:     . sodium chloride Kathryn mL/hr at 05/04/11 0434   PRN: acetaminophen, bisacodyl, diphenhydrAMINE, hydrALAZINE, HYDROcodone-acetaminophen, magic mouthwash, menthol-cetylpyridinium, morphine, ondansetron (ZOFRAN) IV, ondansetron, zolpidem  Assessment:  Kathryn YOF on Xarelto PTA for hx Afib.  Xarelto resumed yesterday.   On renally adjusted dose of Xarelto (15mg  daily for CrCl <68ml/min).  Renal function has been improving and CrCl is just above 99ml/min now.  Having hematuria again but Xarelto is to continue.  CBC has  been stable.  Goal of Therapy:  Appropriate renal dose of Xarelto   Plan:   Will continue with 15mg  of Xarelto daily as per home dose  Even though CrCl now >50, will stay with lower dose given recurrence of hematuria.   F/u daily.  Charolotte Eke, PharmD, pager [phone]. 05/04/2011,10:05 AM.  RN held Xarelto dose at 10am. RN to contact MD.  Charolotte Eke, PharmD, pager [phone]. 05/04/2011,11:40 AM.

## 2011-05-04 NOTE — Progress Notes (Signed)
Subjective: Still c/o flank and abd pain and worsened hematuria   Antibiotics:  Anti-infectives     Start     Dose/Rate Route Frequency Ordered Stop   05/03/11 2200   cephALEXin (KEFLEX) capsule 1,000 mg        1,000 mg Oral Every 12 hours 05/03/11 1620 05/05/11 2159   04/20/11 1400   cefTRIAXone (ROCEPHIN) 1 g in dextrose 5 % 50 mL IVPB  Status:  Discontinued        1 g 100 mL/hr over 30 Minutes Intravenous Every 24 hours 04/20/11 1337 05/03/11 1619   04/19/11 1200   imipenem-cilastatin (PRIMAXIN) 250 mg in sodium chloride 0.9 % 100 mL IVPB  Status:  Discontinued        250 mg 200 mL/hr over 30 Minutes Intravenous 4 times per day 04/19/11 1045 04/20/11 1337   04/18/11 2200   cefTRIAXone (ROCEPHIN) 1 g in dextrose 5 % 50 mL IVPB  Status:  Discontinued        1 g 100 mL/hr over 30 Minutes Intravenous Daily at bedtime 04/18/11 0020 04/19/11 0913   04/18/11 0000   cefTRIAXone (ROCEPHIN) 1 g in dextrose 5 % 50 mL IVPB  Status:  Discontinued        1 g 100 mL/hr over 30 Minutes Intravenous Every 24 hours 04/17/11 2217 04/18/11 0020   04/17/11 2015   cefTRIAXone (ROCEPHIN) 1 g in dextrose 5 % 50 mL IVPB        1 g 100 mL/hr over 30 Minutes Intravenous  Once 04/17/11 2002 04/17/11 2307          Medications: Scheduled Meds:    . cephALEXin  1,000 mg Oral Q12H  . darifenacin  7.5 mg Oral Daily  . ferrous sulfate  325 mg Oral Q breakfast  . insulin aspart  0-15 Units Subcutaneous TID WC  . insulin aspart  5 Units Subcutaneous TID WC  . insulin glargine  25 Units Subcutaneous QHS  . metoprolol tartrate  25 mg Oral BID  . multivitamin  1 tablet Oral Daily  . pantoprazole  40 mg Oral Daily  . protein supplement  6 g Oral TID WC  . rivaroxaban  15 mg Oral Daily  . rosuvastatin  20 mg Oral Daily  . DISCONTD: cefTRIAXone (ROCEPHIN)  IV  1 g Intravenous Q24H   Continuous Infusions:    . sodium chloride 75 mL/hr at 05/04/11 0434   PRN Meds:.acetaminophen, bisacodyl,  diphenhydrAMINE, hydrALAZINE, HYDROcodone-acetaminophen, magic mouthwash, menthol-cetylpyridinium, morphine, ondansetron (ZOFRAN) IV, ondansetron, zolpidem   Objective: Weight change:   Intake/Output Summary (Last 24 hours) at 05/04/11 1330 Last data filed at 05/04/11 0910  Gross per 24 hour  Intake   1305 ml  Output   3200 ml  Net  -1895 ml   Blood pressure 162/76, pulse 69, temperature 98.6 F (37 C), temperature source Oral, resp. rate 14, height 5\' 9"  (1.753 m), weight 233 lb 7.5 oz (105.9 kg), SpO2 95.00%. Temp:  [98.2 F (36.8 C)-98.6 F (37 C)] 98.6 F (37 C) (02/26 0550) Pulse Rate:  [66-92] 69  (02/26 0550) Resp:  [14-18] 14  (02/26 0550) BP: (150-162)/(76-81) 162/76 mmHg (02/26 0550) SpO2:  [95 %-97 %] 95 % (02/26 0550)  Physical Exam: General: Alert and awake, oriented x3, not in any acute distress. HEENT: anicteric sclera, pupils reactive to light and accommodation, EOMI CVS regular rate, normal r,  no murmur rubs or gallops Chest: clear to auscultation bilaterally, no wheezing, rales or rhonchi Abdomen:  tender ruq rlq greater than rest of abdomen, pos bwoel sounds Extremities: no  clubbing or edema noted bilaterally Skin: no rashes Lymph: no new lymphadenopathy Neuro: nonfocal  Lab Results:  Basename 05/04/11 0334 05/03/11 0325  WBC 5.1 5.2  HGB 8.1* 8.3*  HCT 25.4* 25.7*  PLT 315 348    BMET  Basename 05/04/11 0334 05/03/11 0325  NA 136 136  K 3.7 3.8  CL 109 110  CO2 21 20  GLUCOSE 74 109*  BUN 15 15  CREATININE 1.19* 1.23*  CALCIUM 8.6 8.4    Micro Results: Recent Results (from the past 240 hour(s))  SURGICAL PCR SCREEN     Status: Normal   Collection Time   04/28/11 10:25 AM      Component Value Range Status Comment   MRSA, PCR NEGATIVE  NEGATIVE  Final    Staphylococcus aureus NEGATIVE  NEGATIVE  Final   URINE CULTURE     Status: Normal   Collection Time   04/29/11  1:44 PM      Component Value Range Status Comment   Specimen  Description KIDNEY RIGHT   Final    Special Requests NONE   Final    Culture  Setup Time [id number]   Final    Colony Count NO GROWTH   Final    Culture NO GROWTH   Final    Report Status 05/01/2011 FINAL   Final     Studies/Results: No results found.    Assessment/Plan: Kathryn [name] is a 78 y.o. female with  kleb pneumonia complicated UTI with bacteremia, sp day 13 days of effeective antibiotics, now with ? Perforation of right ureter based on findings on CT scan. She had gas collection on prior CT scan that has since resolved and there was also consideration of pathology in her viscera given this finding. Her Cystoscopy found obstruction in right ureter and large cloudy bloody fluid was taken and cultured and without growth, now changed from rocephin to keflex to finish  7 days of antibiotics  Post procedure  My main concern is WHY DID she have this gas collection present on initial CT scan?? And if it did not represent ureteral injury OR GI pathology, then how did it arise?, I agree with repeat cystoscopy and would also consider repeat CTwith in the next month and low threshold to reimage in interim should her clinical condition change.  I will sign off for now. Please call with further questions.    LOS: 17 days   Acey Lav 05/04/2011, 1:30 PM

## 2011-05-04 NOTE — Progress Notes (Signed)
Physical Therapy Treatment Patient Details Name: Kathryn [name] MRN: [id number] DOB: [birth year] Today's Date: 05/04/2011  PT Assessment/Plan  PT - Assessment/Plan Comments on Treatment Session: Pt declined OOB this session for multiple reasons, but agreeable to bed exercises. Will continue to encourage pt to participate in OOB activity.  PT Plan: Discharge plan remains appropriate Follow Up Recommendations: Skilled nursing facility Equipment Recommended: Defer to next venue PT Goals  Acute Rehab PT Goals Pt will Perform Home Exercise Program: with supervision, verbal cues required/provided PT Goal: Perform Home Exercise Program - Progress: Goal set today  PT Treatment Precautions/Restrictions  Precautions Precautions: Fall Required Braces or Orthoses: No Restrictions Weight Bearing Restrictions: No Other Position/Activity Restrictions: pt lacks terminal extensionof both knees and needs to keep heels off bed. She may benefit from prevalon boot to provide pressure relief and eliminate pillow under knees. 05/04/11-elevated bil LEs and R UE on pillow(s) to aid in reducing edema.  Mobility (including Balance) Pt declined OOB activity   Exercise  General Exercises - Lower Extremity Ankle Circles/Pumps: AROM;15 reps;Supine;Both Short Arc Quad: AROM;Both;Other reps (comment);AAROM (15 reps) Heel Slides: Both;15 reps;Supine;AAROM Hip ABduction/ADduction: Both;15 reps;Supine;AAROM End of Session PT - End of Session Activity Tolerance: Patient limited by fatigue;Patient limited by pain Patient left: with call bell in reach General Behavior During Session: [facility] for tasks performed. Pt not very motivated to perform OOB activites.  Cognition: WFL for tasks performed  Kathryn [name] Kathryn [name] 05/04/2011, 1:53 PM [phone]

## 2011-05-04 NOTE — Progress Notes (Addendum)
Subjective: Already has return of hematuria after a single dose of Xarelto. Still have low mid spasms and sharp abdominal pain. No fever. Vomited again yesterday  Urine in bag looks like red Kool-Aid or jello color  Objective: Vital signs in last 24 hours: Temp:  [98.2 F (36.8 C)-98.6 F (37 C)] 98.6 F (37 C) (02/26 0550) Pulse Rate:  [66-92] 69  (02/26 0550) Resp:  [14-18] 14  (02/26 0550) BP: (150-162)/(76-81) 162/76 mmHg (02/26 0550) SpO2:  [95 %-97 %] 95 % (02/26 0550)  Intake/Output from previous day: 02/25 0701 - 02/26 0700 In: 1305 [P.O.:480; I.V.:825] Out: 2950 [Urine:2950] Intake/Output this shift:    General: alert, morbidly obese and pale, face symmtric, sclera anicteric, neck supple. Lungs clear. Ht IR/IR with murmur, abd large soft with diffuse mild lower tenderness without rebound. Hypoactive BS 2+ edema. Alert, awake, mentating well  Lab Results   [facility] 05/04/11 0334 05/03/11 0325  WBC 5.1 5.2  RBC 2.68* 2.72*  HGB 8.1* 8.3*  HCT 25.4* 25.7*  MCV 94.8 94.5  MCH 30.2 30.5  RDW 16.4* 16.3*  PLT 315 348    Basename 05/04/11 0334 05/03/11 0325  NA 136 136  K 3.7 3.8  CL 109 110  CO2 21 20  GLUCOSE 74 109*  BUN 15 15  CREATININE 1.19* 1.23*  CALCIUM 8.6 8.4    Studies/Results: No results found.  Scheduled Meds:   . cephALEXin  1,000 mg Oral Q12H  . darifenacin  7.5 mg Oral Daily  . ferrous sulfate  325 mg Oral Q breakfast  . insulin aspart  0-15 Units Subcutaneous TID WC  . insulin aspart  5 Units Subcutaneous TID WC  . insulin glargine  25 Units Subcutaneous QHS  . metoprolol tartrate  25 mg Oral BID  . multivitamin  1 tablet Oral Daily  . pantoprazole  40 mg Oral Daily  . protein supplement  6 g Oral TID WC  . rivaroxaban  15 mg Oral Daily  . rosuvastatin  20 mg Oral Daily  . DISCONTD: cefTRIAXone (ROCEPHIN)  IV  1 g Intravenous Q24H   Continuous Infusions:   . sodium chloride 75 mL/hr at 05/04/11 0434   PRN Meds:acetaminophen,  bisacodyl, diphenhydrAMINE, hydrALAZINE, HYDROcodone-acetaminophen, magic mouthwash, menthol-cetylpyridinium, morphine, ondansetron (ZOFRAN) IV, ondansetron, zolpidem  Assessment/Plan:  *Pyelonephritis, off rocephin, on Keflex. No fever but still has pain. Now has more hematuria. Will talk with Urology about earlier cysto. .  Nausea getting better Active Problems:  DM-stable , FBS 75 HYPERTENSION, UNSPECIFIED-stable.  Atrial fibrillation-on Xarelto again but already having hematuria Anemia-stable.  Renal insufficiency-stable., sl better today  Hyponatremia-remaining stable Obesity-follow.  Bacteremia due to Klebsiella pneumoniae-finished Rx   LOS: 17 days   Gabrianna Fassnacht [name] 05/04/2011, 7:42 AM

## 2011-05-05 LAB — COMPREHENSIVE METABOLIC PANEL
Alkaline Phosphatase: 179 U/L — ABNORMAL HIGH (ref 39–117)
BUN: 15 mg/dL (ref 6–23)
CO2: 23 mEq/L (ref 19–32)
Chloride: 112 mEq/L (ref 96–112)
Creatinine, Ser: 1.23 mg/dL — ABNORMAL HIGH (ref 0.50–1.10)
GFR calc Af Amer: 48 mL/min — ABNORMAL LOW (ref >90)
GFR calc non Af Amer: 42 mL/min — ABNORMAL LOW (ref >90)
Glucose, Bld: 92 mg/dL (ref 70–99)
Total Bilirubin: 0.1 mg/dL — ABNORMAL LOW (ref 0.3–1.2)

## 2011-05-05 LAB — CBC
HCT: 25.2 % — ABNORMAL LOW (ref 36.0–46.0)
Hemoglobin: 8.1 g/dL — ABNORMAL LOW (ref 12.0–15.0)
MCV: 95.1 fL (ref 78.0–100.0)
RBC: 2.65 MIL/uL — ABNORMAL LOW (ref 3.87–5.11)
RDW: 16.6 % — ABNORMAL HIGH (ref 11.5–15.5)
WBC: 6.2 10*3/uL (ref 4.0–10.5)

## 2011-05-05 LAB — GLUCOSE, CAPILLARY
Glucose-Capillary: 89 mg/dL (ref 70–99)
Glucose-Capillary: 136 mg/dL — ABNORMAL HIGH (ref 70–99)

## 2011-05-05 MED ORDER — GLUCOSE-VITAMIN C 4-6 GM-MG PO CHEW
CHEWABLE_TABLET | ORAL | Status: AC
  Administered 2011-05-05: 08:00:00
  Filled 2011-05-05: qty 1

## 2011-05-05 MED ORDER — INSULIN GLARGINE 100 UNIT/ML ~~LOC~~ SOLN
20.0000 [IU] | Freq: Every day | SUBCUTANEOUS | Status: DC
  Administered 2011-05-05: 20 [IU] via SUBCUTANEOUS

## 2011-05-05 MED ORDER — ONDANSETRON HCL 4 MG PO TABS
4.0000 mg | ORAL_TABLET | ORAL | Status: DC | PRN
  Administered 2011-05-05 – 2011-05-06 (×3): 4 mg via ORAL
  Filled 2011-05-05 (×3): qty 1

## 2011-05-05 NOTE — Progress Notes (Signed)
cbg 57 patient not symptomatic.  Gave tube of glucose.  CBG @ 0840 89.  MD notified.  Yellow protocol sticker filled out and placed in the progress note of the chart.

## 2011-05-05 NOTE — Progress Notes (Signed)
Patient ID: Kathryn [name], female   DOB: 03-Jun-1933, 78 y.o.   MRN: [id number] 6 Days Post-Op Subjective: Patient reports  Some ongoing nausea and right-sided abdominal/flank pain. Overall however she is better and had a reasonable night. Her urine is clear this morning.  Objective: Vital signs in last 24 hours: Temp:  [97.6 F (36.4 C)-98.9 F (37.2 C)] 97.6 F (36.4 C) (02/27 0408) Pulse Rate:  [59-79] 59  (02/27 0408) Resp:  [16-18] 18  (02/27 0408) BP: (154-170)/(79-88) 160/79 mmHg (02/27 0408) SpO2:  [95 %-96 %] 96 % (02/27 0408)  Intake/Output from previous day: 02/26 0701 - 02/27 0700 In: 240 [P.O.:240] Out: 2525 [Urine:2525] Intake/Output this shift: Total I/O In: -  Out: 1050 [Urine:1050]  Physical Exam:  Constitutional: Vital signs reviewed. WD WN in NAD   Eyes: PERRL, No scleral icterus.   Cardiovascular:Irreg/irreg Pulmonary/Chest: Normal effort Abdominal: Soft. Non-tender, non-distended, bowel sounds are normal, no masses, organomegaly, or guarding present.  Extremities: No cyanosis or edema   Lab Results:  Basename 05/05/11 0330 05/04/11 0334 05/03/11 0325  HGB 8.1* 8.1* 8.3*  HCT 25.2* 25.4* 25.7*   BMET  Basename 05/05/11 0330 05/04/11 0334  NA 139 136  K 3.8 3.7  CL 112 109  CO2 23 21  GLUCOSE 92 74  BUN 15 15  CREATININE 1.23* 1.19*  CALCIUM 8.4 8.6   No results found for this basename: LABPT:3,INR:3 in the last 72 hours No results found for this basename: LABURIN:1 in the last 72 hours Results for orders placed during the hospital encounter of 04/17/11  CULTURE, BLOOD (ROUTINE X 2)     Status: Normal   Collection Time   04/17/11  6:45 PM      Component Value Range Status Comment   Specimen Description BLOOD RIGHT HAND   Final    Special Requests BOTTLES DRAWN AEROBIC AND ANAEROBIC 5 CC EACH   Final    Culture  Setup Time [id number]   Final    Culture     Final    Value: KLEBSIELLA PNEUMONIAE     Note: Gram Stain Report Called to,Read  Back By and Verified With: [facility] MILLER 04/22/11 1535 BY SMITHERSJ   Report Status 04/24/2011 FINAL   Final    Organism ID, Bacteria KLEBSIELLA PNEUMONIAE   Final   URINE CULTURE     Status: Normal   Collection Time   04/17/11  7:31 PM      Component Value Range Status Comment   Specimen Description URINE, CATHETERIZED   Final    Special Requests NONE   Final    Culture  Setup Time [id number]   Final    Colony Count 85,000 COLONIES/ML   Final    Culture KLEBSIELLA PNEUMONIAE   Final    Report Status 04/19/2011 FINAL   Final    Organism ID, Bacteria KLEBSIELLA PNEUMONIAE   Final   CULTURE, BLOOD (ROUTINE X 2)     Status: Normal   Collection Time   04/17/11  9:50 PM      Component Value Range Status Comment   Specimen Description BLOOD LEFT ARM   Final    Special Requests BOTTLES DRAWN AEROBIC AND ANAEROBIC 5CC   Final    Culture  Setup Time [id number]   Final    Culture     Final    Value: KLEBSIELLA PNEUMONIAE     Note: CRITICAL RESULT CALLED TO, READ BACK BY AND VERIFIED WITH: DEBRA ANDERSON @ 840PM BY Payton Doughty 04/18/11  Report Status 04/20/2011 FINAL   Final    Organism ID, Bacteria KLEBSIELLA PNEUMONIAE   Final   MRSA PCR SCREENING     Status: Normal   Collection Time   04/18/11 12:17 AM      Component Value Range Status Comment   MRSA by PCR NEGATIVE  NEGATIVE  Final   CULTURE, BLOOD (ROUTINE X 2)     Status: Normal   Collection Time   04/19/11  9:50 AM      Component Value Range Status Comment   Specimen Description BLOOD LEFT HAND   Final    Special Requests BOTTLES DRAWN AEROBIC AND ANAEROBIC 10CC   Final    Culture  Setup Time [id number]   Final    Culture NO GROWTH 5 DAYS   Final    Report Status 04/25/2011 FINAL   Final   CULTURE, BLOOD (ROUTINE X 2)     Status: Normal   Collection Time   04/19/11 10:00 AM      Component Value Range Status Comment   Specimen Description BLOOD LEFT ARM   Final    Special Requests BOTTLES DRAWN AEROBIC AND ANAEROBIC 10CC   Final     Culture  Setup Time [id number]   Final    Culture NO GROWTH 5 DAYS   Final    Report Status 04/25/2011 FINAL   Final   SURGICAL PCR SCREEN     Status: Normal   Collection Time   04/28/11 10:25 AM      Component Value Range Status Comment   MRSA, PCR NEGATIVE  NEGATIVE  Final    Staphylococcus aureus NEGATIVE  NEGATIVE  Final   URINE CULTURE     Status: Normal   Collection Time   04/29/11  1:44 PM      Component Value Range Status Comment   Specimen Description KIDNEY RIGHT   Final    Special Requests NONE   Final    Culture  Setup Time [id number]   Final    Colony Count NO GROWTH   Final    Culture NO GROWTH   Final    Report Status 05/01/2011 FINAL   Final     Studies/Results: No results found.  Assessment/Plan:   There's been no major clinical change. She did have increased hematuria with reinitiation of anticoagulation which is now resolved with discontinuation of that. Her immobility does put her at increased risk for DVT and pulmonary embolus. She also has chronic A. fib. Compression boots maybe an alternative for DVT prophylaxis or one might want to consider some low-dose subcutaneous heparin. While her situation is certainly improved during the hospitalization she does continue to have lingering issues with abdominal pain and nausea. Some of this may be attributable to the stent but that is required at this point. I did not want to make another attempt at endoscopic assessment ureter until the stone has been indwelling for at least 3-4 weeks. If she could be transitioned to oral antibiotics and pain medicine she may be able to be transferred back to [facility]. The initial presentation with retroperitoneal gas again remains confusing. As I have stated before her spontaneous perforation of the ureter and the setting of no recent instrumentation or trauma would be exceedingly unlikely. In addition one would expect large urinoma in that case and a small amount of air. The initial  retroperitoneal air would certainly be much more consistent with some initial GI process.   LOS: 18 days   Ares Cardozo  S 05/05/2011, 8:37 AM

## 2011-05-05 NOTE — Progress Notes (Signed)
Patient nauseated and RN unable to repeat zofran.  Called MD and received order to increase frequency of zofran.  RN will repeat dose.

## 2011-05-05 NOTE — Progress Notes (Signed)
Subjective: Feels a little better. Had swelling of right hand, now better. No CP or SOB. Abd pain is better. Some nausea. Urine color is still red in the bag but clear in the tube   Objective: Vital signs in last 24 hours: Temp:  [97.6 F (36.4 C)-98.9 F (37.2 C)] 97.6 F (36.4 C) (02/27 0408) Pulse Rate:  [59-79] 59  (02/27 0408) Resp:  [16-18] 18  (02/27 0408) BP: (154-170)/(79-88) 160/79 mmHg (02/27 0408) SpO2:  [95 %-96 %] 96 % (02/27 0408)  Intake/Output from previous day: 02/26 0701 - 02/27 0700 In: 240 [P.O.:240] Out: 2525 [Urine:2525] Intake/Output this shift: Total I/O In: -  Out: 1050 [Urine:1050]  General: cooperative General: alert, morbidly obese and pale, face symmtric, sclera anicteric, neck supple. Lungs clear. Ht IR/IR with murmur, abd large soft with diffuse mild lower tenderness without rebound. Hypoactive BS  2+ edema. Alert, awake, mentating well   Lab Results   [facility] 05/05/11 0330 05/04/11 0334  WBC 6.2 5.1  RBC 2.65* 2.68*  HGB 8.1* 8.1*  HCT 25.2* 25.4*  MCV 95.1 94.8  MCH 30.6 30.2  RDW 16.6* 16.4*  PLT 301 315    Basename 05/05/11 0330 05/04/11 0334  NA 139 136  K 3.8 3.7  CL 112 109  CO2 23 21  GLUCOSE 92 74  BUN 15 15  CREATININE 1.23* 1.19*  CALCIUM 8.4 8.6    Studies/Results: No results found.  Scheduled Meds:   . cephALEXin  1,000 mg Oral Q12H  . darifenacin  7.5 mg Oral Daily  . ferrous sulfate  325 mg Oral Q breakfast  . glucose-Vitamin C      . insulin aspart  0-15 Units Subcutaneous TID WC  . insulin aspart  5 Units Subcutaneous TID WC  . insulin glargine  25 Units Subcutaneous QHS  . metoprolol tartrate  25 mg Oral BID  . multivitamin  1 tablet Oral Daily  . pantoprazole  40 mg Oral Daily  . protein supplement  6 g Oral TID WC  . rosuvastatin  20 mg Oral Daily  . DISCONTD: rivaroxaban  15 mg Oral Daily   Continuous Infusions:   . DISCONTD: sodium chloride 75 mL/hr at 05/04/11 0434   PRN  Meds:acetaminophen, bisacodyl, diphenhydrAMINE, hydrALAZINE, HYDROcodone-acetaminophen, magic mouthwash, menthol-cetylpyridinium, morphine, ondansetron (ZOFRAN) IV, ondansetron, zolpidem  Assessment/Plan:  *Pyelonephritis, off rocephin, on Keflex. No fever but still has pain. Less hematuria.   Nausea getting better  Active Problems:  DM-stable , FBS a bit low. Lantus reduced HYPERTENSION, UNSPECIFIED-stable.  Atrial fibrillation-on Xarelto again but already having hematuria  Anemia-stable.  Renal insufficiency-stable., sl better today  Hyponatremia-remaining stable  Obesity-follow.  Bacteremia due to Klebsiella pneumoniae-finished Rx    LOS: 18 days   Kathryn [name] 05/05/2011, 8:56 AM

## 2011-05-05 NOTE — Progress Notes (Signed)
CSW continues to follow. Pt has bed at [facility] when she is ready to d/c. CSW to follow.  Kathryn [name], [place] 05/05/2011 12:38 PM

## 2011-05-06 LAB — COMPREHENSIVE METABOLIC PANEL
ALT: 18 U/L (ref 0–35)
AST: 21 U/L (ref 0–37)
Albumin: 1.8 g/dL — ABNORMAL LOW (ref 3.5–5.2)
CO2: 22 mEq/L (ref 19–32)
Calcium: 8.6 mg/dL (ref 8.4–10.5)
Chloride: 108 mEq/L (ref 96–112)
Creatinine, Ser: 1.19 mg/dL — ABNORMAL HIGH (ref 0.50–1.10)
GFR calc non Af Amer: 44 mL/min — ABNORMAL LOW (ref >90)
Sodium: 136 mEq/L (ref 135–145)

## 2011-05-06 LAB — CBC
Platelets: 317 10*3/uL (ref 150–400)
RBC: 2.88 MIL/uL — ABNORMAL LOW (ref 3.87–5.11)
RDW: 16.4 % — ABNORMAL HIGH (ref 11.5–15.5)
WBC: 5.7 10*3/uL (ref 4.0–10.5)

## 2011-05-06 LAB — GLUCOSE, CAPILLARY
Glucose-Capillary: 143 mg/dL — ABNORMAL HIGH (ref 70–99)
Glucose-Capillary: 117 mg/dL — ABNORMAL HIGH (ref 70–99)

## 2011-05-06 MED ORDER — MORPHINE SULFATE 4 MG/ML IJ SOLN: 4.0000 mg | INTRAMUSCULAR | Status: DC | PRN

## 2011-05-06 MED ORDER — HEPARIN SODIUM (PORCINE) 5000 UNIT/ML IJ SOLN
5000.0000 [IU] | Freq: Three times a day (TID) | INTRAMUSCULAR | Status: DC
  Administered 2011-05-06: 5000 [IU] via SUBCUTANEOUS
  Filled 2011-05-06 (×7): qty 1

## 2011-05-06 MED ORDER — INSULIN GLARGINE 100 UNIT/ML ~~LOC~~ SOLN: 20.0000 [IU] | Freq: Every day | SUBCUTANEOUS | Status: DC

## 2011-05-06 MED ORDER — ONDANSETRON HCL 4 MG PO TABS: 4.0000 mg | ORAL_TABLET | ORAL | Status: AC | PRN

## 2011-05-06 MED ORDER — RESOURCE INSTANT PROTEIN PO PWD PACKET: 6.0000 g | Freq: Three times a day (TID) | ORAL | Status: DC

## 2011-05-06 MED ORDER — HEPARIN SODIUM (PORCINE) 5000 UNIT/ML IJ SOLN: 5000.0000 [IU] | Freq: Three times a day (TID) | INTRAMUSCULAR | Status: DC

## 2011-05-06 MED ORDER — INSULIN ASPART 100 UNIT/ML ~~LOC~~ SOLN: 0.0000 [IU] | Freq: Three times a day (TID) | SUBCUTANEOUS | Status: DC

## 2011-05-06 MED ORDER — ONDANSETRON HCL 4 MG/2ML IJ SOLN: 4.0000 mg | Freq: Four times a day (QID) | INTRAMUSCULAR | Status: DC | PRN

## 2011-05-06 MED ORDER — BISACODYL 10 MG RE SUPP: 10.0000 mg | Freq: Every day | RECTAL | Status: AC | PRN

## 2011-05-06 NOTE — Progress Notes (Signed)
D/c summary received by [facility].  Kathryn [name] at [facility] had questions regarding the revised d/c summary.  DON at [facility] spoke with Charge RN, Amy, regarding Pt's meds.  After this conversation, Kathryn [name] advised CSW that they were ready to receive Pt.  CSW arranged for ambulance transport.  Pt to be d/c'd.  Providence Crosby, LCSWA Clinical Social Work [phone]

## 2011-05-06 NOTE — Discharge Summary (Addendum)
DISCHARGE SUMMARY  Kathryn [name]  MR#: [id number]  DOB:1933-12-18  Date of Admission: 04/17/2011 Date of Discharge: 05/06/2011  Attending Physician:Raife Lizer [name]  Patient's WUJ:[id number] [name], MD, MD  Consults:Treatment Team:  Valetta Fuller, MD Infectious disease General surgery  Procedures: CT abdomen, renal ultrasound, and double-J stent placement. Discharge Diagnoses: Principal Problem:  *Pyelonephritis, acute Bilateral hydronephrosis with right greater than left Klebsiella bacteremia, now finished with antibiotics Ongoing abdominal pain felt secondary to urologic issues Constipation, improved Peripheral edema, fair Failure to thrive and immobility Active Problems:  DM type II requiring insulin Persistent hematuria  HYPERTENSION, UNSPECIFIED  Atrial fibrillation, , chronic, off specific anticoagulation for the present  Anemia  Renal insufficiency, now improving after acute worsening  Hyponatremia, resolved  Obesity  Bacteremia due to Klebsiella pneumoniae Hypertension, treated  hyperlipidemia, treated Chronic nausea with ongoing issues Macular degeneration with reduced vision 2 small adrenal adenomas Mild pleural effusions Protein calorie malnutrition Calcified coronary arteries Chronic edema   Discharge Medications: Medication List  As of 05/06/2011  7:57 AM   STOP taking these medications         acetaminophen 325 MG tablet      Rivaroxaban 15 MG Tabs tablet         TAKE these medications         atorvastatin 20 MG tablet   Commonly known as: LIPITOR   Take 20 mg by mouth daily.      bisacodyl 10 MG suppository   Commonly known as: DULCOLAX   Place 1 suppository (10 mg total) rectally daily as needed.      Calcium Carbonate-Vitamin D 600-400 MG-UNIT per tablet   Take 1 tablet by mouth 2 (two) times daily.      ferrous sulfate 325 (65 FE) MG tablet   Take 325 mg by mouth daily with breakfast.      heparin 5000 UNIT/ML injection   Inject 1 mL (5,000 Units total) into the skin every 8 (eight) hours.      HYDROcodone-acetaminophen 5-325 MG per tablet   Commonly known as: NORCO   Take 1 tablet by mouth every 6 (six) hours as needed.      insulin aspart 100 UNIT/ML injection   Commonly known as: novoLOG   Inject 0-15 Units into the skin 3 (three) times daily with meals.      insulin glargine 100 UNIT/ML injection   Commonly known as: LANTUS   Inject 20 Units into the skin at bedtime.      losartan 100 MG tablet   Commonly known as: COZAAR   Take 100 mg by mouth daily.      metoprolol tartrate 25 MG tablet   Commonly known as: LOPRESSOR   Take 25 mg by mouth 2 (two) times daily.      morphine 4 MG/ML injection   Inject 1 mL (4 mg total) into the vein every 2 (two) hours as needed for severe pain.      multivitamin per tablet   Take 1 tablet by mouth daily.      multivitamin Tabs tablet   Take 1 tablet by mouth daily.      ondansetron 4 MG/2ML Soln injection   Commonly known as: ZOFRAN   Inject 2 mLs (4 mg total) into the vein every 6 (six) hours as needed for nausea or vomiting.      pantoprazole 40 MG tablet   Commonly known as: PROTONIX   Take 40 mg by mouth daily.  polyethylene glycol packet   Commonly known as: MIRALAX / GLYCOLAX   Take 17 g by mouth daily.      protein supplement 6 g Powd   Commonly known as: RESOURCE BENEPROTEIN   Take 1 scoop (6 g total) by mouth 3 (three) times daily with meals.      sennosides-docusate sodium 8.6-50 MG tablet   Commonly known as: SENOKOT-S   Take 2 tablets by mouth at bedtime.      solifenacin 5 MG tablet   Commonly known as: VESICARE   Take 10 mg by mouth daily.      zolpidem 5 MG tablet   Commonly known as: AMBIEN   Take 5 mg by mouth at bedtime as needed. For sleep.            Hospital Procedures: Ct Abdomen Pelvis Wo Contrast  04/25/2011  *RADIOLOGY REPORT*  Clinical Data: Urinary tract infection.  Progressive leukocytosis despite  antibiotics.  Recently demonstrated small amount of gas adjacent to the mid right ureter with right greater than left hydronephrosis.  Clinical concern for developing abscess.  CT ABDOMEN AND PELVIS WITHOUT CONTRAST  Technique:  Multidetector CT imaging of the abdomen and pelvis was performed following the standard protocol without intravenous contrast.  Comparison: 04/21/2011.  Findings: Mildly progressive dilatation of the right renal collecting system., currently moderately dilated.  Mild dilatation of the left renal collecting system with mild progression.  The left ureter is mildly dilated to the level of the urinary bladder with no obstructing calculus seen.  There is a Foley catheter in the bladder with no urine seen in the bladder.  The proximal right ureter is moderately dilated to the level of the mid ureter in the upper pelvis, where the previously demonstrated small amount of gas was seen.  Currently, no gas is seen in that region.  However, there has been interval development of an oval area of mildly increased density in that area, measuring 2.6 x 2.1 cm in maximum dimensions on image number 55.  This measures 63 HU in density on image.  There is minimal progression of adjacent extraluminal fluid.  This includes a fluid collection on the right measuring 5.9 x 3.1 cm in maximum dimensions on image number 48, previously measuring 5.6 x 2.4 cm in maximum dimensions.  The appendix is not visualized and is surgically absent by history. No gastrointestinal abnormalities are seen.  No enlarged lymph nodes.  Atheromatous arterial calcifications, including intrasplenic arteries.  Unremarkable liver.  Mild nodularity of the left adrenal gland measuring near fluid density, compatible with two small adenomas.  Unremarkable right adrenal gland, uterus and left ovary.  The right ovary is mildly enlarged for the age of the patient, measuring 4.2 x 2.4 cm in maximum dimensions on image number 62.  Small bilateral  pleural effusions, greater on the left, both increased.  Bilateral lower lobe atelectasis.  Lumbar lower thoracic spine degenerative changes.  IMPRESSION:  1.  Moderate right hydroureteronephrosis to the level of the mid right ureter in the upper pelvis.  There is interval resolution of extraluminal gas in that region with an interval probable small hematoma or abscess and mildly progressive extraureteral fluid. These findings are concerning for obstruction of the mid right ureter with perforation of the ureter with possible associated infection and developing abscess.  2.  Mild left hydroureteronephrosis to the level of the urinary bladder with mild progression. 3.  Interval suggestion of mild enlargement of the right ovary. This appearance was not  previously present.  Therefore, this is most likely representative of increased fluid in this region, rather than true ovarian enlargement. 4.  Status post appendectomy. 5.  Mildly progressive bilateral pleural fluid with associated mild bilateral lower lobe atelectasis.  Original Report Authenticated By: Darrol Angel, M.D.   Ct Abdomen Pelvis Wo Contrast  04/21/2011  *RADIOLOGY REPORT*  Clinical Data: Evaluate for retroperitoneal gas.  History of abdominal pain and hematuria.  CT ABDOMEN AND PELVIS WITHOUT CONTRAST  Technique:  Multidetector CT imaging of the abdomen and pelvis was performed following the standard protocol without intravenous contrast.  Comparison: CT abdomen 04/19/2011  Findings: The upper abdomen was imaged twice due to excessive patient motion.  Lung bases are clear with exception of small pleural effusions.  Again noted is a small amount of gas adjacent to the mid right ureter.  The amount gas has slightly decreased since the prior examination.  There is mild-moderate right hydroureteronephrosis with soft tissue edema surrounding the mid right ureter and similar to the prior examination. Slightly increased distention of the right renal pelvis  compared to the prior examination.  There is a Foley catheter within the urinary bladder.  Again noted is mild dilatation of the left renal collecting system.  There is slightly decreased distention of the left renal collecting system compared to the prior examination.  Stable appearance of the uterus and adnexal structures.  The right adnexal structures are in the same region as the edematous changes around the right ureter.  Extensive edema in the presacral region. There is diffuse mesenteric edema and perirenal edema.  No evidence for kidney stones.  Limited evaluation of the intra-abdominal solid organs due to the lack of intravenous contrast.  No gross abnormalities in the liver, spleen or pancreatic tissue.  Stable fullness of the adrenal tissue. Coronary arteries are heavily calcified.  Diffuse degenerative bone changes.  IMPRESSION: There is persistent dilatation of the renal collecting systems bilaterally.  There is now slightly increased dilatation of the right compared to the left.  No evidence for obstructing stones.  There is persistent gas in the region of the right mid ureter, but the etiology of the air remains indeterminate.   The edema and inflammatory changes centered around the the right ureter is concerning for an inflammatory or infectious etiology.  Small pleural effusions.  Edematous changes throughout the abdomen and pelvis.  Original Report Authenticated By: Richarda Overlie, M.D.   Ct Abdomen Pelvis Wo Contrast  04/19/2011  *RADIOLOGY REPORT*  Clinical Data:  Nausea.  Vomiting.  Fatigue.  Hydronephrosis. Question stone.  Prior cholecystectomy.  CT ABDOMEN AND PELVIS WITHOUT CONTRAST (CT UROGRAM)  Technique: Contiguous axial images of the abdomen and pelvis without oral or intravenous contrast were obtained.  Comparison: Ultrasound of 04/19/2011 and 10/11/2010.  Prior CT of 02/13/2010.  Findings:  Exam is limited for evaluation of entities other than urinary tract calculi due to lack of oral or  intravenous contrast.   Motion degraded evaluation of the lung bases/inferior chest. Cardiomegaly with advanced multivessel coronary artery atherosclerosis.  Trace bilateral pleural effusions.  Possible calcified lesion in the right breast, incompletely imaged on image 1 of series 2.  Minimal exclusion of the liver.  Normal spleen, stomach, pancreas. Cholecystectomy.  No biliary ductal dilatation.  Normal right adrenal gland.  Mild left adrenal thickening without well-defined mass.  Bilateral moderate perirenal edema.  Bilateral renal cortical thinning.  Moderate left-sided hydroureter throughout.  Mild right- sided hydroureter.  No evidence of ureteric stone.  Both ureters are followed to the level of the bladder, which is not distended.  No retroperitoneal or retrocrural adenopathy.  Colonic diverticulosis.  Normal terminal ileum.  Small bowel loops are normal in caliber.  There is a lower abdominal upper pelvic extraperitoneal/retroperitoneal edema which is nonspecific.  Foci of gas are seen along the course of the right ureter, including on images 55 - 58 of series 2. These areas are poorly evaluated, secondary to lack of contrast.  The appendix is not identified, and by report is surgically absent.  No pelvic adenopathy.  The bladder is collapsed around a Foley catheter.  Normal uterus, without adnexal mass or significant free pelvic fluid.  Diffuse anasarca. No acute osseous abnormality.  IMPRESSION:  1.  Left greater than right hydroureter.  No evidence of obstructive stone.  This is followed to the level of the bladder, which is collapsed around a Foley catheter.  Cannot exclude debris, hemorrhage, or less likely neoplasm within the bladder causing hydroureter. Cystoscopy or repeat imaging after removal of Foley suggested. 2.  Degraded exam, secondary patient size and lack of oral / IV contrast. 3.  Diffuse retroperitoneal edema with air along the course of the right ureter. Adjacent bowel loops are poorly  evaluated secondary to lack of enteric contrast ( i.e. this air could be within bowel loops).  Especially if there are abdominal symptoms to suggest extraluminal bowel air or ureteric injury, further evaluation with contrast enhanced (preferably oral and IV) CT should be considered. The study was made a "call report." 4.  Small bilateral pleural effusions. 5.  Possible calcified lesion within the right breast, indeterminate and incompletely imaged.  Original Report Authenticated By: Consuello Bossier, M.D.   US Renal  04/19/2011  *RADIOLOGY REPORT*  Clinical Data: Worsening renal function and urinary tract infection.  RENAL/URINARY TRACT ULTRASOUND COMPLETE  Comparison:  Renal ultrasound 10/11/2010 and abdominal CT 02/13/2010  Findings:  Right Kidney:  Right kidney measures 11.1 cm in length without hydronephrosis. Normal appearance of the right renal parenchyma. There is mild fullness of the right renal pelvis which could represent an extrarenal pelvis.  Left Kidney:  Left kidney measures 12.4 cm in length with fullness of the left renal collecting system and left renal pelvis. Findings are compatible with hydronephrosis and increased since the exam on 10/11/2010.  Bladder:  There is a Foley catheter within the urinary bladder and the bladder appears to be decompressed.  IMPRESSION: Moderate left hydronephrosis.  The degree of left hydronephrosis has increased since the exam on 10/11/2010.  A ureteral obstruction cannot be excluded and the patient may benefit from a CT of the abdomen and pelvis for further characterization.  Original Report Authenticated By: Richarda Overlie, M.D.   Dg Pneumonia Chest 2v  04/17/2011  *RADIOLOGY REPORT*  Clinical Data: Fever.  Rule out pneumonia  CHEST - 2 VIEW  Comparison: 02/17/2011  Findings: Negative for pneumonia.  Mild atelectasis in the right base.  Negative for heart failure or effusion.  IMPRESSION:   Mild right lower lobe atelectasis, negative for pneumonia.  Original Report  Authenticated By: Camelia Phenes, M.D.    History of Present Illness: Presented with fever and abdominal pain and nausea  Hospital Course: Been long and complicated hospitalization for Ms. Vise. She presented with nausea abdominal pain and had bacteremia with Klebsiella. She never had true sepsis but was medically quite unwell and is now received 2 solid weeks of intravenous antibiotics. Her urologic issues I remained difficult and at the  forefront. Initially she had some mild hydronephrosis and worsening renal insufficiency. A Foley decompression did help this somewhat than her situation worsened. It was felt to be possibly some air outside of the ureter. Initially there was also a concern this could be related to the gastrointestinal tract. She was seen and evaluated the weekend before last by general surgery it was not felt to be related to the gastrointestinal tract. She then underwent double-J stent as by urology. On the right side there was a partial obstruction iand what could of been hematoma. The possibility of obstruction still exists. Her creatinine has been relatively stable. She does however have daily abdominal pain that gets better and worse at times. She has daily nausea doesn't eat terribly well but has lost a lot of weight. She has some constipation issues which have resolved. Her blood sugar were doing better now been a bit low in last couple days. She was getting 5 units of supplemental fast acting insulin each meal and this is now being discontinued. We also reduced her Lantus. Her blood pressures been variable but reasonable. She basically bedridden. We have a problem of chronic atrial fibrillation that we can't adequately anticoagulate due to this hematuria that persists. We gave her a single dose of xarelto and she had frank hematuria less than 24 hours. Given that her A. fib is chronic, her risk of embolic disease is a bit less. We going to try using subcutaneous heparin primarily  for DVT prophylaxis will be nice to get her back on Xarelto at some point in the future. She is basically immobile and doesn't out of bed much. She's had some mild depression while here. She had no fever in several days. Her pulmonary status as been okay. She has some ongoing anemia which needs to be watched. She did have some hyponatremia which is resolved. Urology plans to do more procedures in one to 2 more weeks. Hopefully we can resolve these issues at that time. Fairly close check on her labs with regard to her renal status and hemoglobin is recommended. Her diet is no concentrated sweets. We've been checking her sugars before meals and at bedtime. She is nonambulatory but perhaps physical therapy to help strengthen her. She has no swallowing issues. Her CODE STATUS has been full code. Day of Discharge Exam BP 130/66  Pulse 57  Temp(Src) 99.1 F (37.3 C) (Oral)  Resp 18  Ht 5\' 9"  (1.753 m)  Wt 105.9 kg (233 lb 7.5 oz)  BMI 34.48 kg/m2  SpO2 95%  Physical Exam: General appearance: alert, chronically unwell but in no distress. Face is symmetric. Eyes: no scleral icterus Throat: oropharynx moist without erythema Resp: clear to auscultation bilaterally, no wheezes rales or rhonchi. Cardio: irregularly irregular rhythm, with a systolic murmur GI: Obese with no specific tenderness and no rebound   bowel sounds normal; no masses,  no organomegaly Extremities: 2+ edema of lower extremities, very shiny skin with venous stasis changes Neuro: Awake alert and mentating fairly well. Perhaps some dysphoria.  Discharge Labs:  [facility] 05/06/11 0344 05/05/11 0330  NA 136 139  K 3.8 3.8  CL 108 112  CO2 22 23  GLUCOSE 95 92  BUN 13 15  CREATININE 1.19* 1.23*  CALCIUM 8.6 8.4  MG -- --  PHOS -- --    Basename 05/06/11 0344 05/05/11 0330  AST 21 20  ALT 18 19  ALKPHOS 178* 179*  BILITOT 0.2* 0.1*  PROT 5.2* 4.9*  ALBUMIN 1.8* 1.6*  Basename 05/06/11 0344 05/05/11 0330  WBC 5.7 6.2   NEUTROABS -- --  HGB 8.7* 8.1*  HCT 27.4* 25.2*  MCV 95.1 95.1  PLT 317 301   Lab Results  Component Value Date   INR 1.10* 02/05/2011   INR 1.14 11/15/2010   INR 1.88* 10/17/2010   PROTIME 13.2 02/05/2011   PROTIME 19.7 08/09/2008     Discharge instructions: Discharge Orders    Future Appointments: Provider: Department: Dept Phone: Center:   05/21/2011 3:00 PM Stephanie Acre [facility] [phone] None   05/21/2011 3:30 PM [facility] [phone] None   06/11/2011 3:15 PM Stephanie Acre [facility] [phone] None   06/11/2011 3:45 PM [facility] [phone] None   07/02/2011 2:30 PM Krista Blue [facility] [phone] None   07/02/2011 3:00 PM Eli Hose, MD [facility] [phone] None   07/02/2011 3:30 PM [facility] [phone] None      Disposition: To skilled nursing facility  Follow-up Appts: Follow-up with Dr. Evlyn Kanner at [facility] if discharge from [facility].  Call for appointment.  Condition on Discharge: Improved  Tests Needing Follow-up: None DUE TO FACILITY UNWILLINGNESS TO ALLOW IV MEDS FOR PAIN AND NAUSEA, MORPHINE AND ZOFRAN HAVE BEEN DISCONTINUED. THE HOUSE MD WILL HAVE TO ADDRESS PAIN MEDS. MY THOUGHT WOULD BE TO CONSIDER PO ROXINAL AND PO ZOFRAN Signed: Tayton Decaire [name] 05/06/2011, 7:57 AM

## 2011-05-06 NOTE — Progress Notes (Signed)
Per CM, Linda, Pt ready to d/c.  Notified Human resources officer at [facility].  Jasmine December requesting d/c summary.  Faxed d/c summary.  Jasmine December reviewed D/c summary and stated that IV Zofran and IV Morphine cannot be accommodated and asking if they can be changed to PO.  Notified Care Coordinator, Selena Batten, who notified MD.  MD to revise d/c summary.  Notified Pt of d/c today.  Pt already aware and declined to have CSW notify family, as she's already spoken to her son.  CSW to continue to follow.  Providence Crosby, LCSWA Clinical Social Work [phone]

## 2011-05-06 NOTE — Progress Notes (Signed)
Faxed updated d/c summary to [facility] at [facility].  CSW to continue to follow.  Providence Crosby, LCSWA Clinical Social Work [phone]

## 2011-05-07 LAB — GLUCOSE, CAPILLARY

## 2011-05-10 ENCOUNTER — Encounter (HOSPITAL_COMMUNITY): Payer: Self-pay | Admitting: Urology

## 2011-05-12 ENCOUNTER — Other Ambulatory Visit: Payer: Self-pay | Admitting: Urology

## 2011-05-15 ENCOUNTER — Inpatient Hospital Stay (HOSPITAL_COMMUNITY)
Admission: EM | Admit: 2011-05-15 | Discharge: 2011-05-19 | DRG: 690 | Disposition: A | Discharge status: Skilled Nursing Facility | Payer: Medicare Other | Attending: Internal Medicine | Admitting: Internal Medicine

## 2011-05-15 ENCOUNTER — Emergency Department (HOSPITAL_COMMUNITY): Payer: Medicare Other

## 2011-05-15 ENCOUNTER — Encounter (HOSPITAL_COMMUNITY): Payer: Self-pay

## 2011-05-15 ENCOUNTER — Other Ambulatory Visit: Payer: Self-pay

## 2011-05-15 DIAGNOSIS — R401 Stupor: Secondary | ICD-10-CM | POA: Diagnosis present

## 2011-05-15 DIAGNOSIS — H3530 Unspecified macular degeneration: Secondary | ICD-10-CM | POA: Diagnosis present

## 2011-05-15 DIAGNOSIS — N1330 Unspecified hydronephrosis: Secondary | ICD-10-CM | POA: Diagnosis present

## 2011-05-15 DIAGNOSIS — B961 Klebsiella pneumoniae [K. pneumoniae] as the cause of diseases classified elsewhere: Secondary | ICD-10-CM | POA: Diagnosis present

## 2011-05-15 DIAGNOSIS — E669 Obesity, unspecified: Secondary | ICD-10-CM | POA: Diagnosis present

## 2011-05-15 DIAGNOSIS — R4182 Altered mental status, unspecified: Secondary | ICD-10-CM | POA: Diagnosis present

## 2011-05-15 DIAGNOSIS — J189 Pneumonia, unspecified organism: Secondary | ICD-10-CM

## 2011-05-15 DIAGNOSIS — Z96659 Presence of unspecified artificial knee joint: Secondary | ICD-10-CM

## 2011-05-15 DIAGNOSIS — I4891 Unspecified atrial fibrillation: Secondary | ICD-10-CM | POA: Diagnosis present

## 2011-05-15 DIAGNOSIS — N390 Urinary tract infection, site not specified: Principal | ICD-10-CM | POA: Diagnosis present

## 2011-05-15 DIAGNOSIS — M810 Age-related osteoporosis without current pathological fracture: Secondary | ICD-10-CM | POA: Diagnosis present

## 2011-05-15 DIAGNOSIS — I10 Essential (primary) hypertension: Secondary | ICD-10-CM | POA: Diagnosis present

## 2011-05-15 DIAGNOSIS — E876 Hypokalemia: Secondary | ICD-10-CM | POA: Diagnosis present

## 2011-05-15 DIAGNOSIS — I498 Other specified cardiac arrhythmias: Secondary | ICD-10-CM | POA: Diagnosis present

## 2011-05-15 DIAGNOSIS — E46 Unspecified protein-calorie malnutrition: Secondary | ICD-10-CM | POA: Diagnosis present

## 2011-05-15 DIAGNOSIS — Z6839 Body mass index (BMI) 39.0-39.9, adult: Secondary | ICD-10-CM

## 2011-05-15 DIAGNOSIS — J811 Chronic pulmonary edema: Secondary | ICD-10-CM

## 2011-05-15 DIAGNOSIS — E785 Hyperlipidemia, unspecified: Secondary | ICD-10-CM | POA: Diagnosis present

## 2011-05-15 DIAGNOSIS — R627 Adult failure to thrive: Secondary | ICD-10-CM | POA: Diagnosis present

## 2011-05-15 DIAGNOSIS — J90 Pleural effusion, not elsewhere classified: Secondary | ICD-10-CM | POA: Diagnosis present

## 2011-05-15 DIAGNOSIS — D649 Anemia, unspecified: Secondary | ICD-10-CM | POA: Diagnosis present

## 2011-05-15 DIAGNOSIS — I509 Heart failure, unspecified: Secondary | ICD-10-CM | POA: Diagnosis present

## 2011-05-15 DIAGNOSIS — L98499 Non-pressure chronic ulcer of skin of other sites with unspecified severity: Secondary | ICD-10-CM

## 2011-05-15 HISTORY — DX: Pressure ulcer of unspecified part of back, unspecified stage: L89.109

## 2011-05-15 LAB — URINALYSIS, ROUTINE W REFLEX MICROSCOPIC
Ketones, ur: NEGATIVE mg/dL
Nitrite: NEGATIVE
Protein, ur: 100 mg/dL — AB
Urobilinogen, UA: 1 mg/dL (ref 0.0–1.0)

## 2011-05-15 LAB — DIFFERENTIAL
Eosinophils Relative: 1 % (ref 0–5)
Lymphocytes Relative: 5 % — ABNORMAL LOW (ref 12–46)
Monocytes Absolute: 0.3 10*3/uL (ref 0.1–1.0)
Monocytes Relative: 4 % (ref 3–12)
Neutro Abs: 6.9 10*3/uL (ref 1.7–7.7)

## 2011-05-15 LAB — COMPREHENSIVE METABOLIC PANEL
BUN: 14 mg/dL (ref 6–23)
CO2: 32 mEq/L (ref 19–32)
Chloride: 100 mEq/L (ref 96–112)
Creatinine, Ser: 1.13 mg/dL — ABNORMAL HIGH (ref 0.50–1.10)
GFR calc Af Amer: 53 mL/min — ABNORMAL LOW (ref >90)
GFR calc non Af Amer: 46 mL/min — ABNORMAL LOW (ref >90)
Total Bilirubin: 0.5 mg/dL (ref 0.3–1.2)

## 2011-05-15 LAB — CBC
HCT: 29.5 % — ABNORMAL LOW (ref 36.0–46.0)
Hemoglobin: 9.5 g/dL — ABNORMAL LOW (ref 12.0–15.0)
MCHC: 32.2 g/dL (ref 30.0–36.0)
MCV: 95.8 fL (ref 78.0–100.0)
WBC: 7.6 10*3/uL (ref 4.0–10.5)

## 2011-05-15 LAB — LACTIC ACID, PLASMA: Lactic Acid, Venous: 2 mmol/L (ref 0.5–2.2)

## 2011-05-15 LAB — URINE MICROSCOPIC-ADD ON

## 2011-05-15 LAB — MRSA PCR SCREENING: MRSA by PCR: NEGATIVE

## 2011-05-15 LAB — LIPASE, BLOOD: Lipase: 21 U/L (ref 11–59)

## 2011-05-15 MED ORDER — FUROSEMIDE 10 MG/ML IJ SOLN
60.0000 mg | INTRAMUSCULAR | Status: AC
  Administered 2011-05-15: 60 mg via INTRAVENOUS
  Filled 2011-05-15 (×2): qty 6

## 2011-05-15 MED ORDER — GENTAMICIN IN SALINE 1.6-0.9 MG/ML-% IV SOLN
80.0000 mg | INTRAVENOUS | Status: DC
  Filled 2011-05-15: qty 50

## 2011-05-15 MED ORDER — LORAZEPAM 2 MG/ML IJ SOLN
INTRAMUSCULAR | Status: AC
  Administered 2011-05-15: 2 mg
  Filled 2011-05-15: qty 1

## 2011-05-15 MED ORDER — VANCOMYCIN HCL 1000 MG IV SOLR
1500.0000 mg | INTRAVENOUS | Status: DC
  Administered 2011-05-16 – 2011-05-17 (×2): 1500 mg via INTRAVENOUS
  Filled 2011-05-15 (×3): qty 1500

## 2011-05-15 MED ORDER — PIPERACILLIN-TAZOBACTAM 3.375 G IVPB 30 MIN: 3.3750 g | Freq: Four times a day (QID) | INTRAVENOUS | Status: DC

## 2011-05-15 MED ORDER — PIPERACILLIN-TAZOBACTAM 4.5 G IVPB
4.5000 g | Freq: Once | INTRAVENOUS | Status: AC
  Administered 2011-05-15: 4.5 g via INTRAVENOUS
  Filled 2011-05-15: qty 100

## 2011-05-15 MED ORDER — VANCOMYCIN HCL 1000 MG IV SOLR
2000.0000 mg | Freq: Once | INTRAVENOUS | Status: AC
  Administered 2011-05-15: 2000 mg via INTRAVENOUS
  Filled 2011-05-15: qty 2000

## 2011-05-15 MED ORDER — FUROSEMIDE 10 MG/ML IJ SOLN
40.0000 mg | Freq: Every day | INTRAMUSCULAR | Status: DC
  Administered 2011-05-16 – 2011-05-19 (×4): 40 mg via INTRAVENOUS
  Filled 2011-05-15 (×5): qty 4

## 2011-05-15 MED ORDER — PIPERACILLIN-TAZOBACTAM 3.375 G IVPB
3.3750 g | Freq: Three times a day (TID) | INTRAVENOUS | Status: DC
  Administered 2011-05-15 – 2011-05-19 (×11): 3.375 g via INTRAVENOUS
  Filled 2011-05-15 (×13): qty 50

## 2011-05-15 MED ORDER — HEPARIN SODIUM (PORCINE) 5000 UNIT/ML IJ SOLN
5000.0000 [IU] | Freq: Three times a day (TID) | INTRAMUSCULAR | Status: DC
  Administered 2011-05-15 – 2011-05-19 (×11): 5000 [IU] via SUBCUTANEOUS
  Filled 2011-05-15 (×16): qty 1

## 2011-05-15 MED ORDER — SODIUM CHLORIDE 0.9 % IV SOLN
INTRAVENOUS | Status: DC
  Administered 2011-05-17: 17:00:00 via INTRAVENOUS

## 2011-05-15 NOTE — ED Provider Notes (Signed)
History     CSN: [id number]  Arrival date & time 05/15/11  1338   First MD Initiated Contact with Patient 05/15/11 1502      No chief complaint on file.   (Consider location/radiation/quality/duration/timing/severity/associated sxs/prior treatment) HPI  78yoF history of insulin-dependent diabetes, hypertension, atrial fibrillation recently diagnosed with urinary tract infection on antibiotics presents with mental status. According to the family was at the bedside now the patient experienced altered mental status today at the nursing home. She was transported to the emergency department for further workup and evaluation. A level V caveat applies for altered mental status. Apparently while she was in the emergency department with her family she tightened both upper extremities and contracted. They state that her eyes rolled into the back of her head. They felt like this is a seizure episode. It lasted for approximately 30 seconds. She was given Ativan by another EDP provider prior to my examination  ED Notes, ED Provider Notes from 05/15/11 0000 to 05/15/11 14:19:13       Lucia Estelle, RN 05/15/2011 14:07      Son now at bedside, pt is somewhat communicating with the son. Pt told son that she is in pain everywhere. Son reports pt was diagnosis with pyelonephritis 1 week ago. Per son, pt has increasing Alter mental status just today, sts she was normal yesterday.         Lucia Estelle, RN 05/15/2011 13:42      Per ems pt from [facility], ems called out for [facility] started yesterday but worsen today. Pt currently on antibiotic for UTI. Pt is alert, on her side due to pressure ulcers on her back    Past Medical History  Diagnosis Date  . Osteoporosis   . DM (diabetes mellitus)   . HTN (hypertension)     unspecified  . Atrial fibrillation   . Macular degeneration   . Osteoarthritis   . Edema   . Anemia   . Hydronephrosis     left  . UTI (lower urinary tract infection)   . Pressure  ulcer of back     Past Surgical History  Procedure Date  . Replacement total knee 08/2007    bilateral  . Appendectomy   . Cholecystectomy   . Colonoscopy   . Upper gastrointestinal endoscopy   . Cystoscopy w/ ureteral stent placement 04/29/2011    Procedure: CYSTOSCOPY WITH RETROGRADE PYELOGRAM/URETERAL STENT PLACEMENT;  Surgeon: Valetta Fuller, MD;  Location: [facility];  Service: Urology;  Laterality: Bilateral;  bilateral retrograde, double j stent. Cystogram   . Ureteroscopy 04/29/2011    Procedure: URETEROSCOPY;  Surgeon: Valetta Fuller, MD;  Location: [facility];  Service: Urology;  Laterality: Right;    Family History  Problem Relation Age of Onset  . Diabetes Father   . Atrial fibrillation Father   . Breast cancer Paternal Grandmother   . Heart disease Mother     History  Substance Use Topics  . Smoking status: Never Smoker   . Smokeless tobacco: Never Used  . Alcohol Use: No    OB History    Grav Para Term Preterm Abortions TAB SAB Ect Mult Living                  Review of Systems  All other systems reviewed and are negative.   except as noted HPI  Allergies  Celebrex; Nsaids; and Sulfa antibiotics  Home Medications   Current Outpatient Rx  Name Route Sig Dispense  Refill  . ATORVASTATIN CALCIUM 20 MG PO TABS Oral Take 20 mg by mouth daily.      Marland Kitchen BISACODYL 10 MG RE SUPP Rectal Place 1 suppository (10 mg total) rectally daily as needed. 12 suppository 0  . CALCIUM CARBONATE-VITAMIN D 600-400 MG-UNIT PO TABS Oral Take 1 tablet by mouth 2 (two) times daily.      Marland Kitchen CLONIDINE HCL 0.1 MG PO TABS Oral Take 0.1 mg by mouth as needed. If BP > 160    . DIPHENHYDRAMINE HCL 25 MG PO CAPS Oral Take 25 mg by mouth 2 (two) times daily as needed. For itching.    Marland Kitchen FERROUS SULFATE 325 (65 FE) MG PO TABS Oral Take 325 mg by mouth daily with breakfast.      . HEPARIN SODIUM (PORCINE) 5000 UNIT/ML IJ SOLN Subcutaneous Inject 1 mL (5,000 Units total) into the skin every 8 (eight)  hours. 1 mL 3  . HYDROCODONE-ACETAMINOPHEN 5-325 MG PO TABS Oral Take 1 tablet by mouth every 6 (six) hours as needed. For pain.    . INSULIN ASPART 100 UNIT/ML [place] SOLN Subcutaneous Inject 0-15 Units into the skin 3 (three) times daily with meals. 1 vial 3  . INSULIN GLARGINE 100 UNIT/ML [place] SOLN Subcutaneous Inject 20 Units into the skin at bedtime. 10 mL 3  . LOSARTAN POTASSIUM 100 MG PO TABS Oral Take 100 mg by mouth daily.      Marland Kitchen METOPROLOL TARTRATE 50 MG PO TABS Oral Take 50 mg by mouth 2 (two) times daily.    . ADULT MULTIVITAMIN W/MINERALS CH Oral Take 1 tablet by mouth daily.    Marland Kitchen ONDANSETRON HCL 4 MG PO TABS Oral Take 4 mg by mouth every 8 (eight) hours as needed.    Marland Kitchen OVER THE COUNTER MEDICATION Oral Take 120 mLs by mouth 4 (four) times daily. Medpass Sugar free nutritional supplement.    Marland Kitchen PANTOPRAZOLE SODIUM 40 MG PO TBEC Oral Take 40 mg by mouth daily.     Marland Kitchen POLYETHYLENE GLYCOL 3350 PO PACK Oral Take 17 g by mouth daily.    . RESOURCE INSTANT PROTEIN PO PWD PACKET Oral Take 1 scoop (6 g total) by mouth 3 (three) times daily with meals. 30 packet 1  . SACCHAROMYCES BOULARDII 250 MG PO CAPS Oral Take 250 mg by mouth 2 (two) times daily.    . SENNA-DOCUSATE SODIUM 8.6-50 MG PO TABS Oral Take 2 tablets by mouth at bedtime.      . IMIPENEM-CILASTATIN IV 500 MG (MINIBAG PLUS) Intravenous Inject 500 mg into the vein every 6 (six) hours. 7 day course of therapy, started 05/13/11    . SOLIFENACIN SUCCINATE 10 MG PO TABS Oral Take 10 mg by mouth daily.    Marland Kitchen ZOLPIDEM TARTRATE 5 MG PO TABS Oral Take 5 mg by mouth at bedtime as needed. For sleep.      BP 172/80  Pulse 88  Temp(Src) 98.3 F (36.8 C) (Oral)  Resp 20  Ht 5\' 10"  (1.778 m)  Wt 230 lb (104.327 kg)  BMI 33.00 kg/m2  SpO2 95%  Physical Exam  Nursing note and vitals reviewed. Constitutional: She appears well-developed.  HENT:  Head: Atraumatic.  Mouth/Throat: Oropharynx is clear and moist.  Eyes: Conjunctivae are normal.  Pupils are equal, round, and reactive to light.  Neck: Neck supple.  Cardiovascular: Normal heart sounds and intact distal pulses.        Bradycardic, irregular rhythm  Pulmonary/Chest: Effort normal. No respiratory distress. She  has no wheezes. She has no rales.       CTAB anteriorly  Abdominal: Soft. She exhibits distension. There is no tenderness. There is no rebound and no guarding.  Musculoskeletal: Normal range of motion. She exhibits edema.       B/l LE pitting edema  Neurological:       Lethargic. Responsive to touch but will not obey commands  Skin: Skin is warm and dry. No rash noted. There is pallor.       Multiple bruises scattered UE b/l  Psychiatric: She has a normal mood and affect.    Date: 05/15/2011  Rate: 61  Rhythm: atrial fibrillation  QRS Axis: normal  Intervals: normal  ST/T Wave abnormalities: normal  Conduction Disutrbances:none  Narrative Interpretation:   Old EKG Reviewed: no significant change noted    ED Course  Procedures (including critical care time)  Labs Reviewed  GLUCOSE, CAPILLARY - Abnormal; Notable for the following:    Glucose-Capillary 184 (*)    All other components within normal limits  CBC - Abnormal; Notable for the following:    RBC 3.08 (*)    Hemoglobin 9.5 (*)    HCT 29.5 (*)    RDW 17.3 (*)    All other components within normal limits  DIFFERENTIAL - Abnormal; Notable for the following:    Neutrophils Relative 90 (*)    Lymphocytes Relative 5 (*)    Lymphs Abs 0.4 (*)    All other components within normal limits  COMPREHENSIVE METABOLIC PANEL - Abnormal; Notable for the following:    Glucose, Bld 194 (*)    Creatinine, Ser 1.13 (*)    Total Protein 5.5 (*)    Albumin 2.0 (*)    AST 41 (*) SLIGHT HEMOLYSIS   GFR calc non Af Amer 46 (*)    GFR calc Af Amer 53 (*)    All other components within normal limits  URINALYSIS, ROUTINE W REFLEX MICROSCOPIC - Abnormal; Notable for the following:    Color, Urine AMBER (*)  BIOCHEMICALS MAY BE AFFECTED BY COLOR   APPearance TURBID (*)    Hgb urine dipstick LARGE (*)    Bilirubin Urine SMALL (*)    Protein, ur 100 (*)    Leukocytes, UA LARGE (*)    All other components within normal limits  URINE MICROSCOPIC-ADD ON - Abnormal; Notable for the following:    Bacteria, UA FEW (*)    All other components within normal limits  LIPASE, BLOOD  LACTIC ACID, PLASMA  URINE CULTURE  CULTURE, BLOOD (ROUTINE X 2)  CULTURE, BLOOD (ROUTINE X 2)  CREATININE, SERUM   Ct Head Wo Contrast  05/15/2011  *RADIOLOGY REPORT*  Clinical Data: Altered mental status  CT HEAD WITHOUT CONTRAST  Technique:  Contiguous axial images were obtained from the base of the skull through the vertex without contrast.  Comparison: 01/27/2011  Findings: The brain shows generalized atrophy.  There are chronic small vessel changes within the deep white matter.  No sign of acute infarction, mass lesion, hemorrhage, hydrocephalus or extra- axial collection.  The calvarium is unremarkable.  Sinuses are clear.  IMPRESSION: Atrophy and chronic small vessel disease of the hemispheric white matter.  No acute or focal finding.  Original Report Authenticated By: Thomasenia Sales, M.D.   Dg Chest Portable 1 View  05/15/2011  *RADIOLOGY REPORT*  Clinical Data: Altered mental status.  Hypertension and asthma.  PORTABLE CHEST - 1 VIEW  Comparison: 04/17/2011  Findings: Artifact overlies chest.  The heart is enlarged.  There is pulmonary edema.  There is also volume loss in the left lower lobe.  There may be pleural fluid on the left.  No significant bony finding.  IMPRESSION: Probable congestive heart failure with pulmonary edema.  Volume loss in the left lower lobe that may relate to a left effusion. Left lower lobe pneumonia is not excluded.  Original Report Authenticated By: Thomasenia Sales, M.D.    1. HCAP (healthcare-associated pneumonia)   2. Pulmonary edema   3. UTI (lower urinary tract infection)   4. Skin ulcer       MDM  Patient presents with altered mental status. Differential diagnoses broad. Apparently had an episode of possible seizure-like activity and emergency department. CT head is unremarkable. Her labs are unremarkable except for UA which is consistent with infection. X-rays consistent with pulmonary edema and no money cannot be excluded. On arrival to the emergency department she was given vancomycin and Zosyn. Lasix ordered. Plan for [facility] to [facility] for further w/u and evaluation. Discussed with her son/mPOA- patient is FULL CODE.  Pulmonary edema, Cannot exclude pna. Lasix, abx. Discussed admission with [facility] Dr. Timothy Lasso who has accepted the patient and will evaluate in the Emergency Department         Forbes Cellar, MD 05/16/11 2150

## 2011-05-15 NOTE — ED Notes (Signed)
When in room communicating with family members, pt started to seize, 2 mg ativan given. Verbal ordered readback and verified by Dr. Denton Lank

## 2011-05-15 NOTE — ED Notes (Signed)
Per ems pt from [facility], ems called out for [facility] started yesterday but worsen today. Pt currently on antibiotic for UTI. Pt is alert, on her side due to pressure ulcers on her back

## 2011-05-15 NOTE — ED Notes (Signed)
Son now at bedside, pt is somewhat communicating with the son. Pt told son that she is in pain everywhere. Son reports pt was diagnosis with pyelonephritis 1 week ago. Per son, pt has increasing Alter mental status just today, sts she was normal yesterday.

## 2011-05-15 NOTE — H&P (Signed)
Kathryn [name] is an 78 y.o. female.   PCP:   Julian Hy, MD, MD   Chief Complaint:  Obtunded post Ativan AMS prior  HPI: 20yoF c PMH of IDDM, HTN, atrial fibrillation recently admitted 04/17/11 with Ab pain and Nausea and Fever.  Dxed with Pyelonephritis/urinary tract infection/Hematuria/Urinary Obstruction and Hydronephrosis.  She had Hydronephrosis and had a Foley placed.  She then underwent double-J stenting per URO.  She had issues with Afib and could not be anticoagulated due to the hematuria.  She was D/Ced on 2/28th to [facility] on IV antibiotics=Imipenim who presents today with altered mental status.  She was transported to the emergency department for further workup and evaluation.  According to ED documentation in the emergency department with her family she tightened both upper extremities and contracted, with eyes rolling into back of head. They felt like this may have been a seizure episode. It lasted for approximately 30 seconds. She was given Ativan by an EDP provider.  The event was unwitnesed by medical staff.  Apparently she was confused but conversant prior to the ativan.  Obtunded after.  She currently responds to sternal rubs and forced openings to eyes.  She grunts to some ?s then falls back to sleep.  She is HD stable and seems able to protect airway. Prior to "event" she claimed that she hurt all over. W/Up in ED was c/w Vol overload? And possible PNA despite nml labs.  Lasix, Zosyn and vanco were given for possible Sepsis.  No fever.  I was called to sort this out and get her admitted. During my exam there was no family present in the room. CCT was (-).  Urine was dirty.   Past Medical History:  Past Medical History  Diagnosis Date  . Osteoporosis   . DM (diabetes mellitus)   . HTN (hypertension)     unspecified  . Atrial fibrillation   . Macular degeneration   . Osteoarthritis   . Edema   . Anemia   . Hydronephrosis     left  . UTI (lower urinary  tract infection)   . Pressure ulcer of back     Past Surgical History  Procedure Date  . Replacement total knee 08/2007    bilateral  . Appendectomy   . Cholecystectomy   . Colonoscopy   . Upper gastrointestinal endoscopy   . Cystoscopy w/ ureteral stent placement 04/29/2011    Procedure: CYSTOSCOPY WITH RETROGRADE PYELOGRAM/URETERAL STENT PLACEMENT;  Surgeon: Valetta Fuller, MD;  Location: [facility]S;  Service: Urology;  Laterality: Bilateral;  bilateral retrograde, double j stent. Cystogram   . Ureteroscopy 04/29/2011    Procedure: URETEROSCOPY;  Surgeon: Valetta Fuller, MD;  Location: [facility];  Service: Urology;  Laterality: Right;      Allergies:   Allergies  Allergen Reactions  . Celebrex (Celecoxib)   . Nsaids   . Sulfa Antibiotics      Medications: Prior to Admission medications   Medication Sig Start Date End Date Taking? Authorizing Provider  atorvastatin (LIPITOR) 20 MG tablet Take 20 mg by mouth daily.     Yes Historical Provider, MD  bisacodyl (DULCOLAX) 10 MG suppository Place 1 suppository (10 mg total) rectally daily as needed. 05/06/11 05/16/11 Yes Julian Hy, MD  Calcium Carbonate-Vitamin D 600-400 MG-UNIT per tablet Take 1 tablet by mouth 2 (two) times daily.     Yes Historical Provider, MD  cloNIDine (CATAPRES) 0.1 MG tablet Take 0.1 mg by mouth as needed.  If BP > 160   Yes Historical Provider, MD  diphenhydrAMINE (BENADRYL) 25 mg capsule Take 25 mg by mouth 2 (two) times daily as needed. For itching.   Yes Historical Provider, MD  ferrous sulfate 325 (65 FE) MG tablet Take 325 mg by mouth daily with breakfast.     Yes Historical Provider, MD  heparin 5000 UNIT/ML injection Inject 1 mL (5,000 Units total) into the skin every 8 (eight) hours. 05/06/11  Yes Julian Hy, MD  HYDROcodone-acetaminophen [facility]) 5-325 MG per tablet Take 1 tablet by mouth every 6 (six) hours as needed. For pain.   Yes Historical Provider, MD  insulin aspart (NOVOLOG) 100  UNIT/ML injection Inject 0-15 Units into the skin 3 (three) times daily with meals. 05/06/11 05/05/12 Yes Julian Hy, MD  insulin glargine (LANTUS) 100 UNIT/ML injection Inject 20 Units into the skin at bedtime. 05/06/11 05/05/12 Yes Julian Hy, MD  losartan (COZAAR) 100 MG tablet Take 100 mg by mouth daily.     Yes Historical Provider, MD  metoprolol (LOPRESSOR) 50 MG tablet Take 50 mg by mouth 2 (two) times daily.   Yes Historical Provider, MD  Multiple Vitamin (MULITIVITAMIN WITH MINERALS) TABS Take 1 tablet by mouth daily.   Yes Historical Provider, MD  ondansetron (ZOFRAN) 4 MG tablet Take 4 mg by mouth every 8 (eight) hours as needed.   Yes Historical Provider, MD  OVER THE COUNTER MEDICATION Take 120 mLs by mouth 4 (four) times daily. Medpass Sugar free nutritional supplement.   Yes Historical Provider, MD  pantoprazole (PROTONIX) 40 MG tablet Take 40 mg by mouth daily.    Yes Historical Provider, MD  polyethylene glycol (MIRALAX / GLYCOLAX) packet Take 17 g by mouth daily.   Yes Historical Provider, MD  protein supplement (RESOURCE BENEPROTEIN) 6 g POWD Take 1 scoop (6 g total) by mouth 3 (three) times daily with meals. 05/06/11  Yes Julian Hy, MD  saccharomyces boulardii (FLORASTOR) 250 MG capsule Take 250 mg by mouth 2 (two) times daily.   Yes Historical Provider, MD  sennosides-docusate sodium (SENOKOT-S) 8.6-50 MG tablet Take 2 tablets by mouth at bedtime.     Yes Historical Provider, MD  sodium chloride 0.9 % SOLN 100 mL with imipenem-cilastatin 500 MG SOLR 500 mg Inject 500 mg into the vein every 6 (six) hours. 7 day course of therapy, started 05/13/11   Yes Historical Provider, MD  solifenacin (VESICARE) 10 MG tablet Take 10 mg by mouth daily.   Yes Historical Provider, MD  zolpidem (AMBIEN) 5 MG tablet Take 5 mg by mouth at bedtime as needed. For sleep.   Yes Historical Provider, MD     Medications Prior to Admission  Medication Dose Route Frequency Provider Last  Rate Last Dose  . furosemide (LASIX) injection 60 mg  60 mg Intravenous To ER Forbes Cellar, MD      . LORazepam (ATIVAN) 2 MG/ML injection        2 mg at 05/15/11 1420  . piperacillin-tazobactam (ZOSYN) IVPB 4.5 g  4.5 g Intravenous Once Forbes Cellar, MD   4.5 g at 05/15/11 1617  . vancomycin (VANCOCIN) 1,500 mg in sodium chloride 0.9 % 500 mL IVPB  1,500 mg Intravenous Q24H Amanda M Runyon, PHARMD      . vancomycin (VANCOCIN) 2,000 mg in sodium chloride 0.9 % 500 mL IVPB  2,000 mg Intravenous Once Teressa Lower, PHARMD   2,000 mg at 05/15/11 1749   Medications Prior to Admission  Medication Sig Dispense Refill  . atorvastatin (LIPITOR) 20 MG tablet Take 20 mg by mouth daily.        . bisacodyl (DULCOLAX) 10 MG suppository Place 1 suppository (10 mg total) rectally daily as needed.  12 suppository  0  . Calcium Carbonate-Vitamin D 600-400 MG-UNIT per tablet Take 1 tablet by mouth 2 (two) times daily.        . ferrous sulfate 325 (65 FE) MG tablet Take 325 mg by mouth daily with breakfast.        . heparin 5000 UNIT/ML injection Inject 1 mL (5,000 Units total) into the skin every 8 (eight) hours.  1 mL  3  . HYDROcodone-acetaminophen (NORCO) 5-325 MG per tablet Take 1 tablet by mouth every 6 (six) hours as needed. For pain.      Marland Kitchen insulin aspart (NOVOLOG) 100 UNIT/ML injection Inject 0-15 Units into the skin 3 (three) times daily with meals.  1 vial  3  . insulin glargine (LANTUS) 100 UNIT/ML injection Inject 20 Units into the skin at bedtime.  10 mL  3  . losartan (COZAAR) 100 MG tablet Take 100 mg by mouth daily.        . pantoprazole (PROTONIX) 40 MG tablet Take 40 mg by mouth daily.       . polyethylene glycol (MIRALAX / GLYCOLAX) packet Take 17 g by mouth daily.      . protein supplement (RESOURCE BENEPROTEIN) 6 g POWD Take 1 scoop (6 g total) by mouth 3 (three) times daily with meals.  30 packet  1  . sennosides-docusate sodium (SENOKOT-S) 8.6-50 MG tablet Take 2 tablets by mouth at  bedtime.        Marland Kitchen zolpidem (AMBIEN) 5 MG tablet Take 5 mg by mouth at bedtime as needed. For sleep.         Social History:  reports that she has never smoked. She has never used smokeless tobacco. She reports that she does not drink alcohol or use illicit drugs.  Family History: Family History  Problem Relation Age of Onset  . Diabetes Father   . Atrial fibrillation Father   . Breast cancer Paternal Grandmother   . Heart disease Mother     Review of Systems:  Review of Systems - Pt obtunded and unable to give   Physical Exam:  Blood pressure 124/99, pulse 64, temperature 98.3 F (36.8 C), temperature source Oral, resp. rate 20, height 5\' 10"  (1.778 m), weight 104.327 kg (230 lb), SpO2 97.00%. Filed Vitals:   05/15/11 1346 05/15/11 1537 05/15/11 1755  BP: 172/80  124/99  Pulse: 88  64  Temp: 98.3 F (36.8 C)    TempSrc: Oral    Resp: 20  20  Height:  5\' 10"  (1.778 m)   Weight:  104.327 kg (230 lb)   SpO2: 95%  97%   General appearance: Obtunded asleep.  All Vitals Nml.  Responds to sternal rub and pressure from palpation and stethascope.  OBESE Head: Normocephalic, without obvious abnormality, atraumatic Eyes: conjunctivae/corneas clear. PERRL Nose: Nares normal. Septum midline. Mucosa normal. No drainage or sinus tenderness. Throat: lips, mucosa, and tongue normal; teeth and gums normal Neck: no adenopathy, no carotid bruit, no JVD and thyroid not enlarged, symmetric, no tenderness/mass/nodules Resp: CTAB Anteriorly Cardio: Bradycardic, irregular rhythm  GI: soft, non-tender; bowel sounds normal; no masses,  no organomegaly Foley Catheter in place Extremities: 2+ edemaPulses: 2+ and symmetric. Bruises noted. Lymph nodes: no cervical lymphadenopathy Neurologic: Lethargic. Responsive to touch  but will not obey commands       Labs on Admission:   [facility] 05/15/11 1530  NA 138  K 4.3  CL 100  CO2 32  GLUCOSE 194*  BUN 14  CREATININE 1.13*  CALCIUM 8.9  MG  --  PHOS --    Basename 05/15/11 1530  AST 41*  ALT 15  ALKPHOS 109  BILITOT 0.5  PROT 5.5*  ALBUMIN 2.0*    Basename 05/15/11 1530  LIPASE 21  AMYLASE --    Basename 05/15/11 1530  WBC 7.6  NEUTROABS 6.9  HGB 9.5*  HCT 29.5*  MCV 95.8  PLT 218   No results found for this basename: CKTOTAL:3,CKMB:3,CKMBINDEX:3,TROPONINI:3 in the last 72 hours Lab Results  Component Value Date   INR 1.10* 02/05/2011   INR 1.14 11/15/2010   INR 1.88* 10/17/2010   PROTIME 13.2 02/05/2011   PROTIME 19.7 08/09/2008     LAB RESULT POCT:  Results for orders placed during the hospital encounter of 05/15/11  GLUCOSE, CAPILLARY      Component Value Range   Glucose-Capillary 184 (*) 70 - 99 (mg/dL)   Comment 1 Notify RN     Comment 2 Documented in Chart    CBC      Component Value Range   WBC 7.6  4.0 - 10.5 (K/uL)   RBC 3.08 (*) 3.87 - 5.11 (MIL/uL)   Hemoglobin 9.5 (*) 12.0 - 15.0 (g/dL)   HCT 71.0 (*) 62.6 - 46.0 (%)   MCV 95.8  78.0 - 100.0 (fL)   MCH 30.8  26.0 - 34.0 (pg)   MCHC 32.2  30.0 - 36.0 (g/dL)   RDW 94.8 (*) 54.6 - 15.5 (%)   Platelets 218  150 - 400 (K/uL)  DIFFERENTIAL      Component Value Range   Neutrophils Relative 90 (*) 43 - 77 (%)   Neutro Abs 6.9  1.7 - 7.7 (K/uL)   Lymphocytes Relative 5 (*) 12 - 46 (%)   Lymphs Abs 0.4 (*) 0.7 - 4.0 (K/uL)   Monocytes Relative 4  3 - 12 (%)   Monocytes Absolute 0.3  0.1 - 1.0 (K/uL)   Eosinophils Relative 1  0 - 5 (%)   Eosinophils Absolute 0.0  0.0 - 0.7 (K/uL)   Basophils Relative 0  0 - 1 (%)   Basophils Absolute 0.0  0.0 - 0.1 (K/uL)  COMPREHENSIVE METABOLIC PANEL      Component Value Range   Sodium 138  135 - 145 (mEq/L)   Potassium 4.3  3.5 - 5.1 (mEq/L)   Chloride 100  96 - 112 (mEq/L)   CO2 32  19 - 32 (mEq/L)   Glucose, Bld 194 (*) 70 - 99 (mg/dL)   BUN 14  6 - 23 (mg/dL)   Creatinine, Ser 2.70 (*) 0.50 - 1.10 (mg/dL)   Calcium 8.9  8.4 - 35.0 (mg/dL)   Total Protein 5.5 (*) 6.0 - 8.3 (g/dL)   Albumin  2.0 (*) 3.5 - 5.2 (g/dL)   AST 41 (*) 0 - 37 (U/L)   ALT 15  0 - 35 (U/L)   Alkaline Phosphatase 109  39 - 117 (U/L)   Total Bilirubin 0.5  0.3 - 1.2 (mg/dL)   GFR calc non Af Amer 46 (*) >90 (mL/min)   GFR calc Af Amer 53 (*) >90 (mL/min)  LIPASE, BLOOD      Component Value Range   Lipase 21  11 - 59 (U/L)  LACTIC ACID, PLASMA  Component Value Range   Lactic Acid, Venous 2.0  0.5 - 2.2 (mmol/L)  URINALYSIS, ROUTINE W REFLEX MICROSCOPIC      Component Value Range   Color, Urine AMBER (*) YELLOW    APPearance TURBID (*) CLEAR    Specific Gravity, Urine 1.019  1.005 - 1.030    pH 6.5  5.0 - 8.0    Glucose, UA NEGATIVE  NEGATIVE (mg/dL)   Hgb urine dipstick LARGE (*) NEGATIVE    Bilirubin Urine SMALL (*) NEGATIVE    Ketones, ur NEGATIVE  NEGATIVE (mg/dL)   Protein, ur 161 (*) NEGATIVE (mg/dL)   Urobilinogen, UA 1.0  0.0 - 1.0 (mg/dL)   Nitrite NEGATIVE  NEGATIVE    Leukocytes, UA LARGE (*) NEGATIVE   URINE MICROSCOPIC-ADD ON      Component Value Range   WBC, UA TOO NUMEROUS TO COUNT  <3 (WBC/hpf)   RBC / HPF 11-20  <3 (RBC/hpf)   Bacteria, UA FEW (*) RARE    Urine-Other MICROSCOPIC EXAM PERFORMED ON UNCONCENTRATED URINE        Radiological Exams on Admission: Ct Head Wo Contrast  05/15/2011  *RADIOLOGY REPORT*  Clinical Data: Altered mental status  CT HEAD WITHOUT CONTRAST  Technique:  Contiguous axial images were obtained from the base of the skull through the vertex without contrast.  Comparison: 01/27/2011  Findings: The brain shows generalized atrophy.  There are chronic small vessel changes within the deep white matter.  No sign of acute infarction, mass lesion, hemorrhage, hydrocephalus or extra- axial collection.  The calvarium is unremarkable.  Sinuses are clear.  IMPRESSION: Atrophy and chronic small vessel disease of the hemispheric white matter.  No acute or focal finding.  Original Report Authenticated By: Thomasenia Sales, M.D.   Dg Chest Portable 1  View  05/15/2011  *RADIOLOGY REPORT*  Clinical Data: Altered mental status.  Hypertension and asthma.  PORTABLE CHEST - 1 VIEW  Comparison: 04/17/2011  Findings: Artifact overlies chest.  The heart is enlarged.  There is pulmonary edema.  There is also volume loss in the left lower lobe.  There may be pleural fluid on the left.  No significant bony finding.  IMPRESSION: Probable congestive heart failure with pulmonary edema.  Volume loss in the left lower lobe that may relate to a left effusion. Left lower lobe pneumonia is not excluded.  Original Report Authenticated By: Thomasenia Sales, M.D.      Orders placed in visit on 05/15/11  . EKG 12-LEAD   AFib Rate 61  Assessment/Plan Principal Problem:  *Obtunded Active Problems:  DM  HYPERTENSION, UNSPECIFIED  Atrial fibrillation  Anemia  Obesity  UTI (lower urinary tract infection)  Altered mental state  Obtunded/AMS - Worsened post Ativan - Will let ativan wear off on its own in light that there is a ? Of Sz and Flumazenil would therefor be contraindicated.  NPO.  Watch in stepdown.  Notify CCM.  Will verify that Cxs were obtained. Recent Klebsiella Pyelonephritis/Hematuria/ Bilateral hydronephrosis with right greater than left S/P double-J stent placement/ Nausea Recurrent UTI -  ABX.  Consult Uro when she is more stabilized. ? Sz - Check EEG.  CCT (-).  Consider Neuro w/up ? HCAqPNA - Continue Abx Hold oral meds until she is awake.  Constipation Failure to thrive and immobility  DM type II requiring insulin  Persistent hematuria  HYPERTENSION, UNSPECIFIED  Atrial fibrillation, , chronic, off anticoagulation Anemia - Hbg currently 9.5 Renal insufficiency -  Hyponatremia - last Hospitalization -  Now 138. Obesity  hyperlipidemia Macular degeneration with reduced vision  2 small adrenal adenomas  Protein calorie malnutrition  Calcified coronary arteries  Chronic Peripheral edema/X-rays consistent with pulmonary edema - getting  some diuresis. Full Code.   Areej Tayler M 05/15/2011, 6:49 PM

## 2011-05-15 NOTE — Progress Notes (Signed)
ANTIBIOTIC CONSULT NOTE - INITIAL  Pharmacy Consult for Vancomycin Indication: rule out sepsis  Allergies  Allergen Reactions  . Celebrex (Celecoxib)   . Nsaids   . Sulfa Antibiotics     Patient Measurements: Height: 5\' 10"  (177.8 cm) Weight: 230 lb (104.327 kg) IBW/kg (Calculated) : 68.5   Vital Signs: Temp: 98.3 F (36.8 C) (03/09 1346) Temp src: Oral (03/09 1346) BP: 172/80 mmHg (03/09 1346) Pulse Rate: 88  (03/09 1346)  Labs: No results found for this basename: WBC:3,HGB:3,PLT:3,LABCREA:3,CREATININE:3 in the last 72 hours Estimated Creatinine Clearance: 52.6 ml/min (by C-G formula based on Cr of 1.19). No results found for this basename: VANCOTROUGH:2,VANCOPEAK:2,VANCORANDOM:2,GENTTROUGH:2,GENTPEAK:2,GENTRANDOM:2,TOBRATROUGH:2,TOBRAPEAK:2,TOBRARND:2,AMIKACINPEAK:2,AMIKACINTROU:2,AMIKACIN:2, in the last 72 hours   Microbiology: Recent Results (from the past 720 hour(s))  CULTURE, BLOOD (ROUTINE X 2)     Status: Normal   Collection Time   04/17/11  6:45 PM      Component Value Range Status Comment   Specimen Description BLOOD RIGHT HAND   Final    Special Requests BOTTLES DRAWN AEROBIC AND ANAEROBIC 5 CC EACH   Final    Culture  Setup Time [id number]   Final    Culture     Final    Value: KLEBSIELLA PNEUMONIAE     Note: Gram Stain Report Called to,Read Back By and Verified With: [facility] MILLER 04/22/11 1535 BY SMITHERSJ   Report Status 04/24/2011 FINAL   Final    Organism ID, Bacteria KLEBSIELLA PNEUMONIAE   Final   URINE CULTURE     Status: Normal   Collection Time   04/17/11  7:31 PM      Component Value Range Status Comment   Specimen Description URINE, CATHETERIZED   Final    Special Requests NONE   Final    Culture  Setup Time [id number]   Final    Colony Count 85,000 COLONIES/ML   Final    Culture KLEBSIELLA PNEUMONIAE   Final    Report Status 04/19/2011 FINAL   Final    Organism ID, Bacteria KLEBSIELLA PNEUMONIAE   Final   CULTURE, BLOOD (ROUTINE X 2)      Status: Normal   Collection Time   04/17/11  9:50 PM      Component Value Range Status Comment   Specimen Description BLOOD LEFT ARM   Final    Special Requests BOTTLES DRAWN AEROBIC AND ANAEROBIC 5CC   Final    Culture  Setup Time [id number]   Final    Culture     Final    Value: KLEBSIELLA PNEUMONIAE     Note: CRITICAL RESULT CALLED TO, READ BACK BY AND VERIFIED WITH: Bretta Bang @ 840PM BY VINCJ 04/18/11   Report Status 04/20/2011 FINAL   Final    Organism ID, Bacteria KLEBSIELLA PNEUMONIAE   Final   MRSA PCR SCREENING     Status: Normal   Collection Time   04/18/11 12:17 AM      Component Value Range Status Comment   MRSA by PCR NEGATIVE  NEGATIVE  Final   CULTURE, BLOOD (ROUTINE X 2)     Status: Normal   Collection Time   04/19/11  9:50 AM      Component Value Range Status Comment   Specimen Description BLOOD LEFT HAND   Final    Special Requests BOTTLES DRAWN AEROBIC AND ANAEROBIC 10CC   Final    Culture  Setup Time [id number]   Final    Culture NO GROWTH 5 DAYS   Final  Report Status 04/25/2011 FINAL   Final   CULTURE, BLOOD (ROUTINE X 2)     Status: Normal   Collection Time   04/19/11 10:00 AM      Component Value Range Status Comment   Specimen Description BLOOD LEFT ARM   Final    Special Requests BOTTLES DRAWN AEROBIC AND ANAEROBIC 10CC   Final    Culture  Setup Time [id number]   Final    Culture NO GROWTH 5 DAYS   Final    Report Status 04/25/2011 FINAL   Final   SURGICAL PCR SCREEN     Status: Normal   Collection Time   04/28/11 10:25 AM      Component Value Range Status Comment   MRSA, PCR NEGATIVE  NEGATIVE  Final    Staphylococcus aureus NEGATIVE  NEGATIVE  Final   URINE CULTURE     Status: Normal   Collection Time   04/29/11  1:44 PM      Component Value Range Status Comment   Specimen Description KIDNEY RIGHT   Final    Special Requests NONE   Final    Culture  Setup Time [id number]   Final    Colony Count NO GROWTH   Final    Culture NO GROWTH    Final    Report Status 05/01/2011 FINAL   Final     Medical History: Past Medical History  Diagnosis Date  . Osteoporosis   . DM (diabetes mellitus)   . HTN (hypertension)     unspecified  . Atrial fibrillation   . Macular degeneration   . Osteoarthritis   . Edema   . Anemia   . Hydronephrosis     left  . UTI (lower urinary tract infection)   . Pressure ulcer of back     Assessment: 78yo obese female presenting with s/sx's of sepsis to begin vancomycin per pharmacy.  Patient received 1x dose of zosyn in ER (no further doses ordered).   SCr not yet available for this admission, but baseline is ~1.2, CrCl(N)~45 mL/min.  Goal of Therapy:  Vancomycin trough level 15-20 mcg/ml  Plan:  Vancomycin 2g IV loading dose followed by 1500 mg IV q24h.  Will order Scr and f/u in AM.  Clance Boll 05/15/2011,3:53 PM

## 2011-05-15 NOTE — ED Notes (Signed)
MD at bedside. 

## 2011-05-16 ENCOUNTER — Inpatient Hospital Stay (HOSPITAL_COMMUNITY): Payer: Medicare Other

## 2011-05-16 LAB — COMPREHENSIVE METABOLIC PANEL
AST: 20 U/L (ref 0–37)
Albumin: 2.2 g/dL — ABNORMAL LOW (ref 3.5–5.2)
CO2: 37 mEq/L — ABNORMAL HIGH (ref 19–32)
Calcium: 9.2 mg/dL (ref 8.4–10.5)
Creatinine, Ser: 1.19 mg/dL — ABNORMAL HIGH (ref 0.50–1.10)
GFR calc non Af Amer: 43 mL/min — ABNORMAL LOW (ref >90)
Total Protein: 5.6 g/dL — ABNORMAL LOW (ref 6.0–8.3)

## 2011-05-16 LAB — GLUCOSE, CAPILLARY: Glucose-Capillary: 185 mg/dL — ABNORMAL HIGH (ref 70–99)

## 2011-05-16 LAB — CBC
Hemoglobin: 9.7 g/dL — ABNORMAL LOW (ref 12.0–15.0)
MCH: 30.9 pg (ref 26.0–34.0)
RBC: 3.14 MIL/uL — ABNORMAL LOW (ref 3.87–5.11)

## 2011-05-16 MED ORDER — BISACODYL 10 MG RE SUPP: 10.0000 mg | Freq: Every day | RECTAL | Status: DC | PRN

## 2011-05-16 MED ORDER — RESOURCE INSTANT PROTEIN PO PWD PACKET
6.0000 g | Freq: Three times a day (TID) | ORAL | Status: DC
  Administered 2011-05-16 – 2011-05-19 (×10): 6 g via ORAL
  Filled 2011-05-16 (×11): qty 6

## 2011-05-16 MED ORDER — ADULT MULTIVITAMIN W/MINERALS CH
1.0000 | ORAL_TABLET | Freq: Every day | ORAL | Status: DC
  Administered 2011-05-16 – 2011-05-19 (×4): 1 via ORAL
  Filled 2011-05-16 (×4): qty 1

## 2011-05-16 MED ORDER — SENNA-DOCUSATE SODIUM 8.6-50 MG PO TABS
2.0000 | ORAL_TABLET | Freq: Every day | ORAL | Status: DC
  Administered 2011-05-16 – 2011-05-18 (×3): 2 via ORAL
  Filled 2011-05-16 (×5): qty 2

## 2011-05-16 MED ORDER — CLONIDINE HCL 0.1 MG PO TABS
0.1000 mg | ORAL_TABLET | Freq: Two times a day (BID) | ORAL | Status: DC | PRN
  Administered 2011-05-16: 0.1 mg via ORAL
  Filled 2011-05-16 (×2): qty 1

## 2011-05-16 MED ORDER — FERROUS SULFATE 325 (65 FE) MG PO TABS
325.0000 mg | ORAL_TABLET | Freq: Every day | ORAL | Status: DC
  Administered 2011-05-17 – 2011-05-19 (×3): 325 mg via ORAL
  Filled 2011-05-16 (×3): qty 1

## 2011-05-16 MED ORDER — INSULIN GLARGINE 100 UNIT/ML ~~LOC~~ SOLN
20.0000 [IU] | Freq: Every day | SUBCUTANEOUS | Status: DC
  Administered 2011-05-16 – 2011-05-18 (×3): 20 [IU] via SUBCUTANEOUS
  Filled 2011-05-16: qty 3

## 2011-05-16 MED ORDER — CALCIUM CARBONATE-VITAMIN D 500-200 MG-UNIT PO TABS
1.0000 | ORAL_TABLET | Freq: Two times a day (BID) | ORAL | Status: DC
  Administered 2011-05-16 – 2011-05-19 (×7): 1 via ORAL
  Filled 2011-05-16 (×8): qty 1

## 2011-05-16 MED ORDER — SACCHAROMYCES BOULARDII 250 MG PO CAPS
250.0000 mg | ORAL_CAPSULE | Freq: Two times a day (BID) | ORAL | Status: DC
  Administered 2011-05-16 – 2011-05-19 (×7): 250 mg via ORAL
  Filled 2011-05-16 (×8): qty 1

## 2011-05-16 MED ORDER — PANTOPRAZOLE SODIUM 40 MG PO TBEC
40.0000 mg | DELAYED_RELEASE_TABLET | Freq: Every day | ORAL | Status: DC
  Administered 2011-05-16 – 2011-05-19 (×4): 40 mg via ORAL
  Filled 2011-05-16 (×4): qty 1

## 2011-05-16 MED ORDER — POLYETHYLENE GLYCOL 3350 17 G PO PACK
17.0000 g | PACK | Freq: Every day | ORAL | Status: DC
  Administered 2011-05-16 – 2011-05-19 (×4): 17 g via ORAL
  Filled 2011-05-16 (×4): qty 1

## 2011-05-16 MED ORDER — INSULIN ASPART 100 UNIT/ML ~~LOC~~ SOLN
0.0000 [IU] | Freq: Every day | SUBCUTANEOUS | Status: DC
  Administered 2011-05-17 – 2011-05-18 (×2): 2 [IU] via SUBCUTANEOUS

## 2011-05-16 MED ORDER — ROSUVASTATIN CALCIUM 20 MG PO TABS
20.0000 mg | ORAL_TABLET | Freq: Every day | ORAL | Status: DC
  Administered 2011-05-16 – 2011-05-19 (×4): 20 mg via ORAL
  Filled 2011-05-16 (×4): qty 1

## 2011-05-16 MED ORDER — METOPROLOL TARTRATE 50 MG PO TABS
50.0000 mg | ORAL_TABLET | Freq: Two times a day (BID) | ORAL | Status: DC
  Administered 2011-05-16 (×2): 50 mg via ORAL
  Filled 2011-05-16 (×4): qty 1

## 2011-05-16 MED ORDER — LOSARTAN POTASSIUM 50 MG PO TABS
100.0000 mg | ORAL_TABLET | Freq: Every day | ORAL | Status: DC
  Administered 2011-05-16 – 2011-05-19 (×4): 100 mg via ORAL
  Filled 2011-05-16 (×4): qty 2

## 2011-05-16 MED ORDER — INSULIN ASPART 100 UNIT/ML ~~LOC~~ SOLN
0.0000 [IU] | Freq: Three times a day (TID) | SUBCUTANEOUS | Status: DC
  Administered 2011-05-16: 3 [IU] via SUBCUTANEOUS
  Administered 2011-05-16: 2 [IU] via SUBCUTANEOUS
  Administered 2011-05-17 (×2): 3 [IU] via SUBCUTANEOUS
  Administered 2011-05-18: 8 [IU] via SUBCUTANEOUS
  Administered 2011-05-18: 3 [IU] via SUBCUTANEOUS
  Administered 2011-05-18 – 2011-05-19 (×2): 2 [IU] via SUBCUTANEOUS
  Filled 2011-05-16: qty 3

## 2011-05-16 MED ORDER — DARIFENACIN HYDROBROMIDE ER 7.5 MG PO TB24
7.5000 mg | ORAL_TABLET | Freq: Every day | ORAL | Status: DC
  Administered 2011-05-16 – 2011-05-19 (×4): 7.5 mg via ORAL
  Filled 2011-05-16 (×4): qty 1

## 2011-05-16 NOTE — Progress Notes (Signed)
Subjective: 66 F admitted with another UTI, MS changes made worse by Ativan administration = Obtunded. She woke up this am and is alert and oriented. She is weak. She is thirsty. No specific complaints. She does not remember any Sz activity.   Objective: Vital signs in last 24 hours: Temp:  [97.5 F (36.4 C)-98.3 F (36.8 C)] 97.6 F (36.4 C) (03/10 0400) Pulse Rate:  [50-88] 83  (03/10 0600) Resp:  [5-23] 21  (03/10 0600) BP: (104-172)/(40-120) 148/110 mmHg (03/10 0600) SpO2:  [95 %-100 %] 97 % (03/10 0600) Weight:  [104.327 kg (230 lb)-115.5 kg (254 lb 10.1 oz)] 115.5 kg (254 lb 10.1 oz) (03/09 2030) Weight change:     CBG (last 3)   Basename 05/15/11 1356  GLUCAP 184*    Intake/Output from previous day:  Intake/Output Summary (Last 24 hours) at 05/16/11 0727 Last data filed at 05/16/11 0600  Gross per 24 hour  Intake    375 ml  Output   2625 ml  Net  -2250 ml   03/09 0701 - 03/10 0700 In: 375 [I.V.:275; IV Piggyback:100] Out: 2625 [Urine:2625]   Physical Exam General appearance: Awake, alert, oriented.  OBESE  Eyes: conjunctivae/corneas clear. PERRL  Nose: Nares normal. Septum midline. Mucosa normal. No drainage or sinus tenderness.  Throat: lips, mucosa, and tongue normal; teeth and gums normal  Neck: no adenopathy, no carotid bruit, no JVD and thyroid not enlarged, symmetric, no tenderness/mass/nodules  Resp: CTAB Anteriorly  Cardio: Bradycardic, irregular rhythm  GI: soft, non-tender; bowel sounds normal; no masses, no organomegaly  Foley Catheter in place  Extremities: 2+ edema Pulses: 2+ and symmetric. Lymph nodes: no cervical lymphadenopathy  Neurologic: back to baseline. Sacral Decub   Lab Results:  [facility] 05/16/11 0345 05/15/11 1530  NA 137 138  K 3.8 4.3  CL 98 100  CO2 37* 32  GLUCOSE 152* 194*  BUN 14 14  CREATININE 1.19* 1.13*  CALCIUM 9.2 8.9  MG -- --  PHOS -- --     Basename 05/16/11 0345 05/15/11 1530  AST 20 41*  ALT  11 15  ALKPHOS 105 109  BILITOT 0.5 0.5  PROT 5.6* 5.5*  ALBUMIN 2.2* 2.0*     Basename 05/16/11 0345 05/15/11 1530  WBC 5.8 7.6  NEUTROABS -- 6.9  HGB 9.7* 9.5*  HCT 30.4* 29.5*  MCV 96.8 95.8  PLT 208 218    Lab Results  Component Value Date   INR 1.10* 02/05/2011   INR 1.14 11/15/2010   INR 1.88* 10/17/2010   PROTIME 13.2 02/05/2011   PROTIME 19.7 08/09/2008    No results found for this basename: CKTOTAL:3,CKMB:3,CKMBINDEX:3,TROPONINI:3 in the last 72 hours  No results found for this basename: TSH,T4TOTAL,FREET3,T3FREE,THYROIDAB in the last 72 hours  No results found for this basename: VITAMINB12:2,FOLATE:2,FERRITIN:2,TIBC:2,IRON:2,RETICCTPCT:2 in the last 72 hours  Micro Results: Recent Results (from the past 240 hour(s))  MRSA PCR SCREENING     Status: Normal   Collection Time   05/15/11  9:14 PM      Component Value Range Status Comment   MRSA by PCR NEGATIVE  NEGATIVE  Final      Studies/Results: Dg Chest 1 View  05/16/2011  *RADIOLOGY REPORT*  Clinical Data: Hypertension, evaluate for pneumonia.  CHEST - 1 VIEW  Comparison: 05/15/2011  Findings: Cardiomegaly.  Layering left pleural effusion. Associated consolidation.  No pneumothorax.  No acute osseous abnormality.  IMPRESSION: Cardiomegaly.  Layering left pleural effusion with associated consolidation; atelectasis versus pneumonia.  Original Report  Authenticated By: Waneta Martins, M.D.   Ct Head Wo Contrast  05/15/2011  *RADIOLOGY REPORT*  Clinical Data: Altered mental status  CT HEAD WITHOUT CONTRAST  Technique:  Contiguous axial images were obtained from the base of the skull through the vertex without contrast.  Comparison: 01/27/2011  Findings: The brain shows generalized atrophy.  There are chronic small vessel changes within the deep white matter.  No sign of acute infarction, mass lesion, hemorrhage, hydrocephalus or extra- axial collection.  The calvarium is unremarkable.  Sinuses are clear.  IMPRESSION:  Atrophy and chronic small vessel disease of the hemispheric white matter.  No acute or focal finding.  Original Report Authenticated By: Thomasenia Sales, M.D.   Dg Chest Portable 1 View  05/15/2011  *RADIOLOGY REPORT*  Clinical Data: Altered mental status.  Hypertension and asthma.  PORTABLE CHEST - 1 VIEW  Comparison: 04/17/2011  Findings: Artifact overlies chest.  The heart is enlarged.  There is pulmonary edema.  There is also volume loss in the left lower lobe.  There may be pleural fluid on the left.  No significant bony finding.  IMPRESSION: Probable congestive heart failure with pulmonary edema.  Volume loss in the left lower lobe that may relate to a left effusion. Left lower lobe pneumonia is not excluded.  Original Report Authenticated By: Thomasenia Sales, M.D.     Medications: Scheduled:   . furosemide  40 mg Intravenous Daily  . furosemide  60 mg Intravenous To ER  . gentamicin  80 mg Intravenous 30 min Pre-Op  . heparin  5,000 Units Subcutaneous Q8H  . LORazepam      . piperacillin-tazobactam (ZOSYN)  IV  3.375 g Intravenous Q8H  . piperacillin-tazobactam (ZOSYN)  IV  4.5 g Intravenous Once  . vancomycin  1,500 mg Intravenous Q24H  . vancomycin  2,000 mg Intravenous Once  . DISCONTD: piperacillin-tazobactam  3.375 g Intravenous Q6H   Continuous:   . sodium chloride 25 mL/hr at 05/16/11 0600     Assessment/Plan: Principal Problem:  *Obtunded Active Problems:  DM  HYPERTENSION, UNSPECIFIED  Atrial fibrillation  Anemia  Obesity  UTI (lower urinary tract infection)  Altered mental state   Obtunded/AMS - Improved as Ativan wore off.  Transfer out of stepdown to Tele.  Consult URO.  Feed. Restart Oral meds.   Recent Klebsiella Pyelonephritis/Hematuria/ Bilateral hydronephrosis with right greater than left S/P double-J stent placement/ Nausea (BETTER) - URO consult.  The Klebsiella Bacteremia was Sensitive to Zosyn last admit.  Recurrent UTI - ABX. Consult Uro.  ? Sz -  Check EEG. CCT (-). Consider Neuro w/up.  Since event only lasted 30 seconds and unwitnessed by med staff - willing to follow for "next event" - This was likely NOT a Sz.  ? HCAqPNA - Continue Abx - recheck CXR.  This may be overcall.  Repeat CXR reviewed above.  Constipation - Bowel Regimen.  Failure to thrive and immobility - PT/OT/CW   HTN - BP high - restart meds.  Atrial fibrillation, , chronic, off anticoagulation   Anemia - Hbg currently 9.5 -9.7 and stable  Renal insufficiency - 1.17 and fine this am.  Continue to provide a little hydration to flush kidneys despite the diuresis to help with the vol overload.  Obesity - Needs weight off - Nutrition consult  hyperlipidemia   Macular degeneration with reduced vision   Protein calorie malnutrition   Calcified coronary arteries   Chronic Peripheral edema/X-rays consistent with pulmonary edema - getting some  diuresis.   Full Code.   DM type II requiring insulin  - ISS  Basename 05/15/11 1356  GLUCAP 184*   ID -  Anti-infectives     Start     Dose/Rate Route Frequency Ordered Stop   05/31/11 0000   gentamicin (GARAMYCIN) IVPB 80 mg        80 mg 100 mL/hr over 30 Minutes Intravenous 30 min pre-op 05/15/11 2113     05/16/11 1700   vancomycin (VANCOCIN) 1,500 mg in sodium chloride 0.9 % 500 mL IVPB        1,500 mg 250 mL/hr over 120 Minutes Intravenous Every 24 hours 05/15/11 1559     05/15/11 2200  piperacillin-tazobactam (ZOSYN) IVPB 3.375 g       3.375 g 12.5 mL/hr over 240 Minutes Intravenous Every 8 hours 05/15/11 2027     05/15/11 2030   piperacillin-tazobactam (ZOSYN) IVPB 3.375 g  Status:  Discontinued        3.375 g 100 mL/hr over 30 Minutes Intravenous 4 times per day 05/15/11 2020 05/15/11 2026   05/15/11 1700   vancomycin (VANCOCIN) 2,000 mg in sodium chloride 0.9 % 500 mL IVPB        2,000 mg 250 mL/hr over 120 Minutes Intravenous  Once 05/15/11 1559 05/15/11 1949   05/15/11 1600   piperacillin-tazobactam (ZOSYN) IVPB 4.5 g       4.5 g 200 mL/hr over 30 Minutes Intravenous  Once 05/15/11 1525 05/15/11 1647         DVT Prophylaxis    LOS: 1 day   Stevin Bielinski M 05/16/2011, 7:27 AM

## 2011-05-16 NOTE — Progress Notes (Signed)
CSW spoke with patient's husband, Kathryn [name] (home#: [phone]) re: discharge plans. Patient was admitted from [facility] where she plans to return at discharge. Patient's other contact is her son, caytlin, better. (cell#: [phone]). CSW completed FL2 and faxed information to facility.   FL2 in shadow chart for MD signature.   [name], LCSWA [phone]  (Weekend Coverage)

## 2011-05-16 NOTE — Consult Note (Signed)
Urology Consult  Referring physician: Timothy Lasso Reason for referral: Urosepsis  Chief Complaint: fever  History of Present Illness:   Kathryn [name] is 78 years of age. She has been followed in the office for several years with recurrent cystitis and gross hematuria. Prior assessment has included CT scan with contrast, cystoscopy and urine cytologies which have failed to reveal any significant pathology. The stent was admitted recently with fever and chills and positive urine and blood cultures for Klebsiella. She was initially felt to have some left-sided hydronephrosis on ultrasound. CT scan did raise the concern now retroperitoneal air. Subsequent CT with and contrast did confirm a small amount of retro-peritoneal which totally resolved. The patient was initially felt to have left-sided hydronephrosis but most recent imaging has shown right-sided hydronephrosis. Her creatinine has waxed and waned but has remained elevated and the has not allowed for intravenous contrast studies. The patient is clinically improved with resolution of her abdominal pain, fever and leukocytosis but has continued to have renal insufficiency with intermittent hematuria.  On 2/21, she had cysto, and Right  Retrograde pyelography and R JJ stent. No definite pathology could be ascertained. She now presents with  Klebsiella urosepsis.    Past Medical History  Diagnosis Date  . Osteoporosis   . DM (diabetes mellitus)   . HTN (hypertension)     unspecified  . Atrial fibrillation   . Macular degeneration   . Osteoarthritis   . Edema   . Anemia   . Hydronephrosis     left  . UTI (lower urinary tract infection)   . Pressure ulcer of back    Past Surgical History  Procedure Date  . Replacement total knee 08/2007    bilateral  . Appendectomy   . Cholecystectomy   . Colonoscopy   . Upper gastrointestinal endoscopy   . Cystoscopy w/ ureteral stent placement 04/29/2011    Procedure: CYSTOSCOPY WITH RETROGRADE  PYELOGRAM/URETERAL STENT PLACEMENT;  Surgeon: Valetta Fuller, MD;  Location: [facility];  Service: Urology;  Laterality: Bilateral;  bilateral retrograde, double j stent. Cystogram   . Ureteroscopy 04/29/2011    Procedure: URETEROSCOPY;  Surgeon: Valetta Fuller, MD;  Location: [facility];  Service: Urology;  Laterality: Right;    Medications: I have reviewed the patient's current medications. Allergies:  Allergies  Allergen Reactions  . Celebrex (Celecoxib)   . Nsaids   . Sulfa Antibiotics     Family History  Problem Relation Age of Onset  . Diabetes Father   . Atrial fibrillation Father   . Breast cancer Paternal Grandmother   . Heart disease Mother    Social History:  reports that she has never smoked. She has never used smokeless tobacco. She reports that she does not drink alcohol or use illicit drugs.  ROS: All systems are reviewed and negative except as noted. Feeling better post IV antibiotics and fluid resuscitation  Physical Exam:  Vital signs in last 24 hours: Temp:  [97.5 F (36.4 C)-99.3 F (37.4 C)] 98.3 F (36.8 C) (03/10 1008) Pulse Rate:  [50-90] 90  (03/10 1008) Resp:  [5-23] 20  (03/10 1008) BP: (104-194)/(40-120) 194/86 mmHg (03/10 1008) SpO2:  [95 %-100 %] 96 % (03/10 1008) Weight:  [104.327 kg (230 lb)-115.5 kg (254 lb 10.1 oz)] 113.1 kg (249 lb 5.4 oz) (03/10 1008)  Cardiovascular: Skin warm; not flushed Respiratory: Breaths quiet; no shortness of breath Abdomen: No masses Neurological: Normal sensation to touch Musculoskeletal: Normal motor function arms and legs Lymphatics: No inguinal  adenopathy Skin: No rashes Genitourinary:normal BUS  Laboratory Data:  Results for orders placed during the hospital encounter of 05/15/11 (from the past 72 hour(s))  GLUCOSE, CAPILLARY     Status: Abnormal   Collection Time   05/15/11  1:56 PM      Component Value Range Comment   Glucose-Capillary 184 (*) 70 - 99 (mg/dL)    Comment 1 Notify RN      Comment 2  Documented in Chart     CBC     Status: Abnormal   Collection Time   05/15/11  3:30 PM      Component Value Range Comment   WBC 7.6  4.0 - 10.5 (K/uL)    RBC 3.08 (*) 3.87 - 5.11 (MIL/uL)    Hemoglobin 9.5 (*) 12.0 - 15.0 (g/dL)    HCT 16.1 (*) 09.6 - 46.0 (%)    MCV 95.8  78.0 - 100.0 (fL)    MCH 30.8  26.0 - 34.0 (pg)    MCHC 32.2  30.0 - 36.0 (g/dL)    RDW 04.5 (*) 40.9 - 15.5 (%)    Platelets 218  150 - 400 (K/uL)   DIFFERENTIAL     Status: Abnormal   Collection Time   05/15/11  3:30 PM      Component Value Range Comment   Neutrophils Relative 90 (*) 43 - 77 (%)    Neutro Abs 6.9  1.7 - 7.7 (K/uL)    Lymphocytes Relative 5 (*) 12 - 46 (%)    Lymphs Abs 0.4 (*) 0.7 - 4.0 (K/uL)    Monocytes Relative 4  3 - 12 (%)    Monocytes Absolute 0.3  0.1 - 1.0 (K/uL)    Eosinophils Relative 1  0 - 5 (%)    Eosinophils Absolute 0.0  0.0 - 0.7 (K/uL)    Basophils Relative 0  0 - 1 (%)    Basophils Absolute 0.0  0.0 - 0.1 (K/uL)   COMPREHENSIVE METABOLIC PANEL     Status: Abnormal   Collection Time   05/15/11  3:30 PM      Component Value Range Comment   Sodium 138  135 - 145 (mEq/L)    Potassium 4.3  3.5 - 5.1 (mEq/L) SLIGHT HEMOLYSIS   Chloride 100  96 - 112 (mEq/L)    CO2 32  19 - 32 (mEq/L)    Glucose, Bld 194 (*) 70 - 99 (mg/dL)    BUN 14  6 - 23 (mg/dL)    Creatinine, Ser 8.11 (*) 0.50 - 1.10 (mg/dL)    Calcium 8.9  8.4 - 10.5 (mg/dL)    Total Protein 5.5 (*) 6.0 - 8.3 (g/dL)    Albumin 2.0 (*) 3.5 - 5.2 (g/dL)    AST 41 (*) 0 - 37 (U/L) SLIGHT HEMOLYSIS   ALT 15  0 - 35 (U/L)    Alkaline Phosphatase 109  39 - 117 (U/L)    Total Bilirubin 0.5  0.3 - 1.2 (mg/dL)    GFR calc non Af Amer 46 (*) >90 (mL/min)    GFR calc Af Amer 53 (*) >90 (mL/min)   LIPASE, BLOOD     Status: Normal   Collection Time   05/15/11  3:30 PM      Component Value Range Comment   Lipase 21  11 - 59 (U/L)   LACTIC ACID, PLASMA     Status: Normal   Collection Time   05/15/11  3:30 PM      Component  Value  Range Comment   Lactic Acid, Venous 2.0  0.5 - 2.2 (mmol/L)   CULTURE, BLOOD (ROUTINE X 2)     Status: Normal (Preliminary result)   Collection Time   05/15/11  3:30 PM      Component Value Range Comment   Specimen Description BLOOD LEFT HAND  3 ML IN [facility] BOTTLE      Special Requests NONE      Culture  Setup Time [id number]      Culture        Value:        BLOOD CULTURE RECEIVED NO GROWTH TO DATE CULTURE WILL BE HELD FOR 5 DAYS BEFORE ISSUING A FINAL NEGATIVE REPORT   Report Status PENDING     CULTURE, BLOOD (ROUTINE X 2)     Status: Normal (Preliminary result)   Collection Time   05/15/11  3:35 PM      Component Value Range Comment   Specimen Description BLOOD LEFT HAND  3 ML IN [facility] BOTTLE      Special Requests NONE      Culture  Setup Time [id number]      Culture        Value:        BLOOD CULTURE RECEIVED NO GROWTH TO DATE CULTURE WILL BE HELD FOR 5 DAYS BEFORE ISSUING A FINAL NEGATIVE REPORT   Report Status PENDING     URINALYSIS, ROUTINE W REFLEX MICROSCOPIC     Status: Abnormal   Collection Time   05/15/11  4:02 PM      Component Value Range Comment   Color, Urine AMBER (*) YELLOW  BIOCHEMICALS MAY BE AFFECTED BY COLOR   APPearance TURBID (*) CLEAR     Specific Gravity, Urine 1.019  1.005 - 1.030     pH 6.5  5.0 - 8.0     Glucose, UA NEGATIVE  NEGATIVE (mg/dL)    Hgb urine dipstick LARGE (*) NEGATIVE     Bilirubin Urine SMALL (*) NEGATIVE     Ketones, ur NEGATIVE  NEGATIVE (mg/dL)    Protein, ur 102 (*) NEGATIVE (mg/dL)    Urobilinogen, UA 1.0  0.0 - 1.0 (mg/dL)    Nitrite NEGATIVE  NEGATIVE     Leukocytes, UA LARGE (*) NEGATIVE    URINE MICROSCOPIC-ADD ON     Status: Abnormal   Collection Time   05/15/11  4:02 PM      Component Value Range Comment   WBC, UA TOO NUMEROUS TO COUNT  <3 (WBC/hpf)    RBC / HPF 11-20  <3 (RBC/hpf)    Bacteria, UA FEW (*) RARE     Urine-Other MICROSCOPIC EXAM PERFORMED ON UNCONCENTRATED URINE   LESS THAN 10 mL OF URINE SUBMITTED  MRSA PCR  SCREENING     Status: Normal   Collection Time   05/15/11  9:14 PM      Component Value Range Comment   MRSA by PCR NEGATIVE  NEGATIVE    COMPREHENSIVE METABOLIC PANEL     Status: Abnormal   Collection Time   05/16/11  3:45 AM      Component Value Range Comment   Sodium 137  135 - 145 (mEq/L)    Potassium 3.8  3.5 - 5.1 (mEq/L)    Chloride 98  96 - 112 (mEq/L)    CO2 37 (*) 19 - 32 (mEq/L)    Glucose, Bld 152 (*) 70 - 99 (mg/dL)    BUN 14  6 - 23 (mg/dL)  Creatinine, Ser 1.19 (*) 0.50 - 1.10 (mg/dL)    Calcium 9.2  8.4 - 10.5 (mg/dL)    Total Protein 5.6 (*) 6.0 - 8.3 (g/dL)    Albumin 2.2 (*) 3.5 - 5.2 (g/dL)    AST 20  0 - 37 (U/L)    ALT 11  0 - 35 (U/L)    Alkaline Phosphatase 105  39 - 117 (U/L)    Total Bilirubin 0.5  0.3 - 1.2 (mg/dL)    GFR calc non Af Amer 43 (*) >90 (mL/min)    GFR calc Af Amer 50 (*) >90 (mL/min)   CBC     Status: Abnormal   Collection Time   05/16/11  3:45 AM      Component Value Range Comment   WBC 5.8  4.0 - 10.5 (K/uL)    RBC 3.14 (*) 3.87 - 5.11 (MIL/uL)    Hemoglobin 9.7 (*) 12.0 - 15.0 (g/dL)    HCT 16.1 (*) 09.6 - 46.0 (%)    MCV 96.8  78.0 - 100.0 (fL)    MCH 30.9  26.0 - 34.0 (pg)    MCHC 31.9  30.0 - 36.0 (g/dL)    RDW 04.5 (*) 40.9 - 15.5 (%)    Platelets 208  150 - 400 (K/uL)   GLUCOSE, CAPILLARY     Status: Abnormal   Collection Time   05/16/11  6:48 AM      Component Value Range Comment   Glucose-Capillary 131 (*) 70 - 99 (mg/dL)    Comment 1 Notify RN      Comment 2 Documented in Chart      Recent Results (from the past 240 hour(s))  CULTURE, BLOOD (ROUTINE X 2)     Status: Normal (Preliminary result)   Collection Time   05/15/11  3:30 PM      Component Value Range Status Comment   Specimen Description BLOOD LEFT HAND  3 ML IN [facility] BOTTLE   Final    Special Requests NONE   Final    Culture  Setup Time [id number]   Final    Culture     Final    Value:        BLOOD CULTURE RECEIVED NO GROWTH TO DATE CULTURE WILL BE HELD FOR  5 DAYS BEFORE ISSUING A FINAL NEGATIVE REPORT   Report Status PENDING   Incomplete   CULTURE, BLOOD (ROUTINE X 2)     Status: Normal (Preliminary result)   Collection Time   05/15/11  3:35 PM      Component Value Range Status Comment   Specimen Description BLOOD LEFT HAND  3 ML IN [facility] BOTTLE   Final    Special Requests NONE   Final    Culture  Setup Time [id number]   Final    Culture     Final    Value:        BLOOD CULTURE RECEIVED NO GROWTH TO DATE CULTURE WILL BE HELD FOR 5 DAYS BEFORE ISSUING A FINAL NEGATIVE REPORT   Report Status PENDING   Incomplete   MRSA PCR SCREENING     Status: Normal   Collection Time   05/15/11  9:14 PM      Component Value Range Status Comment   MRSA by PCR NEGATIVE  NEGATIVE  Final    Creatinine:  Basename 05/16/11 0345 05/15/11 1530  CREATININE 1.19* 1.13*    Xrays: See report/chart CHF  Impression/Assessment:  Urosepsis in diabetic with obstruction ( ? Papillary necrosis).  She looks much improved post antibiotics and fluids.   Plan:  Follow-up per Dr. Isabel Caprice.   Naksh Radi I 05/16/2011, 10:35 AM

## 2011-05-17 ENCOUNTER — Inpatient Hospital Stay (HOSPITAL_COMMUNITY)
Admit: 2011-05-17 | Discharge: 2011-05-17 | Disposition: A | Discharge status: Home / Self Care | Payer: Medicare Other | Attending: Internal Medicine | Admitting: Internal Medicine

## 2011-05-17 DIAGNOSIS — R569 Unspecified convulsions: Secondary | ICD-10-CM

## 2011-05-17 LAB — CBC
MCH: 31.5 pg (ref 26.0–34.0)
MCHC: 32.5 g/dL (ref 30.0–36.0)
MCV: 96.9 fL (ref 78.0–100.0)
Platelets: 199 10*3/uL (ref 150–400)
RDW: 17.4 % — ABNORMAL HIGH (ref 11.5–15.5)

## 2011-05-17 LAB — BASIC METABOLIC PANEL
CO2: 40 mEq/L (ref 19–32)
Calcium: 8.9 mg/dL (ref 8.4–10.5)
Creatinine, Ser: 1.23 mg/dL — ABNORMAL HIGH (ref 0.50–1.10)
Glucose, Bld: 183 mg/dL — ABNORMAL HIGH (ref 70–99)

## 2011-05-17 LAB — COMPREHENSIVE METABOLIC PANEL
AST: 23 U/L (ref 0–37)
Albumin: 2 g/dL — ABNORMAL LOW (ref 3.5–5.2)
Alkaline Phosphatase: 95 U/L (ref 39–117)
BUN: 16 mg/dL (ref 6–23)
Chloride: 95 mEq/L — ABNORMAL LOW (ref 96–112)
Potassium: 3 mEq/L — ABNORMAL LOW (ref 3.5–5.1)
Total Bilirubin: 0.4 mg/dL (ref 0.3–1.2)

## 2011-05-17 LAB — GLUCOSE, CAPILLARY
Glucose-Capillary: 186 mg/dL — ABNORMAL HIGH (ref 70–99)
Glucose-Capillary: 202 mg/dL — ABNORMAL HIGH (ref 70–99)

## 2011-05-17 MED ORDER — METOPROLOL TARTRATE 50 MG PO TABS
50.0000 mg | ORAL_TABLET | Freq: Two times a day (BID) | ORAL | Status: DC
  Filled 2011-05-17: qty 1

## 2011-05-17 MED ORDER — METOPROLOL TARTRATE 12.5 MG HALF TABLET
12.5000 mg | ORAL_TABLET | Freq: Two times a day (BID) | ORAL | Status: DC
  Administered 2011-05-17 – 2011-05-18 (×3): 12.5 mg via ORAL
  Filled 2011-05-17 (×8): qty 1

## 2011-05-17 MED ORDER — POTASSIUM CHLORIDE CRYS ER 20 MEQ PO TBCR
20.0000 meq | EXTENDED_RELEASE_TABLET | ORAL | Status: AC
  Administered 2011-05-17 (×2): 20 meq via ORAL
  Filled 2011-05-17 (×3): qty 1
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
## 2011-05-17 NOTE — Progress Notes (Addendum)
Patient had an asymptomatic 6 beat run of vtach. Nurse in room with patient. No complaints of chest pain. Rhythm is Afib - which is baseline. Made on call Dr. Virgel Manifold aware. No orders given . VSS. BP elevated 170s/80s.   Patient also had @ 2330 asymptomatic  8 beat run of vtach . Will continue to monitor and make strip available for team in am.

## 2011-05-17 NOTE — Progress Notes (Signed)
Patient ID: Kathryn [name], female   DOB: 03/11/1933, 78 y.o.   MRN: [id number]   Subjective: Patient reports feeling substantially better this morning. She has remained afebrile with normal white blood cell count. Her renal function has also not deteriorated. Dr. Valentina Lucks is not convinced that the patient had a true seizure event. Preliminary urine culture is growing gram-negative rods the final ID insensitivity remain pending.  Objective: Vital signs in last 24 hours: Temp:  [97.7 F (36.5 C)-98.6 F (37 C)] 97.7 F (36.5 C) (03/11 0700) Pulse Rate:  [52-92] 52  (03/11 0700) Resp:  [18-20] 18  (03/11 0700) BP: (137-194)/(66-86) 163/66 mmHg (03/11 0700) SpO2:  [96 %-100 %] 98 % (03/11 0700) Weight:  [113 kg (249 lb 1.9 oz)-113.1 kg (249 lb 5.4 oz)] 113 kg (249 lb 1.9 oz) (03/11 0700)  Intake/Output from previous day: 03/10 0701 - 03/11 0700 In: 285 [P.O.:180; I.V.:50; IV Piggyback:55] Out: 4900 [Urine:4900] Intake/Output this shift:    Physical Exam:  Constitutional: Vital signs reviewed. WD WN in NAD   Eyes: PERRL, No scleral icterus.   Cardiovascular: Afib Pulmonary/Chest: Normal effort Abdominal: Soft. Non-tender, non-distended, bowel sounds are normal, no masses, organomegaly, or guarding present.  Extremities: No cyanosis or edema   Lab Results:  Basename 05/17/11 0438 05/16/11 0345 05/15/11 1530  HGB 9.0* 9.7* 9.5*  HCT 27.7* 30.4* 29.5*   BMET  Basename 05/17/11 0438 05/16/11 0345  NA 137 137  K 3.0* 3.8  CL 95* 98  CO2 39* 37*  GLUCOSE 111* 152*  BUN 16 14  CREATININE 1.21* 1.19*  CALCIUM 8.6 9.2   No results found for this basename: LABPT:3,INR:3 in the last 72 hours No results found for this basename: LABURIN:1 in the last 72 hours Results for orders placed during the hospital encounter of 05/15/11  CULTURE, BLOOD (ROUTINE X 2)     Status: Normal (Preliminary result)   Collection Time   05/15/11  3:30 PM      Component Value Range Status Comment   Specimen Description BLOOD LEFT HAND  3 ML IN [facility] BOTTLE   Final    Special Requests NONE   Final    Culture  Setup Time [id number]   Final    Culture     Final    Value:        BLOOD CULTURE RECEIVED NO GROWTH TO DATE CULTURE WILL BE HELD FOR 5 DAYS BEFORE ISSUING A FINAL NEGATIVE REPORT   Report Status PENDING   Incomplete   CULTURE, BLOOD (ROUTINE X 2)     Status: Normal (Preliminary result)   Collection Time   05/15/11  3:35 PM      Component Value Range Status Comment   Specimen Description BLOOD LEFT HAND  3 ML IN [facility] BOTTLE   Final    Special Requests NONE   Final    Culture  Setup Time [id number]   Final    Culture     Final    Value:        BLOOD CULTURE RECEIVED NO GROWTH TO DATE CULTURE WILL BE HELD FOR 5 DAYS BEFORE ISSUING A FINAL NEGATIVE REPORT   Report Status PENDING   Incomplete   URINE CULTURE     Status: Normal (Preliminary result)   Collection Time   05/15/11  4:02 PM      Component Value Range Status Comment   Specimen Description URINE, CATHETERIZED   Final    Special Requests NONE   Final  Culture  Setup Time [id number]   Final    Colony Count 55,000 COLONIES/ML   Final    Culture GRAM NEGATIVE RODS   Final    Report Status PENDING   Incomplete   MRSA PCR SCREENING     Status: Normal   Collection Time   05/15/11  9:14 PM      Component Value Range Status Comment   MRSA by PCR NEGATIVE  NEGATIVE  Final     Studies/Results: Dg Chest 1 View  05/16/2011  *RADIOLOGY REPORT*  Clinical Data: Hypertension, evaluate for pneumonia.  CHEST - 1 VIEW  Comparison: 05/15/2011  Findings: Cardiomegaly.  Layering left pleural effusion. Associated consolidation.  No pneumothorax.  No acute osseous abnormality.  IMPRESSION: Cardiomegaly.  Layering left pleural effusion with associated consolidation; atelectasis versus pneumonia.  Original Report Authenticated By: Waneta Martins, M.D.   Ct Head Wo Contrast  05/15/2011  *RADIOLOGY REPORT*  Clinical Data: Altered mental  status  CT HEAD WITHOUT CONTRAST  Technique:  Contiguous axial images were obtained from the base of the skull through the vertex without contrast.  Comparison: 01/27/2011  Findings: The brain shows generalized atrophy.  There are chronic small vessel changes within the deep white matter.  No sign of acute infarction, mass lesion, hemorrhage, hydrocephalus or extra- axial collection.  The calvarium is unremarkable.  Sinuses are clear.  IMPRESSION: Atrophy and chronic small vessel disease of the hemispheric white matter.  No acute or focal finding.  Original Report Authenticated By: Thomasenia Sales, M.D.   Dg Chest Portable 1 View  05/15/2011  *RADIOLOGY REPORT*  Clinical Data: Altered mental status.  Hypertension and asthma.  PORTABLE CHEST - 1 VIEW  Comparison: 04/17/2011  Findings: Artifact overlies chest.  The heart is enlarged.  There is pulmonary edema.  There is also volume loss in the left lower lobe.  There may be pleural fluid on the left.  No significant bony finding.  IMPRESSION: Probable congestive heart failure with pulmonary edema.  Volume loss in the left lower lobe that may relate to a left effusion. Left lower lobe pneumonia is not excluded.  Original Report Authenticated By: Thomasenia Sales, M.D.    Assessment/Plan:   Now admitted with presumptive recurrent urinary tract infection questionable urosepsis. Kathryn [name] is going to have chronic bacteriuria on her regular basis. She clearly is not is invalid she was on her last admission. Renal function remains at baseline. She has a right double-J stent which does appear to be functioning adequately. Nothing else for [place] to do acutely at this time. Continue to monitor her stay in the hospital.   LOS: 2 days   Deshon Hsiao S 05/17/2011, 9:22 AM

## 2011-05-17 NOTE — Progress Notes (Signed)
Pt's HR 40-50 on heart monitor while pt asleep.  Pt resting, BP 137/75, HR 45.  Dr. Felipa Eth notified. Order to hold am lopressor. Will continue to monitor. Newman Nip Greenwood

## 2011-05-17 NOTE — Progress Notes (Signed)
PT. HR WILL SOMETIMES DROP INTO THE HIGH 30'S, PT. IS ASYPOTMATIC VS. STABLE, DR. Evlyn Kanner NOTIFIED. WILL CONTINUE TO MONITOR AND OBSERVE PT.

## 2011-05-17 NOTE — Consult Note (Signed)
WOC consult Note Reason for Consult:two open areas between buttock crease Wound type: MASD (moisture associated skin damage) Pressure Ulcer POA: No Measurement:Proximal:  3cm x .5cm x .2cm    Distal: 2.5 x 1cm x .2 Wound JWJ:XBJYN, pink and granulating, partial thickness, no necrotic tissue Drainage (amount, consistency, odor) scant amount sero-sanguinous  Periwound:intact Dressing procedure/placement/frequency:Soft silicone foam dressing changed every M-W-F.  Low air loss overlay.  Positioning off of back except for meals.  Prevalon boots to prevent heel ulcers.Pressure Redistribution chair cushion for OOB and  at-home use. I will not follow.  Please re-consult if needed. Thanks, Ladona Mow, MSN, RN, [facility], CWOCN [phone])

## 2011-05-17 NOTE — Procedures (Signed)
EEG NUMBER:  [id number].   This routine EEG was requested in this woman administered with possible urosepsis.  There is a suspicious event that occurred in the hospital. Purpose of this EEG is to rule out subclinical seizure activity.  EEG was done with the patient awake and drowsy.  During periods of maximal wakefulness, background activities were composed of low- amplitude moderately organized alpha activities.  There did appear to be a posterior dominant rhythm at 8-1/2 cycles per second.  These activities were symmetric.  Photic stimulation provoked a prominent photomyogenic response.  No obvious photic driving was seen.  Hyperventilation was not performed due to the patient's inability to comply.  The patient spent much of the tracing appearing to be in a drowsy state. This is marked by more prominent delta and theta activities that were symmetric.  Stage 2 sleep was not seen.  CLINICAL INTERPRETATION:  This routine EEG done with the patient awake and drowsy is normal.          ______________________________ Denton Meek, MD    WG:NFAO D:  05/17/2011 22:52:24  T:  05/17/2011 23:16:13  Job #:  [id number]

## 2011-05-17 NOTE — Progress Notes (Signed)
PT. REFUSED TO HAVE AIR OVERLAY MATTRESS PLACED.

## 2011-05-17 NOTE — Progress Notes (Signed)
Occupational Therapy Note Chart reviewed. Note pt from SNF. Will defer OT eval to SNF. Sign off OT. Judithann Sauger OTR/L [phone] 05/17/2011

## 2011-05-17 NOTE — Progress Notes (Signed)
PT note.  Chart reviewed pt has Pending EEG results for possible seizure, had abn. Labs, will hold eval today, F/U in AM. Pt well known to therapy.

## 2011-05-17 NOTE — Progress Notes (Signed)
PT. CO2 IS 40, PT. CO2 WAS 39 WITH THE AM LABS.  DR. Evlyn Kanner MADE AWARE OF BOTH. NO NEW ORDERS, WILL CONTINUE TO MONITOR AND OBSERVE PT.

## 2011-05-17 NOTE — Progress Notes (Signed)
Pt. Had 11 beats of v-tach,  Pt. Is asyptomatic  dr. Felipa Eth notified, new orders received, will continue to monitor and observe pt.

## 2011-05-17 NOTE — Progress Notes (Signed)
PT. CO2 IS 40, DR. SOUTH NOTIFIED NO NEW ORDERS. WILL CONTINUE TO MONITOR AND OBSERVE PT.

## 2011-05-17 NOTE — Progress Notes (Signed)
CARE MANAGEMENT NOTE 05/17/2011  Patient:  Kathryn [name], Kathryn [name]   Account Number:  [id number]  Date Initiated:  05/17/2011  Documentation initiated by:  Colleen Can  Subjective/Objective Assessment:   dx AMS    Order for dme-overlay matress     Action/Plan:   This order is for inpatient -dme-inappropriate CM referral  Per notation in Epic pt is from SNF and plans are for return to [facility]   Anticipated DC Date:  05/18/2011   Anticipated DC Plan:  SKILLED NURSING FACILITY  In-house referral  Clinical Social Worker      DC Planning Services  NA      [facility] Choice  NA   Choice offered to / List presented to:  NA   DME arranged  NA      DME agency  NA     HH arranged  NA      HH agency  NA   Status of service:  Completed, signed off

## 2011-05-17 NOTE — Progress Notes (Signed)
PT. HAD 11 BEATS OF V-TACH,PT. IS ASYPTOMATIC VS STABLE.   DR. AVVA NOTIFIED NEW ORDERS HAVE BEEN RECEIVED, WILL CONTINUE TO MONITOR AND OBSERVE PT.

## 2011-05-17 NOTE — Progress Notes (Signed)
Subjective: Feels better. Slept well. No abd pain this AM. No vomiting. HR noted in the 40's at times  Objective: Vital signs in last 24 hours: Temp:  [97.7 F (36.5 C)-98.6 F (37 C)] 97.7 F (36.5 C) (03/11 0700) Pulse Rate:  [52-92] 52  (03/11 0700) Resp:  [18-20] 18  (03/11 0700) BP: (137-194)/(66-86) 163/66 mmHg (03/11 0700) SpO2:  [96 %-100 %] 98 % (03/11 0700) Weight:  [113 kg (249 lb 1.9 oz)-113.1 kg (249 lb 5.4 oz)] 113 kg (249 lb 1.9 oz) (03/11 0700)  Intake/Output from previous day: 03/10 0701 - 03/11 0700 In: 285 [P.O.:180; I.V.:50; IV Piggyback:55] Out: 4900 [Urine:4900] Intake/Output this shift:    General: alert, face symmetric, neck supple, lungs clear, no wheeze, ht IR/IR distant. abd obese NT, extrems 1+ bilat edema. Awake, alert, mentating well  Lab Results   [facility] 05/17/11 0438 05/16/11 0345  WBC 5.5 5.8  RBC 2.86* 3.14*  HGB 9.0* 9.7*  HCT 27.7* 30.4*  MCV 96.9 96.8  MCH 31.5 30.9  RDW 17.4* 17.3*  PLT 199 208    Basename 05/17/11 0438 05/16/11 0345  NA 137 137  K 3.0* 3.8  CL 95* 98  CO2 39* 37*  GLUCOSE 111* 152*  BUN 16 14  CREATININE 1.21* 1.19*  CALCIUM 8.6 9.2    Studies/Results: Dg Chest 1 View  05/16/2011  *RADIOLOGY REPORT*  Clinical Data: Hypertension, evaluate for pneumonia.  CHEST - 1 VIEW  Comparison: 05/15/2011  Findings: Cardiomegaly.  Layering left pleural effusion. Associated consolidation.  No pneumothorax.  No acute osseous abnormality.  IMPRESSION: Cardiomegaly.  Layering left pleural effusion with associated consolidation; atelectasis versus pneumonia.  Original Report Authenticated By: Waneta Martins, M.D.   Ct Head Wo Contrast  05/15/2011  *RADIOLOGY REPORT*  Clinical Data: Altered mental status  CT HEAD WITHOUT CONTRAST  Technique:  Contiguous axial images were obtained from the base of the skull through the vertex without contrast.  Comparison: 01/27/2011  Findings: The brain shows generalized atrophy.  There are  chronic small vessel changes within the deep white matter.  No sign of acute infarction, mass lesion, hemorrhage, hydrocephalus or extra- axial collection.  The calvarium is unremarkable.  Sinuses are clear.  IMPRESSION: Atrophy and chronic small vessel disease of the hemispheric white matter.  No acute or focal finding.  Original Report Authenticated By: Thomasenia Sales, M.D.   Dg Chest Portable 1 View  05/15/2011  *RADIOLOGY REPORT*  Clinical Data: Altered mental status.  Hypertension and asthma.  PORTABLE CHEST - 1 VIEW  Comparison: 04/17/2011  Findings: Artifact overlies chest.  The heart is enlarged.  There is pulmonary edema.  There is also volume loss in the left lower lobe.  There may be pleural fluid on the left.  No significant bony finding.  IMPRESSION: Probable congestive heart failure with pulmonary edema.  Volume loss in the left lower lobe that may relate to a left effusion. Left lower lobe pneumonia is not excluded.  Original Report Authenticated By: Thomasenia Sales, M.D.    Scheduled Meds:   . calcium-vitamin D  1 tablet Oral BID  . darifenacin  7.5 mg Oral Daily  . ferrous sulfate  325 mg Oral Q breakfast  . furosemide  40 mg Intravenous Daily  . heparin  5,000 Units Subcutaneous Q8H  . insulin aspart  0-15 Units Subcutaneous TID WC  . insulin aspart  0-5 Units Subcutaneous QHS  . insulin glargine  20 Units Subcutaneous QHS  . losartan  100 mg Oral Daily  . metoprolol  12.5 mg Oral BID  . mulitivitamin with minerals  1 tablet Oral Daily  . pantoprazole  40 mg Oral Daily  . piperacillin-tazobactam (ZOSYN)  IV  3.375 g Intravenous Q8H  . polyethylene glycol  17 g Oral Daily  . potassium chloride  20 mEq Oral Q3H  . protein supplement  6 g Oral TID WC  . rosuvastatin  20 mg Oral Daily  . saccharomyces boulardii  250 mg Oral BID  . sennosides-docusate sodium  2 tablet Oral QHS  . vancomycin  1,500 mg Intravenous Q24H  . DISCONTD: gentamicin  80 mg Intravenous 30 min Pre-Op  .  DISCONTD: metoprolol  50 mg Oral BID  . DISCONTD: metoprolol  50 mg Oral BID   Continuous Infusions:   . sodium chloride 25 mL/hr at 05/16/11 0600   PRN Meds:bisacodyl, cloNIDine  Assessment/Plan: RECURRENT UROSEPSIS: Looks better this AM. Still on dual therapy. Seen this AM by urology.  OBTUNDATION: Resolved ? SEIZURE: Not a very convincing sounding event. Doubt a true issue. EEG pending BRADYCARDIA: Will reduce metoprolol HYPERTENSION: BP fair. May need other agents DM 2: BS better at 111 today HYPOKALEMIA: Getting oral replacement ANEMIA:  Has gone from 9.7 to 9.0 with hydration HYPERLIPIDEMIA: On Rx HYDRONEPHROSIS WITH STENTS: Creat staying pretty good.  CHF/PULMONARY EDEMA: Doing better clinically after diuresis PLEURAL EFFUSION: Will watch for now, not enough compromise to need thoracentesis ATRIAL FIBRILLATION: Rate reasonable overall   LOS: 2 days   Kathryn [name] 05/17/2011, 8:11 AM

## 2011-05-18 LAB — COMPREHENSIVE METABOLIC PANEL
Alkaline Phosphatase: 97 U/L (ref 39–117)
BUN: 16 mg/dL (ref 6–23)
GFR calc Af Amer: 52 mL/min — ABNORMAL LOW (ref >90)
GFR calc non Af Amer: 45 mL/min — ABNORMAL LOW (ref >90)
Glucose, Bld: 152 mg/dL — ABNORMAL HIGH (ref 70–99)
Potassium: 3.5 mEq/L (ref 3.5–5.1)
Total Bilirubin: 0.4 mg/dL (ref 0.3–1.2)
Total Protein: 5.5 g/dL — ABNORMAL LOW (ref 6.0–8.3)

## 2011-05-18 LAB — CBC
HCT: 29.1 % — ABNORMAL LOW (ref 36.0–46.0)
Platelets: 194 10*3/uL (ref 150–400)
RDW: 17.2 % — ABNORMAL HIGH (ref 11.5–15.5)
WBC: 5.3 10*3/uL (ref 4.0–10.5)

## 2011-05-18 LAB — GLUCOSE, CAPILLARY: Glucose-Capillary: 233 mg/dL — ABNORMAL HIGH (ref 70–99)

## 2011-05-18 MED ORDER — DIPHENHYDRAMINE HCL 25 MG PO CAPS
25.0000 mg | ORAL_CAPSULE | ORAL | Status: DC | PRN
  Administered 2011-05-18 – 2011-05-19 (×2): 25 mg via ORAL
  Filled 2011-05-18 (×2): qty 1

## 2011-05-18 NOTE — Progress Notes (Signed)
Patient ID: Kathryn [name], female   DOB: 1933-10-02, 78 y.o.   MRN: [id number]   Subjective: Patient reports feeling better yesterday that she has a long time. Patient's of wall and has no complaints today.  Objective: Vital signs in last 24 hours: Temp:  [98.1 F (36.7 C)-98.4 F (36.9 C)] 98.4 F (36.9 C) (03/12 0530) Pulse Rate:  [67-78] 67  (03/12 0530) Resp:  [18-20] 20  (03/12 0530) BP: (137-149)/(69-70) 146/69 mmHg (03/12 0530) SpO2:  [98 %-99 %] 98 % (03/12 0530) Weight:  [111.8 kg (246 lb 7.6 oz)] 111.8 kg (246 lb 7.6 oz) (03/12 0500)  Intake/Output from previous day: 03/11 0701 - 03/12 0700 In: 1295 [P.O.:320; I.V.:325; IV Piggyback:650] Out: 4550 [Urine:4550] Intake/Output this shift:    Physical Exam:  Constitutional: Vital signs reviewed. WD WN in NAD  .   Cardiovascular: Afib Pulmonary/Chest: Normal effort Abdominal: Soft. Non-tender, non-distended, bowel sounds are normal, no masses, organomegaly, or guarding present.  Genitourinary:Not done Extremities: No cyanosis or edema   Lab Results:  Basename 05/18/11 0501 05/17/11 0438 05/16/11 0345  HGB 9.4* 9.0* 9.7*  HCT 29.1* 27.7* 30.4*   BMET  Basename 05/18/11 0501 05/17/11 1355  NA 138 138  K 3.5 3.2*  CL 95* 94*  CO2 39* 40*  GLUCOSE 152* 183*  BUN 16 17  CREATININE 1.15* 1.23*  CALCIUM 8.8 8.9   No results found for this basename: LABPT:3,INR:3 in the last 72 hours No results found for this basename: LABURIN:1 in the last 72 hours Results for orders placed during the hospital encounter of 05/15/11  CULTURE, BLOOD (ROUTINE X 2)     Status: Normal (Preliminary result)   Collection Time   05/15/11  3:30 PM      Component Value Range Status Comment   Specimen Description BLOOD LEFT HAND  3 ML IN [facility] BOTTLE   Final    Special Requests NONE   Final    Culture  Setup Time [id number]   Final    Culture     Final    Value:        BLOOD CULTURE RECEIVED NO GROWTH TO DATE CULTURE WILL BE HELD FOR 5  DAYS BEFORE ISSUING A FINAL NEGATIVE REPORT   Report Status PENDING   Incomplete   CULTURE, BLOOD (ROUTINE X 2)     Status: Normal (Preliminary result)   Collection Time   05/15/11  3:35 PM      Component Value Range Status Comment   Specimen Description BLOOD LEFT HAND  3 ML IN [facility] BOTTLE   Final    Special Requests NONE   Final    Culture  Setup Time [id number]   Final    Culture     Final    Value:        BLOOD CULTURE RECEIVED NO GROWTH TO DATE CULTURE WILL BE HELD FOR 5 DAYS BEFORE ISSUING A FINAL NEGATIVE REPORT   Report Status PENDING   Incomplete   URINE CULTURE     Status: Normal (Preliminary result)   Collection Time   05/15/11  4:02 PM      Component Value Range Status Comment   Specimen Description URINE, CATHETERIZED   Final    Special Requests NONE   Final    Culture  Setup Time [id number]   Final    Colony Count 55,000 COLONIES/ML   Final    Culture GRAM NEGATIVE RODS   Final    Report Status PENDING  Incomplete   MRSA PCR SCREENING     Status: Normal   Collection Time   05/15/11  9:14 PM      Component Value Range Status Comment   MRSA by PCR NEGATIVE  NEGATIVE  Final     Studies/Results: No results found.  Assessment/Plan:  Ms. Deitrick is clearly better. No further neurologic events. Hemoglobin stable to improve with a normal white blood cell count and a creatinine that is markedly better status post right-sided double-J stent placement.   LOS: 3 days   Yanis Juma S 05/18/2011, 8:04 AM

## 2011-05-18 NOTE — Evaluation (Signed)
Physical Therapy Evaluation Patient Details Name: Kathryn [name] MRN: [id number] DOB: 01-19-1934 Today's Date: 05/18/2011  Problem List:  Patient Active Problem List  Diagnoses  . DM  . HYPERCHOLESTEROLEMIA  IIA  . MACULAR DEGENERATION  . HYPERTENSION, UNSPECIFIED  . Atrial fibrillation  . OSTEOARTHRITIS  . OSTEOPOROSIS  . EDEMA  . Anemia  . Pyelonephritis, acute  . Renal insufficiency  . Hyponatremia  . Obesity  . Bacteremia due to Klebsiella pneumoniae  . Obtunded  . UTI (lower urinary tract infection)  . Altered mental state    Past Medical History:  Past Medical History  Diagnosis Date  . Osteoporosis   . DM (diabetes mellitus)   . HTN (hypertension)     unspecified  . Atrial fibrillation   . Macular degeneration   . Osteoarthritis   . Edema   . Anemia   . Hydronephrosis     left  . UTI (lower urinary tract infection)   . Pressure ulcer of back    Past Surgical History:  Past Surgical History  Procedure Date  . Replacement total knee 08/2007    bilateral  . Appendectomy   . Cholecystectomy   . Colonoscopy   . Upper gastrointestinal endoscopy   . Cystoscopy w/ ureteral stent placement 04/29/2011    Procedure: CYSTOSCOPY WITH RETROGRADE PYELOGRAM/URETERAL STENT PLACEMENT;  Surgeon: Valetta Fuller, MD;  Location: [facility] ORS;  Service: Urology;  Laterality: Bilateral;  bilateral retrograde, double j stent. Cystogram   . Ureteroscopy 04/29/2011    Procedure: URETEROSCOPY;  Surgeon: Valetta Fuller, MD;  Location: [facility];  Service: Urology;  Laterality: Right;    PT Assessment/Plan/Recommendation PT Assessment Clinical Impression Statement: Pt presents with diagnosis of decreased responsiveness. Pt recently DC'd from [facility] to SNF for rehab. Pt continues to demonstrate limited mobiltiy, acitivty tolerance and strength. Attempted OOB with lift equipment (Maxi-move and STEDY-maximove not available and  pt unable to stand enough for use of STEDY). Pt will benefit from  skiled PT in acute setting to improve strength, activity tolerance, transfers in preparation for continued rehab at [facility].  PT Recommendation/Assessment: Patient will need skilled PT in the acute care venue PT Problem List: Decreased strength;Decreased activity tolerance;Decreased range of motion;Obesity;Decreased mobility PT Therapy Diagnosis : Difficulty walking;Generalized weakness PT Plan PT Frequency: Min 2X/week PT Treatment/Interventions: DME instruction;Functional mobility training;Therapeutic activities;Therapeutic exercise;Patient/family education PT Recommendation Recommendations for Other Services: OT consult Follow Up Recommendations: Skilled nursing facility Equipment Recommended: Defer to next venue PT Goals  Acute Rehab PT Goals PT Goal Formulation: With patient Time For Goal Achievement: 2 weeks Pt will go Supine/Side to Sit: with mod assist PT Goal: Supine/Side to Sit - Progress: Goal set today Pt will go Sit to Supine/Side: with mod assist PT Goal: Sit to Supine/Side - Progress: Goal set today Pt will go Sit to Stand: with max assist PT Goal: Sit to Stand - Progress: Goal set today Pt will Transfer Bed to Chair/Chair to Bed: with max assist PT Transfer Goal: Bed to Chair/Chair to Bed - Progress: Goal set today PT Goal: Ambulate - Progress: Discontinued (comment) Pt will Perform Home Exercise Program: with supervision, verbal cues required/provided PT Goal: Perform Home Exercise Program - Progress: Goal set today  PT Evaluation Precautions/Restrictions  Precautions Precautions: Fall Prior Functioning  Home Living Lives With: Spouse Receives Help From: Personal care attendant Type of Home: Skilled Nursing Facility Home Layout: One level Home Access: Level entry Prior Function Level of Independence: Needs assistance with tranfers;Needs assistance with ADLs;Requires  assistive device for independence Cognition Cognition Arousal/Alertness: Awake/alert Overall  Cognitive Status: Appears within functional limits for tasks assessed Orientation Level: Oriented to person;Oriented to place;Oriented to time Sensation/Coordination Sensation Light Touch: Appears Intact Coordination Gross Motor Movements are Fluid and Coordinated: Yes Extremity Assessment RLE AROM (degrees) RLE Overall AROM Comments: Strength 3+/5 knee extension. Pt lacks full knee extension.  LLE AROM (degrees) LLE Overall AROM Comments: Strength 3+/5 knee extension. Pt lacks full knee extension. Mobility (including Balance) Bed Mobility Bed Mobility: Yes Sit to Supine: 1: +2 Total assist;HOB elevated (comment degrees);With rail Sit to Supine - Details (indicate cue type and reason): Pt=30%. Assist for LEs off bed and trunk to upright. HOB60 degrees. Increased time.  Transfers Transfers: Yes Sit to Stand: 1: +2 Total assist Sit to Stand Details (indicate cue type and reason): Pt=30%. 3 attempts from bed. MAX encouragement. Pt only able to complete about 40% of transition -unable to achieve extension of trunk. Stood 4-5 seconds before needing to sit down. Pt unable to maintain static standing long enough for transfer to chair.  Stand to Sit: 1: +2 Total assist Stand to Sit Details: Pt=30%. Assist to control desent due to pt sitting abruptly.  Ambulation/Gait Ambulation/Gait: No  Balance Balance Assessed: No Exercise    End of Session PT - End of Session Equipment Utilized During Treatment: Gait belt Activity Tolerance: Patient limited by fatigue Patient left: in bed;with call bell in reach Nurse Communication: Need for lift equipment General Behavior During Session: [facility] for tasks performed Cognition: [facility] for tasks performed  Rebeca Alert [facility] 05/18/2011, 12:03 PM [phone]

## 2011-05-18 NOTE — Progress Notes (Signed)
CSW continues to follow for return to SNF. Pt from [facility] and can return. Pt has updated FL2 on chart. Pt will need to return via ambulance. CSW to continue to follow and assist.  Vennie Homans, Theresia Majors 05/18/2011 12:55 PM #[phone]

## 2011-05-18 NOTE — Progress Notes (Signed)
Subjective: Had a reasonable night. Slept OK. No abd pain, Eating better. No chills or sweats   Objective: Vital signs in last 24 hours: Temp:  [98.1 F (36.7 C)-98.4 F (36.9 C)] 98.4 F (36.9 C) (03/12 0530) Pulse Rate:  [67-78] 67  (03/12 0530) Resp:  [18-20] 20  (03/12 0530) BP: (137-149)/(69-70) 146/69 mmHg (03/12 0530) SpO2:  [98 %-99 %] 98 % (03/12 0530) Weight:  [111.8 kg (246 lb 7.6 oz)] 111.8 kg (246 lb 7.6 oz) (03/12 0500)  Intake/Output from previous day: 03/11 0701 - 03/12 0700 In: 1295 [P.O.:320; I.V.:325; IV Piggyback:650] Out: 4550 [Urine:4550] Intake/Output this shift:    General: alert face symmetric, neck supple, lungs clear, no wheeze, ht IR/IR distant. abd obese NT, extrems 1+ bilat edema. Awake, alert, mentating well   Lab Results   [facility] 05/18/11 0501 05/17/11 0438  WBC 5.3 5.5  RBC 3.03* 2.86*  HGB 9.4* 9.0*  HCT 29.1* 27.7*  MCV 96.0 96.9  MCH 31.0 31.5  RDW 17.2* 17.4*  PLT 194 199    Basename 05/18/11 0501 05/17/11 1355  NA 138 138  K 3.5 3.2*  CL 95* 94*  CO2 39* 40*  GLUCOSE 152* 183*  BUN 16 17  CREATININE 1.15* 1.23*  CALCIUM 8.8 8.9    Studies/Results: No results found.  Scheduled Meds:   . calcium-vitamin D  1 tablet Oral BID  . darifenacin  7.5 mg Oral Daily  . ferrous sulfate  325 mg Oral Q breakfast  . furosemide  40 mg Intravenous Daily  . heparin  5,000 Units Subcutaneous Q8H  . insulin aspart  0-15 Units Subcutaneous TID WC  . insulin aspart  0-5 Units Subcutaneous QHS  . insulin glargine  20 Units Subcutaneous QHS  . losartan  100 mg Oral Daily  . metoprolol  12.5 mg Oral BID  . mulitivitamin with minerals  1 tablet Oral Daily  . pantoprazole  40 mg Oral Daily  . piperacillin-tazobactam (ZOSYN)  IV  3.375 g Intravenous Q8H  . polyethylene glycol  17 g Oral Daily  . potassium chloride  20 mEq Oral Q3H  . protein supplement  6 g Oral TID WC  . rosuvastatin  20 mg Oral Daily  . saccharomyces boulardii  250  mg Oral BID  . sennosides-docusate sodium  2 tablet Oral QHS  . vancomycin  1,500 mg Intravenous Q24H   Continuous Infusions:   . sodium chloride 25 mL/hr at 05/17/11 1712   PRN Meds:bisacodyl, cloNIDine  Assessment/Plan:  RECURRENT UROSEPSIS:Doing better. Narrow therapy. Await cx, if back ? D/C tomorrow  OBTUNDATION: Resolved  ? SEIZURE: EEG was normal BRADYCARDIA:  HR better HYPERTENSION: Doing OK DM 2: BS better at 115 today  HYPOKALEMIA: better ANEMIA: Has gone back up to 9.4 HYPERLIPIDEMIA: On Rx  HYDRONEPHROSIS WITH STENTS: Creat staying pretty good.  @ 1.15 CHF/PULMONARY EDEMA: Doing better clinically after diuresis  PLEURAL EFFUSION: Will watch for now, not enough compromise to need thoracentesis  ATRIAL FIBRILLATION: Rate reasonable overall     LOS: 3 days   Kathryn [name] Kathryn [name] 05/18/2011, 8:09 AM

## 2011-05-19 LAB — COMPREHENSIVE METABOLIC PANEL
ALT: 18 U/L (ref 0–35)
AST: 28 U/L (ref 0–37)
Albumin: 2.3 g/dL — ABNORMAL LOW (ref 3.5–5.2)
CO2: 43 mEq/L (ref 19–32)
Chloride: 94 mEq/L — ABNORMAL LOW (ref 96–112)
GFR calc non Af Amer: 43 mL/min — ABNORMAL LOW (ref >90)
Potassium: 3.4 mEq/L — ABNORMAL LOW (ref 3.5–5.1)
Sodium: 139 mEq/L (ref 135–145)
Total Bilirubin: 0.5 mg/dL (ref 0.3–1.2)

## 2011-05-19 LAB — CBC
MCH: 30.8 pg (ref 26.0–34.0)
MCV: 96.3 fL (ref 78.0–100.0)
RDW: 16.7 % — ABNORMAL HIGH (ref 11.5–15.5)

## 2011-05-19 LAB — GLUCOSE, CAPILLARY: Glucose-Capillary: 134 mg/dL — ABNORMAL HIGH (ref 70–99)

## 2011-05-19 MED ORDER — CIPROFLOXACIN HCL 500 MG PO TABS
500.0000 mg | ORAL_TABLET | Freq: Two times a day (BID) | ORAL | Status: DC
  Administered 2011-05-19: 500 mg via ORAL
  Filled 2011-05-19 (×2): qty 1

## 2011-05-19 MED ORDER — CIPROFLOXACIN HCL 500 MG PO TABS: 500.0000 mg | ORAL_TABLET | Freq: Two times a day (BID) | ORAL | Status: DC

## 2011-05-19 MED ORDER — POTASSIUM CHLORIDE 20 MEQ PO PACK
20.0000 meq | PACK | Freq: Every day | ORAL | Status: DC
  Filled 2011-05-19: qty 1

## 2011-05-19 MED ORDER — POTASSIUM CHLORIDE 20 MEQ PO PACK: 20.0000 meq | PACK | Freq: Every day | ORAL | Status: DC

## 2011-05-19 MED ORDER — POTASSIUM CHLORIDE CRYS ER 20 MEQ PO TBCR: 20.0000 meq | EXTENDED_RELEASE_TABLET | Freq: Every day | ORAL | Status: DC

## 2011-05-19 MED ORDER — METOPROLOL TARTRATE 50 MG PO TABS: 25.0000 mg | ORAL_TABLET | Freq: Two times a day (BID) | ORAL | Status: DC

## 2011-05-19 MED ORDER — METOPROLOL TARTRATE 25 MG PO TABS
25.0000 mg | ORAL_TABLET | Freq: Two times a day (BID) | ORAL | Status: DC
  Administered 2011-05-19: 25 mg via ORAL
  Filled 2011-05-19 (×2): qty 1

## 2011-05-19 MED ORDER — HYDROCODONE-ACETAMINOPHEN 5-325 MG PO TABS
1.0000 | ORAL_TABLET | Freq: Once | ORAL | Status: AC
  Administered 2011-05-19: 1 via ORAL
  Filled 2011-05-19: qty 1

## 2011-05-19 MED ORDER — POTASSIUM CHLORIDE CRYS ER 20 MEQ PO TBCR
20.0000 meq | EXTENDED_RELEASE_TABLET | Freq: Every day | ORAL | Status: DC
  Administered 2011-05-19: 20 meq via ORAL
  Filled 2011-05-19: qty 1

## 2011-05-19 NOTE — Progress Notes (Signed)
CSW working on getting Pt back to [facility] today. D/C summary sent to [facility]. Pt to go via ambulance. Packet in wall. Need med rec completed for AVS to print. CSW to call [facility] when packet and Pt ready.  Vennie Homans, [place] 05/19/2011 11:37 AM [phone]

## 2011-05-19 NOTE — Discharge Summary (Signed)
DISCHARGE SUMMARY  Kathryn [name]  MR#: [id number]  DOB:09/09/1933  Date of Admission: 05/15/2011 Date of Discharge: 05/19/2011  Attending Physician:Riyah Bardon Kathryn [name]  Patient's NWG:Kathryn [name], MD, MD  Consults:Treatment Team:  Valetta Fuller, MD   Discharge Diagnoses: Principal Problem:  *Obtunded Active Problems:  DM  HYPERTENSION, UNSPECIFIED  Atrial fibrillation  Anemia  Obesity  UTI (lower urinary tract infection)  Altered mental state   Discharge Medications: Medication List  As of 05/19/2011  9:28 AM   STOP taking these medications         bisacodyl 10 MG suppository      ondansetron 4 MG tablet      OVER THE COUNTER MEDICATION      sodium chloride 0.9 % SOLN 100 mL with imipenem-cilastatin 500 MG SOLR 500 mg         TAKE these medications         atorvastatin 20 MG tablet   Commonly known as: LIPITOR   Take 20 mg by mouth daily.      Calcium Carbonate-Vitamin D 600-400 MG-UNIT per tablet   Take 1 tablet by mouth 2 (two) times daily.      ciprofloxacin 500 MG tablet   Commonly known as: CIPRO   Take 1 tablet (500 mg total) by mouth 2 (two) times daily.      cloNIDine 0.1 MG tablet   Commonly known as: CATAPRES   Take 0.1 mg by mouth as needed. If BP > 160      diphenhydrAMINE 25 mg capsule   Commonly known as: BENADRYL   Take 25 mg by mouth 2 (two) times daily as needed. For itching.      ferrous sulfate 325 (65 FE) MG tablet   Take 325 mg by mouth daily with breakfast.      heparin 5000 UNIT/ML injection   Inject 1 mL (5,000 Units total) into the skin every 8 (eight) hours.      HYDROcodone-acetaminophen 5-325 MG per tablet   Commonly known as: NORCO   Take 1 tablet by mouth every 6 (six) hours as needed. For pain.      insulin aspart 100 UNIT/ML injection   Commonly known as: novoLOG   Inject 0-15 Units into the skin 3 (three) times daily with meals.      insulin glargine 100 UNIT/ML injection   Commonly known as: LANTUS     Inject 20 Units into the skin at bedtime.      losartan 100 MG tablet   Commonly known as: COZAAR   Take 100 mg by mouth daily.      metoprolol 50 MG tablet   Commonly known as: LOPRESSOR   Take 0.5 tablets (25 mg total) by mouth 2 (two) times daily.      mulitivitamin with minerals Tabs   Take 1 tablet by mouth daily.      pantoprazole 40 MG tablet   Commonly known as: PROTONIX   Take 40 mg by mouth daily.      polyethylene glycol packet   Commonly known as: MIRALAX / GLYCOLAX   Take 17 g by mouth daily.      potassium chloride 20 MEQ packet   Commonly known as: KLOR-CON   Take 20 mEq by mouth daily.      protein supplement 6 g Powd   Commonly known as: RESOURCE BENEPROTEIN   Take 1 scoop (6 g total) by mouth 3 (three) times daily with meals.      saccharomyces boulardii  250 MG capsule   Commonly known as: FLORASTOR   Take 250 mg by mouth 2 (two) times daily.      sennosides-docusate sodium 8.6-50 MG tablet   Commonly known as: SENOKOT-S   Take 2 tablets by mouth at bedtime.      solifenacin 10 MG tablet   Commonly known as: VESICARE   Take 10 mg by mouth daily.      zolpidem 5 MG tablet   Commonly known as: AMBIEN   Take 5 mg by mouth at bedtime as needed. For sleep.            Hospital Procedures: Ct Abdomen Pelvis Wo Contrast  04/25/2011  *RADIOLOGY REPORT*  Clinical Data: Urinary tract infection.  Progressive leukocytosis despite antibiotics.  Recently demonstrated small amount of gas adjacent to the mid right ureter with right greater than left hydronephrosis.  Clinical concern for developing abscess.  CT ABDOMEN AND PELVIS WITHOUT CONTRAST  Technique:  Multidetector CT imaging of the abdomen and pelvis was performed following the standard protocol without intravenous contrast.  Comparison: 04/21/2011.  Findings: Mildly progressive dilatation of the right renal collecting system., currently moderately dilated.  Mild dilatation of the left renal collecting  system with mild progression.  The left ureter is mildly dilated to the level of the urinary bladder with no obstructing calculus seen.  There is a Foley catheter in the bladder with no urine seen in the bladder.  The proximal right ureter is moderately dilated to the level of the mid ureter in the upper pelvis, where the previously demonstrated small amount of gas was seen.  Currently, no gas is seen in that region.  However, there has been interval development of an oval area of mildly increased density in that area, measuring 2.6 x 2.1 cm in maximum dimensions on image number 55.  This measures 63 HU in density on image.  There is minimal progression of adjacent extraluminal fluid.  This includes a fluid collection on the right measuring 5.9 x 3.1 cm in maximum dimensions on image number 48, previously measuring 5.6 x 2.4 cm in maximum dimensions.  The appendix is not visualized and is surgically absent by history. No gastrointestinal abnormalities are seen.  No enlarged lymph nodes.  Atheromatous arterial calcifications, including intrasplenic arteries.  Unremarkable liver.  Mild nodularity of the left adrenal gland measuring near fluid density, compatible with two small adenomas.  Unremarkable right adrenal gland, uterus and left ovary.  The right ovary is mildly enlarged for the age of the patient, measuring 4.2 x 2.4 cm in maximum dimensions on image number 62.  Small bilateral pleural effusions, greater on the left, both increased.  Bilateral lower lobe atelectasis.  Lumbar lower thoracic spine degenerative changes.  IMPRESSION:  1.  Moderate right hydroureteronephrosis to the level of the mid right ureter in the upper pelvis.  There is interval resolution of extraluminal gas in that region with an interval probable small hematoma or abscess and mildly progressive extraureteral fluid. These findings are concerning for obstruction of the mid right ureter with perforation of the ureter with possible associated  infection and developing abscess.  2.  Mild left hydroureteronephrosis to the level of the urinary bladder with mild progression. 3.  Interval suggestion of mild enlargement of the right ovary. This appearance was not previously present.  Therefore, this is most likely representative of increased fluid in this region, rather than true ovarian enlargement. 4.  Status post appendectomy. 5.  Mildly progressive bilateral pleural fluid  with associated mild bilateral lower lobe atelectasis.  Original Report Authenticated By: Darrol Angel, M.D.   Ct Abdomen Pelvis Wo Contrast  04/21/2011  *RADIOLOGY REPORT*  Clinical Data: Evaluate for retroperitoneal gas.  History of abdominal pain and hematuria.  CT ABDOMEN AND PELVIS WITHOUT CONTRAST  Technique:  Multidetector CT imaging of the abdomen and pelvis was performed following the standard protocol without intravenous contrast.  Comparison: CT abdomen 04/19/2011  Findings: The upper abdomen was imaged twice due to excessive patient motion.  Lung bases are clear with exception of small pleural effusions.  Again noted is a small amount of gas adjacent to the mid right ureter.  The amount gas has slightly decreased since the prior examination.  There is mild-moderate right hydroureteronephrosis with soft tissue edema surrounding the mid right ureter and similar to the prior examination. Slightly increased distention of the right renal pelvis compared to the prior examination.  There is a Foley catheter within the urinary bladder.  Again noted is mild dilatation of the left renal collecting system.  There is slightly decreased distention of the left renal collecting system compared to the prior examination.  Stable appearance of the uterus and adnexal structures.  The right adnexal structures are in the same region as the edematous changes around the right ureter.  Extensive edema in the presacral region. There is diffuse mesenteric edema and perirenal edema.  No evidence for  kidney stones.  Limited evaluation of the intra-abdominal solid organs due to the lack of intravenous contrast.  No gross abnormalities in the liver, spleen or pancreatic tissue.  Stable fullness of the adrenal tissue. Coronary arteries are heavily calcified.  Diffuse degenerative bone changes.  IMPRESSION: There is persistent dilatation of the renal collecting systems bilaterally.  There is now slightly increased dilatation of the right compared to the left.  No evidence for obstructing stones.  There is persistent gas in the region of the right mid ureter, but the etiology of the air remains indeterminate.   The edema and inflammatory changes centered around the the right ureter is concerning for an inflammatory or infectious etiology.  Small pleural effusions.  Edematous changes throughout the abdomen and pelvis.  Original Report Authenticated By: Richarda Overlie, M.D.   Ct Abdomen Pelvis Wo Contrast  04/19/2011  *RADIOLOGY REPORT*  Clinical Data:  Nausea.  Vomiting.  Fatigue.  Hydronephrosis. Question stone.  Prior cholecystectomy.  CT ABDOMEN AND PELVIS WITHOUT CONTRAST (CT UROGRAM)  Technique: Contiguous axial images of the abdomen and pelvis without oral or intravenous contrast were obtained.  Comparison: Ultrasound of 04/19/2011 and 10/11/2010.  Prior CT of 02/13/2010.  Findings:  Exam is limited for evaluation of entities other than urinary tract calculi due to lack of oral or intravenous contrast.   Motion degraded evaluation of the lung bases/inferior chest. Cardiomegaly with advanced multivessel coronary artery atherosclerosis.  Trace bilateral pleural effusions.  Possible calcified lesion in the right breast, incompletely imaged on image 1 of series 2.  Minimal exclusion of the liver.  Normal spleen, stomach, pancreas. Cholecystectomy.  No biliary ductal dilatation.  Normal right adrenal gland.  Mild left adrenal thickening without well-defined mass.  Bilateral moderate perirenal edema.  Bilateral renal  cortical thinning.  Moderate left-sided hydroureter throughout.  Mild right- sided hydroureter.  No evidence of ureteric stone.  Both ureters are followed to the level of the bladder, which is not distended.  No retroperitoneal or retrocrural adenopathy.  Colonic diverticulosis.  Normal terminal ileum.  Small bowel loops are  normal in caliber.  There is a lower abdominal upper pelvic extraperitoneal/retroperitoneal edema which is nonspecific.  Foci of gas are seen along the course of the right ureter, including on images 55 - 58 of series 2. These areas are poorly evaluated, secondary to lack of contrast.  The appendix is not identified, and by report is surgically absent.  No pelvic adenopathy.  The bladder is collapsed around a Foley catheter.  Normal uterus, without adnexal mass or significant free pelvic fluid.  Diffuse anasarca. No acute osseous abnormality.  IMPRESSION:  1.  Left greater than right hydroureter.  No evidence of obstructive stone.  This is followed to the level of the bladder, which is collapsed around a Foley catheter.  Cannot exclude debris, hemorrhage, or less likely neoplasm within the bladder causing hydroureter. Cystoscopy or repeat imaging after removal of Foley suggested. 2.  Degraded exam, secondary patient size and lack of oral / IV contrast. 3.  Diffuse retroperitoneal edema with air along the course of the right ureter. Adjacent bowel loops are poorly evaluated secondary to lack of enteric contrast ( i.e. this air could be within bowel loops).  Especially if there are abdominal symptoms to suggest extraluminal bowel air or ureteric injury, further evaluation with contrast enhanced (preferably oral and IV) CT should be considered. The study was made a "call report." 4.  Small bilateral pleural effusions. 5.  Possible calcified lesion within the right breast, indeterminate and incompletely imaged.  Original Report Authenticated By: Consuello Bossier, M.D.   Dg Chest 1 View  05/16/2011   *RADIOLOGY REPORT*  Clinical Data: Hypertension, evaluate for pneumonia.  CHEST - 1 VIEW  Comparison: 05/15/2011  Findings: Cardiomegaly.  Layering left pleural effusion. Associated consolidation.  No pneumothorax.  No acute osseous abnormality.  IMPRESSION: Cardiomegaly.  Layering left pleural effusion with associated consolidation; atelectasis versus pneumonia.  Original Report Authenticated By: Waneta Martins, M.D.   Ct Head Wo Contrast  05/15/2011  *RADIOLOGY REPORT*  Clinical Data: Altered mental status  CT HEAD WITHOUT CONTRAST  Technique:  Contiguous axial images were obtained from the base of the skull through the vertex without contrast.  Comparison: 01/27/2011  Findings: The brain shows generalized atrophy.  There are chronic small vessel changes within the deep white matter.  No sign of acute infarction, mass lesion, hemorrhage, hydrocephalus or extra- axial collection.  The calvarium is unremarkable.  Sinuses are clear.  IMPRESSION: Atrophy and chronic small vessel disease of the hemispheric white matter.  No acute or focal finding.  Original Report Authenticated By: Thomasenia Sales, M.D.   US Renal  04/19/2011  *RADIOLOGY REPORT*  Clinical Data: Worsening renal function and urinary tract infection.  RENAL/URINARY TRACT ULTRASOUND COMPLETE  Comparison:  Renal ultrasound 10/11/2010 and abdominal CT 02/13/2010  Findings:  Right Kidney:  Right kidney measures 11.1 cm in length without hydronephrosis. Normal appearance of the right renal parenchyma. There is mild fullness of the right renal pelvis which could represent an extrarenal pelvis.  Left Kidney:  Left kidney measures 12.4 cm in length with fullness of the left renal collecting system and left renal pelvis. Findings are compatible with hydronephrosis and increased since the exam on 10/11/2010.  Bladder:  There is a Foley catheter within the urinary bladder and the bladder appears to be decompressed.  IMPRESSION: Moderate left hydronephrosis.   The degree of left hydronephrosis has increased since the exam on 10/11/2010.  A ureteral obstruction cannot be excluded and the patient may benefit from a CT  of the abdomen and pelvis for further characterization.  Original Report Authenticated By: Richarda Overlie, M.D.   Dg Chest Portable 1 View  05/15/2011  *RADIOLOGY REPORT*  Clinical Data: Altered mental status.  Hypertension and asthma.  PORTABLE CHEST - 1 VIEW  Comparison: 04/17/2011  Findings: Artifact overlies chest.  The heart is enlarged.  There is pulmonary edema.  There is also volume loss in the left lower lobe.  There may be pleural fluid on the left.  No significant bony finding.  IMPRESSION: Probable congestive heart failure with pulmonary edema.  Volume loss in the left lower lobe that may relate to a left effusion. Left lower lobe pneumonia is not excluded.  Original Report Authenticated By: Thomasenia Sales, M.D.    History of Present Illness: Fever and obtundation Hospital Course: This a 78 YO WF who is a nursing home resident. She had only been back to the SNF for a few days and presented with altered mental status and fever. Workup revealed she had another significant UTI. She had a period where her eyes may have briefly rolled back and a possible seizure was described. No further similar issues have occurred and an EEG was fine. The ativan given for this caused obtundation. She received imipenim up until today and vancomycin until yesterday. Now she is on cipro. She has double J stents in place and will need these addressed in the next 10 days by Dr Isabel Caprice [phone]). Her sugars are doing well. Her BP has been up after we reduced from 50 BID to 12.5 Metoprolol due to bradycardia. Today, I increased back to 25 bid. She is eating well. Her GI issues are doing well. Her urine is clear without hematuria. Her potassium is a bit low Sugars need to be checked QID and the same scale as before seems to be working. Her diet in NCS. Pain has been less  of an issue. She is quite immobile. She is still only in SQ heparin for DVT prophylaxis and for her Afib since she had significant hematuria both on coumadin and then Xarelto. When urologic issues are resolved, this should be readdressed. She had some minor CHF while here  RECURRENT UROSEPSIS: now on cipro alone. Cx needs followup OBTUNDATION: Resolved  ? SEIZURE: EEG was normal , obtundation appears Benzodiazpine related BRADYCARDIA: HR better  HYPERTENSION: Doing fair, now increased from 12.5 back to 25 BID metoprolol, watch HR DM 2: BS better at 115 today  HYPOKALEMIA: a bit low, on Rx ANEMIA: Has gone back up to 9.  HYPERLIPIDEMIA: On Rx  HYDRONEPHROSIS WITH STENTS: Creat staying pretty good. @ 1.120 CHF/PULMONARY EDEMA: Doing better clinically after diuresis  PLEURAL EFFUSION: Will watch for now, not enough compromise to need thoracentesis  ATRIAL FIBRILLATION: Rate reasonable overall ARRHYTHMIA: had a 4 beat run of VT, no symptoms CODE STATUS: full code   Day of Discharge Exam BP 167/81  Pulse 60  Temp(Src) 97.8 F (36.6 C) (Axillary)  Resp 18  Ht 5\' 6"  (1.676 m)  Wt 110.9 kg (244 lb 7.8 oz)  BMI 39.46 kg/m2  SpO2 98%  Physical Exam: General appearance: alert, morbidly obese and pale, lying flat in NAD Eyes: no scleral icterus Throat: oropharynx moist without erythema Resp: clear to auscultation bilaterally, dull on right Cardio: irregularly irregular rhythm GI: large, soft, non-tender; bowel sounds normal; no masses,  no organomegaly Extremities: no clubbing, cyanosis , 1+ edema Neuro: alert,. Awake. mentating well  Discharge Labs:  [facility] 05/19/11 0445 05/18/11 0501  NA 139 138  K 3.4* 3.5  CL 94* 95*  CO2 43* 39*  GLUCOSE 103* 152*  BUN 16 16  CREATININE 1.20* 1.15*  CALCIUM 9.2 8.8  MG -- --  PHOS -- --    Basename 05/19/11 0445 05/18/11 0501  AST 28 36  ALT 18 16  ALKPHOS 103 97  BILITOT 0.5 0.4  PROT 5.6* 5.5*  ALBUMIN 2.3* 2.2*     Basename 05/19/11 0445 05/18/11 0501  WBC 4.9 5.3  NEUTROABS -- --  HGB 9.9* 9.4*  HCT 30.9* 29.1*  MCV 96.3 96.0  PLT 204 194   Lab Results  Component Value Date   INR 1.10* 02/05/2011   INR 1.14 11/15/2010   INR 1.88* 10/17/2010   PROTIME 13.2 02/05/2011   PROTIME 19.7 08/09/2008      Discharge instructions: Discharge Orders    Future Appointments: Provider: Department: Dept Phone: Center:   05/21/2011 3:00 PM Alvina Filbert [facility] [phone] None   05/21/2011 3:30 PM [facility] [id number] None   06/11/2011 3:15 PM Windell Hummingbird [facility] [id number] None   06/11/2011 3:45 PM [facility] [id number] None   07/02/2011 2:30 PM Krista Blue [facility] [id number] None   07/02/2011 3:00 PM Benjiman Core, MD [facility] [phone] None   07/02/2011 3:30 PM [facility] [phone] None      Disposition   Follow-up Appts: Follow-up with Dr. Evlyn Kanner at [facility] after SNF discharge. Call for appointment.  Condition on Discharge improved  Tests Needing Follow-up: the culture needs a followup to make sure Cipro is appropriate   Signed: Avnoor Koury Kathryn [name] 05/19/2011, 9:28 AM

## 2011-05-19 NOTE — Progress Notes (Signed)
Faxed AVS/D/c summary to [facility].  [facility] confirmed receipt of information and stated that they are ready to receive Pt.  Notified RN who was in Pt's room.  Arranged for transportation.  Pt to be d/c'd.  Providence Crosby, LCSWA Clinical Social Work [phone]

## 2011-05-19 NOTE — Progress Notes (Signed)
Pt. Had 4 beat run of v-tach last night 05-18-11 at 1926, pt. Was aysmtomatic and asleep.  Pt. Also had a 8 beat run of PVCs 0435, MD aware no new orders received at this time pt. Is asymtomatic and asleep, current VS: BP 16781 HR60.  Will continue to monitor pt.

## 2011-05-20 LAB — URINE CULTURE

## 2011-05-20 MED FILL — Insulin Aspart Inj 100 Unit/ML: SUBCUTANEOUS | Qty: 0.02 | Status: AC

## 2011-05-21 ENCOUNTER — Other Ambulatory Visit (HOSPITAL_BASED_OUTPATIENT_CLINIC_OR_DEPARTMENT_OTHER): Payer: Medicare Other | Admitting: Lab

## 2011-05-21 ENCOUNTER — Ambulatory Visit: Payer: Medicare Other

## 2011-05-21 DIAGNOSIS — D509 Iron deficiency anemia, unspecified: Secondary | ICD-10-CM

## 2011-05-21 DIAGNOSIS — N039 Chronic nephritic syndrome with unspecified morphologic changes: Secondary | ICD-10-CM

## 2011-05-21 DIAGNOSIS — D649 Anemia, unspecified: Secondary | ICD-10-CM

## 2011-05-21 LAB — CBC WITH DIFFERENTIAL/PLATELET
BASO%: 0.2 % (ref 0.0–2.0)
Eosinophils Absolute: 0.1 10*3/uL (ref 0.0–0.5)
MCHC: 32.9 g/dL (ref 31.5–36.0)
MONO#: 0.6 10*3/uL (ref 0.1–0.9)
NEUT#: 6.2 10*3/uL (ref 1.5–6.5)
RBC: 3.74 10*6/uL (ref 3.70–5.45)
RDW: 17.9 % — ABNORMAL HIGH (ref 11.2–14.5)
WBC: 7.8 10*3/uL (ref 3.9–10.3)
lymph#: 0.8 10*3/uL — ABNORMAL LOW (ref 0.9–3.3)

## 2011-05-21 LAB — CULTURE, BLOOD (ROUTINE X 2): Culture  Setup Time: 201303092234

## 2011-05-27 ENCOUNTER — Other Ambulatory Visit: Payer: Self-pay | Admitting: Urology

## 2011-05-28 ENCOUNTER — Encounter (HOSPITAL_COMMUNITY): Payer: Self-pay | Admitting: Pharmacy Technician

## 2011-05-28 ENCOUNTER — Encounter (HOSPITAL_COMMUNITY): Payer: Self-pay | Admitting: *Deleted

## 2011-05-28 NOTE — Pre-Procedure Instructions (Signed)
20 Kathryn [name]  05/28/2011   Your procedure is scheduled on: 05/31/11  Report to SHORT STAY DEPT  at 7:15 AM. (surgfery is scheduled for 10:00 am)  Call this number if you have problems the morning of surgery: [phone]   Remember:   Do not eat food or drink liquids AFTER MIDNIGHT    Take these medicines the morning of surgery with A SIP OF WATER: PROTONIX / VESICARE / CIPRO / LOPRESSOR / HYDROCODONE IF NEEDED / TAKE 1/2 DOSE OF INSULIN THE NIGHT BEFORE SURGERY   Do not wear jewelry, make-up or nail polish.  Do not wear lotions, powders, or perfumes.   Do not shave legs or underarms 48 hrs. before surgery (men may shave face)  Do not bring valuables to the hospital.  Contacts, dentures or bridgework may not be worn into surgery.  Leave suitcase in the car. After surgery it may be brought to your room.  For patients admitted to the hospital, checkout time is 11:00 AM the day of discharge.               SHOWER WITH BETASEPT THE NIGHT BEFORE SURGERY AND THE MORNING OF SURGERY. DO NOT USE ON HEAD, FACE, OR GENITAL AREA. USE REGULAR SOAP ON THOSE AREAS.

## 2011-05-31 ENCOUNTER — Encounter (HOSPITAL_COMMUNITY): Payer: Self-pay | Admitting: Anesthesiology

## 2011-05-31 ENCOUNTER — Ambulatory Visit (HOSPITAL_COMMUNITY)
Admission: RE | Admit: 2011-05-31 | Discharge: 2011-05-31 | Disposition: A | Discharge status: Skilled Nursing Facility | Payer: Medicare Other | Source: Ambulatory Visit | Attending: Urology | Admitting: Urology

## 2011-05-31 ENCOUNTER — Encounter (HOSPITAL_COMMUNITY)
Admission: RE | Disposition: A | Discharge status: Skilled Nursing Facility | Payer: Self-pay | Source: Ambulatory Visit | Attending: Urology

## 2011-05-31 ENCOUNTER — Encounter (HOSPITAL_COMMUNITY): Payer: Self-pay | Admitting: *Deleted

## 2011-05-31 ENCOUNTER — Ambulatory Visit (HOSPITAL_COMMUNITY): Payer: Medicare Other | Admitting: Anesthesiology

## 2011-05-31 DIAGNOSIS — Z79899 Other long term (current) drug therapy: Secondary | ICD-10-CM | POA: Insufficient documentation

## 2011-05-31 DIAGNOSIS — I10 Essential (primary) hypertension: Secondary | ICD-10-CM | POA: Insufficient documentation

## 2011-05-31 DIAGNOSIS — Z96659 Presence of unspecified artificial knee joint: Secondary | ICD-10-CM | POA: Insufficient documentation

## 2011-05-31 DIAGNOSIS — Z794 Long term (current) use of insulin: Secondary | ICD-10-CM | POA: Insufficient documentation

## 2011-05-31 DIAGNOSIS — E119 Type 2 diabetes mellitus without complications: Secondary | ICD-10-CM | POA: Insufficient documentation

## 2011-05-31 DIAGNOSIS — I4891 Unspecified atrial fibrillation: Secondary | ICD-10-CM | POA: Insufficient documentation

## 2011-05-31 DIAGNOSIS — N135 Crossing vessel and stricture of ureter without hydronephrosis: Secondary | ICD-10-CM | POA: Insufficient documentation

## 2011-05-31 DIAGNOSIS — N3020 Other chronic cystitis without hematuria: Secondary | ICD-10-CM | POA: Insufficient documentation

## 2011-05-31 DIAGNOSIS — R310 Gross hematuria: Secondary | ICD-10-CM | POA: Insufficient documentation

## 2011-05-31 DIAGNOSIS — M810 Age-related osteoporosis without current pathological fracture: Secondary | ICD-10-CM | POA: Insufficient documentation

## 2011-05-31 DIAGNOSIS — N1330 Unspecified hydronephrosis: Secondary | ICD-10-CM | POA: Insufficient documentation

## 2011-05-31 HISTORY — PX: CYSTOSCOPY WITH URETEROSCOPY: SHX5123

## 2011-05-31 LAB — CBC
HCT: 32.6 % — ABNORMAL LOW (ref 36.0–46.0)
MCV: 92.4 fL (ref 78.0–100.0)
RBC: 3.53 MIL/uL — ABNORMAL LOW (ref 3.87–5.11)
RDW: 15.1 % (ref 11.5–15.5)
WBC: 5.8 10*3/uL (ref 4.0–10.5)

## 2011-05-31 LAB — COMPREHENSIVE METABOLIC PANEL
AST: 20 U/L (ref 0–37)
Albumin: 2.8 g/dL — ABNORMAL LOW (ref 3.5–5.2)
Alkaline Phosphatase: 85 U/L (ref 39–117)
BUN: 24 mg/dL — ABNORMAL HIGH (ref 6–23)
CO2: 26 mEq/L (ref 19–32)
Chloride: 98 mEq/L (ref 96–112)
Creatinine, Ser: 1.29 mg/dL — ABNORMAL HIGH (ref 0.50–1.10)
GFR calc non Af Amer: 39 mL/min — ABNORMAL LOW (ref >90)
Potassium: 4.1 mEq/L (ref 3.5–5.1)
Total Bilirubin: 0.6 mg/dL (ref 0.3–1.2)

## 2011-05-31 LAB — GLUCOSE, CAPILLARY

## 2011-05-31 LAB — SURGICAL PCR SCREEN: Staphylococcus aureus: NEGATIVE

## 2011-05-31 SURGERY — CYSTOSCOPY WITH URETEROSCOPY
Anesthesia: General | Site: Ureter | Laterality: Right | Wound class: Clean Contaminated

## 2011-05-31 MED ORDER — ACETAMINOPHEN 10 MG/ML IV SOLN
INTRAVENOUS | Status: DC | PRN
  Administered 2011-05-31: 1000 mg via INTRAVENOUS

## 2011-05-31 MED ORDER — FENTANYL CITRATE 0.05 MG/ML IJ SOLN: 25.0000 ug | INTRAMUSCULAR | Status: DC | PRN

## 2011-05-31 MED ORDER — PROPOFOL 10 MG/ML IV EMUL
INTRAVENOUS | Status: DC | PRN
  Administered 2011-05-31: 200 mg via INTRAVENOUS

## 2011-05-31 MED ORDER — LIDOCAINE HCL (CARDIAC) 20 MG/ML IV SOLN
INTRAVENOUS | Status: DC | PRN
  Administered 2011-05-31: 40 mg via INTRAVENOUS

## 2011-05-31 MED ORDER — SODIUM CHLORIDE 0.9 % IR SOLN
Status: DC | PRN
  Administered 2011-05-31: 3000 mL

## 2011-05-31 MED ORDER — GENTAMICIN IN SALINE 1.6-0.9 MG/ML-% IV SOLN
80.0000 mg | INTRAVENOUS | Status: AC
  Administered 2011-05-31: 80 mg via INTRAVENOUS
  Filled 2011-05-31: qty 50

## 2011-05-31 MED ORDER — ONDANSETRON HCL 4 MG/2ML IJ SOLN
INTRAMUSCULAR | Status: DC | PRN
  Administered 2011-05-31: 2 mg via INTRAVENOUS

## 2011-05-31 MED ORDER — FENTANYL CITRATE 0.05 MG/ML IJ SOLN
INTRAMUSCULAR | Status: DC | PRN
  Administered 2011-05-31 (×2): 50 ug via INTRAVENOUS

## 2011-05-31 MED ORDER — LACTATED RINGERS IV SOLN: INTRAVENOUS | Status: DC

## 2011-05-31 MED ORDER — LACTATED RINGERS IV SOLN
INTRAVENOUS | Status: DC | PRN
  Administered 2011-05-31: 10:00:00 via INTRAVENOUS

## 2011-05-31 MED ORDER — EPHEDRINE SULFATE 50 MG/ML IJ SOLN
INTRAMUSCULAR | Status: DC | PRN
  Administered 2011-05-31: 5 mg via INTRAVENOUS
  Administered 2011-05-31: 10 mg via INTRAVENOUS

## 2011-05-31 SURGICAL SUPPLY — 24 items
ADAPTER CATH URET PLST 4-6FR (CATHETERS) ×2 IMPLANT
ADPR CATH URET STRL DISP 4-6FR (CATHETERS) ×1
BAG URINE DRAINAGE (UROLOGICAL SUPPLIES) ×1 IMPLANT
BAG URO CATCHER STRL LF (DRAPE) ×2 IMPLANT
BASKET STNLS GEMINI 4WIRE 3FR (BASKET) ×1 IMPLANT
BSKT STON RTRVL GEM 120X11 3FR (BASKET) ×1
CATH FOLEY 2WAY SLVR  5CC 18FR (CATHETERS) ×1
CATH FOLEY 2WAY SLVR 5CC 18FR (CATHETERS) IMPLANT
CATH INTERMIT  6FR 70CM (CATHETERS) ×2 IMPLANT
CATH URET 5FR 28IN CONE TIP (BALLOONS)
CATH URET 5FR 70CM CONE TIP (BALLOONS) IMPLANT
CLOTH BEACON ORANGE TIMEOUT ST (SAFETY) ×2 IMPLANT
DRAPE CAMERA CLOSED 9X96 (DRAPES) ×2 IMPLANT
GLOVE BIOGEL M STRL SZ7.5 (GLOVE) ×2 IMPLANT
GOWN PREVENTION PLUS XLARGE (GOWN DISPOSABLE) ×2 IMPLANT
GOWN STRL NON-REIN LRG LVL3 (GOWN DISPOSABLE) ×2 IMPLANT
GOWN STRL REIN XL XLG (GOWN DISPOSABLE) ×2 IMPLANT
GUIDEWIRE STR DUAL SENSOR (WIRE) ×2 IMPLANT
MANIFOLD NEPTUNE II (INSTRUMENTS) ×2 IMPLANT
PACK CYSTO (CUSTOM PROCEDURE TRAY) ×2 IMPLANT
SYR 20CC LL (SYRINGE) ×1 IMPLANT
SYRINGE 10CC LL (SYRINGE) ×1 IMPLANT
TUBING CONNECTING 10 (TUBING) ×2 IMPLANT
WIRE COONS/BENSON .038X145CM (WIRE) ×2 IMPLANT

## 2011-05-31 NOTE — Discharge Instructions (Signed)
Cystoscopy patient instructions  Following a cystoscopy, a catheter (a flexible rubber tube) is sometimes left in place to empty the bladder. This may cause some discomfort or a feeling that you need to urinate. Your doctor determines the period of time that the catheter will be left in place. You may have bloody urine for two to three days (Call your doctor if the amount of bleeding increases or does not subside).  You may pass blood clots in your urine, especially if you had a biopsy. It is not unusual to pass small blood clots and have some bloody urine a couple of weeks after your cystoscopy. Again, call your doctor if the bleeding does not subside. You may have: Dysuria (painful urination) Frequency (urinating often) Urgency (strong desire to urinate)  These symptoms are common especially if medicine is instilled into the bladder or a ureteral stent is placed. Avoiding alcohol and caffeine, such as coffee, tea, and chocolate, may help relieve these symptoms. Drink plenty of water, unless otherwise instructed. Your doctor may also prescribe an antibiotic or other medicine to reduce these symptoms.  Cystoscopy results are available soon after the procedure; biopsy results usually take two to four days. Your doctor will discuss the results of your exam with you. Before you go home, you will be given specific instructions for follow-up care. Special Instructions:  1 If you are going home with a catheter in place do not take a tub bath until removed by your doctor.  2 You may resume your normal activities.  3 Do not drive or operate machinery if you are taking narcotic pain medicine.  4 Be sure to keep all follow-up appointments with your doctor.   5 Call Your Doctor If: The catheter is not draining  You have severe pain  You are unable to urinate  You have a fever over 101  You have severe bleeding          

## 2011-05-31 NOTE — Op Note (Signed)
Preoperative diagnosis: Right hydronephrosis, gross hematuria and chronic cystitis Postoperative diagnosis: Same  Procedure: Cystoscopy with right rigid ureteroscopy, biopsy and right double-J stent removal   Surgeon: Valetta Fuller M.D.  Anesthesia: Gen.  Indications: [name] is 78 years of age. She has had very long-standing problems with recurrent cystitis and gross hematuria. She was recently admitted with urosepsis initial imaging studies suggested some left-sided hydronephrosis but on followup imaging it appeared that the right side was really she developed some renal insufficiency as well. The patient was taken for surgical assessment. She was noted to have a markedly reduced functional bladder capacity with ectopic widemouth ureteral orifices bilaterally. Both ureters reflux but on the right side there appeared to be an obstruction around the level of the mid ureter. The left side showed no evidence of obstruction. On the right side a double-J stent was placed. The patient now presents for further assessment of this right-sided hydronephrosis and mid ureteral obstruction.     Technique and findings: Patient was brought to the operating room where she had successful induction of general anesthesia. She was placed in lithotomy position prepped and draped in usual manner. The patient PAS compression boots and is received perioperative gentamicin. Appropriate surgical timeout was performed. Cystoscopy again revealed a markedly reduced bladder capacity. The bladder mucosa showed some chronic inflammatory changes consistent with chronic cystitis as well as an indwelling Foley catheter which had been removed prior to her procedure. Both ureteral orifices again were ectopic an extremely large. The right-sided double-J stent was seen in good position. With the double-J left in position we were easily able to engage the right distal ureter out with the rigid ureteroscope. We ended performing endoscopy the  entire ureter and I can appreciate no evidence of any intrinsic abnormality or obvious extrinsic compression of her ureter in the mid ureter there was a tiny area of abnormal mucosa which we went ahead and biopsied. Again I can appreciate no definitive evidence of any ongoing obstruction of the ureter at this time. For that reason I went ahead and removed the double-J stent. Whatever mild dilation was appreciated is clearly to be on the basis of some long-standing vesicoureteral reflux. A new Foley catheter was inserted and the urine remained clear. The patient had no obvious complications and was brought to recovery room in stable condition. Cc: Dr. Ardyth Harps

## 2011-05-31 NOTE — H&P (Signed)
Kathryn [name] is an 78 y.o. female.   Chief Complaint:Gross hematruria, hydronephrosis, chronic cystitis HPI: Kathryn [name] is 78 years of age. She has been followed in the office for several years with recurrent cystitis and gross hematuria. Prior assessment has included CT scan with contrast, cystoscopy and urine cytologies which have failed to reveal any significant pathology. The stent was admitted recently with fever and chills and positive urine and blood cultures for Klebsiella. She was initially felt to have some left-sided hydronephrosis on ultrasound. CT scan did raise the concern now retroperitoneal air. Subsequent CT with and contrast did confirm a small amount of retro-peritoneal which totally resolved. The patient was initially felt to have left-sided hydronephrosis but most recent imaging has shown right-sided hydronephrosis. Her creatinine has waxed and waned but has remained elevated and the has not allowed for intravenous contrast studies. The patient is clinically improved with resolution of her abdominal pain, fever and leukocytosis but has continued to have renal insufficiency with intermittent hematuria. She is s/p R JJ stent placement  And is now here for further evaluation of the hydronephrosis with ureteroscopy.   Past Medical History  Diagnosis Date  . Osteoporosis   . DM (diabetes mellitus)   . HTN (hypertension)     unspecified  . Atrial fibrillation   . Macular degeneration   . Osteoarthritis   . Edema   . Anemia   . Hydronephrosis     left  . UTI (lower urinary tract infection)   . Pressure ulcer of back     Past Surgical History  Procedure Date  . Replacement total knee 08/2007    bilateral  . Appendectomy   . Cholecystectomy   . Colonoscopy   . Upper gastrointestinal endoscopy   . Cystoscopy w/ ureteral stent placement 04/29/2011    Procedure: CYSTOSCOPY WITH RETROGRADE PYELOGRAM/URETERAL STENT PLACEMENT;  Surgeon: Valetta Fuller, MD;  Location: [facility];   Service: Urology;  Laterality: Bilateral;  bilateral retrograde, double j stent. Cystogram   . Ureteroscopy 04/29/2011    Procedure: URETEROSCOPY;  Surgeon: Valetta Fuller, MD;  Location: [facility];  Service: Urology;  Laterality: Right;    Family History  Problem Relation Age of Onset  . Diabetes Father   . Atrial fibrillation Father   . Breast cancer Paternal Grandmother   . Heart disease Mother    Social History:  reports that she has never smoked. She has never used smokeless tobacco. She reports that she does not drink alcohol or use illicit drugs.  Allergies:  Allergies  Allergen Reactions  . Celebrex (Celecoxib)   . Nsaids   . Sulfa Antibiotics     Medications Prior to Admission  Medication Dose Route Frequency Provider Last Rate Last Dose  . gentamicin (GARAMYCIN) IVPB 80 mg  80 mg Intravenous 30 min Pre-Op Valetta Fuller, MD       Medications Prior to Admission  Medication Sig Dispense Refill  . atorvastatin (LIPITOR) 20 MG tablet Take 20 mg by mouth at bedtime.       . Calcium Carbonate-Vitamin D 600-400 MG-UNIT per tablet Take 1 tablet by mouth 2 (two) times daily.       . ciprofloxacin (CIPRO) 500 MG tablet Take 500 mg by mouth 2 (two) times daily.      . ferrous sulfate 325 (65 FE) MG tablet Take 325 mg by mouth daily with breakfast.        . HYDROcodone-acetaminophen (NORCO) 5-325 MG per tablet Take 1 tablet  by mouth every 6 (six) hours as needed. For pain.      Marland Kitchen insulin aspart (NOVOLOG) 100 UNIT/ML injection Inject 0-15 Units into the skin 3 (three) times daily with meals.  1 vial  3  . insulin glargine (LANTUS) 100 UNIT/ML injection Inject 20 Units into the skin at bedtime.  10 mL  3  . losartan (COZAAR) 100 MG tablet Take 100 mg by mouth every morning.       . metoprolol (LOPRESSOR) 50 MG tablet Take 50 mg by mouth 2 (two) times daily. Dose increased on 05/26/11      . pantoprazole (PROTONIX) 40 MG tablet Take 40 mg by mouth every morning.       . polyethylene  glycol (MIRALAX / GLYCOLAX) packet Take 17 g by mouth every morning.       . protein supplement (RESOURCE BENEPROTEIN) 6 g POWD Take 1 scoop (6 g total) by mouth 3 (three) times daily with meals.  30 packet  1  . sennosides-docusate sodium (SENOKOT-S) 8.6-50 MG tablet Take 2 tablets by mouth at bedtime.        Marland Kitchen zolpidem (AMBIEN) 5 MG tablet Take 5 mg by mouth at bedtime as needed. For sleep.      . bisacodyl (DULCOLAX) 10 MG suppository Place 1 suppository (10 mg total) rectally daily as needed.  12 suppository  0  . ondansetron (ZOFRAN) 4 MG tablet Take 1 tablet (4 mg total) by mouth every 4 (four) hours as needed.  20 tablet  1    Results for orders placed during the hospital encounter of 05/31/11 (from the past 48 hour(s))  CBC     Status: Abnormal   Collection Time   05/31/11  8:50 AM      Component Value Range Comment   WBC 5.8  4.0 - 10.5 (K/uL)    RBC 3.53 (*) 3.87 - 5.11 (MIL/uL)    Hemoglobin 11.1 (*) 12.0 - 15.0 (g/dL)    HCT 16.1 (*) 09.6 - 46.0 (%)    MCV 92.4  78.0 - 100.0 (fL)    MCH 31.4  26.0 - 34.0 (pg)    MCHC 34.0  30.0 - 36.0 (g/dL)    RDW 04.5  40.9 - 81.1 (%)    Platelets 217  150 - 400 (K/uL)   COMPREHENSIVE METABOLIC PANEL     Status: Abnormal   Collection Time   05/31/11  8:50 AM      Component Value Range Comment   Sodium 132 (*) 135 - 145 (mEq/L)    Potassium 4.1  3.5 - 5.1 (mEq/L)    Chloride 98  96 - 112 (mEq/L)    CO2 26  19 - 32 (mEq/L)    Glucose, Bld 121 (*) 70 - 99 (mg/dL)    BUN 24 (*) 6 - 23 (mg/dL)    Creatinine, Ser 9.14 (*) 0.50 - 1.10 (mg/dL)    Calcium 9.9  8.4 - 10.5 (mg/dL)    Total Protein 6.5  6.0 - 8.3 (g/dL)    Albumin 2.8 (*) 3.5 - 5.2 (g/dL)    AST 20  0 - 37 (U/L)    ALT 13  0 - 35 (U/L)    Alkaline Phosphatase 85  39 - 117 (U/L)    Total Bilirubin 0.6  0.3 - 1.2 (mg/dL)    GFR calc non Af Amer 39 (*) >90 (mL/min)    GFR calc Af Amer 45 (*) >90 (mL/min)   GLUCOSE, CAPILLARY  Status: Abnormal   Collection Time   05/31/11   8:56 AM      Component Value Range Comment   Glucose-Capillary 109 (*) 70 - 99 (mg/dL)    No results found.  Review of Systems - Negative except Fatigue,gross hematuria,difficulty walking, irritative voiding symptoms.  Blood pressure 163/97, pulse 65, temperature 98 F (36.7 C), resp. rate 20, SpO2 95.00%. General appearance: alert, cooperative and no distress Neck: no adenopathy and no JVD Resp: clear to auscultation bilaterally Cardio: Afib rate ok GI: soft,obese, non-tender; bowel sounds normal; no masses,  no organomegaly Extremities: extremities normal, atraumatic, no cyanosis or edema Skin: Skin color, texture, turgor normal. No rashes or lesions Neurologic: Grossly normal  Assessment/Plan For cystoscopy, ureteroscopy and possible JJ removal vs. replacement  Kiril Hippe S 05/31/2011, 9:24 AM

## 2011-05-31 NOTE — Transfer of Care (Signed)
Immediate Anesthesia Transfer of Care Note  Patient: Kathryn [name]  Procedure(s) Performed: Procedure(s) (LRB): CYSTOSCOPY WITH URETEROSCOPY (Right)  Patient Location: PACU  Anesthesia Type: General  Level of Consciousness: sedated  Airway & Oxygen Therapy: Patient Spontanous Breathing and Patient connected to face mask oxygen  Post-op Assessment: Report given to PACU RN and Post -op Vital signs reviewed and stable  Post vital signs: Reviewed and stable  Complications: No apparent anesthesia complications

## 2011-05-31 NOTE — Anesthesia Preprocedure Evaluation (Addendum)
Anesthesia Evaluation  Patient identified by MRN, date of birth, ID band Patient awake    Reviewed: Allergy & Precautions, H&P , NPO status , Patient's Chart, lab work & pertinent test results, reviewed documented beta blocker date and time   Airway Mallampati: II TM Distance: >3 FB Neck ROM: full    Dental  (+) Caps and Dental Advisory Given,    Pulmonary neg pulmonary ROS,  breath sounds clear to auscultation  Pulmonary exam normal       Cardiovascular Exercise Tolerance: Poor hypertension, Pt. on medications and Pt. on home beta blockers + dysrhythmias Atrial Fibrillation Rhythm:Irregular Rate:Normal     Neuro/Psych negative neurological ROS  negative psych ROS   GI/Hepatic negative GI ROS, Neg liver ROS,   Endo/Other  Diabetes mellitus-, Well Controlled, Type 2, Insulin DependentMorbid obesity  Renal/GU negative Renal ROS  negative genitourinary   Musculoskeletal   Abdominal (+) + obese,   Peds  Hematology negative hematology ROS (+)   Anesthesia Other Findings   Reproductive/Obstetrics negative OB ROS                          Anesthesia Physical Anesthesia Plan  ASA: III  Anesthesia Plan: General   Post-op Pain Management:    Induction: Intravenous  Airway Management Planned: LMA  Additional Equipment:   Intra-op Plan:   Post-operative Plan:   Informed Consent: I have reviewed the patients History and Physical, chart, labs and discussed the procedure including the risks, benefits and alternatives for the proposed anesthesia with the patient or authorized representative who has indicated his/her understanding and acceptance.   Dental Advisory Given  Plan Discussed with: CRNA and Surgeon  Anesthesia Plan Comments:         Anesthesia Quick Evaluation

## 2011-05-31 NOTE — Anesthesia Postprocedure Evaluation (Signed)
  Anesthesia Post-op Note  Patient: Kathryn [name]  Procedure(s) Performed: Procedure(s) (LRB): CYSTOSCOPY WITH URETEROSCOPY (Right)  Patient Location: PACU  Anesthesia Type: General  Level of Consciousness: awake and alert   Airway and Oxygen Therapy: Patient Spontanous Breathing  Post-op Pain: mild  Post-op Assessment: Post-op Vital signs reviewed, Patient's Cardiovascular Status Stable, Respiratory Function Stable, Patent Airway and No signs of Nausea or vomiting  Post-op Vital Signs: stable  Complications: No apparent anesthesia complications

## 2011-05-31 NOTE — Progress Notes (Signed)
Medications were reviewed with Marisue Ivan- nurse at [facility]- regarding last dose of meds.

## 2011-06-01 MED FILL — Mupirocin Oint 2%: CUTANEOUS | Qty: 22 | Status: AC

## 2011-06-04 ENCOUNTER — Encounter (HOSPITAL_COMMUNITY): Payer: Self-pay | Admitting: Urology

## 2011-06-09 ENCOUNTER — Other Ambulatory Visit: Payer: Self-pay | Admitting: *Deleted

## 2011-06-11 ENCOUNTER — Ambulatory Visit (HOSPITAL_BASED_OUTPATIENT_CLINIC_OR_DEPARTMENT_OTHER): Payer: Medicare Other

## 2011-06-11 ENCOUNTER — Other Ambulatory Visit (HOSPITAL_BASED_OUTPATIENT_CLINIC_OR_DEPARTMENT_OTHER): Payer: Medicare Other | Admitting: Lab

## 2011-06-11 VITALS — BP 92/63 | HR 74 | Temp 97.7°F

## 2011-06-11 DIAGNOSIS — D649 Anemia, unspecified: Secondary | ICD-10-CM

## 2011-06-11 DIAGNOSIS — N289 Disorder of kidney and ureter, unspecified: Secondary | ICD-10-CM

## 2011-06-11 DIAGNOSIS — D509 Iron deficiency anemia, unspecified: Secondary | ICD-10-CM

## 2011-06-11 LAB — CBC WITH DIFFERENTIAL/PLATELET
BASO%: 0.4 % (ref 0.0–2.0)
Basophils Absolute: 0 10*3/uL (ref 0.0–0.1)
EOS%: 3.7 % (ref 0.0–7.0)
HGB: 10.6 g/dL — ABNORMAL LOW (ref 11.6–15.9)
MCH: 30.7 pg (ref 25.1–34.0)
MCHC: 33.8 g/dL (ref 31.5–36.0)
RDW: 15 % — ABNORMAL HIGH (ref 11.2–14.5)
lymph#: 1 10*3/uL (ref 0.9–3.3)

## 2011-06-11 MED ORDER — DARBEPOETIN ALFA-POLYSORBATE 300 MCG/0.6ML IJ SOLN
300.0000 ug | Freq: Once | INTRAMUSCULAR | Status: AC
  Administered 2011-06-11: 300 ug via SUBCUTANEOUS
  Filled 2011-06-11: qty 0.6

## 2011-06-14 ENCOUNTER — Other Ambulatory Visit: Payer: Self-pay | Admitting: Internal Medicine

## 2011-06-14 DIAGNOSIS — N390 Urinary tract infection, site not specified: Secondary | ICD-10-CM

## 2011-06-15 ENCOUNTER — Other Ambulatory Visit: Payer: Self-pay | Admitting: Internal Medicine

## 2011-06-15 ENCOUNTER — Ambulatory Visit (HOSPITAL_COMMUNITY)
Admission: RE | Admit: 2011-06-15 | Discharge: 2011-06-15 | Disposition: A | Discharge status: Home / Self Care | Payer: Medicare Other | Source: Ambulatory Visit | Attending: Internal Medicine | Admitting: Internal Medicine

## 2011-06-15 DIAGNOSIS — N390 Urinary tract infection, site not specified: Secondary | ICD-10-CM | POA: Insufficient documentation

## 2011-06-15 MED ORDER — LIDOCAINE HCL 1 % IJ SOLN
INTRAMUSCULAR | Status: AC
  Filled 2011-06-15: qty 20

## 2011-06-15 NOTE — Procedures (Signed)
Successful placement of left basilic vein approach 48 cm single lumen PICC line with tip at the superior caval-atrial junction.  The PICC line is ready for immediate use.

## 2011-06-25 ENCOUNTER — Telehealth: Payer: Self-pay | Admitting: Oncology

## 2011-06-25 NOTE — Telephone Encounter (Signed)
Moved 4/26 appts 4/25 due to FS out of office. S/w pt's son re change w/new d/t for 4/25.

## 2011-07-01 ENCOUNTER — Ambulatory Visit (HOSPITAL_BASED_OUTPATIENT_CLINIC_OR_DEPARTMENT_OTHER): Payer: Medicare Other | Admitting: Oncology

## 2011-07-01 ENCOUNTER — Encounter: Payer: Self-pay | Admitting: Oncology

## 2011-07-01 ENCOUNTER — Telehealth: Payer: Self-pay | Admitting: Oncology

## 2011-07-01 ENCOUNTER — Other Ambulatory Visit (HOSPITAL_BASED_OUTPATIENT_CLINIC_OR_DEPARTMENT_OTHER): Payer: Medicare Other | Admitting: Lab

## 2011-07-01 ENCOUNTER — Ambulatory Visit: Payer: Medicare Other

## 2011-07-01 VITALS — BP 133/72 | HR 89 | Temp 97.8°F | Ht 66.0 in | Wt 229.0 lb

## 2011-07-01 DIAGNOSIS — D649 Anemia, unspecified: Secondary | ICD-10-CM

## 2011-07-01 DIAGNOSIS — N289 Disorder of kidney and ureter, unspecified: Secondary | ICD-10-CM

## 2011-07-01 LAB — CBC WITH DIFFERENTIAL/PLATELET
BASO%: 0.6 % (ref 0.0–2.0)
EOS%: 3.2 % (ref 0.0–7.0)
HCT: 33.6 % — ABNORMAL LOW (ref 34.8–46.6)
HGB: 11.4 g/dL — ABNORMAL LOW (ref 11.6–15.9)
MCH: 31.7 pg (ref 25.1–34.0)
MCHC: 33.9 g/dL (ref 31.5–36.0)
MONO#: 0.5 10*3/uL (ref 0.1–0.9)
NEUT%: 76.1 % (ref 38.4–76.8)
RDW: 15.8 % — ABNORMAL HIGH (ref 11.2–14.5)
WBC: 7.2 10*3/uL (ref 3.9–10.3)
lymph#: 1 10*3/uL (ref 0.9–3.3)

## 2011-07-01 NOTE — Progress Notes (Signed)
Hematology and Oncology Follow Up Visit  Kathryn [name] [id number] [birth year] 78 y.o. 07/01/2011 2:46 PM  CC: Kathryn Blinks, MD  Kathryn [name]. Kathryn [name], M.D.  Principle Diagnosis: 78 year old female with normocytic, normochromic anemia. Her anemia is multifactorial due to Fe deficiency as well as renal disease.   Current therapy: Aransep 300 mcg every three weeks. Ferrous sulfate 325 mg daily.  Interim History:  Kathryn [name] presents today for a follow up visit. She is  tolerating Aransep well with out complications. She is also taking po iron without any GI distress. Hematuria has resolved. Treated for UTI with IV antibiotics within the past month. She reports  More energy with aransep at this time. She feels much better today. Denies chest pain, shortness of breath, dyspnea, nausea, vomiting, constipation, and diarrhea. Received one dose of Feraheme in February 2013 and tolerated this well. She remains at the nursing home and tells me that she will likely be there long-term.   Medications: I have reviewed the patient's current medications. Current outpatient prescriptions:atorvastatin (LIPITOR) 20 MG tablet, Take 20 mg by mouth at bedtime. , Disp: , Rfl: ;  Calcium Carbonate-Vitamin D 600-400 MG-UNIT per tablet, Take 1 tablet by mouth 2 (two) times daily. , Disp: , Rfl: ;  cloNIDine (CATAPRES) 0.1 MG tablet, Take 0.1 mg by mouth as needed. If BP > 160, Disp: , Rfl:  diphenhydrAMINE (BENADRYL) 25 mg capsule, Take 25 mg by mouth 2 (two) times daily as needed. For itching., Disp: , Rfl: ;  ferrous sulfate 325 (65 FE) MG tablet, Take 325 mg by mouth daily with breakfast.  , Disp: , Rfl: ;  HYDROcodone-acetaminophen (NORCO) 5-325 MG per tablet, Take 1 tablet by mouth every 6 (six) hours as needed. For pain., Disp: , Rfl:  insulin aspart (NOVOLOG) 100 UNIT/ML injection, Inject 0-15 Units into the skin 3 (three) times daily with meals., Disp: 1 vial, Rfl: 3;  insulin glargine (LANTUS) 100 UNIT/ML injection, Inject  20 Units into the skin at bedtime., Disp: 10 mL, Rfl: 3;  losartan (COZAAR) 100 MG tablet, Take 100 mg by mouth every morning. , Disp: , Rfl:  metoprolol (LOPRESSOR) 50 MG tablet, Take 50 mg by mouth 2 (two) times daily. Dose increased on 05/26/11, Disp: , Rfl: ;  Multiple Vitamin (MULITIVITAMIN WITH MINERALS) TABS, Take 1 tablet by mouth every morning. , Disp: , Rfl: ;  pantoprazole (PROTONIX) 40 MG tablet, Take 40 mg by mouth every morning. , Disp: , Rfl: ;  polyethylene glycol (MIRALAX / GLYCOLAX) packet, Take 17 g by mouth every morning. , Disp: , Rfl:  potassium chloride SA (K-DUR,KLOR-CON) 20 MEQ tablet, Take 1 tablet (20 mEq total) by mouth daily., Disp: 30 tablet, Rfl: 1;  protein supplement (RESOURCE BENEPROTEIN) 6 g POWD, Take 1 scoop (6 g total) by mouth 3 (three) times daily with meals., Disp: 30 packet, Rfl: 1;  saccharomyces boulardii (FLORASTOR) 250 MG capsule, Take 250 mg by mouth 2 (two) times daily., Disp: , Rfl:  sennosides-docusate sodium (SENOKOT-S) 8.6-50 MG tablet, Take 2 tablets by mouth at bedtime.  , Disp: , Rfl: ;  solifenacin (VESICARE) 10 MG tablet, Take 10 mg by mouth every morning. , Disp: , Rfl: ;  zolpidem (AMBIEN) 5 MG tablet, Take 5 mg by mouth at bedtime as needed. For sleep., Disp: , Rfl: ;  DISCONTD: heparin 5000 UNIT/ML injection, Inject 1 mL (5,000 Units total) into the skin every 8 (eight) hours., Disp: 1 mL, Rfl: 3  Allergies:  Allergies  Allergen  Reactions  . Celebrex (Celecoxib)   . Nsaids   . Sulfa Antibiotics     Past Medical History, Surgical history, Social history, and Family History were reviewed and updated.  Review of Systems: Constitutional:  Negative for fever, chills, night sweats, anorexia, weight loss, pain. Cardiovascular: no chest pain or dyspnea on exertion Respiratory: no cough, shortness of breath, or wheezing Neurological: no TIA or stroke symptoms Dermatological: negative ENT: negative Skin: Negative. Gastrointestinal: no abdominal  pain, change in bowel habits, or black or bloody stools Genito-Urinary: no dysuria, trouble voiding, or hematuria Hematological and Lymphatic: negative Breast: negative Musculoskeletal: negative Remaining ROS negative.  Physical Exam: Blood pressure 133/72, pulse 89, temperature 97.8 F (36.6 C), temperature source Oral, height 5\' 6"  (1.676 m), weight 229 lb (103.874 kg). ECOG: 2 General appearance: alert No acute distress. In a wheelchair today.  Head: Normocephalic, without obvious abnormality, atraumatic Neck: no adenopathy, no carotid bruit, no JVD, supple, symmetrical, trachea midline and thyroid not enlarged, symmetric, no tenderness/mass/nodules Lymph nodes: Cervical, supraclavicular, and axillary nodes normal. Heart:regular rate and rhythm, S1, S2 normal, no murmur, click, rub or gallop Lung:chest clear, no wheezing, rales, normal symmetric air entry Abdomin: soft, non-tender, without masses or organomegaly EXT:no erythema, induration, or nodules  Lab Results: Lab Results  Component Value Date   WBC 7.2 07/01/2011   HGB 11.4* 07/01/2011   HCT 33.6* 07/01/2011   MCV 93.3 07/01/2011   PLT 168 07/01/2011     Chemistry      Component Value Date/Time   NA 132* 05/31/2011 0850   K 4.1 05/31/2011 0850   CL 98 05/31/2011 0850   CO2 26 05/31/2011 0850   BUN 24* 05/31/2011 0850   CREATININE 1.29* 05/31/2011 0850      Component Value Date/Time   CALCIUM 9.9 05/31/2011 0850   CALCIUM 8.2* 10/10/2010 1613   ALKPHOS 85 05/31/2011 0850   AST 20 05/31/2011 0850   ALT 13 05/31/2011 0850   BILITOT 0.6 05/31/2011 0850        Impression and Plan: This is a 78 year old female with the following issues: 1.  Normocytic, normochromic anemia. Her anemia is multifactorial due to renal disease and Fe deficiency. On Aranesp and tolerating this well. Hemoglobin is 11.4 today and Aranesp will be held. Plan to check CBC every 3 weeks and give Aranesp to keep her Hgb above 11. She has received 1 dose of  Feraheme and tolerated this well. She remains on ferrous sulfate daily without major side effects.   2. Renal insufficiency: Creatinine is stable in the 1.1-1.3 range. She will follow-up with PCP.  3. Follow-up: Lab every 3 weeks and possible Aranesp injection. She will have a routine visit in about 12 weeks with possible Aranesp injection.  Case reviewed with Dr Clelia Croft.  [place], [place] 4/25/20132:46 PM

## 2011-07-01 NOTE — Telephone Encounter (Signed)
Gv pt appt for [id number] 

## 2011-07-01 NOTE — Progress Notes (Signed)
hgb 11.4 no aranesp indicated. Pt did not arrive to injection room. dmr

## 2011-07-02 ENCOUNTER — Other Ambulatory Visit: Payer: Medicare Other | Admitting: Lab

## 2011-07-02 ENCOUNTER — Ambulatory Visit: Payer: Medicare Other | Admitting: Oncology

## 2011-07-02 ENCOUNTER — Ambulatory Visit: Payer: Medicare Other

## 2011-07-20 ENCOUNTER — Telehealth: Payer: Self-pay | Admitting: *Deleted

## 2011-07-20 NOTE — Telephone Encounter (Signed)
I have called and spoke with the son regarding the apptsa for 6/6.Appt moved earlier due to event.  JMW

## 2011-07-22 ENCOUNTER — Ambulatory Visit: Payer: Medicare Other

## 2011-07-22 ENCOUNTER — Other Ambulatory Visit (HOSPITAL_BASED_OUTPATIENT_CLINIC_OR_DEPARTMENT_OTHER): Payer: Medicare Other | Admitting: Lab

## 2011-07-22 DIAGNOSIS — D649 Anemia, unspecified: Secondary | ICD-10-CM

## 2011-07-22 LAB — CBC WITH DIFFERENTIAL/PLATELET
Basophils Absolute: 0.1 10*3/uL (ref 0.0–0.1)
Eosinophils Absolute: 0.5 10*3/uL (ref 0.0–0.5)
HGB: 11.7 g/dL (ref 11.6–15.9)
MONO#: 0.6 10*3/uL (ref 0.1–0.9)
NEUT#: 6.8 10*3/uL — ABNORMAL HIGH (ref 1.5–6.5)
RBC: 3.65 10*6/uL — ABNORMAL LOW (ref 3.70–5.45)
RDW: 14.6 % — ABNORMAL HIGH (ref 11.2–14.5)
WBC: 9.1 10*3/uL (ref 3.9–10.3)
lymph#: 1.2 10*3/uL (ref 0.9–3.3)
nRBC: 0 % (ref 0–0)

## 2011-07-22 MED ORDER — DARBEPOETIN ALFA-POLYSORBATE 500 MCG/ML IJ SOLN: 300.0000 ug | Freq: Once | INTRAMUSCULAR | Status: DC

## 2011-08-12 ENCOUNTER — Telehealth: Payer: Self-pay | Admitting: *Deleted

## 2011-08-12 ENCOUNTER — Ambulatory Visit: Payer: Medicare Other

## 2011-08-12 ENCOUNTER — Encounter (HOSPITAL_COMMUNITY): Payer: Self-pay | Admitting: Emergency Medicine

## 2011-08-12 ENCOUNTER — Emergency Department (HOSPITAL_COMMUNITY)
Admission: EM | Admit: 2011-08-12 | Discharge: 2011-08-12 | Disposition: A | Discharge status: Skilled Nursing Facility | Payer: Medicare Other | Attending: Emergency Medicine | Admitting: Emergency Medicine

## 2011-08-12 ENCOUNTER — Other Ambulatory Visit: Payer: Medicare Other | Admitting: Lab

## 2011-08-12 DIAGNOSIS — R319 Hematuria, unspecified: Secondary | ICD-10-CM | POA: Insufficient documentation

## 2011-08-12 DIAGNOSIS — R32 Unspecified urinary incontinence: Secondary | ICD-10-CM | POA: Insufficient documentation

## 2011-08-12 DIAGNOSIS — N3289 Other specified disorders of bladder: Secondary | ICD-10-CM | POA: Insufficient documentation

## 2011-08-12 DIAGNOSIS — I10 Essential (primary) hypertension: Secondary | ICD-10-CM | POA: Insufficient documentation

## 2011-08-12 DIAGNOSIS — R300 Dysuria: Secondary | ICD-10-CM | POA: Insufficient documentation

## 2011-08-12 DIAGNOSIS — M810 Age-related osteoporosis without current pathological fracture: Secondary | ICD-10-CM | POA: Insufficient documentation

## 2011-08-12 DIAGNOSIS — E119 Type 2 diabetes mellitus without complications: Secondary | ICD-10-CM | POA: Insufficient documentation

## 2011-08-12 DIAGNOSIS — Z466 Encounter for fitting and adjustment of urinary device: Secondary | ICD-10-CM | POA: Insufficient documentation

## 2011-08-12 DIAGNOSIS — I4891 Unspecified atrial fibrillation: Secondary | ICD-10-CM | POA: Insufficient documentation

## 2011-08-12 DIAGNOSIS — Z96659 Presence of unspecified artificial knee joint: Secondary | ICD-10-CM | POA: Insufficient documentation

## 2011-08-12 DIAGNOSIS — Z79899 Other long term (current) drug therapy: Secondary | ICD-10-CM | POA: Insufficient documentation

## 2011-08-12 LAB — GLUCOSE, CAPILLARY: Glucose-Capillary: 188 mg/dL — ABNORMAL HIGH (ref 70–99)

## 2011-08-12 MED ORDER — OXYBUTYNIN CHLORIDE 5 MG PO TABS
5.0000 mg | ORAL_TABLET | ORAL | Status: AC
  Administered 2011-08-12: 5 mg via ORAL
  Filled 2011-08-12 (×2): qty 1

## 2011-08-12 MED ORDER — OXYBUTYNIN CHLORIDE 5 MG PO TABS: 5.0000 mg | ORAL_TABLET | Freq: Three times a day (TID) | ORAL | Status: DC

## 2011-08-12 MED ORDER — HYDROCODONE-ACETAMINOPHEN 5-325 MG PO TABS
1.0000 | ORAL_TABLET | Freq: Once | ORAL | Status: AC
  Administered 2011-08-12: 1 via ORAL
  Filled 2011-08-12: qty 1

## 2011-08-12 NOTE — ED Notes (Addendum)
With assistance of Tech W. Cheek and Tech DUvaldo Rising we exchanged a 16 for a 20 french foley. 5 cc of saline placed in balloon. Pt reports that this is her 5th foley insertion since Tuesday. Nurse notified.

## 2011-08-12 NOTE — ED Notes (Signed)
Ptar called to transport Pt back to [facility]

## 2011-08-12 NOTE — ED Notes (Signed)
Pt has indwelling foley and is from [facility] facility. This foley has been in x 2 day and is leaking. No distress on arrival

## 2011-08-12 NOTE — ED Notes (Signed)
Pt states her foley has been leaking since Tuesday. This RN checked foley bulb, only 4cc saline in bulb. Fluid removed and 10cc sterile saline placed. Dry brief placed to check for leakage.

## 2011-08-12 NOTE — ED Provider Notes (Addendum)
History     CSN: [id number]  Arrival date & time 08/12/11  1521   First MD Initiated Contact with Patient 08/12/11 1605      Chief Complaint  Patient presents with  . foley leaking     (Consider location/radiation/quality/duration/timing/severity/associated sxs/prior treatment) HPI Comments: Leaking foley since tues.  Had traumatic foley placement and removal x2 on tues and then final placed on tues without difficulty.  Pain in the bladder area since at 5/10.  Good urine output.  Blood initially but has resolved.  The history is provided by the patient.    Past Medical History  Diagnosis Date  . Osteoporosis   . DM (diabetes mellitus)   . HTN (hypertension)     unspecified  . Atrial fibrillation   . Macular degeneration   . Osteoarthritis   . Edema   . Anemia   . Hydronephrosis     left  . UTI (lower urinary tract infection)   . Pressure ulcer of back     Past Surgical History  Procedure Date  . Replacement total knee 08/2007    bilateral  . Appendectomy   . Cholecystectomy   . Colonoscopy   . Upper gastrointestinal endoscopy   . Cystoscopy w/ ureteral stent placement 04/29/2011    Procedure: CYSTOSCOPY WITH RETROGRADE PYELOGRAM/URETERAL STENT PLACEMENT;  Surgeon: Valetta Fuller, MD;  Location: [facility];  Service: Urology;  Laterality: Bilateral;  bilateral retrograde, double j stent. Cystogram   . Ureteroscopy 04/29/2011    Procedure: URETEROSCOPY;  Surgeon: Valetta Fuller, MD;  Location: [facility];  Service: Urology;  Laterality: Right;  . Cystoscopy with ureteroscopy 05/31/2011    Procedure: CYSTOSCOPY WITH URETEROSCOPY;  Surgeon: Valetta Fuller, MD;  Location: [facility];  Service: Urology;  Laterality: Right;  Ureteral biopsy Right      Family History  Problem Relation Age of Onset  . Diabetes Father   . Atrial fibrillation Father   . Breast cancer Paternal Grandmother   . Heart disease Mother     History  Substance Use Topics  . Smoking status: Never Smoker     . Smokeless tobacco: Never Used  . Alcohol Use: No    OB History    Grav Para Term Preterm Abortions TAB SAB Ect Mult Living                  Review of Systems  Constitutional: Negative for fever and chills.  Genitourinary: Positive for dysuria and hematuria.  Neurological: Negative for weakness.  All other systems reviewed and are negative.    Allergies  Celebrex; Nsaids; and Sulfa antibiotics  Home Medications   Current Outpatient Rx  Name Route Sig Dispense Refill  . ATORVASTATIN CALCIUM 20 MG PO TABS Oral Take 20 mg by mouth at bedtime.     Marland Kitchen CALCIUM CARBONATE-VITAMIN D 600-400 MG-UNIT PO TABS Oral Take 1 tablet by mouth 2 (two) times daily.     Marland Kitchen CLONIDINE HCL 0.1 MG PO TABS Oral Take 0.1 mg by mouth as needed. If BP > 160    . FERROUS SULFATE 325 (65 FE) MG PO TABS Oral Take 325 mg by mouth daily with breakfast.      . HYDROCODONE-ACETAMINOPHEN 5-325 MG PO TABS Oral Take 1 tablet by mouth every 6 (six) hours as needed. For pain.    . INSULIN ASPART 100 UNIT/ML [place] SOLN Subcutaneous Inject 5 Units into the skin 3 (three) times daily before meals.    . INSULIN GLARGINE 100  UNIT/ML [place] SOLN Subcutaneous Inject 15 Units into the skin at bedtime.    Marland Kitchen LACTULOSE 10 GM/15ML PO SOLN Oral Take 20 g by mouth at bedtime.    Marland Kitchen LOSARTAN POTASSIUM 100 MG PO TABS Oral Take 100 mg by mouth every morning.     Marland Kitchen MENTHOL 3 MG MT LOZG Oral Take 1 lozenge by mouth every 4 (four) hours as needed. For sore throat.    Marland Kitchen METOPROLOL TARTRATE 50 MG PO TABS Oral Take 50 mg by mouth 2 (two) times daily.     . ADULT MULTIVITAMIN W/MINERALS CH Oral Take 1 tablet by mouth every morning.     Marland Kitchen NITROFURANTOIN MONOHYD MACRO 100 MG PO CAPS Oral Take 100 mg by mouth 2 (two) times daily. 7 day course of therapy; started 08/10/2011    . NUTRITIONAL SUPPLEMENT PO Oral Take 60 mLs by mouth 3 (three) times daily. Medpass Sugar Free.    Marland Kitchen PANTOPRAZOLE SODIUM 40 MG PO TBEC Oral Take 40 mg by mouth every morning.      Marland Kitchen POLYETHYLENE GLYCOL 3350 PO PACK Oral Take 17 g by mouth every morning.     Marland Kitchen PROMETHAZINE HCL 25 MG PO TABS Oral Take 25 mg by mouth every 6 (six) hours as needed. For nausea.    Marland Kitchen SACCHAROMYCES BOULARDII 250 MG PO CAPS Oral Take 250 mg by mouth 2 (two) times daily. 10 day course of therapy; started 08/10/2011    . SENNA-DOCUSATE SODIUM 8.6-50 MG PO TABS Oral Take 2 tablets by mouth at bedtime.      Marland Kitchen SIMETHICONE 80 MG PO CHEW Oral Chew 80 mg by mouth every 6 (six) hours as needed.    Marland Kitchen SOLIFENACIN SUCCINATE 10 MG PO TABS Oral Take 10 mg by mouth every morning.     Marland Kitchen ZOLPIDEM TARTRATE 5 MG PO TABS Oral Take 5 mg by mouth at bedtime as needed. For sleep.      BP 159/69  Pulse 62  Temp 98.5 F (36.9 C)  Resp 16  SpO2 95%  Physical Exam  Nursing note and vitals reviewed. Constitutional: She is oriented to person, place, and time. She appears well-developed and well-nourished. No distress.  HENT:  Head: Normocephalic and atraumatic.  Mouth/Throat: Oropharynx is clear and moist.  Eyes: Conjunctivae and EOM are normal. Pupils are equal, round, and reactive to light.  Neck: Normal range of motion. Neck supple.  Cardiovascular: Normal rate, regular rhythm and intact distal pulses.   Murmur heard. Pulmonary/Chest: Effort normal and breath sounds normal. No respiratory distress. She has no wheezes. She has no rales.  Abdominal: Soft. Normal appearance. She exhibits no distension. There is tenderness in the suprapubic area. There is no rebound, no guarding and no CVA tenderness.  Musculoskeletal: Normal range of motion. She exhibits no edema and no tenderness.  Neurological: She is alert and oriented to person, place, and time.  Skin: Skin is warm and dry. No rash noted. No erythema.  Psychiatric: She has a normal mood and affect. Her behavior is normal.    ED Course  Procedures (including critical care time)  Labs Reviewed - No data to display No results found.   No diagnosis  found.    MDM   Patient is here due to 2 a leaking Foley. She states on Tuesday she had for at least place difficulty. The final Foley was put in Dr. Isabel Caprice on tues.  She has had some blood in the urine due to trauma but that has  cleared and denies any infectious sx.  She returned due to persistent leak.  5:30 PM After one hour Foley is still leaking and will try to replace with a new one.  8:03 PM Is still leaking. Spoke with Dr. Wynelle Link and he states that patient has abnormal bladder pathology and recommended deflating the Foley told to keep the bad and inserting it 1 cm and blowing up to 4 cc. Patient was also placed on anti-spasmodic oxybutynin. Patient will followup with Dr. Isabel Caprice tomorrow if continues to leak.  9:35 PM Pt has had no further leaking and pain has improved.  Will d/c home.   Gwyneth Sprout, MD 08/12/11 2003  Gwyneth Sprout, MD 08/12/11 2136

## 2011-08-12 NOTE — ED Notes (Signed)
Foley catheter advanced further into bladder and bulb checked, no leakage from bulb.

## 2011-08-12 NOTE — ED Notes (Signed)
Pt doing well, son at bedside.

## 2011-08-12 NOTE — Telephone Encounter (Signed)
Spoke with son who said that she is in the Emergency Room at [facility] with catheter problems.

## 2011-08-12 NOTE — ED Notes (Signed)
Pt reports to be feeling wet and said that the nurse just increased bulb in catheter to see if that would stop the leaking. New diaper was also placed to assess for leakage. Pt's newly placed diaper is wet. Nurse notified of the above.

## 2011-09-02 ENCOUNTER — Ambulatory Visit: Payer: Medicare Other

## 2011-09-02 ENCOUNTER — Other Ambulatory Visit: Payer: Medicare Other | Admitting: Lab

## 2011-09-17 ENCOUNTER — Telehealth: Payer: Self-pay | Admitting: Oncology

## 2011-09-17 NOTE — Telephone Encounter (Signed)
[facility] called for the pt  and the pt request that all appts be cx

## 2011-09-23 ENCOUNTER — Ambulatory Visit: Payer: Medicare Other | Admitting: Oncology

## 2011-09-23 ENCOUNTER — Other Ambulatory Visit: Payer: Medicare Other | Admitting: Lab

## 2011-09-23 ENCOUNTER — Ambulatory Visit: Payer: Medicare Other

## 2011-11-06 ENCOUNTER — Encounter (HOSPITAL_COMMUNITY): Payer: Self-pay | Admitting: *Deleted

## 2011-11-06 ENCOUNTER — Emergency Department (HOSPITAL_COMMUNITY)
Admission: EM | Admit: 2011-11-06 | Discharge: 2011-11-06 | Disposition: A | Discharge status: Home / Self Care | Payer: Medicare Other | Attending: Emergency Medicine | Admitting: Emergency Medicine

## 2011-11-06 DIAGNOSIS — Z79899 Other long term (current) drug therapy: Secondary | ICD-10-CM | POA: Insufficient documentation

## 2011-11-06 DIAGNOSIS — T839XXA Unspecified complication of genitourinary prosthetic device, implant and graft, initial encounter: Secondary | ICD-10-CM

## 2011-11-06 DIAGNOSIS — I4891 Unspecified atrial fibrillation: Secondary | ICD-10-CM | POA: Insufficient documentation

## 2011-11-06 DIAGNOSIS — M810 Age-related osteoporosis without current pathological fracture: Secondary | ICD-10-CM | POA: Insufficient documentation

## 2011-11-06 DIAGNOSIS — R339 Retention of urine, unspecified: Secondary | ICD-10-CM | POA: Insufficient documentation

## 2011-11-06 DIAGNOSIS — I10 Essential (primary) hypertension: Secondary | ICD-10-CM | POA: Insufficient documentation

## 2011-11-06 DIAGNOSIS — Z466 Encounter for fitting and adjustment of urinary device: Secondary | ICD-10-CM | POA: Insufficient documentation

## 2011-11-06 DIAGNOSIS — H3530 Unspecified macular degeneration: Secondary | ICD-10-CM | POA: Insufficient documentation

## 2011-11-06 DIAGNOSIS — R32 Unspecified urinary incontinence: Secondary | ICD-10-CM | POA: Insufficient documentation

## 2011-11-06 DIAGNOSIS — Z882 Allergy status to sulfonamides status: Secondary | ICD-10-CM | POA: Insufficient documentation

## 2011-11-06 DIAGNOSIS — E119 Type 2 diabetes mellitus without complications: Secondary | ICD-10-CM | POA: Insufficient documentation

## 2011-11-06 DIAGNOSIS — Z794 Long term (current) use of insulin: Secondary | ICD-10-CM | POA: Insufficient documentation

## 2011-11-06 DIAGNOSIS — Z96659 Presence of unspecified artificial knee joint: Secondary | ICD-10-CM | POA: Insufficient documentation

## 2011-11-06 LAB — GLUCOSE, CAPILLARY: Glucose-Capillary: 187 mg/dL — ABNORMAL HIGH (ref 70–99)

## 2011-11-06 NOTE — ED Notes (Signed)
[id number]<AV> Expected date:<BR> Expected time:<BR> Means of arrival:<BR> Comments:<BR> EMS foley catheter change

## 2011-11-06 NOTE — ED Notes (Signed)
reporrt received at bedside upon arrival shift change,  55 minutes spent in room with MD and 3 assistance to have Dr Lorenso Courier insert 18 french coude with 10 cc NS in balloon.  Pt was cleaned and dry bed linens placed. Pt's blood sugar checked and given cranberry juice per request, pt is total care, lift, turn,  Clean. Pt is alert and oriented in NAD

## 2011-11-06 NOTE — ED Notes (Signed)
Attempted report to [facility] senior care at [phone]

## 2011-11-06 NOTE — ED Notes (Signed)
Communication called for pick up request and pt to be transported back to [facility] room 801-2

## 2011-11-06 NOTE — ED Notes (Signed)
Per PTAT pt is from [facility]. Today pt was scheduled to have foley catheter changed. Catheter removed, 3 unsuccessful attempts to reinsert by 3 different rn. Last attempt catheter inserted, but could not blow up balloon without pt in severe pain. At present pt pain level 7/10 genital area. Alert and oriented x4.

## 2011-11-06 NOTE — ED Notes (Signed)
Attempted to insert foley urine return noted but unable to inflate balloon appropriately resistance felt. Dr. Lorenso Courier notified.

## 2011-11-06 NOTE — ED Provider Notes (Signed)
History     CSN: [id number]  Arrival date & time 11/06/11  1639   First MD Initiated Contact with Patient 11/06/11 1715      Chief Complaint  Patient presents with  . foley catheter insertion problems     (Consider location/radiation/quality/duration/timing/severity/associated sxs/prior treatment) HPI Comments: Kathryn [name] presents from a local nursing facility for evaluation.  Kathryn [name] had a chronic indwelling foley catheter removed this morning but the facility nursing staff were unable to replace the device.  This was a routine scheduled chang-over as the device had been in place over a month.  Kathryn [name] reports a history of urinary retention and incontinence and has had a previous bladder surgery.  Kathryn [name] denies any pain, fever, or ill feeling.  The history is provided by the patient. No language interpreter was used.    Past Medical History  Diagnosis Date  . Osteoporosis   . DM (diabetes mellitus)   . HTN (hypertension)     unspecified  . Atrial fibrillation   . Macular degeneration   . Osteoarthritis   . Edema   . Anemia   . Hydronephrosis     left  . UTI (lower urinary tract infection)   . Pressure ulcer of back     Past Surgical History  Procedure Date  . Replacement total knee 08/2007    bilateral  . Appendectomy   . Cholecystectomy   . Colonoscopy   . Upper gastrointestinal endoscopy   . Cystoscopy w/ ureteral stent placement 04/29/2011    Procedure: CYSTOSCOPY WITH RETROGRADE PYELOGRAM/URETERAL STENT PLACEMENT;  Surgeon: Valetta Fuller, MD;  Location: [facility];  Service: Urology;  Laterality: Bilateral;  bilateral retrograde, double j stent. Cystogram   . Ureteroscopy 04/29/2011    Procedure: URETEROSCOPY;  Surgeon: Valetta Fuller, MD;  Location: [facility];  Service: Urology;  Laterality: Right;  . Cystoscopy with ureteroscopy 05/31/2011    Procedure: CYSTOSCOPY WITH URETEROSCOPY;  Surgeon: Valetta Fuller, MD;  Location: [facility];  Service: Urology;  Laterality: Right;   Ureteral biopsy Right      Family History  Problem Relation Age of Onset  . Diabetes Father   . Atrial fibrillation Father   . Breast cancer Paternal Grandmother   . Heart disease Mother     History  Substance Use Topics  . Smoking status: Never Smoker   . Smokeless tobacco: Never Used  . Alcohol Use: No    OB History    Grav Para Term Preterm Abortions TAB SAB Ect Mult Living                  Review of Systems  Constitutional: Negative for fever, chills, diaphoresis, activity change, appetite change and fatigue.  HENT: Negative for congestion, sore throat, rhinorrhea, trouble swallowing and neck pain.   Eyes: Negative.   Respiratory: Negative for cough, chest tightness and shortness of breath.   Cardiovascular: Negative for chest pain, palpitations and leg swelling.  Gastrointestinal: Negative for nausea, vomiting, diarrhea, constipation and abdominal distention.  Genitourinary: Negative for dysuria, urgency, hematuria, flank pain, decreased urine volume, vaginal bleeding and pelvic pain.  Musculoskeletal: Negative for back pain.  Neurological: Negative for dizziness, speech difficulty, weakness, light-headedness and headaches.  Hematological: Negative.   Psychiatric/Behavioral: Negative.     Allergies  Celebrex; Nsaids; and Sulfa antibiotics  Home Medications   Current Outpatient Rx  Name Route Sig Dispense Refill  . ATORVASTATIN CALCIUM 20 MG PO TABS Oral Take 20 mg by mouth at bedtime.     Marland Kitchen  CALCIUM CARBONATE-VITAMIN D 600-400 MG-UNIT PO TABS Oral Take 1 tablet by mouth 2 (two) times daily.     Marland Kitchen CLONIDINE HCL 0.1 MG PO TABS Oral Take 0.1 mg by mouth as needed. If BP > 160    . FERROUS SULFATE 325 (65 FE) MG PO TABS Oral Take 325 mg by mouth daily with breakfast.      . HYDROCODONE-ACETAMINOPHEN 5-325 MG PO TABS Oral Take 1 tablet by mouth every 6 (six) hours as needed. For pain.    . INSULIN ASPART 100 UNIT/ML [place] SOLN Subcutaneous Inject 5 Units into the skin  3 (three) times daily before meals.    . INSULIN GLARGINE 100 UNIT/ML [place] SOLN Subcutaneous Inject 15 Units into the skin at bedtime.    Marland Kitchen LACTULOSE 10 GM/15ML PO SOLN Oral Take 20 g by mouth at bedtime.    Marland Kitchen LOSARTAN POTASSIUM 100 MG PO TABS Oral Take 100 mg by mouth every morning.     Marland Kitchen MENTHOL 3 MG MT LOZG Oral Take 1 lozenge by mouth every 4 (four) hours as needed. For sore throat.    Marland Kitchen METOPROLOL TARTRATE 50 MG PO TABS Oral Take 50 mg by mouth 2 (two) times daily.     . ADULT MULTIVITAMIN W/MINERALS CH Oral Take 1 tablet by mouth every morning.     Marland Kitchen NITROFURANTOIN MONOHYD MACRO 100 MG PO CAPS Oral Take 100 mg by mouth 2 (two) times daily. 7 day course of therapy; started 08/10/2011    . NUTRITIONAL SUPPLEMENT PO Oral Take 60 mLs by mouth 3 (three) times daily. Medpass Sugar Free.    . OXYBUTYNIN CHLORIDE 5 MG PO TABS Oral Take 1 tablet (5 mg total) by mouth 3 (three) times daily. 60 tablet 0  . PANTOPRAZOLE SODIUM 40 MG PO TBEC Oral Take 40 mg by mouth every morning.     Marland Kitchen POLYETHYLENE GLYCOL 3350 PO PACK Oral Take 17 g by mouth every morning.     Marland Kitchen PROMETHAZINE HCL 25 MG PO TABS Oral Take 25 mg by mouth every 6 (six) hours as needed. For nausea.    Marland Kitchen SACCHAROMYCES BOULARDII 250 MG PO CAPS Oral Take 250 mg by mouth 2 (two) times daily. 10 day course of therapy; started 08/10/2011    . SENNA-DOCUSATE SODIUM 8.6-50 MG PO TABS Oral Take 2 tablets by mouth at bedtime.      Marland Kitchen SIMETHICONE 80 MG PO CHEW Oral Chew 80 mg by mouth every 6 (six) hours as needed.    Marland Kitchen SOLIFENACIN SUCCINATE 10 MG PO TABS Oral Take 10 mg by mouth every morning.     Marland Kitchen ZOLPIDEM TARTRATE 5 MG PO TABS Oral Take 5 mg by mouth at bedtime as needed. For sleep.      BP 158/74  Pulse 60  Temp 98.2 F (36.8 C) (Oral)  Resp 16  SpO2 98%  Physical Exam  Nursing note and vitals reviewed. Constitutional: Kathryn [name] is oriented to person, place, and time. Kathryn [name] appears well-developed and well-nourished. No distress.  HENT:  Head:  Normocephalic and atraumatic.  Right Ear: External ear normal.  Left Ear: External ear normal.  Nose: Nose normal.  Mouth/Throat: Oropharynx is clear and moist. No oropharyngeal exudate.  Eyes: Conjunctivae and EOM are normal. Pupils are equal, round, and reactive to light. Right eye exhibits no discharge. Left eye exhibits no discharge. No scleral icterus.  Neck: Neck supple. No JVD present. No tracheal deviation present. No thyromegaly present.  Cardiovascular: Normal rate, regular rhythm, normal  heart sounds and intact distal pulses.  Exam reveals no gallop.   No murmur heard. Pulmonary/Chest: Breath sounds normal. No stridor. No respiratory distress. Kathryn [name] has no wheezes. Kathryn [name] has no rales. Kathryn [name] exhibits no tenderness.  Abdominal: Soft. Bowel sounds are normal. Kathryn [name] exhibits no distension and no mass. There is no tenderness. There is no rebound and no guarding.  Musculoskeletal: Normal range of motion. Kathryn [name] exhibits edema. Kathryn [name] exhibits no tenderness.  Lymphadenopathy:    Kathryn [name] has no cervical adenopathy.  Neurological: Kathryn [name] is alert and oriented to person, place, and time. No cranial nerve deficit. Coordination normal.  Skin: Skin is warm and dry. No rash noted. Kathryn [name] is not diaphoretic. No erythema. No pallor.  Psychiatric: Kathryn [name] has a normal mood and affect. Her behavior is normal.    ED Course  Procedures (including critical care time)  Labs Reviewed - No data to display No results found.   No diagnosis found.    MDM  Pt presents for placement of a foley catheter.  Kathryn [name] appears nontoxic, note stable VS, NAD.  Plan attempt catheter placement.  If successful, will d/c back to facility.   2030.  Pt stable.  Placed an 18 FR foley catheter on 2nd attempt using maximal sterile technique and drapes.  Initial attemp unsuccessful secondary to poor positioning. Nursing also attempted but was unable to place the catheter.  Noted good return of clear urine and no resistance prior to inflation of the  balloon.  Pt tolerated procedure well, plan return to nursing facility.     Tobin Chad, MD 11/06/11 2052

## 2012-04-11 ENCOUNTER — Other Ambulatory Visit: Payer: Self-pay | Admitting: Internal Medicine

## 2012-04-11 DIAGNOSIS — R29898 Other symptoms and signs involving the musculoskeletal system: Secondary | ICD-10-CM

## 2012-04-13 ENCOUNTER — Other Ambulatory Visit: Payer: Medicare Other

## 2012-06-07 ENCOUNTER — Non-Acute Institutional Stay (SKILLED_NURSING_FACILITY): Payer: Medicare Other | Admitting: Adult Health

## 2012-06-07 DIAGNOSIS — R52 Pain, unspecified: Secondary | ICD-10-CM

## 2012-06-08 ENCOUNTER — Other Ambulatory Visit: Payer: Self-pay | Admitting: *Deleted

## 2012-06-08 MED ORDER — HYDROCODONE-ACETAMINOPHEN 5-325 MG PO TABS: ORAL_TABLET | ORAL | Status: DC

## 2012-06-16 ENCOUNTER — Encounter: Payer: Self-pay | Admitting: Adult Health

## 2012-06-16 NOTE — Progress Notes (Signed)
  Subjective:    Patient ID: Kathryn [name], female    DOB: [birth year], 79 y.o.   MRN: [id number]  HPI This is a 79 year old female who complains of generalized body pain. It was noted that patient has been  Requesting for pain medications twice a day routinely. She prefers to stay in bed everyday.   Review of Systems  Constitutional: Negative.   HENT: Negative.   Eyes: Negative.   Respiratory: Negative for cough and shortness of breath.   Cardiovascular: Negative for palpitations and leg swelling.  Gastrointestinal: Negative.   Genitourinary: Negative.   Neurological: Negative.   Hematological: Negative for adenopathy. Does not bruise/bleed easily.  Psychiatric/Behavioral: Negative.        Objective:   Physical Exam  Nursing note and vitals reviewed. Constitutional: She is oriented to person, place, and time. She appears well-developed and well-nourished.  HENT:  Head: Normocephalic.  Right Ear: External ear normal.  Left Ear: External ear normal.  Eyes: Conjunctivae are normal. Pupils are equal, round, and reactive to light.  Neck: Normal range of motion. Neck supple. No thyromegaly present.  Cardiovascular:  HR irregular  Pulmonary/Chest: Effort normal and breath sounds normal.  Abdominal: Soft. Bowel sounds are normal. She exhibits no distension.  Musculoskeletal: She exhibits no edema and no tenderness.  Neurological: She is alert and oriented to person, place, and time.  Skin: Skin is warm and dry.  Psychiatric: She has a normal mood and affect. Her behavior is normal. Judgment and thought content normal.   LABS: 1/14  Lipid profile nl 12/13  Prealbumin 16.2  hgbA1c 6.1       Assessment & Plan:  Pain, generalized - start Norco 5/325 mg 1 tab PO Q 6AM and 9 PM

## 2012-06-28 ENCOUNTER — Non-Acute Institutional Stay (SKILLED_NURSING_FACILITY): Payer: Medicare Other | Admitting: Internal Medicine

## 2012-06-28 DIAGNOSIS — I4891 Unspecified atrial fibrillation: Secondary | ICD-10-CM

## 2012-06-28 DIAGNOSIS — E7800 Pure hypercholesterolemia, unspecified: Secondary | ICD-10-CM

## 2012-06-28 DIAGNOSIS — I10 Essential (primary) hypertension: Secondary | ICD-10-CM

## 2012-06-28 DIAGNOSIS — E119 Type 2 diabetes mellitus without complications: Secondary | ICD-10-CM

## 2012-07-10 NOTE — Progress Notes (Signed)
Patient ID: Kathryn [name], female   DOB: [birth year], 79 y.o.   MRN: [id number]        PROGRESS NOTE  DATE:  06/28/2012  FACILITY: [facility]   LEVEL OF CARE: SNF  Routine Visit  CHIEF COMPLAINT:  Manage atrial fibrillation, hyperlipidemia and diabetes mellitus.   HISTORY OF PRESENT ILLNESS:  REASSESSMENT OF ONGOING PROBLEM(S):  DM:pt's DM remains stable.  Pt denies polyuria, polydipsia, polyphagia, changes in vision or hypoglycemic episodes.  No complications noted from the medication presently being used.  Last hemoglobin A1c is: 02/2012:  Hemoglobin A1C 6.1.  HYPERLIPIDEMIA: No complications from the medications presently being used. Last fasting lipid panel showed : 03/2012:  Fasting lipid panel normal.   ATRIAL FIBRILLATION: the patients atrial fibrillation remains stable.  The patient denies DOE, tachycardia, orthopnea, transient neurological sx, pedal edema, palpitations, & PNDs.  No complications noted from the medications currently being used.   PAST MEDICAL HISTORY : Reviewed.  No changes.  CURRENT MEDICATIONS: Reviewed per [facility]  REVIEW OF SYSTEMS:  GENERAL: no change in appetite, no fatigue, no weight changes, no fever, chills or weakness RESPIRATORY: no cough, SOB, DOE, wheezing, hemoptysis CARDIAC: no chest pain, edema or palpitations GI: no abdominal pain, diarrhea, constipation, heart burn, nausea or vomiting  PHYSICAL EXAMINATION  VS:  T 98.4      P 50     RR 20      BP 120/58   POX %     WT (Lb)  GENERAL: no acute distress, morbidly obese body habitus EYES: conjunctivae normal, sclerae normal, normal eye lids NECK: supple, trachea midline, no neck masses, no thyroid tenderness, no thyromegaly LYMPHATICS: no LAN in the neck, no supraclavicular LAN RESPIRATORY: breathing is even & unlabored, BS CTAB CARDIAC: RRR, no murmur,no extra heart sounds, no edema GI: abdomen soft, normal BS, no masses, no tenderness, no hepatomegaly, no splenomegaly PSYCHIATRIC:  the patient is alert & oriented to person, affect & behavior appropriate  LABS/RADIOLOGY: 12/2011:  Hemoglobin 9.9, MCV 89.9, otherwise CBC normal.   11/2011:  BUN 35, creatinine 1.56, otherwise BMP normal.   BUN 38, creatinine 1.81, glucose 109, total protein 5.5, albumin 3, otherwise CMP normal.    Vitamin B12 level 601, folate greater than 20, ferritin 183.    ASSESSMENT/PLAN:  Diabetes mellitus.  Well controlled.     Hyperlipidemia.  Well controlled.    Atrial fibrillation.  Rate controlled.    Hypertension.  Well controlled.   Anemia of chronic disease.  Stable.  Continue iron.   GERD.  Well controlled.     Constipation.  Well controlled.    Urinary incontinence.  Continue oxybutynin.    Insomnia.  Continue Ambien.  No complaints.     CPT CODE: 78295

## 2012-07-17 ENCOUNTER — Other Ambulatory Visit: Payer: Self-pay | Admitting: *Deleted

## 2012-07-17 MED ORDER — HYDROCODONE-ACETAMINOPHEN 5-325 MG PO TABS: ORAL_TABLET | ORAL | Status: DC

## 2012-07-26 ENCOUNTER — Non-Acute Institutional Stay (SKILLED_NURSING_FACILITY): Payer: Medicare Other | Admitting: Internal Medicine

## 2012-07-26 DIAGNOSIS — I4891 Unspecified atrial fibrillation: Secondary | ICD-10-CM

## 2012-07-26 DIAGNOSIS — I10 Essential (primary) hypertension: Secondary | ICD-10-CM

## 2012-07-26 DIAGNOSIS — E119 Type 2 diabetes mellitus without complications: Secondary | ICD-10-CM

## 2012-07-26 DIAGNOSIS — E7800 Pure hypercholesterolemia, unspecified: Secondary | ICD-10-CM

## 2012-07-29 NOTE — Progress Notes (Signed)
Patient ID: Kathryn [name], female   DOB: 04/14/1933, 79 y.o.   MRN: [id number]        PROGRESS NOTE  DATE:  07/26/2012  FACILITY: [facility]   LEVEL OF CARE: SNF  Routine Visit  CHIEF COMPLAINT:  Manage atrial fibrillation, hyperlipidemia and diabetes mellitus.   HISTORY OF PRESENT ILLNESS:  REASSESSMENT OF ONGOING PROBLEM(S):  DM:pt's DM remains stable.  Pt denies polyuria, polydipsia, polyphagia, changes in vision or hypoglycemic episodes.  No complications noted from the medication presently being used.  Last hemoglobin A1c is: 02/2012:  Hemoglobin A1C 6.1.  HYPERLIPIDEMIA: No complications from the medications presently being used. Last fasting lipid panel showed : 03/2012:  Fasting lipid panel normal.   ATRIAL FIBRILLATION: the patients atrial fibrillation remains stable.  The patient denies DOE, tachycardia, orthopnea, transient neurological sx, pedal edema, palpitations, & PNDs.  No complications noted from the medications currently being used.   PAST MEDICAL HISTORY : Reviewed.  No changes.  CURRENT MEDICATIONS: Reviewed per [facility]  REVIEW OF SYSTEMS:  GENERAL: no change in appetite, no fatigue, no weight changes, no fever, chills or weakness RESPIRATORY: no cough, SOB, DOE, wheezing, hemoptysis CARDIAC: no chest pain, edema or palpitations GI: no abdominal pain, diarrhea, constipation, heart burn, nausea or vomiting  PHYSICAL EXAMINATION  VS:  T 98.4      P 58     RR 20      BP 134/65   POX %     WT (Lb)  GENERAL: no acute distress, morbidly obese body habitus NECK: supple, trachea midline, no neck masses, no thyroid tenderness, no thyromegaly RESPIRATORY: breathing is even & unlabored, BS CTAB CARDIAC: RRR, no murmur,no extra heart sounds, no edema GI: abdomen soft, normal BS, no masses, no tenderness, no hepatomegaly, no splenomegaly PSYCHIATRIC: the patient is alert & oriented to person, affect & behavior appropriate  LABS/RADIOLOGY: 12/2011:  Hemoglobin 9.9,  MCV 89.9, otherwise CBC normal.   11/2011:  BUN 35, creatinine 1.56, otherwise BMP normal.   BUN 38, creatinine 1.81, glucose 109, total protein 5.5, albumin 3, otherwise CMP normal.    Vitamin B12 level 601, folate greater than 20, ferritin 183.    ASSESSMENT/PLAN:  Diabetes mellitus.  Well controlled.     Hyperlipidemia.  Well controlled.    Atrial fibrillation.  Rate controlled.    Hypertension.  Well controlled.   Anemia of chronic disease.  Stable.  Continue iron.   GERD.  Well controlled.     Constipation.  Well controlled.    Urinary incontinence.  Continue oxybutynin.    Insomnia.  Continue Ambien.  No complaints.    CPT CODE: 40981

## 2012-08-15 ENCOUNTER — Other Ambulatory Visit: Payer: Self-pay | Admitting: *Deleted

## 2012-08-15 MED ORDER — HYDROCODONE-ACETAMINOPHEN 5-325 MG PO TABS: ORAL_TABLET | ORAL | Status: DC

## 2012-09-22 ENCOUNTER — Non-Acute Institutional Stay (SKILLED_NURSING_FACILITY): Payer: Medicare Other | Admitting: Adult Health

## 2012-09-22 DIAGNOSIS — R32 Unspecified urinary incontinence: Secondary | ICD-10-CM

## 2012-09-22 DIAGNOSIS — D638 Anemia in other chronic diseases classified elsewhere: Secondary | ICD-10-CM

## 2012-09-22 DIAGNOSIS — E7800 Pure hypercholesterolemia, unspecified: Secondary | ICD-10-CM

## 2012-09-22 DIAGNOSIS — G4700 Insomnia, unspecified: Secondary | ICD-10-CM

## 2012-09-22 DIAGNOSIS — I4891 Unspecified atrial fibrillation: Secondary | ICD-10-CM

## 2012-09-22 DIAGNOSIS — I10 Essential (primary) hypertension: Secondary | ICD-10-CM

## 2012-09-22 DIAGNOSIS — E119 Type 2 diabetes mellitus without complications: Secondary | ICD-10-CM

## 2012-09-22 DIAGNOSIS — K5900 Constipation, unspecified: Secondary | ICD-10-CM

## 2012-09-22 DIAGNOSIS — K219 Gastro-esophageal reflux disease without esophagitis: Secondary | ICD-10-CM

## 2012-10-02 ENCOUNTER — Other Ambulatory Visit: Payer: Self-pay | Admitting: Geriatric Medicine

## 2012-10-02 MED ORDER — ZOLPIDEM TARTRATE 5 MG PO TABS: 5.0000 mg | ORAL_TABLET | Freq: Every day | ORAL | Status: DC

## 2012-10-05 ENCOUNTER — Encounter: Payer: Self-pay | Admitting: Adult Health

## 2012-10-05 DIAGNOSIS — D638 Anemia in other chronic diseases classified elsewhere: Secondary | ICD-10-CM | POA: Insufficient documentation

## 2012-10-05 DIAGNOSIS — K219 Gastro-esophageal reflux disease without esophagitis: Secondary | ICD-10-CM | POA: Insufficient documentation

## 2012-10-05 DIAGNOSIS — R32 Unspecified urinary incontinence: Secondary | ICD-10-CM | POA: Insufficient documentation

## 2012-10-05 DIAGNOSIS — K5900 Constipation, unspecified: Secondary | ICD-10-CM | POA: Insufficient documentation

## 2012-10-05 DIAGNOSIS — G4700 Insomnia, unspecified: Secondary | ICD-10-CM | POA: Insufficient documentation

## 2012-10-05 NOTE — Progress Notes (Signed)
Patient ID: Kathryn [name], female   DOB: 1933-09-12, 79 y.o.   MRN: [id number]        PROGRESS NOTE  DATE:  09/22/12  FACILITY: [facility]   LEVEL OF CARE: SNF  Routine Visit  CHIEF COMPLAINT:  Manage atrial fibrillation, hyperlipidemia and diabetes mellitus.   HISTORY OF PRESENT ILLNESS:  REASSESSMENT OF ONGOING PROBLEM(S):  HTN: Pt 's HTN remains stable.  Denies CP, sob, DOE, pedal edema, headaches, dizziness or visual disturbances.  No complications from the medications currently being used.  Last BP : 122/73  ATRIAL FIBRILLATION: the patients atrial fibrillation remains stable.  The patient denies DOE, tachycardia, orthopnea, transient neurological sx, pedal edema, palpitations, & PNDs.  No complications noted from the medications currently being used.   URINARY INCONTINENCE: The urinary incontinence remains stable.  Patient denies worsening urgency, leakage or nocturia.  No complications reported from the medication currently being used.  PAST MEDICAL HISTORY : Reviewed.  No changes.  CURRENT MEDICATIONS: Reviewed per [facility]  REVIEW OF SYSTEMS:  GENERAL: no change in appetite, no fatigue, no weight changes, no fever, chills or weakness RESPIRATORY: no cough, SOB, DOE, wheezing, hemoptysis CARDIAC: no chest pain, edema or palpitations GI: no abdominal pain, diarrhea, constipation, heart burn, nausea or vomiting  PHYSICAL EXAMINATION  VS:  T 97.6     P 66    RR 20      BP 122/73        WT 207.1 (Lb)  GENERAL: no acute distress, morbidly obese body habitus EYES:  Conjunctivae normal, sclerae normal, normal eyelids  NECK: supple, trachea midline, no neck masses, no thyroid tenderness, no thyromegaly RESPIRATORY: breathing is even & unlabored, BS CTAB CARDIAC: RRR, no murmur,no extra heart sounds, no edema GI: abdomen soft, normal BS, no masses, no tenderness, no hepatomegaly, no splenomegaly PSYCHIATRIC: the patient is alert & oriented to person, affect & behavior  appropriate  LABS/RADIOLOGY: 60/14 hemoglobin A1c 5.8 5/14 WBC 15.7 hemoglobin 11.8 hematocrit 32.4 sodium 136 potassium 3.6 glucose 161 BUN 27 creatinine 1.13 calcium 9.6  1/14 lipid profile normal 12/2011:  Hemoglobin 9.9, MCV 89.9, otherwise CBC normal.   11/2011:  BUN 35, creatinine 1.56, otherwise BMP normal.   BUN 38, creatinine 1.81, glucose 109, total protein 5.5, albumin 3, otherwise CMP normal.    Vitamin B12 level 601, folate greater than 20, ferritin 183.    ASSESSMENT/PLAN:  Diabetes mellitus.  Well controlled.     Hyperlipidemia.  Well controlled.    Atrial fibrillation.  Rate controlled.    Hypertension.  Well controlled.   Anemia of chronic disease.  Stable.     GERD.  Stable     Constipation.  Stable.    Urinary incontinence.  Stable.    Insomnia.    No complaints.    CPT CODE: 78295

## 2012-11-02 ENCOUNTER — Non-Acute Institutional Stay (SKILLED_NURSING_FACILITY): Payer: Medicare Other | Admitting: Internal Medicine

## 2012-11-02 DIAGNOSIS — I4891 Unspecified atrial fibrillation: Secondary | ICD-10-CM

## 2012-11-02 DIAGNOSIS — E7800 Pure hypercholesterolemia, unspecified: Secondary | ICD-10-CM

## 2012-11-02 DIAGNOSIS — E119 Type 2 diabetes mellitus without complications: Secondary | ICD-10-CM

## 2012-11-02 DIAGNOSIS — D7289 Other specified disorders of white blood cells: Secondary | ICD-10-CM

## 2012-11-02 NOTE — Progress Notes (Signed)
Patient ID: Kathryn [name], female   DOB: March 30, 1933, 79 y.o.   MRN: [id number]        PROGRESS NOTE  DATE:  11/02/2012  FACILITY: [facility]   LEVEL OF CARE: SNF  Routine Visit  CHIEF COMPLAINT:  Manage atrial fibrillation, hyperlipidemia and diabetes mellitus.   HISTORY OF PRESENT ILLNESS:  REASSESSMENT OF ONGOING PROBLEM(S):  DM:pt's DM remains stable.  Pt denies polyuria, polydipsia, polyphagia, changes in vision or hypoglycemic episodes.  No complications noted from the medication presently being used.  Last hemoglobin A1c is: 02/2012:  Hemoglobin A1C 6.1, in 6/14 hemoglobin A1c 6.8  HYPERLIPIDEMIA: No complications from the medications presently being used. Last fasting lipid panel showed : 03/2012:  Fasting lipid panel normal.   ATRIAL FIBRILLATION: the patients atrial fibrillation remains stable.  The patient denies DOE, tachycardia, orthopnea, transient neurological sx, pedal edema, palpitations, & PNDs.  No complications noted from the medications currently being used.   PAST MEDICAL HISTORY : Reviewed.  No changes.  CURRENT MEDICATIONS: Reviewed per [facility]  REVIEW OF SYSTEMS:  GENERAL: no change in appetite, no fatigue, no weight changes, no fever, chills or weakness RESPIRATORY: no cough, SOB, DOE, wheezing, hemoptysis CARDIAC: no chest pain, edema or palpitations GI: no abdominal pain, diarrhea, constipation, heart burn, nausea or vomiting  PHYSICAL EXAMINATION  VS:  T 98.1      P 46     RR 20      BP 112/86   POX %     WT (Lb)  GENERAL: no acute distress, morbidly obese body habitus EYES: Normal sclerae, normal conjunctivae, no discharge NECK: supple, trachea midline, no neck masses, no thyroid tenderness, no thyromegaly LYMPHATICS: No cervical lymphadenopathy, no supraclavicular lymphadenopathy RESPIRATORY: breathing is even & unlabored, BS CTAB CARDIAC: RRR, no murmur,no extra heart sounds, no edema GI: abdomen soft, normal BS, no masses, no tenderness, no  hepatomegaly, no splenomegaly PSYCHIATRIC: the patient is alert & oriented to person, affect & behavior appropriate  LABS/RADIOLOGY:  5-14 WBC 15.7, hemoglobin 11.8, MCV 90.8, platelets 165, glucose 161, BUN 27, creatinine 1.13 otherwise BMP normal 12/2011:  Hemoglobin 9.9, MCV 89.9, otherwise CBC normal.   11/2011:  BUN 35, creatinine 1.56, otherwise BMP normal.   BUN 38, creatinine 1.81, glucose 109, total protein 5.5, albumin 3, otherwise CMP normal.    Vitamin B12 level 601, folate greater than 20, ferritin 183.    ASSESSMENT/PLAN:  Diabetes mellitus.  Well controlled.     Hyperlipidemia.  Well controlled.  Check Fasting lipid panel  Atrial fibrillation.  Rate controlled.    Leukocytosis-patient is asymptomatic. Will reassess.  Hypertension.  Well controlled.   Anemia of chronic disease.  Stable.  Continue iron.   GERD.  Well controlled.     Constipation.  Well controlled.    Urinary incontinence.  Continue oxybutynin.    Insomnia.  Continue Ambien.  No complaints.    Check  liver profile  CPT CODE: 11914

## 2012-11-10 ENCOUNTER — Other Ambulatory Visit: Payer: Self-pay | Admitting: *Deleted

## 2012-11-10 MED ORDER — HYDROCODONE-ACETAMINOPHEN 5-325 MG PO TABS: ORAL_TABLET | ORAL | Status: DC

## 2012-12-01 ENCOUNTER — Non-Acute Institutional Stay (SKILLED_NURSING_FACILITY): Payer: Medicare Other | Admitting: Internal Medicine

## 2012-12-01 DIAGNOSIS — E7800 Pure hypercholesterolemia, unspecified: Secondary | ICD-10-CM

## 2012-12-01 DIAGNOSIS — E119 Type 2 diabetes mellitus without complications: Secondary | ICD-10-CM

## 2012-12-01 DIAGNOSIS — D7289 Other specified disorders of white blood cells: Secondary | ICD-10-CM

## 2012-12-01 DIAGNOSIS — I4891 Unspecified atrial fibrillation: Secondary | ICD-10-CM

## 2012-12-01 NOTE — Progress Notes (Signed)
Patient ID: Kathryn [name], female   DOB: 1933/12/29, 79 y.o.   MRN: [id number]        PROGRESS NOTE  DATE:  12/01/2012  FACILITY: [facility]   LEVEL OF CARE: SNF  Routine Visit  CHIEF COMPLAINT:  Manage atrial fibrillation, hyperlipidemia and diabetes mellitus.   HISTORY OF PRESENT ILLNESS:  REASSESSMENT OF ONGOING PROBLEM(S):  DM:pt's DM remains stable.  Pt denies polyuria, polydipsia, polyphagia, changes in vision or hypoglycemic episodes.  No complications noted from the medication presently being used.  Last hemoglobin A1c is: 02/2012:  Hemoglobin A1C 6.1, in 6/14 hemoglobin A1c 5.8  HYPERLIPIDEMIA: No complications from the medications presently being used. Last fasting lipid panel showed : 03/2012:  Fasting lipid panel normal.   ATRIAL FIBRILLATION: the patients atrial fibrillation remains stable.  The patient denies DOE, tachycardia, orthopnea, transient neurological sx, pedal edema, palpitations, & PNDs.  No complications noted from the medications currently being used.   PAST MEDICAL HISTORY : Reviewed.  No changes.  CURRENT MEDICATIONS: Reviewed per [facility]  REVIEW OF SYSTEMS:  GENERAL: no change in appetite, no fatigue, no weight changes, no fever, chills or weakness RESPIRATORY: no cough, SOB, DOE, wheezing, hemoptysis CARDIAC: no chest pain, edema or palpitations GI: no abdominal pain, diarrhea, constipation, heart burn, nausea or vomiting  PHYSICAL EXAMINATION  VS:  T 97.2      P 60     RR 20      BP 120/62   POX %     WT (Lb)  GENERAL: no acute distress, morbidly obese body habitus NECK: supple, trachea midline, no neck masses, no thyroid tenderness, no thyromegaly RESPIRATORY: breathing is even & unlabored, BS CTAB CARDIAC: RRR, no murmur,no extra heart sounds, no edema GI: abdomen soft, normal BS, no masses, no tenderness, no hepatomegaly, no splenomegaly PSYCHIATRIC: the patient is alert & oriented to person, affect & behavior  appropriate  LABS/RADIOLOGY:  8-14 hemoglobin 10.9, MCV 100.6 otherwise CBC normal  5-14 WBC 15.7, hemoglobin 11.8, MCV 90.8, platelets 165, glucose 161, BUN 27, creatinine 1.13 otherwise BMP normal 12/2011:  Hemoglobin 9.9, MCV 89.9, otherwise CBC normal.   11/2011:  BUN 35, creatinine 1.56, otherwise BMP normal.   BUN 38, creatinine 1.81, glucose 109, total protein 5.5, albumin 3, otherwise CMP normal.    Vitamin B12 level 601, folate greater than 20, ferritin 183.    ASSESSMENT/PLAN:  Diabetes mellitus.  Well controlled.     Hyperlipidemia.  Well controlled.  Check Fasting lipid panel  Atrial fibrillation.  Rate controlled.    Leukocytosis-patient is asymptomatic. Will reassess.  Hypertension.  Well controlled.   Anemia of chronic disease.  Stable.  Continue iron.   GERD.  Well controlled.     Constipation.  Well controlled.    Urinary incontinence.  Continue oxybutynin.    Insomnia.  Continue Ambien.  No complaints.    Check liver profile  CPT CODE: 40981

## 2013-01-03 ENCOUNTER — Non-Acute Institutional Stay (SKILLED_NURSING_FACILITY): Payer: PRIVATE HEALTH INSURANCE | Admitting: Internal Medicine

## 2013-01-03 DIAGNOSIS — E119 Type 2 diabetes mellitus without complications: Secondary | ICD-10-CM

## 2013-01-03 DIAGNOSIS — E7800 Pure hypercholesterolemia, unspecified: Secondary | ICD-10-CM

## 2013-01-03 DIAGNOSIS — I10 Essential (primary) hypertension: Secondary | ICD-10-CM

## 2013-01-03 DIAGNOSIS — I4891 Unspecified atrial fibrillation: Secondary | ICD-10-CM

## 2013-01-03 NOTE — Progress Notes (Signed)
Patient ID: Kathryn [name], female   DOB: 1933/04/05, 79 y.o.   MRN: [id number]        PROGRESS NOTE  DATE:  01/03/2013  FACILITY: [facility]   LEVEL OF CARE: SNF  Routine Visit  CHIEF COMPLAINT:  Manage atrial fibrillation, hyperlipidemia and diabetes mellitus.   HISTORY OF PRESENT ILLNESS:  REASSESSMENT OF ONGOING PROBLEM(S):  DM:pt's DM remains stable.  Pt denies polyuria, polydipsia, polyphagia, changes in vision or hypoglycemic episodes.  No complications noted from the medication presently being used.  Last hemoglobin A1c is: 02/2012:  Hemoglobin A1C 6.1, in 6/14 hemoglobin A1c 5.8  HYPERLIPIDEMIA: No complications from the medications presently being used. Last fasting lipid panel showed : 03/2012:  Fasting lipid panel normal.   ATRIAL FIBRILLATION: the patients atrial fibrillation remains stable.  The patient denies DOE, tachycardia, orthopnea, transient neurological sx, pedal edema, palpitations, & PNDs.  No complications noted from the medications currently being used.   PAST MEDICAL HISTORY : Reviewed.  No changes.  CURRENT MEDICATIONS: Reviewed per [facility]  REVIEW OF SYSTEMS:  GENERAL: no change in appetite, no fatigue, no weight changes, no fever, chills or weakness RESPIRATORY: no cough, SOB, DOE, wheezing, hemoptysis CARDIAC: no chest pain, edema or palpitations GI: no abdominal pain, diarrhea, constipation, heart burn, nausea or vomiting  PHYSICAL EXAMINATION  VS:  T 98.6      P 61    RR 20      BP 130/60   POX % 96     WT (Lb)  GENERAL: no acute distress, morbidly obese body habitus EYES: Normal sclerae, normal conjunctivae, no discharge  NECK: supple, trachea midline, no neck masses, no thyroid tenderness, no thyromegaly LYMPHATICS: No cervical lymphadenopathy, no supraclavicular lymphadenopathy RESPIRATORY: breathing is even & unlabored, BS CTAB CARDIAC: RRR, no murmur,no extra heart sounds, no edema GI: abdomen soft, normal BS, no masses, no tenderness,  no hepatomegaly, no splenomegaly PSYCHIATRIC: the patient is alert & oriented to person, affect & behavior appropriate  LABS/RADIOLOGY:  8-14 hemoglobin 10.9, MCV 100.6 otherwise CBC normal  5-14 WBC 15.7, hemoglobin 11.8, MCV 90.8, platelets 165, glucose 161, BUN 27, creatinine 1.13 otherwise BMP normal 12/2011:  Hemoglobin 9.9, MCV 89.9, otherwise CBC normal.   11/2011:  BUN 35, creatinine 1.56, otherwise BMP normal.   BUN 38, creatinine 1.81, glucose 109, total protein 5.5, albumin 3, otherwise CMP normal.    Vitamin B12 level 601, folate greater than 20, ferritin 183.    ASSESSMENT/PLAN:  Diabetes mellitus.  Well controlled.     Hyperlipidemia.  Well controlled.  Check Fasting lipid panel  Atrial fibrillation.  Rate controlled.    Hypertension.  Well controlled.   Anemia of chronic disease.  Stable.  Continue iron.   GERD.  Well controlled.     Constipation.  Well controlled.    Urinary incontinence.  Continue oxybutynin.    Insomnia.  Continue Ambien.  No complaints.    Check liver profile  CPT CODE: 40981

## 2013-01-24 ENCOUNTER — Encounter: Payer: Self-pay | Admitting: Internal Medicine

## 2013-01-24 ENCOUNTER — Non-Acute Institutional Stay (SKILLED_NURSING_FACILITY): Payer: PRIVATE HEALTH INSURANCE | Admitting: Internal Medicine

## 2013-01-24 DIAGNOSIS — E119 Type 2 diabetes mellitus without complications: Secondary | ICD-10-CM

## 2013-01-24 DIAGNOSIS — I4891 Unspecified atrial fibrillation: Secondary | ICD-10-CM

## 2013-01-24 DIAGNOSIS — I10 Essential (primary) hypertension: Secondary | ICD-10-CM

## 2013-01-24 DIAGNOSIS — E7800 Pure hypercholesterolemia, unspecified: Secondary | ICD-10-CM

## 2013-01-24 NOTE — Progress Notes (Signed)
Patient ID: Kathryn [name], female   DOB: 1933-11-05, 79 y.o.   MRN: [id number]        PROGRESS NOTE  DATE:  01/24/2013  FACILITY: [facility]   LEVEL OF CARE: SNF  Routine Visit  CHIEF COMPLAINT:  Manage atrial fibrillation, hyperlipidemia and diabetes mellitus.   HISTORY OF PRESENT ILLNESS:  REASSESSMENT OF ONGOING PROBLEM(S):  DM:pt's DM remains stable.  Pt denies polyuria, polydipsia, polyphagia, changes in vision or hypoglycemic episodes.  No complications noted from the medication presently being used.  Last hemoglobin A1c is: 02/2012:  Hemoglobin A1C 6.1, in 6/14 hemoglobin A1c 5.8  HYPERLIPIDEMIA: No complications from the medications presently being used. Last fasting lipid panel showed : 03/2012:  Fasting lipid panel normal, in 10-14 HDL 28 otherwise fasting lipid panel normal  ATRIAL FIBRILLATION: the patients atrial fibrillation remains stable.  The patient denies DOE, tachycardia, orthopnea, transient neurological sx, pedal edema, palpitations, & PNDs.  No complications noted from the medications currently being used.   PAST MEDICAL HISTORY : Reviewed.  No changes.  CURRENT MEDICATIONS: Reviewed per [facility]  REVIEW OF SYSTEMS:  GENERAL: no change in appetite, no fatigue, no weight changes, no fever, chills or weakness RESPIRATORY: no cough, SOB, DOE, wheezing, hemoptysis CARDIAC: no chest pain, edema or palpitations GI: no abdominal pain, diarrhea, constipation, heart burn, nausea or vomiting  PHYSICAL EXAMINATION  VS:  T 97.1      P 74    RR 18     BP 136/70   POX %     WT (Lb)  GENERAL: no acute distress, morbidly obese body habitus EYES: Normal sclerae, normal conjunctivae, no discharge  NECK: supple, trachea midline, no neck masses, no thyroid tenderness, no thyromegaly LYMPHATICS: No cervical lymphadenopathy, no supraclavicular lymphadenopathy RESPIRATORY: breathing is even & unlabored, BS CTAB CARDIAC: RRR, no murmur,no extra heart sounds, no edema GI:  abdomen soft, normal BS, no masses, no tenderness, no hepatomegaly, no splenomegaly PSYCHIATRIC: the patient is alert & oriented to person, affect & behavior appropriate  LABS/RADIOLOGY:  10-14 glucose 106, BUN 35, total protein 5.2, albumin 2.8, alkaline phosphatase 162 otherwise CMP normal, serum iron 69, TIBC 188, iron saturation 37%, ferritin 263, TSH 1.061, hemoglobin 10, MCV 99.4 otherwise CBC normal  8-14 hemoglobin 10.9, MCV 100.6 otherwise CBC normal  5-14 WBC 15.7, hemoglobin 11.8, MCV 90.8, platelets 165, glucose 161, BUN 27, creatinine 1.13 otherwise BMP normal 12/2011:  Hemoglobin 9.9, MCV 89.9, otherwise CBC normal.   11/2011:  BUN 35, creatinine 1.56, otherwise BMP normal.   BUN 38, creatinine 1.81, glucose 109, total protein 5.5, albumin 3, otherwise CMP normal.    Vitamin B12 level 601, folate greater than 20, ferritin 183.    ASSESSMENT/PLAN:  Diabetes mellitus.  Well controlled.     Hyperlipidemia.  Lipitor was discontinued  Atrial fibrillation.  Rate controlled.    Hypertension.  Well controlled.   Anemia of chronic disease.  Stable.  Continue iron.   GERD.  Well controlled.     Constipation.  Well controlled.    Urinary incontinence.  Continue oxybutynin.    Insomnia.  Continue Ambien.  No complaints.    CPT CODE: 19147

## 2013-02-14 ENCOUNTER — Non-Acute Institutional Stay (SKILLED_NURSING_FACILITY): Payer: PRIVATE HEALTH INSURANCE | Admitting: Internal Medicine

## 2013-02-14 DIAGNOSIS — I4891 Unspecified atrial fibrillation: Secondary | ICD-10-CM

## 2013-02-14 DIAGNOSIS — D638 Anemia in other chronic diseases classified elsewhere: Secondary | ICD-10-CM

## 2013-02-14 DIAGNOSIS — E119 Type 2 diabetes mellitus without complications: Secondary | ICD-10-CM

## 2013-02-14 DIAGNOSIS — I10 Essential (primary) hypertension: Secondary | ICD-10-CM

## 2013-02-16 ENCOUNTER — Encounter: Payer: Self-pay | Admitting: Internal Medicine

## 2013-02-16 NOTE — Progress Notes (Signed)
Patient ID: Kathryn [name], female   DOB: 02-29-1934, 79 y.o.   MRN: [id number]        PROGRESS NOTE  DATE:  02/14/2013  FACILITY: [facility]   LEVEL OF CARE: SNF  Routine Visit  CHIEF COMPLAINT:  Manage atrial fibrillation, hypertension and diabetes mellitus.   HISTORY OF PRESENT ILLNESS:  REASSESSMENT OF ONGOING PROBLEM(S):  DM:pt's DM remains stable.  Pt denies polyuria, polydipsia, polyphagia, changes in vision or hypoglycemic episodes.  No complications noted from the medication presently being used.  Last hemoglobin A1c is: 02/2012:  Hemoglobin A1C 6.1, in 6/14 hemoglobin A1c 5.8  HTN: Pt 's HTN remains stable.  Denies CP, sob, DOE, pedal edema, headaches, dizziness or visual disturbances.  No complications from the medications currently being used.  Last BP : 133/53  ATRIAL FIBRILLATION: the patients atrial fibrillation remains stable.  The patient denies DOE, tachycardia, orthopnea, transient neurological sx, pedal edema, palpitations, & PNDs.  No complications noted from the medications currently being used.   PAST MEDICAL HISTORY : Reviewed.  No changes.  CURRENT MEDICATIONS: Reviewed per [facility]  REVIEW OF SYSTEMS:  GENERAL: no change in appetite, no fatigue, no weight changes, no fever, chills or weakness RESPIRATORY: no cough, SOB, DOE, wheezing, hemoptysis CARDIAC: no chest pain, edema or palpitations GI: no abdominal pain, diarrhea, constipation, heart burn, nausea or vomiting  PHYSICAL EXAMINATION  VS:  T 98.1      P 70    RR 20     BP 133/53   POX %     WT (Lb)  GENERAL: no acute distress, morbidly obese body habitus EYES: Normal sclerae, normal conjunctivae, no discharge  NECK: supple, trachea midline, no neck masses, no thyroid tenderness, no thyromegaly LYMPHATICS: No cervical lymphadenopathy, no supraclavicular lymphadenopathy RESPIRATORY: breathing is even & unlabored, BS CTAB CARDIAC: RRR, no murmur,no extra heart sounds, no edema GI: abdomen soft,  normal BS, no masses, no tenderness, no hepatomegaly, no splenomegaly PSYCHIATRIC: the patient is alert & oriented to person, affect & behavior appropriate  LABS/RADIOLOGY:  11-14 total protein 5.6, albumin 3.2, alkaline phosphatase 136 otherwise liver profile normal  10-14 glucose 106, BUN 35, total protein 5.2, albumin 2.8, alkaline phosphatase 162 otherwise CMP normal, serum iron 69, TIBC 188, iron saturation 37%, ferritin 263, TSH 1.061, hemoglobin 10, MCV 99.4 otherwise CBC normal  8-14 hemoglobin 10.9, MCV 100.6 otherwise CBC normal  5-14 WBC 15.7, hemoglobin 11.8, MCV 90.8, platelets 165, glucose 161, BUN 27, creatinine 1.13 otherwise BMP normal 12/2011:  Hemoglobin 9.9, MCV 89.9, otherwise CBC normal.   11/2011:  BUN 35, creatinine 1.56, otherwise BMP normal.   BUN 38, creatinine 1.81, glucose 109, total protein 5.5, albumin 3, otherwise CMP normal.    Vitamin B12 level 601, folate greater than 20, ferritin 183.    ASSESSMENT/PLAN:  Diabetes mellitus.  Well controlled. Check hemoglobin A1c    Atrial fibrillation.  Rate controlled.    Hypertension.  Well controlled.   Anemia of chronic disease.  Stable.  Continue iron.   GERD.  Well controlled.     Constipation.  Well controlled.    Urinary incontinence.  Continue oxybutynin.    Insomnia.  Continue Ambien.  No complaints.    CPT CODE: 40981

## 2013-03-14 ENCOUNTER — Non-Acute Institutional Stay (SKILLED_NURSING_FACILITY): Payer: PRIVATE HEALTH INSURANCE | Admitting: Internal Medicine

## 2013-03-14 DIAGNOSIS — I10 Essential (primary) hypertension: Secondary | ICD-10-CM

## 2013-03-14 DIAGNOSIS — I4891 Unspecified atrial fibrillation: Secondary | ICD-10-CM

## 2013-03-14 DIAGNOSIS — E119 Type 2 diabetes mellitus without complications: Secondary | ICD-10-CM

## 2013-03-14 DIAGNOSIS — D638 Anemia in other chronic diseases classified elsewhere: Secondary | ICD-10-CM

## 2013-03-14 NOTE — Progress Notes (Signed)
Patient ID: Kathryn [name], female   DOB: October 04, 1933, 80 y.o.   MRN: [id number]        PROGRESS NOTE  DATE:  03-14-13  FACILITY: [facility]   LEVEL OF CARE: SNF  Routine Visit  CHIEF COMPLAINT:  Manage atrial fibrillation, hypertension and diabetes mellitus.   HISTORY OF PRESENT ILLNESS:  REASSESSMENT OF ONGOING PROBLEM(S):  DM:pt's DM remains stable.  Pt denies polyuria, polydipsia, polyphagia, changes in vision or hypoglycemic episodes.  No complications noted from the medication presently being used.  Last hemoglobin A1c is: 02/2012:  Hemoglobin A1C 6.1, in 6/14 hemoglobin A1c 5.8, in 12-14 hemoglobin A1c 5.8  HTN: Pt 's HTN remains stable.  Denies CP, sob, DOE, pedal edema, headaches, dizziness or visual disturbances.  No complications from the medications currently being used.  Last BP : 133/53  ATRIAL FIBRILLATION: the patients atrial fibrillation remains stable.  The patient denies DOE, tachycardia, orthopnea, transient neurological sx, pedal edema, palpitations, & PNDs.  No complications noted from the medications currently being used.   PAST MEDICAL HISTORY : Reviewed.  No changes.  CURRENT MEDICATIONS: Reviewed per [facility]  REVIEW OF SYSTEMS:  GENERAL: no change in appetite, no fatigue, no weight changes, no fever, chills or weakness RESPIRATORY: no cough, SOB, DOE, wheezing, hemoptysis CARDIAC: no chest pain, edema or palpitations GI: no abdominal pain, diarrhea, constipation, heart burn, nausea or vomiting  PHYSICAL EXAMINATION  VS:  T 98.1      P 70    RR 20     BP 133/53     GENERAL: no acute distress, morbidly obese body habitus EYES: Normal sclerae, normal conjunctivae, no discharge  NECK: supple, trachea midline, no neck masses, no thyroid tenderness, no thyromegaly LYMPHATICS: No cervical lymphadenopathy, no supraclavicular lymphadenopathy RESPIRATORY: breathing is even & unlabored, BS CTAB CARDIAC: RRR, no murmur,no extra heart sounds, no edema GI: abdomen  soft, normal BS, no masses, no tenderness, no hepatomegaly, no splenomegaly PSYCHIATRIC: the patient is alert & oriented to person, affect & behavior appropriate  LABS/RADIOLOGY:  11-14 total protein 5.6, albumin 3.2, alkaline phosphatase 136 otherwise liver profile normal  10-14 glucose 106, BUN 35, total protein 5.2, albumin 2.8, alkaline phosphatase 162 otherwise CMP normal, serum iron 69, TIBC 188, iron saturation 37%, ferritin 263, TSH 1.061, hemoglobin 10, MCV 99.4 otherwise CBC normal  8-14 hemoglobin 10.9, MCV 100.6 otherwise CBC normal  5-14 WBC 15.7, hemoglobin 11.8, MCV 90.8, platelets 165, glucose 161, BUN 27, creatinine 1.13 otherwise BMP normal 12/2011:  Hemoglobin 9.9, MCV 89.9, otherwise CBC normal.   11/2011:  BUN 35, creatinine 1.56, otherwise BMP normal.   BUN 38, creatinine 1.81, glucose 109, total protein 5.5, albumin 3, otherwise CMP normal.    Vitamin B12 level 601, folate greater than 20, ferritin 183.    ASSESSMENT/PLAN:  Diabetes mellitus.  Well controlled. Lantus was decreased  Atrial fibrillation.  Rate controlled.    Hypertension.  Well controlled.   Anemia of chronic disease.  Stable.  Continue iron.   GERD.  Well controlled.     Constipation.  Well controlled.    Urinary incontinence.  Continue oxybutynin.    Insomnia.  Continue Ambien.  No complaints.    CPT CODE: 00938

## 2013-04-02 ENCOUNTER — Other Ambulatory Visit: Payer: Self-pay | Admitting: *Deleted

## 2013-04-02 MED ORDER — ZOLPIDEM TARTRATE 5 MG PO TABS: 5.0000 mg | ORAL_TABLET | Freq: Every day | ORAL | Status: DC

## 2013-04-30 ENCOUNTER — Emergency Department (HOSPITAL_COMMUNITY): Payer: Medicare Other

## 2013-04-30 ENCOUNTER — Emergency Department (HOSPITAL_COMMUNITY)
Admission: EM | Admit: 2013-04-30 | Discharge: 2013-04-30 | Disposition: A | Discharge status: Home / Self Care | Payer: Medicare Other | Attending: Emergency Medicine | Admitting: Emergency Medicine

## 2013-04-30 ENCOUNTER — Encounter (HOSPITAL_COMMUNITY): Payer: Self-pay | Admitting: Emergency Medicine

## 2013-04-30 DIAGNOSIS — H3530 Unspecified macular degeneration: Secondary | ICD-10-CM | POA: Insufficient documentation

## 2013-04-30 DIAGNOSIS — Z872 Personal history of diseases of the skin and subcutaneous tissue: Secondary | ICD-10-CM | POA: Insufficient documentation

## 2013-04-30 DIAGNOSIS — D649 Anemia, unspecified: Secondary | ICD-10-CM | POA: Insufficient documentation

## 2013-04-30 DIAGNOSIS — Z79899 Other long term (current) drug therapy: Secondary | ICD-10-CM | POA: Insufficient documentation

## 2013-04-30 DIAGNOSIS — W19XXXA Unspecified fall, initial encounter: Secondary | ICD-10-CM

## 2013-04-30 DIAGNOSIS — Y9389 Activity, other specified: Secondary | ICD-10-CM | POA: Insufficient documentation

## 2013-04-30 DIAGNOSIS — Z87448 Personal history of other diseases of urinary system: Secondary | ICD-10-CM | POA: Insufficient documentation

## 2013-04-30 DIAGNOSIS — IMO0002 Reserved for concepts with insufficient information to code with codable children: Secondary | ICD-10-CM | POA: Insufficient documentation

## 2013-04-30 DIAGNOSIS — S51809A Unspecified open wound of unspecified forearm, initial encounter: Secondary | ICD-10-CM | POA: Insufficient documentation

## 2013-04-30 DIAGNOSIS — S51819A Laceration without foreign body of unspecified forearm, initial encounter: Secondary | ICD-10-CM

## 2013-04-30 DIAGNOSIS — Z87440 Personal history of urinary (tract) infections: Secondary | ICD-10-CM | POA: Insufficient documentation

## 2013-04-30 DIAGNOSIS — I4891 Unspecified atrial fibrillation: Secondary | ICD-10-CM | POA: Insufficient documentation

## 2013-04-30 DIAGNOSIS — Z23 Encounter for immunization: Secondary | ICD-10-CM | POA: Insufficient documentation

## 2013-04-30 DIAGNOSIS — E119 Type 2 diabetes mellitus without complications: Secondary | ICD-10-CM | POA: Insufficient documentation

## 2013-04-30 DIAGNOSIS — M1990 Unspecified osteoarthritis, unspecified site: Secondary | ICD-10-CM | POA: Insufficient documentation

## 2013-04-30 DIAGNOSIS — R609 Edema, unspecified: Secondary | ICD-10-CM | POA: Insufficient documentation

## 2013-04-30 DIAGNOSIS — I10 Essential (primary) hypertension: Secondary | ICD-10-CM | POA: Insufficient documentation

## 2013-04-30 DIAGNOSIS — Z794 Long term (current) use of insulin: Secondary | ICD-10-CM | POA: Insufficient documentation

## 2013-04-30 DIAGNOSIS — S0990XA Unspecified injury of head, initial encounter: Secondary | ICD-10-CM | POA: Insufficient documentation

## 2013-04-30 DIAGNOSIS — S92243A Displaced fracture of medial cuneiform of unspecified foot, initial encounter for closed fracture: Secondary | ICD-10-CM

## 2013-04-30 DIAGNOSIS — W06XXXA Fall from bed, initial encounter: Secondary | ICD-10-CM | POA: Insufficient documentation

## 2013-04-30 DIAGNOSIS — S199XXA Unspecified injury of neck, initial encounter: Secondary | ICD-10-CM

## 2013-04-30 DIAGNOSIS — M810 Age-related osteoporosis without current pathological fracture: Secondary | ICD-10-CM | POA: Insufficient documentation

## 2013-04-30 DIAGNOSIS — Y9210 Unspecified residential institution as the place of occurrence of the external cause: Secondary | ICD-10-CM | POA: Insufficient documentation

## 2013-04-30 DIAGNOSIS — Z792 Long term (current) use of antibiotics: Secondary | ICD-10-CM | POA: Insufficient documentation

## 2013-04-30 DIAGNOSIS — S0993XA Unspecified injury of face, initial encounter: Secondary | ICD-10-CM | POA: Insufficient documentation

## 2013-04-30 MED ORDER — HYDROCODONE-ACETAMINOPHEN 5-325 MG PO TABS
1.0000 | ORAL_TABLET | Freq: Once | ORAL | Status: AC
  Administered 2013-04-30: 1 via ORAL
  Filled 2013-04-30: qty 1

## 2013-04-30 MED ORDER — TETANUS-DIPHTH-ACELL PERTUSSIS 5-2.5-18.5 LF-MCG/0.5 IM SUSP
0.5000 mL | Freq: Once | INTRAMUSCULAR | Status: AC
Start: 2013-04-30 — End: 2013-04-30
  Administered 2013-04-30: 0.5 mL via INTRAMUSCULAR
  Filled 2013-04-30: qty 0.5

## 2013-04-30 NOTE — ED Notes (Signed)
Pt calmly resting in gurney waiting for PTAR.

## 2013-04-30 NOTE — ED Provider Notes (Signed)
CSN: [id number]     Arrival date & time 04/30/13  1556 History   First MD Initiated Contact with Patient 04/30/13 1558     Chief Complaint  Patient presents with  . Fall     (Consider location/radiation/quality/duration/timing/severity/associated sxs/prior Treatment) HPI Comments: 80 year old female presents from [facility] after falling out of her bed while getting a bath. Patient states she was rolled over on her left side and seemed to lose control of bowel out of the bed. She hit her right side of her head on a desk. She mostly complaining of pain just behind her right ear. She also has some right-sided neck pain around her trapezius and shoulder. She states the pain is moderate. She feels like she did have one of her normal Norco she would be better. She also feels like she bit her lip and had bleeding temporarily. She also suffered a skin tear to her left forearm. The pain is mostly localized at the site of the injury and not deeper. Denies hitting her chest or abdomen. Did not lose consciousness. Does not think she is on blood thinners.   Past Medical History  Diagnosis Date  . Osteoporosis   . DM (diabetes mellitus)   . HTN (hypertension)     unspecified  . Atrial fibrillation   . Macular degeneration   . Osteoarthritis   . Edema   . Anemia   . Hydronephrosis     left  . UTI (lower urinary tract infection)   . Pressure ulcer of back    Past Surgical History  Procedure Laterality Date  . Replacement total knee  08/2007    bilateral  . Appendectomy    . Cholecystectomy    . Colonoscopy    . Upper gastrointestinal endoscopy    . Cystoscopy w/ ureteral stent placement  04/29/2011    Procedure: CYSTOSCOPY WITH RETROGRADE PYELOGRAM/URETERAL STENT PLACEMENT;  Surgeon: Bernestine Amass, MD;  Location: [facility];  Service: Urology;  Laterality: Bilateral;  bilateral retrograde, double j stent. Cystogram   . Ureteroscopy  04/29/2011    Procedure: URETEROSCOPY;  Surgeon: Bernestine Amass, MD;  Location: [facility];  Service: Urology;  Laterality: Right;  . Cystoscopy with ureteroscopy  05/31/2011    Procedure: CYSTOSCOPY WITH URETEROSCOPY;  Surgeon: Bernestine Amass, MD;  Location: [facility];  Service: Urology;  Laterality: Right;  Ureteral biopsy Right     Family History  Problem Relation Age of Onset  . Diabetes Father   . Atrial fibrillation Father   . Breast cancer Paternal Grandmother   . Heart disease Mother    History  Substance Use Topics  . Smoking status: Never Smoker   . Smokeless tobacco: Never Used  . Alcohol Use: No   OB History   Grav Para Term Preterm Abortions TAB SAB Ect Mult Living                 Review of Systems  Respiratory: Negative for shortness of breath.   Cardiovascular: Negative for chest pain.  Gastrointestinal: Negative for vomiting.  Musculoskeletal: Positive for neck pain.  Skin: Positive for wound.  Neurological: Positive for headaches. Negative for weakness.  All other systems reviewed and are negative.      Allergies  Celebrex; Nsaids; and Sulfa antibiotics  Home Medications   Current Outpatient Rx  Name  Route  Sig  Dispense  Refill  . atorvastatin (LIPITOR) 20 MG tablet   Oral   Take 20 mg by mouth at  bedtime.          . Calcium Carbonate-Vitamin D 600-400 MG-UNIT per tablet   Oral   Take 1 tablet by mouth 2 (two) times daily.          . cloNIDine (CATAPRES) 0.1 MG tablet   Oral   Take 0.1 mg by mouth daily as needed. If BP > 160         . ferrous sulfate 325 (65 FE) MG tablet   Oral   Take 325 mg by mouth daily with breakfast.           . HYDROcodone-acetaminophen (NORCO/VICODIN) 5-325 MG per tablet      Take one tablet by mouth twice daily   60 tablet   5   . hydrOXYzine (ATARAX/VISTARIL) 25 MG tablet   Oral   Take 25 mg by mouth 3 (three) times daily as needed. For itching.         . insulin aspart (NOVOLOG) 100 UNIT/ML injection   Subcutaneous   Inject 5 Units into the skin 3  (three) times daily before meals.         . insulin glargine (LANTUS) 100 UNIT/ML injection   Subcutaneous   Inject 15 Units into the skin at bedtime.         Marland Kitchen lactulose, encephalopathy, (CHRONULAC) 10 GM/15ML SOLN   Oral   Take 20 g by mouth daily as needed. For constipation.         Marland Kitchen losartan (COZAAR) 100 MG tablet   Oral   Take 100 mg by mouth daily.          Marland Kitchen menthol-cetylpyridinium (CEPACOL) 3 MG lozenge   Oral   Take 1 lozenge by mouth every 4 (four) hours as needed. For sore throat.         . metoprolol (LOPRESSOR) 50 MG tablet   Oral   Take 50 mg by mouth 2 (two) times daily. Hold for HR < 60         . moxifloxacin (AVELOX) 400 MG tablet   Oral   Take 400 mg by mouth daily. Started 11/05/11 for 7 days.         . Multiple Vitamin (MULITIVITAMIN WITH MINERALS) TABS   Oral   Take 1 tablet by mouth every morning.          . Nutritional Supplements (NUTRITIONAL SUPPLEMENT PO)   Oral   Take 90 mLs by mouth 4 (four) times daily. Medpass Sugar Free.         . oxybutynin (DITROPAN) 5 MG tablet   Oral   Take 5 mg by mouth 3 (three) times daily as needed. For bladder spasms.         . pantoprazole (PROTONIX) 40 MG tablet   Oral   Take 40 mg by mouth every morning.          . polyethylene glycol (MIRALAX / GLYCOLAX) packet   Oral   Take 17 g by mouth every morning.          . promethazine (PHENERGAN) 25 MG tablet   Oral   Take 25 mg by mouth every 6 (six) hours as needed. For nausea.         Marland Kitchen saccharomyces boulardii (FLORASTOR) 250 MG capsule   Oral   Take 250 mg by mouth 2 (two) times daily. 10 day course of therapy started 11/05/11         . sennosides-docusate sodium (SENOKOT-S) 8.6-50 MG tablet  Oral   Take 2 tablets by mouth at bedtime.           . simethicone (MYLICON) 80 MG chewable tablet   Oral   Chew 160 mg by mouth every 6 (six) hours as needed. For gas pains.         Marland Kitchen solifenacin (VESICARE) 10 MG tablet    Oral   Take 10 mg by mouth every morning.          . zolpidem (AMBIEN) 5 MG tablet   Oral   Take 1 tablet (5 mg total) by mouth at bedtime. For sleep.   30 tablet   5    BP 171/76  Pulse 61  Temp(Src) 98.6 F (37 C) (Oral)  Resp 16  SpO2 95% Physical Exam  Nursing note and vitals reviewed. Constitutional: She is oriented to person, place, and time. She appears well-developed and well-nourished. No distress.  HENT:  Head: Normocephalic.    Right Ear: External ear normal.  Left Ear: External ear normal.  Nose: Nose normal.  Mouth/Throat:    No tenderness over jaw or mandible  Eyes: Pupils are equal, round, and reactive to light. Right eye exhibits no discharge. Left eye exhibits no discharge.  Neck: No spinous process tenderness present.    Cardiovascular: Normal rate, regular rhythm and normal heart sounds.   Pulmonary/Chest: Effort normal and breath sounds normal. She exhibits no tenderness.  Abdominal: Soft. She exhibits no distension. There is no tenderness.  Musculoskeletal:       Left forearm: She exhibits tenderness.       Arms:      Right foot: She exhibits tenderness.       Feet:  Neurological: She is alert and oriented to person, place, and time.  Skin: Skin is warm and dry.    ED Course  Procedures (including critical care time) Labs Review Labs Reviewed - No data to display Imaging Review Dg Forearm Left  04/30/2013   CLINICAL DATA:  Fall with left forearm pain.  EXAM: LEFT FOREARM - 2 VIEW  COMPARISON:  None.  FINDINGS: Two view exam of the left forearm shows no fracture. No worrisome lytic or sclerotic osseous abnormality. Arterial wall calcification in the soft tissues of the forearm suggests underlying diabetes.  IMPRESSION: No acute bony abnormality.   Electronically Signed   By: Misty Crosland M.D.   On: 04/30/2013 17:27   Ct Head Wo Contrast  04/30/2013   CLINICAL DATA:  Fall.  Right-sided head injury.  EXAM: CT HEAD WITHOUT CONTRAST  CT  CERVICAL SPINE WITHOUT CONTRAST  TECHNIQUE: Multidetector CT imaging of the head and cervical spine was performed following the standard protocol without intravenous contrast. Multiplanar CT image reconstructions of the cervical spine were also generated.  COMPARISON:  CT head without contrast 05/15/2011.  FINDINGS: CT HEAD FINDINGS  High-density material is present within the maxillary sinuses bilaterally. The ethmoid air cells and frontal sinuses are opacified. The fluid levels are present in the sphenoid sinuses bilaterally. The mastoid air cells are clear.  A right posterior frontal parietal scalp hematoma is present. There is no underlying fracture. Vascular calcifications within the scalp are again noted.  Moderate generalized atrophy and white matter disease is similar to the prior exam. No acute cortical infarct, hemorrhage, or mass lesion is present. The ventricles are proportionate to the degree of atrophy and stable. No significant extra-axial fluid collection is present.  CT CERVICAL SPINE FINDINGS  The cervical spine is imaged and skull  base through T1-2. Endplate degenerative changes and facet hypertrophy are present throughout the cervical spine. No acute fracture or traumatic subluxation is evident. Degenerative anterolisthesis is present at C4-5. Osseous foraminal stenosis is most pronounced on the left at C4-5 and C6-7. Right-sided disease is most severe at C2-3 and C3-4. Atherosclerotic calcifications are present within the carotid bifurcations bilaterally. The thyroid is unremarkable. The lung apices are clear.  IMPRESSION: 1. Right posterior frontal and parietal soft tissue swelling without an underlying fracture. 2. Stable atrophy and diffuse white matter disease. 3. Moderate spondylosis of the cervical spine without evidence for acute fracture or traumatic subluxation. 4. High-density material within the paranasal sinuses. No definite fracture is seen. This likely represents inspissated  secretions. If the patient is tender over the face, CT of the face would be useful for further evaluation.   Electronically Signed   By: Lawrence Santiago M.D.   On: 04/30/2013 17:12   Ct Cervical Spine Wo Contrast  04/30/2013   CLINICAL DATA:  Fall.  Right-sided head injury.  EXAM: CT HEAD WITHOUT CONTRAST  CT CERVICAL SPINE WITHOUT CONTRAST  TECHNIQUE: Multidetector CT imaging of the head and cervical spine was performed following the standard protocol without intravenous contrast. Multiplanar CT image reconstructions of the cervical spine were also generated.  COMPARISON:  CT head without contrast 05/15/2011.  FINDINGS: CT HEAD FINDINGS  High-density material is present within the maxillary sinuses bilaterally. The ethmoid air cells and frontal sinuses are opacified. The fluid levels are present in the sphenoid sinuses bilaterally. The mastoid air cells are clear.  A right posterior frontal parietal scalp hematoma is present. There is no underlying fracture. Vascular calcifications within the scalp are again noted.  Moderate generalized atrophy and white matter disease is similar to the prior exam. No acute cortical infarct, hemorrhage, or mass lesion is present. The ventricles are proportionate to the degree of atrophy and stable. No significant extra-axial fluid collection is present.  CT CERVICAL SPINE FINDINGS  The cervical spine is imaged and skull base through T1-2. Endplate degenerative changes and facet hypertrophy are present throughout the cervical spine. No acute fracture or traumatic subluxation is evident. Degenerative anterolisthesis is present at C4-5. Osseous foraminal stenosis is most pronounced on the left at C4-5 and C6-7. Right-sided disease is most severe at C2-3 and C3-4. Atherosclerotic calcifications are present within the carotid bifurcations bilaterally. The thyroid is unremarkable. The lung apices are clear.  IMPRESSION: 1. Right posterior frontal and parietal soft tissue swelling  without an underlying fracture. 2. Stable atrophy and diffuse white matter disease. 3. Moderate spondylosis of the cervical spine without evidence for acute fracture or traumatic subluxation. 4. High-density material within the paranasal sinuses. No definite fracture is seen. This likely represents inspissated secretions. If the patient is tender over the face, CT of the face would be useful for further evaluation.   Electronically Signed   By: Lawrence Santiago M.D.   On: 04/30/2013 17:12   Dg Foot Complete Right  04/30/2013   CLINICAL DATA:  Fall.  Right foot pain.  EXAM: RIGHT FOOT COMPLETE - 3+ VIEW  COMPARISON:  None.  FINDINGS: Bony demineralization. Metacarpophalangeal joints are hyper extended with interphalangeal joints hyperflexed, resulting in osseous superimposition and reduced sensitivity and specificity.  Extensive atherosclerotic vascular calcifications are present in the foot. Slight periosteal irregularity along the metatarsals noted, without visible fracture. I do not observe malalignment at the Lisfranc joint.  Midfoot spurring noted. Prominent Achilles calcaneal spur. Dorsal talonavicular spur with suspected  chronic fragmentation. Skin calcifications noted in the distal calf.  Questionable linear lucency extending longitudinally within the medial cuneiform.  IMPRESSION: 1. Possible fracture of the medial cuneiform. CT of the foot is recommended. 2. Suspected chronic periostitis along the metatarsals. No malalignment at the Lisfranc joint is currently observed. 3. Bony demineralization and metatarsophalangeal hyperextension with interphalangeal hyperflexion, reducing sensitivity in conventional radiographic assessment of the toes. 4. Scattered spurring in the forefoot, hindfoot, and midfoot. 5. Extensive vascular calcifications. There also some skin calcifications in the distal calf.   Electronically Signed   By: Sherryl Barters M.D.   On: 04/30/2013 17:31    EKG Interpretation   None        MDM   Final diagnoses:  Closed fracture of medial cuneiform bone of foot  Fall  Skin tear of forearm without complication    Patient with mechanical fall and injuries as above. No obvious intracranial injury or cervical injury. Patient's neck pain is more over shoulder and trapezius and has normal range of motion, doubt acute neck injury. Forearm skin tears repaired with Steri-Strips. X-ray of foot shows medial cuneiform fracture. No displacement. Patient has mild pain there. Her x-ray recommends a CT scan due to this being a possible fracture on films but with her tenderness I feel this represents a fracture. She otherwise does not walk. Will give outpatient ortho referral.    Ephraim Hamburger, MD 04/30/13 [phone]

## 2013-04-30 NOTE — ED Notes (Signed)
Pt is a resident at [facility]. Pt had a witnessed fall out of her bed, falling onto the right side of her body. Staff reports the patient did hit her head, however denied LOC. Pt reports pain behind the right ear, right sided neck pain, lip pain (pt reports biting her lip), and right sided toe pain. Pt also has skin tear to left forearm. Pt is non-ambulatory per history. Pt is A/O x4.

## 2013-04-30 NOTE — ED Notes (Signed)
[facility]AR here to take pt back to her facility.

## 2013-05-08 ENCOUNTER — Encounter: Payer: Self-pay | Admitting: *Deleted

## 2013-06-26 ENCOUNTER — Non-Acute Institutional Stay (SKILLED_NURSING_FACILITY): Payer: PRIVATE HEALTH INSURANCE | Admitting: Internal Medicine

## 2013-06-26 DIAGNOSIS — D638 Anemia in other chronic diseases classified elsewhere: Secondary | ICD-10-CM

## 2013-06-26 DIAGNOSIS — I4891 Unspecified atrial fibrillation: Secondary | ICD-10-CM

## 2013-06-26 DIAGNOSIS — E119 Type 2 diabetes mellitus without complications: Secondary | ICD-10-CM

## 2013-06-26 DIAGNOSIS — I10 Essential (primary) hypertension: Secondary | ICD-10-CM

## 2013-06-27 NOTE — Progress Notes (Signed)
Patient ID: Kathryn [name], female   DOB: [birth year], 80 y.o.   MRN: [id number]          PROGRESS NOTE  DATE:  06-26-13  FACILITY: [facility]   LEVEL OF CARE: SNF  Routine Visit  CHIEF COMPLAINT:  Manage atrial fibrillation, hypertension and diabetes mellitus.   HISTORY OF PRESENT ILLNESS:  REASSESSMENT OF ONGOING PROBLEM(S):  DM:pt's DM remains stable.  Pt denies polyuria, polydipsia, polyphagia, changes in vision or hypoglycemic episodes.  No complications noted from the medication presently being used.  Last hemoglobin A1c is: 02/2012:  Hemoglobin A1C 6.1, in 6/14 hemoglobin A1c 5.8, in 12-14 hemoglobin A1c 5.8  HTN: Pt 's HTN remains stable.  Denies CP, sob, DOE, pedal edema, headaches, dizziness or visual disturbances.  No complications from the medications currently being used.  Last BP : 133/53, 127/88  ATRIAL FIBRILLATION: the patients atrial fibrillation remains stable.  The patient denies DOE, tachycardia, orthopnea, transient neurological sx, pedal edema, palpitations, & PNDs.  No complications noted from the medications currently being used.   PAST MEDICAL HISTORY : Reviewed.  No changes.  CURRENT MEDICATIONS: Reviewed per [facility]  REVIEW OF SYSTEMS:  GENERAL: no change in appetite, no fatigue, no weight changes, no fever, chills or weakness RESPIRATORY: no cough, SOB, DOE, wheezing, hemoptysis CARDIAC: no chest pain, edema or palpitations GI: no abdominal pain, diarrhea, constipation, heart burn, nausea or vomiting  PHYSICAL EXAMINATION  VS:  See VS section     GENERAL: no acute distress, morbidly obese body habitus EYES: Normal sclerae, normal conjunctivae, no discharge  NECK: supple, trachea midline, no neck masses, no thyroid tenderness, no thyromegaly LYMPHATICS: No cervical lymphadenopathy, no supraclavicular lymphadenopathy RESPIRATORY: breathing is even & unlabored, BS CTAB CARDIAC: RRR, no murmur,no extra heart sounds, no edema GI: abdomen soft, normal  BS, no masses, no tenderness, no hepatomegaly, no splenomegaly PSYCHIATRIC: the patient is alert & oriented to person, affect & behavior appropriate  LABS/RADIOLOGY:  3/15 alb 3.1  11-14 total protein 5.6, albumin 3.2, alkaline phosphatase 136 otherwise liver profile normal  10-14 glucose 106, BUN 35, total protein 5.2, albumin 2.8, alkaline phosphatase 162 otherwise CMP normal, serum iron 69, TIBC 188, iron saturation 37%, ferritin 263, TSH 1.061, hemoglobin 10, MCV 99.4 otherwise CBC normal  8-14 hemoglobin 10.9, MCV 100.6 otherwise CBC normal  5-14 WBC 15.7, hemoglobin 11.8, MCV 90.8, platelets 165, glucose 161, BUN 27, creatinine 1.13 otherwise BMP normal 12/2011:  Hemoglobin 9.9, MCV 89.9, otherwise CBC normal.   11/2011:  BUN 35, creatinine 1.56, otherwise BMP normal.   BUN 38, creatinine 1.81, glucose 109, total protein 5.5, albumin 3, otherwise CMP normal.    Vitamin B12 level 601, folate greater than 20, ferritin 183.    ASSESSMENT/PLAN:  Diabetes mellitus.  Well controlled. Lantus was decreased  Atrial fibrillation.  Rate controlled.    Hypertension.  Well controlled.   Anemia of chronic disease.  Stable.  Continue iron. Check Hb level.  GERD.  Well controlled.     Constipation.  Well controlled.    Urinary incontinence.  Continue oxybutynin.    Insomnia.  Continue Ambien.  No complaints.   Check cbc  CPT CODE: [id number]

## 2013-07-19 ENCOUNTER — Non-Acute Institutional Stay (SKILLED_NURSING_FACILITY): Payer: PRIVATE HEALTH INSURANCE | Admitting: Internal Medicine

## 2013-07-19 DIAGNOSIS — I10 Essential (primary) hypertension: Secondary | ICD-10-CM

## 2013-07-19 DIAGNOSIS — E119 Type 2 diabetes mellitus without complications: Secondary | ICD-10-CM

## 2013-07-19 DIAGNOSIS — D638 Anemia in other chronic diseases classified elsewhere: Secondary | ICD-10-CM

## 2013-07-19 DIAGNOSIS — I4891 Unspecified atrial fibrillation: Secondary | ICD-10-CM

## 2013-07-19 NOTE — Progress Notes (Signed)
Patient ID: Kathryn [name], female   DOB: 01-22-1934, 80 y.o.   MRN: [id number]          PROGRESS NOTE  DATE:  07-19-13  FACILITY: [facility]   LEVEL OF CARE: SNF  Routine Visit  CHIEF COMPLAINT:  Manage atrial fibrillation, hypertension and diabetes mellitus.   HISTORY OF PRESENT ILLNESS:  REASSESSMENT OF ONGOING PROBLEM(S):  DM:pt's DM remains stable.  Pt denies polyuria, polydipsia, polyphagia, changes in vision or hypoglycemic episodes.  No complications noted from the medication presently being used.  Last hemoglobin A1c is: 02/2012:  Hemoglobin A1C 6.1, in 6/14 hemoglobin A1c 5.8, in 12-14 hemoglobin A1c 5.8, in 5-15 hemoglobin A1c 5.8  HTN: Pt 's HTN remains stable.  Denies CP, sob, DOE, pedal edema, headaches, dizziness or visual disturbances.  No complications from the medications currently being used.  Last BP : 133/53, 127/88, 155/75  ATRIAL FIBRILLATION: the patients atrial fibrillation remains stable.  The patient denies DOE, tachycardia, orthopnea, transient neurological sx, pedal edema, palpitations, & PNDs.  No complications noted from the medications currently being used.   PAST MEDICAL HISTORY : Reviewed.  No changes.  CURRENT MEDICATIONS: [facility]  REVIEW OF SYSTEMS:  GENERAL: no change in appetite, no fatigue, no weight changes, no fever, chills or weakness RESPIRATORY: no cough, SOB, DOE, wheezing, hemoptysis CARDIAC: no chest pain, edema or palpitations GI: no abdominal pain, diarrhea, constipation, heart burn, nausea or vomiting  PHYSICAL EXAMINATION  VS:  See VS section     GENERAL: no acute distress, morbidly obese body habitus EYES: Normal sclerae, normal conjunctivae, no discharge  NECK: supple, trachea midline, no neck masses, no thyroid tenderness, no thyromegaly LYMPHATICS: No cervical lymphadenopathy, no supraclavicular lymphadenopathy RESPIRATORY: breathing is even & unlabored, BS CTAB CARDIAC: Heart rate irregularly irregular, no  murmur,no extra heart sounds, no edema GI: abdomen soft, normal BS, no masses, no tenderness, no hepatomegaly, no splenomegaly PSYCHIATRIC: the patient is alert & oriented to person, affect & behavior appropriate  LABS/RADIOLOGY: 5-15 BMP normal 4-15 hemoglobin 10.4, MCV 99.4 otherwise CBC normal 3/15 alb 3.1  11-14 total protein 5.6, albumin 3.2, alkaline phosphatase 136 otherwise liver profile normal  10-14 glucose 106, BUN 35, total protein 5.2, albumin 2.8, alkaline phosphatase 162 otherwise CMP normal, serum iron 69, TIBC 188, iron saturation 37%, ferritin 263, TSH 1.061, hemoglobin 10, MCV 99.4 otherwise CBC normal  8-14 hemoglobin 10.9, MCV 100.6 otherwise CBC normal  5-14 WBC 15.7, hemoglobin 11.8, MCV 90.8, platelets 165, glucose 161, BUN 27, creatinine 1.13 otherwise BMP normal 12/2011:  Hemoglobin 9.9, MCV 89.9, otherwise CBC normal.   11/2011:  BUN 35, creatinine 1.56, otherwise BMP normal.   BUN 38, creatinine 1.81, glucose 109, total protein 5.5, albumin 3, otherwise CMP normal.    Vitamin B12 level 601, folate greater than 20, ferritin 183.    ASSESSMENT/PLAN:  Diabetes mellitus.  Well controlled. Lantus was decreased  Atrial fibrillation.  Rate controlled.    Hypertension.  Well controlled.   Anemia of chronic disease.  Stable.  Continue iron.   GERD.  Well controlled.     Constipation.  Well controlled.    Urinary incontinence.  Continue oxybutynin.    Insomnia.  Ambien discontinued. Trazodone was started.  Neuropathy -- Neurontin was increased  Check liver profile  CPT CODE: 35361  Edgar Frisk. Durwin Reges, [place] [phone]

## 2013-07-24 ENCOUNTER — Other Ambulatory Visit: Payer: Self-pay | Admitting: *Deleted

## 2013-07-24 MED ORDER — LORAZEPAM 1 MG PO TABS: ORAL_TABLET | ORAL | Status: DC

## 2013-08-07 ENCOUNTER — Non-Acute Institutional Stay (SKILLED_NURSING_FACILITY): Payer: PRIVATE HEALTH INSURANCE | Admitting: Internal Medicine

## 2013-08-07 DIAGNOSIS — I4891 Unspecified atrial fibrillation: Secondary | ICD-10-CM

## 2013-08-07 DIAGNOSIS — I10 Essential (primary) hypertension: Secondary | ICD-10-CM

## 2013-08-07 DIAGNOSIS — E119 Type 2 diabetes mellitus without complications: Secondary | ICD-10-CM

## 2013-08-07 DIAGNOSIS — D638 Anemia in other chronic diseases classified elsewhere: Secondary | ICD-10-CM

## 2013-08-07 NOTE — Progress Notes (Signed)
Patient ID: Kathryn [name], female   DOB: [birth year], 80 y.o.   MRN: [id number]           PROGRESS NOTE  DATE:  08-06-13  FACILITY: [facility]   LEVEL OF CARE: SNF  Routine Visit  CHIEF COMPLAINT:  Manage atrial fibrillation, hypertension and diabetes mellitus.   HISTORY OF PRESENT ILLNESS:  REASSESSMENT OF ONGOING PROBLEM(S):  DM:pt's DM remains stable.  Pt denies polyuria, polydipsia, polyphagia, changes in vision or hypoglycemic episodes.  No complications noted from the medication presently being used.  Last hemoglobin A1c is: 02/2012:  Hemoglobin A1C 6.1, in 6/14 hemoglobin A1c 5.8, in 12-14 hemoglobin A1c 5.8, in 5-15 hemoglobin A1c 5.8  HTN: Pt 's HTN remains stable.  Denies CP, sob, DOE, pedal edema, headaches, dizziness or visual disturbances.  No complications from the medications currently being used.  Last BP : 133/53, 127/88, 155/75, 137/79  ATRIAL FIBRILLATION: the patients atrial fibrillation remains stable.  The patient denies DOE, tachycardia, orthopnea, transient neurological sx, pedal edema, palpitations, & PNDs.  No complications noted from the medications currently being used.   PAST MEDICAL HISTORY : Reviewed.  No changes.  CURRENT MEDICATIONS: Reviewed per [facility]  REVIEW OF SYSTEMS:  GENERAL: no change in appetite, no fatigue, no weight changes, no fever, chills or weakness RESPIRATORY: no cough, SOB, DOE, wheezing, hemoptysis CARDIAC: no chest pain, edema or palpitations GI: no abdominal pain, diarrhea, constipation, heart burn, nausea or vomiting  PHYSICAL EXAMINATION  VS:  See VS section     GENERAL: no acute distress, morbidly obese body habitus EYES: Normal sclerae, normal conjunctivae, no discharge  NECK: supple, trachea midline, no neck masses, no thyroid tenderness, no thyromegaly LYMPHATICS: No cervical lymphadenopathy, no supraclavicular lymphadenopathy RESPIRATORY: breathing is even & unlabored, BS CTAB CARDIAC: Heart rate irregularly  irregular, no murmur,no extra heart sounds, no edema GI: abdomen soft, normal BS, no masses, no tenderness, no hepatomegaly, no splenomegaly PSYCHIATRIC: the patient is alert & oriented to person, affect & behavior appropriate  LABS/RADIOLOGY: 5-15 BMP normal, total protein 5.3, albumin 2.9, alkaline phosphatase 170 otherwise liver profile normal 4-15 hemoglobin 10.4, MCV 99.4 otherwise CBC normal 3/15 alb 3.1  11-14 total protein 5.6, albumin 3.2, alkaline phosphatase 136 otherwise liver profile normal  10-14 glucose 106, BUN 35, total protein 5.2, albumin 2.8, alkaline phosphatase 162 otherwise CMP normal, serum iron 69, TIBC 188, iron saturation 37%, ferritin 263, TSH 1.061, hemoglobin 10, MCV 99.4 otherwise CBC normal  8-14 hemoglobin 10.9, MCV 100.6 otherwise CBC normal  5-14 WBC 15.7, hemoglobin 11.8, MCV 90.8, platelets 165, glucose 161, BUN 27, creatinine 1.13 otherwise BMP normal 12/2011:  Hemoglobin 9.9, MCV 89.9, otherwise CBC normal.   11/2011:  BUN 35, creatinine 1.56, otherwise BMP normal.   BUN 38, creatinine 1.81, glucose 109, total protein 5.5, albumin 3, otherwise CMP normal.    Vitamin B12 level 601, folate greater than 20, ferritin 183.    ASSESSMENT/PLAN:  Diabetes mellitus.  Well controlled. Lantus was decreased  Atrial fibrillation.  Rate controlled.    Hypertension.  Well controlled.   Anemia of chronic disease.  Stable.  Continue iron.   GERD.  Well controlled.     Constipation.  Well controlled.    Urinary incontinence.  Continue oxybutynin.    Insomnia-on Trazodone  Neuropathy -- on Neurontin  CPT CODE: 74259  Edgar Frisk. Durwin Reges, [place] [phone]

## 2013-08-09 ENCOUNTER — Encounter: Payer: Self-pay | Admitting: *Deleted

## 2013-09-20 ENCOUNTER — Non-Acute Institutional Stay (SKILLED_NURSING_FACILITY): Payer: PRIVATE HEALTH INSURANCE | Admitting: Internal Medicine

## 2013-09-20 DIAGNOSIS — I4891 Unspecified atrial fibrillation: Secondary | ICD-10-CM

## 2013-09-20 DIAGNOSIS — E119 Type 2 diabetes mellitus without complications: Secondary | ICD-10-CM

## 2013-09-20 DIAGNOSIS — I482 Chronic atrial fibrillation, unspecified: Secondary | ICD-10-CM

## 2013-09-20 DIAGNOSIS — D638 Anemia in other chronic diseases classified elsewhere: Secondary | ICD-10-CM

## 2013-09-20 DIAGNOSIS — I10 Essential (primary) hypertension: Secondary | ICD-10-CM

## 2013-09-21 NOTE — Progress Notes (Signed)
Patient ID: Kathryn [name], female   DOB: Jun 13, 1933, 80 y.o.   MRN: [id number]          PROGRESS NOTE  DATE:  09-20-13  FACILITY: [facility]   LEVEL OF CARE: SNF  Routine Visit  CHIEF COMPLAINT:  Manage atrial fibrillation, hypertension and diabetes mellitus.   HISTORY OF PRESENT ILLNESS:  REASSESSMENT OF ONGOING PROBLEM(S):  DM:pt's DM remains stable.  Pt denies polyuria, polydipsia, polyphagia, changes in vision or hypoglycemic episodes.  No complications noted from the medication presently being used.  Last hemoglobin A1c is: 02/2012:  Hemoglobin A1C 6.1, in 6/14 hemoglobin A1c 5.8, in 12-14 hemoglobin A1c 5.8, in 5-15 hemoglobin A1c 5.8  HTN: Pt 's HTN remains stable.  Denies CP, sob, DOE, pedal edema, headaches, dizziness or visual disturbances.  No complications from the medications currently being used.  Last BP : 133/53, 127/88, 155/75, 137/79, 116/65  ATRIAL FIBRILLATION: the patients atrial fibrillation remains stable.  The patient denies DOE, tachycardia, orthopnea, transient neurological sx, pedal edema, palpitations, & PNDs.  No complications noted from the medications currently being used.   PAST MEDICAL HISTORY : Reviewed.  No changes.  CURRENT MEDICATIONS: Reviewed per [facility]  REVIEW OF SYSTEMS:  GENERAL: no change in appetite, no fatigue, no weight changes, no fever, chills or weakness RESPIRATORY: no cough, SOB, DOE, wheezing, hemoptysis CARDIAC: no chest pain, edema or palpitations GI: no abdominal pain, diarrhea, constipation, heart burn, nausea or vomiting  PHYSICAL EXAMINATION  VS:  See VS section     GENERAL: no acute distress, morbidly obese body habitus EYES: Normal sclerae, normal conjunctivae, no discharge  NECK: supple, trachea midline, no neck masses, no thyroid tenderness, no thyromegaly LYMPHATICS: No cervical lymphadenopathy, no supraclavicular lymphadenopathy RESPIRATORY: breathing is even & unlabored, BS CTAB CARDIAC: Heart rate  irregularly irregular, no murmur,no extra heart sounds, no edema GI: abdomen soft, normal BS, no masses, no tenderness, no hepatomegaly, no splenomegaly PSYCHIATRIC: the patient is alert & oriented to person, affect & behavior appropriate  LABS/RADIOLOGY: 7-15 hemoglobin 9.7, MCV 99.3, platelets 144, WBC 4.4, BUN 39, total protein 5.5, albumin 2.9, alkaline phosphatase 189 otherwise CMP normal, TIBC 231, serum iron 78, iron saturation 34%, ferritin 226, vitamin B12 level 532, RBC folate 1347 5-15 BMP normal, total protein 5.3, albumin 2.9, alkaline phosphatase 170 otherwise liver profile normal 4-15 hemoglobin 10.4, MCV 99.4 otherwise CBC normal 3/15 alb 3.1  11-14 total protein 5.6, albumin 3.2, alkaline phosphatase 136 otherwise liver profile normal  10-14 glucose 106, BUN 35, total protein 5.2, albumin 2.8, alkaline phosphatase 162 otherwise CMP normal, serum iron 69, TIBC 188, iron saturation 37%, ferritin 263, TSH 1.061, hemoglobin 10, MCV 99.4 otherwise CBC normal  8-14 hemoglobin 10.9, MCV 100.6 otherwise CBC normal  5-14 WBC 15.7, hemoglobin 11.8, MCV 90.8, platelets 165, glucose 161, BUN 27, creatinine 1.13 otherwise BMP normal 12/2011:  Hemoglobin 9.9, MCV 89.9, otherwise CBC normal.   11/2011:  BUN 35, creatinine 1.56, otherwise BMP normal.   BUN 38, creatinine 1.81, glucose 109, total protein 5.5, albumin 3, otherwise CMP normal.    Vitamin B12 level 601, folate greater than 20, ferritin 183.    ASSESSMENT/PLAN:  Diabetes mellitus.  Well controlled.   Atrial fibrillation.  Rate controlled.    Hypertension.  Well controlled.   Anemia of chronic disease.  Stable.  Continue iron.   GERD.  Well controlled.     Constipation.  Well controlled.    Urinary incontinence.  Continue oxybutynin.    Insomnia-on  Trazodone  Neuropathy -- on Neurontin  CPT CODE: [id number]  Edgar Frisk. Durwin Reges, [place] [phone]

## 2013-11-01 ENCOUNTER — Non-Acute Institutional Stay (SKILLED_NURSING_FACILITY): Payer: PRIVATE HEALTH INSURANCE | Admitting: Internal Medicine

## 2013-11-01 DIAGNOSIS — I10 Essential (primary) hypertension: Secondary | ICD-10-CM

## 2013-11-01 DIAGNOSIS — E119 Type 2 diabetes mellitus without complications: Secondary | ICD-10-CM

## 2013-11-01 DIAGNOSIS — D638 Anemia in other chronic diseases classified elsewhere: Secondary | ICD-10-CM

## 2013-11-01 DIAGNOSIS — I482 Chronic atrial fibrillation, unspecified: Secondary | ICD-10-CM

## 2013-11-01 DIAGNOSIS — I4891 Unspecified atrial fibrillation: Secondary | ICD-10-CM

## 2013-11-05 NOTE — Progress Notes (Signed)
Patient ID: STEVEN VEAZIE, female   DOB: 1933/03/25, 80 y.o.   MRN: [id number]          PROGRESS NOTE  DATE:  11-01-13  FACILITY: [facility]   LEVEL OF CARE: SNF  Routine Visit  CHIEF COMPLAINT:  Manage atrial fibrillation, hypertension and diabetes mellitus.   HISTORY OF PRESENT ILLNESS:  REASSESSMENT OF ONGOING PROBLEM(S):  DM:pt's DM remains stable.  Pt denies polyuria, polydipsia, polyphagia, changes in vision or hypoglycemic episodes.  No complications noted from the medication presently being used.  Last hemoglobin A1c is: 02/2012:  Hemoglobin A1C 6.1, in 6/14 hemoglobin A1c 5.8, in 12-14 hemoglobin A1c 5.8, in 5-15 hemoglobin A1c 5.8  HTN: Pt 's HTN remains stable.  Denies CP, sob, DOE, pedal edema, headaches, dizziness or visual disturbances.  No complications from the medications currently being used.  Last BP : 133/53, 127/88, 155/75, 137/79, 116/65, 159/65, 161/25  ATRIAL FIBRILLATION: the patients atrial fibrillation remains stable.  The patient denies DOE, tachycardia, orthopnea, transient neurological sx, pedal edema, palpitations, & PNDs.  No complications noted from the medications currently being used.   PAST MEDICAL HISTORY : Reviewed.  No changes.  CURRENT MEDICATIONS: Reviewed per [facility]  REVIEW OF SYSTEMS:  GENERAL: no change in appetite, no fatigue, no weight changes, no fever, chills or weakness RESPIRATORY: no cough, SOB, DOE, wheezing, hemoptysis CARDIAC: no chest pain, edema or palpitations GI: no abdominal pain, diarrhea, constipation, heart burn, nausea or vomiting  PHYSICAL EXAMINATION  VS:  See VS section     GENERAL: no acute distress, morbidly obese body habitus EYES: Normal sclerae, normal conjunctivae, no discharge  NECK: supple, trachea midline, no neck masses, no thyroid tenderness, no thyromegaly LYMPHATICS: No cervical lymphadenopathy, no supraclavicular lymphadenopathy RESPIRATORY: breathing is even & unlabored, BS CTAB CARDIAC: Heart  rate irregularly irregular, no murmur,no extra heart sounds, no edema GI: abdomen soft, normal BS, no masses, no tenderness, no hepatomegaly, no splenomegaly PSYCHIATRIC: the patient is alert & oriented to person, affect & behavior appropriate  LABS/RADIOLOGY: 7-15 hemoglobin 9.7, MCV 99.3, platelets 144, WBC 4.4, BUN 39, total protein 5.5, albumin 2.9, alkaline phosphatase 189 otherwise CMP normal, TIBC 231, serum iron 78, iron saturation 34%, ferritin 226, vitamin B12 level 532, RBC folate 1347 5-15 BMP normal, total protein 5.3, albumin 2.9, alkaline phosphatase 170 otherwise liver profile normal 4-15 hemoglobin 10.4, MCV 99.4 otherwise CBC normal 3/15 alb 3.1  11-14 total protein 5.6, albumin 3.2, alkaline phosphatase 136 otherwise liver profile normal  10-14 glucose 106, BUN 35, total protein 5.2, albumin 2.8, alkaline phosphatase 162 otherwise CMP normal, serum iron 69, TIBC 188, iron saturation 37%, ferritin 263, TSH 1.061, hemoglobin 10, MCV 99.4 otherwise CBC normal  8-14 hemoglobin 10.9, MCV 100.6 otherwise CBC normal  5-14 WBC 15.7, hemoglobin 11.8, MCV 90.8, platelets 165, glucose 161, BUN 27, creatinine 1.13 otherwise BMP normal 12/2011:  Hemoglobin 9.9, MCV 89.9, otherwise CBC normal.   11/2011:  BUN 35, creatinine 1.56, otherwise BMP normal.   BUN 38, creatinine 1.81, glucose 109, total protein 5.5, albumin 3, otherwise CMP normal.    Vitamin B12 level 601, folate greater than 20, ferritin 183.    ASSESSMENT/PLAN:  Diabetes mellitus.  Well controlled.   Atrial fibrillation.  Rate controlled.    Hypertension. uncontrolled. Start HCTZ 25 mg daily  Anemia of chronic disease.  Stable.  Continue iron.   GERD.  Well controlled.     Constipation.  Well controlled.    Urinary incontinence.  Continue oxybutynin.  Insomnia-on Trazodone  Neuropathy -- on Neurontin  Allergic rhinitis-cetrizine was started  Check BMP on 11-05-13  CPT CODE: 15183  Edgar Frisk.  Durwin Reges, [place] [phone]

## 2013-11-20 ENCOUNTER — Non-Acute Institutional Stay (SKILLED_NURSING_FACILITY): Payer: PRIVATE HEALTH INSURANCE | Admitting: Internal Medicine

## 2013-11-20 DIAGNOSIS — I482 Chronic atrial fibrillation, unspecified: Secondary | ICD-10-CM

## 2013-11-20 DIAGNOSIS — I10 Essential (primary) hypertension: Secondary | ICD-10-CM

## 2013-11-20 DIAGNOSIS — I4891 Unspecified atrial fibrillation: Secondary | ICD-10-CM

## 2013-11-20 DIAGNOSIS — D638 Anemia in other chronic diseases classified elsewhere: Secondary | ICD-10-CM

## 2013-11-20 DIAGNOSIS — E119 Type 2 diabetes mellitus without complications: Secondary | ICD-10-CM

## 2013-11-20 NOTE — Progress Notes (Signed)
Patient ID: Kathryn [name], female   DOB: May 25, 1933, 80 y.o.   MRN: [id number]          PROGRESS NOTE  DATE:  11-20-13  FACILITY: [facility]   LEVEL OF CARE: SNF  Routine Visit  CHIEF COMPLAINT:  Manage atrial fibrillation, hypertension and diabetes mellitus.   HISTORY OF PRESENT ILLNESS:  REASSESSMENT OF ONGOING PROBLEM(S):  DM:pt's DM remains stable.  Pt denies polyuria, polydipsia, polyphagia, changes in vision or hypoglycemic episodes.  No complications noted from the medication presently being used.  Last hemoglobin A1c is: 02/2012:  Hemoglobin A1C 6.1, in 6/14 hemoglobin A1c 5.8, in 12-14 hemoglobin A1c 5.8, in 5-15 hemoglobin A1c 5.8  HTN: Pt 's HTN remains stable.  Denies CP, sob, DOE, pedal edema, headaches, dizziness or visual disturbances.  No complications from the medications currently being used.  Last BP : 133/53, 127/88, 155/75, 137/79, 116/65, 159/65, 161/25, 117/59  ATRIAL FIBRILLATION: the patients atrial fibrillation remains stable.  The patient denies DOE, tachycardia, orthopnea, transient neurological sx, pedal edema, palpitations, & PNDs.  No complications noted from the medications currently being used.   PAST MEDICAL HISTORY : Reviewed.  No changes.  CURRENT MEDICATIONS: Reviewed per [facility]  REVIEW OF SYSTEMS:  GENERAL: no change in appetite, no fatigue, no weight changes, no fever, chills or weakness RESPIRATORY: no cough, SOB, DOE, wheezing, hemoptysis CARDIAC: no chest pain, edema or palpitations GI: no abdominal pain, diarrhea, constipation, heart burn, nausea or vomiting  PHYSICAL EXAMINATION  VS:  See VS section     GENERAL: no acute distress, morbidly obese body habitus EYES: Normal sclerae, normal conjunctivae, no discharge  NECK: supple, trachea midline, no neck masses, no thyroid tenderness, no thyromegaly LYMPHATICS: No cervical lymphadenopathy, no supraclavicular lymphadenopathy RESPIRATORY: breathing is even & unlabored, BS  CTAB CARDIAC: Heart rate irregularly irregular, no murmur,no extra heart sounds, no edema GI: abdomen soft, normal BS, no masses, no tenderness, no hepatomegaly, no splenomegaly PSYCHIATRIC: the patient is alert & oriented to person, affect & behavior appropriate  LABS/RADIOLOGY: 8-15 BUN 44 otherwise BMP normal 7-15 hemoglobin 9.7, MCV 99.3, platelets 144, WBC 4.4, BUN 39, total protein 5.5, albumin 2.9, alkaline phosphatase 189 otherwise CMP normal, TIBC 231, serum iron 78, iron saturation 34%, ferritin 226, vitamin B12 level 532, RBC folate 1347 5-15 BMP normal, total protein 5.3, albumin 2.9, alkaline phosphatase 170 otherwise liver profile normal 4-15 hemoglobin 10.4, MCV 99.4 otherwise CBC normal 3/15 alb 3.1  11-14 total protein 5.6, albumin 3.2, alkaline phosphatase 136 otherwise liver profile normal  10-14 glucose 106, BUN 35, total protein 5.2, albumin 2.8, alkaline phosphatase 162 otherwise CMP normal, serum iron 69, TIBC 188, iron saturation 37%, ferritin 263, TSH 1.061, hemoglobin 10, MCV 99.4 otherwise CBC normal  8-14 hemoglobin 10.9, MCV 100.6 otherwise CBC normal  5-14 WBC 15.7, hemoglobin 11.8, MCV 90.8, platelets 165, glucose 161, BUN 27, creatinine 1.13 otherwise BMP normal 12/2011:  Hemoglobin 9.9, MCV 89.9, otherwise CBC normal.   11/2011:  BUN 35, creatinine 1.56, otherwise BMP normal.   BUN 38, creatinine 1.81, glucose 109, total protein 5.5, albumin 3, otherwise CMP normal.    Vitamin B12 level 601, folate greater than 20, ferritin 183.    ASSESSMENT/PLAN:  Diabetes mellitus.  Well controlled.   Atrial fibrillation.  Rate controlled.    Hypertension. Well controlled.   Anemia of chronic disease.  Stable.  Continue iron.   GERD.  Well controlled.     Constipation.  Well controlled.    Urinary  incontinence.  Continue oxybutynin.    Insomnia-on Trazodone  Neuropathy -- on Neurontin  Allergic rhinitis-on cetrizine  CPT CODE: 15379  Kathryn Broski Y.  Durwin [name], [place] [phone]

## 2013-12-18 ENCOUNTER — Other Ambulatory Visit: Payer: Self-pay | Admitting: *Deleted

## 2013-12-18 MED ORDER — LORAZEPAM 2 MG/ML IJ SOLN: INTRAMUSCULAR | Status: DC

## 2013-12-18 NOTE — Telephone Encounter (Signed)
[facility] 

## 2013-12-20 ENCOUNTER — Non-Acute Institutional Stay (SKILLED_NURSING_FACILITY): Payer: PRIVATE HEALTH INSURANCE | Admitting: Internal Medicine

## 2013-12-20 DIAGNOSIS — I482 Chronic atrial fibrillation, unspecified: Secondary | ICD-10-CM

## 2013-12-20 DIAGNOSIS — I10 Essential (primary) hypertension: Secondary | ICD-10-CM

## 2013-12-20 DIAGNOSIS — N289 Disorder of kidney and ureter, unspecified: Secondary | ICD-10-CM

## 2013-12-20 DIAGNOSIS — E119 Type 2 diabetes mellitus without complications: Secondary | ICD-10-CM

## 2013-12-20 LAB — HEMOGLOBIN A1C: Hgb A1c MFr Bld: 6 % (ref 4.0–6.0)

## 2013-12-21 DIAGNOSIS — E1129 Type 2 diabetes mellitus with other diabetic kidney complication: Secondary | ICD-10-CM | POA: Insufficient documentation

## 2013-12-21 NOTE — Progress Notes (Signed)
Patient ID: DORREEN VALIENTE, female   DOB: 08-Nov-1933, 80 y.o.   MRN: [id number]          PROGRESS NOTE  DATE:  12-20-13  FACILITY: [facility]   LEVEL OF CARE: SNF  Routine Visit  CHIEF COMPLAINT:  Manage atrial fibrillation, hypertension and diabetes mellitus.   HISTORY OF PRESENT ILLNESS:  REASSESSMENT OF ONGOING PROBLEM(S):  DM:pt's DM remains stable.  Pt denies polyuria, polydipsia, polyphagia, changes in vision or hypoglycemic episodes.  No complications noted from the medication presently being used.  Last hemoglobin A1c is: 02/2012:  Hemoglobin A1C 6.1, in 6/14 hemoglobin A1c 5.8, in 12-14 hemoglobin A1c 5.8, in 5-15 hemoglobin A1c 5.8  HTN: Pt 's HTN remains stable.  Denies CP, sob, DOE, pedal edema, headaches, dizziness or visual disturbances.  No complications from the medications currently being used.  Last BP : 133/53, 127/88, 155/75, 137/79, 116/65, 159/65, 161/25, 117/59, 123/74  ATRIAL FIBRILLATION: the patients atrial fibrillation remains stable.  The patient denies DOE, tachycardia, orthopnea, transient neurological sx, pedal edema, palpitations, & PNDs.  No complications noted from the medications currently being used.   PAST MEDICAL HISTORY : Reviewed.  No changes.  CURRENT MEDICATIONS: Reviewed per [facility]  REVIEW OF SYSTEMS:  GENERAL: no change in appetite, no fatigue, no weight changes, no fever, chills or weakness RESPIRATORY: no cough, SOB, DOE, wheezing, hemoptysis CARDIAC: no chest pain, edema or palpitations GI: no abdominal pain, diarrhea, constipation, heart burn, nausea or vomiting  PHYSICAL EXAMINATION  VS:  See VS section     GENERAL: no acute distress, morbidly obese body habitus EYES: Normal sclerae, normal conjunctivae, no discharge  NECK: supple, trachea midline, no neck masses, no thyroid tenderness, no thyromegaly LYMPHATICS: No cervical lymphadenopathy, no supraclavicular lymphadenopathy RESPIRATORY: breathing is even & unlabored, BS  CTAB CARDIAC: Heart rate irregularly irregular, no murmur,no extra heart sounds, no edema GI: abdomen soft, normal BS, no masses, no tenderness, no hepatomegaly, no splenomegaly PSYCHIATRIC: the patient is alert & oriented to person, affect & behavior appropriate  LABS/RADIOLOGY: 10-15 hemoglobin 10.4, MCV 11.6 otherwise CBC normal, glucose 122, BUN 15, creatinine 2.4 otherwise BMP normal 8-15 BUN 44 otherwise BMP normal 7-15 hemoglobin 9.7, MCV 99.3, platelets 144, WBC 4.4, BUN 39, total protein 5.5, albumin 2.9, alkaline phosphatase 189 otherwise CMP normal, TIBC 231, serum iron 78, iron saturation 34%, ferritin 226, vitamin B12 level 532, RBC folate 1347 5-15 BMP normal, total protein 5.3, albumin 2.9, alkaline phosphatase 170 otherwise liver profile normal 4-15 hemoglobin 10.4, MCV 99.4 otherwise CBC normal 3/15 alb 3.1  11-14 total protein 5.6, albumin 3.2, alkaline phosphatase 136 otherwise liver profile normal  10-14 glucose 106, BUN 35, total protein 5.2, albumin 2.8, alkaline phosphatase 162 otherwise CMP normal, serum iron 69, TIBC 188, iron saturation 37%, ferritin 263, TSH 1.061, hemoglobin 10, MCV 99.4 otherwise CBC normal  8-14 hemoglobin 10.9, MCV 100.6 otherwise CBC normal  5-14 WBC 15.7, hemoglobin 11.8, MCV 90.8, platelets 165, glucose 161, BUN 27, creatinine 1.13 otherwise BMP normal 12/2011:  Hemoglobin 9.9, MCV 89.9, otherwise CBC normal.   11/2011:  BUN 35, creatinine 1.56, otherwise BMP normal.   BUN 38, creatinine 1.81, glucose 109, total protein 5.5, albumin 3, otherwise CMP normal.    Vitamin B12 level 601, folate greater than 20, ferritin 183.    ASSESSMENT/PLAN:  Diabetes mellitus.  Well controlled.   Atrial fibrillation.  Rate controlled.    Hypertension. Well controlled.   Acute renal failure-IV fluids given. Recheck renal functions pending.  Anemia of chronic disease.  Stable.  Continue iron.   GERD.  Well controlled.     Constipation.  Well  controlled.    Urinary incontinence.  Continue oxybutynin.    Insomnia-on Trazodone  Neuropathy -- on Neurontin  Allergic rhinitis-on cetrizine  CPT CODE: 69794  Gayani Y. Durwin Reges, [place] [phone]

## 2014-01-01 LAB — CBC AND DIFFERENTIAL
HEMATOCRIT: 26 % — AB (ref 36–46)
Hemoglobin: 9 g/dL — AB (ref 12.0–16.0)
PLATELETS: 159 10*3/uL (ref 150–399)
WBC: 6.5 10*3/mL

## 2014-01-02 ENCOUNTER — Other Ambulatory Visit: Payer: Self-pay | Admitting: Nurse Practitioner

## 2014-01-07 LAB — BASIC METABOLIC PANEL
BUN: 70 mg/dL — AB (ref 4–21)
Creatinine: 1.8 mg/dL — AB (ref 0.5–1.1)
Glucose: 127 mg/dL
Potassium: 4.3 mmol/L (ref 3.4–5.3)
Sodium: 138 mmol/L (ref 137–147)

## 2014-01-14 ENCOUNTER — Non-Acute Institutional Stay (SKILLED_NURSING_FACILITY): Payer: PRIVATE HEALTH INSURANCE | Admitting: Internal Medicine

## 2014-01-14 ENCOUNTER — Encounter: Payer: Self-pay | Admitting: Internal Medicine

## 2014-01-14 DIAGNOSIS — D638 Anemia in other chronic diseases classified elsewhere: Secondary | ICD-10-CM

## 2014-01-14 DIAGNOSIS — R682 Dry mouth, unspecified: Secondary | ICD-10-CM

## 2014-01-14 DIAGNOSIS — I10 Essential (primary) hypertension: Secondary | ICD-10-CM

## 2014-01-14 DIAGNOSIS — E118 Type 2 diabetes mellitus with unspecified complications: Secondary | ICD-10-CM

## 2014-01-14 DIAGNOSIS — L8993 Pressure ulcer of unspecified site, stage 3: Secondary | ICD-10-CM

## 2014-01-14 DIAGNOSIS — I4891 Unspecified atrial fibrillation: Secondary | ICD-10-CM

## 2014-01-14 DIAGNOSIS — K219 Gastro-esophageal reflux disease without esophagitis: Secondary | ICD-10-CM

## 2014-01-14 NOTE — Progress Notes (Signed)
Patient ID: Kathryn [name], female   DOB: 12-Oct-1933, 80 y.o.   MRN: [id number]   Place of Service: [facility]  Allergies  Allergen Reactions  . Celebrex [Celecoxib]   . Nsaids   . Sulfa Antibiotics     Code Status: Full Code  Goals of Care: Longevity/Long term care  Chief Complaint  Patient presents with  . Medical Management of Chronic Issues    DM2, Afib, HTN, Anemia, GERD    HPI 80 y.o. female with PMH of DM2, afib, HTN, anemia, GERD among many others is being seen for a routine visit. Weight stable over the past 2 weeks. No recent falls or new skin concerns reported. No change in behaviors or functional status reported. No concerns from nursing staff. Patient reported still having a small pressure ulcer on her bottom but it is healing.   Review of Systems Constitutional: Negative for fever, chills, and fatigue. HENT: Negative for congestion, and sore throat. Positive for cough (improving). Positive for dry mouth Eyes: Negative for eye pain, eye discharge, and visual disturbance  Cardiovascular: Negative for chest pain, palpitations, and leg swelling Respiratory: Negative cough, shortness of breath, and wheezing.  Gastrointestinal: Negative for nausea and vomiting. Negative for abdominal pain, diarrhea and constipation.  Musculoskeletal: Negative for back pain, joint pain, and joint swelling  Neurological: Negative for dizziness and headache Skin: Negative for rash Psychiatric: Negative for depression  Past Medical History  Diagnosis Date  . Osteoporosis   . DM (diabetes mellitus)   . HTN (hypertension)     unspecified  . Atrial fibrillation   . Macular degeneration   . Osteoarthritis   . Edema   . Anemia   . Hydronephrosis     left  . UTI (lower urinary tract infection)   . Pressure ulcer of back     Past Surgical History  Procedure Laterality Date  . Replacement total knee  08/2007    bilateral  . Appendectomy    . Cholecystectomy      . Colonoscopy    . Upper gastrointestinal endoscopy    . Cystoscopy w/ ureteral stent placement  04/29/2011    Procedure: CYSTOSCOPY WITH RETROGRADE PYELOGRAM/URETERAL STENT PLACEMENT;  Surgeon: Bernestine Amass, MD;  Location: [facility];  Service: Urology;  Laterality: Bilateral;  bilateral retrograde, double j stent. Cystogram   . Ureteroscopy  04/29/2011    Procedure: URETEROSCOPY;  Surgeon: Bernestine Amass, MD;  Location: [facility];  Service: Urology;  Laterality: Right;  . Cystoscopy with ureteroscopy  05/31/2011    Procedure: CYSTOSCOPY WITH URETEROSCOPY;  Surgeon: Bernestine Amass, MD;  Location: [facility];  Service: Urology;  Laterality: Right;  Ureteral biopsy Right      History   Social History  . Marital Status: Married    Spouse Name: N/A    Number of Children: 1  . Years of Education: N/A   Occupational History  . Retired   .     Social History Main Topics  . Smoking status: Never Smoker   . Smokeless tobacco: Never Used  . Alcohol Use: No  . Drug Use: No  . Sexual Activity: No   Other Topics Concern  . Not on file   Social History Narrative      Medication List       This list is accurate as of: 01/14/14 12:19 PM.  Always use your most recent med list.  ascorbic acid 500 MG tablet  Commonly known as:  VITAMIN C  Take 500 mg by mouth 2 (two) times daily.     BIOFREEZE 4 % Gel  Generic drug:  Menthol (Topical Analgesic)  Apply topically. Apply to right shoulder twice daily for pain.     Calcium Carbonate-Vitamin D 600-400 MG-UNIT per tablet  Take 1 tablet by mouth 2 (two) times daily.     cetirizine 10 MG tablet  Commonly known as:  ZYRTEC  Take 10 mg by mouth daily.     DULoxetine 60 MG capsule  Commonly known as:  CYMBALTA  Take 60 mg by mouth daily. For depression     ferrous sulfate 325 (65 FE) MG tablet  Take 325 mg by mouth 2 (two) times daily with a meal. For anemia     gabapentin 100 MG capsule  Commonly known as:  NEURONTIN   Take 300 mg by mouth 2 (two) times daily. Give patient 3 tablet twice daily to equal 300 mg for neuropathy.     HYDROcodone-acetaminophen 5-325 MG per tablet  Commonly known as:  NORCO/VICODIN  Take 1 tablet by mouth 2 (two) times daily. And 1 tablet Q6H PRN     insulin aspart 100 UNIT/ML injection  Commonly known as:  novoLOG  Inject 3 Units into the skin 3 (three) times daily before meals.     insulin glargine 100 UNIT/ML injection  Commonly known as:  LANTUS  Inject 15 Units into the skin at bedtime.     losartan 100 MG tablet  Commonly known as:  COZAAR  Take 50 mg by mouth daily. For HTN     metoprolol 50 MG tablet  Commonly known as:  LOPRESSOR  Take 50 mg by mouth 2 (two) times daily. Hold for HR < 60     multivitamin with minerals Tabs tablet  Take 1 tablet by mouth every morning.     NUTRITIONAL SUPPLEMENT PO  Take 90 mLs by mouth 4 (four) times daily. Medpass Sugar Free.     oxybutynin 5 MG tablet  Commonly known as:  DITROPAN  Take 5 mg by mouth daily. For bladder spasms.     pantoprazole 40 MG tablet  Commonly known as:  PROTONIX  Take 40 mg by mouth every morning. For GERD     polyethylene glycol packet  Commonly known as:  MIRALAX / GLYCOLAX  Take 17 g by mouth every morning. For constipation     PROCEL 100 Powd  Take 2 scoop by mouth 2 (two) times daily. For wound healing and increase protein levels     sennosides-docusate sodium 8.6-50 MG tablet  Commonly known as:  SENOKOT-S  Take 2 tablets by mouth at bedtime. For constipation     simethicone 80 MG chewable tablet  Commonly known as:  MYLICON  Chew 409 mg by mouth 3 (three) times daily. For gas pains.     traZODone 25 mg Tabs tablet  Commonly known as:  DESYREL  Take 25 mg by mouth at bedtime. For insomnia     Vitamin D3 50000 UNITS Caps  Take 1 capsule by mouth every 30 (thirty) days. On the 86 th of the month        Physical Exam Filed Vitals:   01/14/14 1153  BP: 147/59  Pulse:  60  Temp: 98.7 F (37.1 C)  Resp: 19   Constitutional: WDWN elderly female in no acute distress. Conversant and pleasant.  HEENT: Normocephalic and atraumatic. PERRL. EOM intact.  No icterus. No nasal discharge or sinus tenderness. Oral mucosa dry. Posterior pharynx clear of any exudate or lesions. Poor dentition Neck: Supple and nontender. No lymphadenopathy, masses, or thyromegaly. No JVD or carotid bruits. Cardiac: Normal S1, S2. RRR without appreciable murmurs, rubs, or gallops. Distal pulses intact. No dependent edema.  Lungs: No respiratory distress. Breath sounds clear bilaterally without rales, rhonchi, or wheezes. Abdomen: Audible bowel sounds in all quadrants. Soft, nontender, nondistended. No palpable mass.  Musculoskeletal: Normal range of motion. No joint erythema or tenderness. Spine and Back: Normal spinal profile. No scoliosis or kyphosis. No tenderness over spines. No CVA tenderness.  Skin: Warm and dry. No rash noted.   Neurological: Alert and oriented to person, place, and time.  Psychiatric: Judgment and insight adequate. Appropriate mood and affect.   Labs Reviewed CBC Latest Ref Rng 01/01/2014 07/22/2011 07/01/2011  WBC - 6.5 9.1 7.2  Hemoglobin 12.0 - 16.0 g/dL 9.0(A) 11.7 11.4(L)  Hematocrit 36 - 46 % 26(A) 33.4(L) 33.6(L)  Platelets 150 - 399 K/L 159 246 168    CMP     Component Value Date/Time   NA 138 01/07/2014   NA 132* 05/31/2011 0850   K 4.3 01/07/2014   CL 98 05/31/2011 0850   CO2 26 05/31/2011 0850   GLUCOSE 121* 05/31/2011 0850   BUN 70* 01/07/2014   BUN 24* 05/31/2011 0850   CREATININE 1.8* 01/07/2014   CREATININE 1.29* 05/31/2011 0850   CALCIUM 9.9 05/31/2011 0850   CALCIUM 8.2* 10/10/2010 1613   PROT 6.5 05/31/2011 0850   ALBUMIN 2.8* 05/31/2011 0850   AST 20 05/31/2011 0850   ALT 13 05/31/2011 0850   ALKPHOS 85 05/31/2011 0850   BILITOT 0.6 05/31/2011 0850   GFRNONAA 39* 05/31/2011 0850   GFRAA 45* 05/31/2011 0850   Lab Results   Component Value Date   HGBA1C 6.0 12/20/2013   Lab Results  Component Value Date   TSH 0.692 10/10/2010    Assessment & Plan  1. Type 2 diabetes mellitus with complication Stable. Last A1c 6.0 on 12/20/2013. CBGs mostly in 200s with some in 300s.  Lantus was recently increased from 10 units to 15 units daily on 01/11/14. Continue same Lantus regimen and novolog 3 units TID with meals for cbgs >200 for now. Currently not on statin for lipid. Will check lipid panel and initiate statin as indicated. Will discontinue ARB (losartan) secondary to severe renal impairment.  Eye and foot exam up to date. Will check urine microalbumin/creatinine ratio. Continue to monitor.   2. Essential hypertension, benign Stable. BPs mostly in 120s/70s. Continue lopressor 50mg  bid. Will d/c losartan 50mg  daily secondary to severe renal impairment. Start norvasc 10mg  daily and monitor BP.   3. Gastroesophageal reflux disease without esophagitis Stable. Asymptomatic. Will discontinue protonix.   4. Anemia, chronic disease Stable. Most likely in setting of CKD. Macrocytic cells with elevated ferritin level. Will discontinue ferrous sulfate 325mg  BID. Check vit B12, folate, and TSH levels and provide appropriate supplement as indicated.   5. Atrial fibrillation, unspecified Stable. Rate-controlled. Continue lopressor 50mg  BID and monitor. Not on anticoagulant. She was on warfarin in the past but it was discontinue r/t to anemia.   6. Stage 3 pressure  Stable. Continue dressing change with moistened collagen daily and bordered foam dressing. Continue procel 285mg  twice daily and monitor.   7. Dry mouth ACT dry mouth lozenges PRN. Encourage increasing fluid intake. Continue to monitor.   Labs Ordered: Vitamin B12, folate, and TSH  Family/Staff  Communication Plan of care discuss with resident and professional staff members. Resident and professional staff members verbalize understanding and agree with plan of  care. No additional questions or concerns reported.    Arthur Holms, MSN, AGNP-C [address] [place], [place] [address] [phone] [8am-5pm] After hours: [phone]  I have personally reviewed this note and agree with the care plan  [facility], MD  [facility] [phone] (Monday-Friday 8 am - 5 pm) [phone] (afterhours)

## 2014-03-05 LAB — CBC AND DIFFERENTIAL
HEMATOCRIT: 29 % — AB (ref 36–46)
Hemoglobin: 9.8 g/dL — AB (ref 12.0–16.0)
Neutrophils Absolute: 3 /uL
Platelets: 175 10*3/uL (ref 150–399)
WBC: 6 10*3/mL

## 2014-03-08 LAB — HEMOGLOBIN A1C: HEMOGLOBIN A1C: 5.2 % (ref 4.0–6.0)

## 2014-03-14 ENCOUNTER — Non-Acute Institutional Stay (SKILLED_NURSING_FACILITY): Payer: Medicare Other | Admitting: Internal Medicine

## 2014-03-14 DIAGNOSIS — F32A Depression, unspecified: Secondary | ICD-10-CM

## 2014-03-14 DIAGNOSIS — F329 Major depressive disorder, single episode, unspecified: Secondary | ICD-10-CM

## 2014-03-14 DIAGNOSIS — R32 Unspecified urinary incontinence: Secondary | ICD-10-CM | POA: Insufficient documentation

## 2014-03-14 DIAGNOSIS — N183 Chronic kidney disease, stage 3 unspecified: Secondary | ICD-10-CM | POA: Insufficient documentation

## 2014-03-14 DIAGNOSIS — D638 Anemia in other chronic diseases classified elsewhere: Secondary | ICD-10-CM

## 2014-03-14 DIAGNOSIS — I10 Essential (primary) hypertension: Secondary | ICD-10-CM

## 2014-03-14 DIAGNOSIS — N179 Acute kidney failure, unspecified: Secondary | ICD-10-CM | POA: Insufficient documentation

## 2014-03-14 DIAGNOSIS — M792 Neuralgia and neuritis, unspecified: Secondary | ICD-10-CM | POA: Insufficient documentation

## 2014-03-14 DIAGNOSIS — E1122 Type 2 diabetes mellitus with diabetic chronic kidney disease: Secondary | ICD-10-CM

## 2014-03-14 DIAGNOSIS — N189 Chronic kidney disease, unspecified: Secondary | ICD-10-CM

## 2014-03-14 NOTE — Progress Notes (Signed)
Patient ID: Kathryn [name], female   DOB: Mar 21, 1933, 81 y.o.   MRN: [id number]    [facility]: [facility]  Allergies reviewed- NSAIDs, celebrex, sulfa  Chief complaints- medical management of chronic issues  Code status: full code  HPI 81 y.o. female patient is seen for routine visit. She is under total care. No falls reported. She is in bed mostly. She can feed herself but is incontinent with her bowel and bladder.  She denies any concerns this visit. No new concern from staff. cbg reviewed.  She has PMH of DM2, afib, HTN, anemia, GERD.  ROS Constitutional: Negative for fever, chills, and fatigue. HENT: Negative for congestion, and sore throat.  Eyes: Negative for eye pain, eye discharge, and visual disturbance   Cardiovascular: Negative for chest pain, palpitations, and leg swelling Respiratory: Negative cough, shortness of breath, and wheezing.   Gastrointestinal: Negative for nausea and vomiting. Negative for abdominal pain, diarrhea and constipation. Had a bowel movement this am  Musculoskeletal: Negative for back pain, joint pain, and joint swelling  Neurological: Negative for dizziness and headache Skin: Negative for rash Psychiatric: Negative for depression  Past Medical History  Diagnosis Date  . Osteoporosis   . DM (diabetes mellitus)   . HTN (hypertension)     unspecified  . Atrial fibrillation   . Macular degeneration   . Osteoarthritis   . Edema   . Anemia   . Hydronephrosis     left  . UTI (lower urinary tract infection)   . Pressure ulcer of back      Medication List       This list is accurate as of: 03/14/14  5:57 PM.  Always use your most recent med list.               amLODipine 10 MG tablet  Commonly known as:  NORVASC  Take 10 mg by mouth daily.     ascorbic acid 500 MG tablet  Commonly known as:  VITAMIN C  Take 500 mg by mouth 2 (two) times daily.     Calcium Carbonate-Vitamin D 600-400 MG-UNIT per  tablet  Take 1 tablet by mouth 2 (two) times daily.     cetirizine 10 MG tablet  Commonly known as:  ZYRTEC  Take 10 mg by mouth daily.     gabapentin 100 MG capsule  Commonly known as:  NEURONTIN  Take 300 mg by mouth 2 (two) times daily. Give patient 3 tablet twice daily to equal 300 mg for neuropathy.     HYDROcodone-acetaminophen 5-325 MG per tablet  Commonly known as:  NORCO/VICODIN  Take 1 tablet by mouth 2 (two) times daily. And 1 tablet Q6H PRN     insulin glargine 100 UNIT/ML injection  Commonly known as:  LANTUS  Inject 15 Units into the skin at bedtime.     metoprolol 50 MG tablet  Commonly known as:  LOPRESSOR  Take 50 mg by mouth 2 (two) times daily. Hold for HR < 60     multivitamin with minerals Tabs tablet  Take 1 tablet by mouth every morning.     NUTRITIONAL SUPPLEMENT PO  Take 90 mLs by mouth 4 (four) times daily. Medpass Sugar Free.     oxybutynin 5 MG tablet  Commonly known as:  DITROPAN  Take 5 mg by mouth daily. For bladder spasms.     polyethylene glycol packet  Commonly known as:  MIRALAX / GLYCOLAX  Take 17 g by  mouth every morning. For constipation     PROCEL 100 Powd  Take 2 scoop by mouth 2 (two) times daily. For wound healing and increase protein levels     sennosides-docusate sodium 8.6-50 MG tablet  Commonly known as:  SENOKOT-S  Take 2 tablets by mouth at bedtime. For constipation     simethicone 80 MG chewable tablet  Commonly known as:  MYLICON  Chew 324 mg by mouth 3 (three) times daily. For gas pains.     traZODone 25 mg Tabs tablet  Commonly known as:  DESYREL  Take 25 mg by mouth at bedtime. For insomnia     venlafaxine XR 150 MG 24 hr capsule  Commonly known as:  EFFEXOR-XR  Take 225 mg by mouth daily with breakfast.     Vitamin D3 50000 UNITS Caps  Take 1 capsule by mouth every 30 (thirty) days. On the 107 th of the month       Physical exam BP 160/67 mmHg  Pulse 62  Temp(Src) 96.8 F (36 C)  Resp 16  SpO2  97%  Constitutional: elderly female in no acute distress. Conversant and pleasant.   HEENT: Normocephalic and atraumatic. PERRL. EOM intact. No icterus. Poor dentition Neck: Supple and nontender. No lymphadenopathy, no JVD  Cardiac: Normal S1, S2. RRR. Distal pulses intact. No dependent edema.  Lungs: No respiratory distress. Breath sounds clear bilaterally without rales, rhonchi, or wheezes. Abdomen: Audible bowel sounds in all quadrants. Soft, nontender, nondistended. No palpable mass.   Musculoskeletal: Normal range of motion. Contracture in right wrist, has a brace Skin: Warm and dry. No rash noted.   Neurological: Alert and oriented to person, place and time Psychiatric: Appropriate mood and affect.   Labs 03/05/14 iron panel low TIBC and high ferritin, normal M01 and folic acid, wbc 6, hb 9.8, hct 28.6, plt 175, na 138, k 4.3, bun 51, cr 1.4, alp 156, rest of lft wnl 01/15/14 urine microalbuminuria positive, , t.chol 86, hdl 30, ldl 44, tg 59, tsh 1.16 12/20/13 a1c 6  Assessment/plan  HTN bp elevated. On norvasc 10 mg daily and lopressor 50 mg bid. Add lisinopril 10 mg daily. Check bp daily for 1 week and adjust medication if needed. goal bp < 140/90.   Type 2 diabetes mellitus with renal impairment Reviewed a1c at goal in 10/15. Recheck a1c. On lantus 15 u daily, off SSI. If a1c < 6.5, reduce lantus to 10 u. Monitor cbg to avoid hypoglycemia. LDL at goal,not on statin. With her microalbuminuria, start lisinopril 10 mg daily.   CKD stage 3 Impaired renal function, monitor periodically, started on lisinopril to help with renal modelling  Anemia, chronic disease Stable. Off ferrous sulfate, d/c vitamin c. Monitor cbc  Neuropathic pain On gabapentin 300 mg bid, continue this and norco current regimen  Depression On effexor and trazodone, followed by psych services

## 2014-04-16 DIAGNOSIS — J069 Acute upper respiratory infection, unspecified: Secondary | ICD-10-CM | POA: Insufficient documentation

## 2014-05-03 ENCOUNTER — Non-Acute Institutional Stay (SKILLED_NURSING_FACILITY): Payer: Medicare Other | Admitting: Internal Medicine

## 2014-05-03 DIAGNOSIS — N189 Chronic kidney disease, unspecified: Secondary | ICD-10-CM

## 2014-05-03 DIAGNOSIS — F32A Depression, unspecified: Secondary | ICD-10-CM

## 2014-05-03 DIAGNOSIS — F329 Major depressive disorder, single episode, unspecified: Secondary | ICD-10-CM

## 2014-05-03 DIAGNOSIS — J069 Acute upper respiratory infection, unspecified: Secondary | ICD-10-CM

## 2014-05-03 DIAGNOSIS — N183 Chronic kidney disease, stage 3 (moderate): Secondary | ICD-10-CM

## 2014-05-03 DIAGNOSIS — M792 Neuralgia and neuritis, unspecified: Secondary | ICD-10-CM

## 2014-05-03 DIAGNOSIS — I10 Essential (primary) hypertension: Secondary | ICD-10-CM

## 2014-05-03 DIAGNOSIS — E1122 Type 2 diabetes mellitus with diabetic chronic kidney disease: Secondary | ICD-10-CM

## 2014-05-03 NOTE — Progress Notes (Signed)
Patient ID: Kathryn [name], female   DOB: 06/15/1933, 81 y.o.   MRN: [id number]    Kathryn [name] Room Number: 563  Place of Service: SN[facility] (31) followed by [facility]   Allergies  Allergen Reactions  . Celebrex [Celecoxib]   . Nsaids   . Sulfa Antibiotics     Chief Complaint  Patient presents with  . Medical Management of Chronic Issues    HPI:  Since last seen, Kathryn [name] has had cataract extraction of the right eye by Dr. Susa Simmonds. She has done very well in the postoperative period and got a good report from him recently. She has plans to return to his office at [facility] in order to have scheduling to get the left eye cataract removed.  Acute upper respiratory infection: She had an acute respiratory infection 2/5/6. She was prescribed Mucinex twice daily and DuoNeb's for coughing and wheezing. She has recovered completely from that.  CKD (chronic kidney disease), stage 3 (moderate): Stable  Depression: Improved on current medication  Type 2 diabetes mellitus with diabetic chronic kidney disease: Under excellent control  Essential hypertension: Stable and controlled  Neuropathic pain:: Improved by use of gabapentin    Medications: Patient's Medications  New Prescriptions   No medications on file  Previous Medications   AMLODIPINE (NORVASC) 10 MG TABLET    Take 10 mg by mouth daily.   ASCORBIC ACID (VITAMIN C) 500 MG TABLET    Take 500 mg by mouth 2 (two) times daily.   CALCIUM CARBONATE-VITAMIN D 600-400 MG-UNIT PER TABLET    Take 1 tablet by mouth 2 (two) times daily.    CETIRIZINE (ZYRTEC) 10 MG TABLET    Take 10 mg by mouth daily.   CHOLECALCIFEROL (VITAMIN D3) 50000 UNITS CAPS    Take 1 capsule by mouth every 30 (thirty) days. On the 17 th of the month   GABAPENTIN (NEURONTIN) 100 MG CAPSULE    Take 300 mg by mouth 2 (two) times daily. Give patient 3 tablet twice daily to equal 300 mg for neuropathy.     HYDROCODONE-ACETAMINOPHEN (NORCO/VICODIN) 5-325 MG PER TABLET    Take 1 tablet by mouth 2 (two) times daily. And 1 tablet Q6H PRN   INSULIN GLARGINE (LANTUS) 100 UNIT/ML INJECTION    Inject 15 Units into the skin at bedtime.    METOPROLOL (LOPRESSOR) 50 MG TABLET    Take 50 mg by mouth 2 (two) times daily. Hold for HR < 60   MULTIPLE VITAMIN (MULITIVITAMIN WITH MINERALS) TABS    Take 1 tablet by mouth every morning.    NUTRITIONAL SUPPLEMENTS (NUTRITIONAL SUPPLEMENT PO)    Take 90 mLs by mouth 4 (four) times daily. Medpass Sugar Free.   OXYBUTYNIN (DITROPAN) 5 MG TABLET    Take 5 mg by mouth daily. For bladder spasms.   POLYETHYLENE GLYCOL (MIRALAX / GLYCOLAX) PACKET    Take 17 g by mouth every morning. For constipation   PROTEIN (PROCEL 100) POWD    Take 2 scoop by mouth 2 (two) times daily. For wound healing and increase protein levels   SENNOSIDES-DOCUSATE SODIUM (SENOKOT-S) 8.6-50 MG TABLET    Take 2 tablets by mouth at bedtime. For constipation   SIMETHICONE (MYLICON) 80 MG CHEWABLE TABLET    Chew 160 mg by mouth 3 (three) times daily. For gas pains.   TRAZODONE (DESYREL) 25 MG TABS TABLET    Take 25 mg by mouth at bedtime. For  insomnia   VENLAFAXINE XR (EFFEXOR-XR) 150 MG 24 HR CAPSULE    Take 225 mg by mouth daily with breakfast.  Modified Medications   No medications on file  Discontinued Medications   No medications on file     Review of Systems  Constitutional: Negative.  Negative for fever, chills, diaphoresis, activity change, appetite change, fatigue and unexpected weight change.  HENT: Negative for congestion, ear discharge, ear pain, hearing loss, postnasal drip, rhinorrhea, sore throat, tinnitus, trouble swallowing and voice change.   Eyes: Negative for pain, redness, itching and visual disturbance.       Regarding glasses due to cataract in the left eye and recent surgery of the right eye to remove the cataract.  Respiratory: Negative for cough, choking, shortness of  breath and wheezing.   Cardiovascular: Negative for chest pain, palpitations and leg swelling.  Gastrointestinal: Negative for nausea, abdominal pain, diarrhea, constipation and abdominal distention.  Endocrine: Negative for cold intolerance, heat intolerance, polydipsia, polyphagia and polyuria.  Genitourinary: Negative for dysuria, urgency, frequency, hematuria, flank pain, vaginal discharge, difficulty urinating and pelvic pain.  Musculoskeletal: Negative for myalgias, back pain, arthralgias, gait problem, neck pain and neck stiffness.  Skin: Negative for color change, pallor and rash.  Allergic/Immunologic: Negative.   Neurological: Negative for dizziness, tremors, seizures, syncope, weakness, numbness and headaches.  Hematological: Negative for adenopathy. Does not bruise/bleed easily.  Psychiatric/Behavioral: Negative for suicidal ideas, hallucinations, behavioral problems, confusion, sleep disturbance, dysphoric mood and agitation. The patient is not nervous/anxious and is not hyperactive.     Filed Vitals:   05/03/14 1435  BP: 118/38  Pulse: 50  Temp: 97.2 F (36.2 C)  Resp: 18  Height: 5\' 9"  (1.753 m)  Weight: 238 lb 3.2 oz (108.047 kg)   Body mass index is 35.16 kg/(m^2).  Physical Exam  Constitutional: She is oriented to person, place, and time. She appears well-developed and well-nourished. No distress.  Morbidly obese  HENT:  Right Ear: External ear normal.  Left Ear: External ear normal.  Nose: Nose normal.  Mouth/Throat: Oropharynx is clear and moist. No oropharyngeal exudate.  Eyes: Conjunctivae and EOM are normal. Pupils are equal, round, and reactive to light. No scleral icterus.  Neck: No JVD present. No tracheal deviation present. No thyromegaly present.  Cardiovascular: Normal rate, regular rhythm, normal heart sounds and intact distal pulses.  Exam reveals no gallop and no friction rub.   No murmur heard. Pulmonary/Chest: Effort normal. No respiratory  distress. She has no wheezes. She has no rales. She exhibits no tenderness.  Abdominal: She exhibits no distension and no mass. There is no tenderness.  Musculoskeletal: Normal range of motion. She exhibits no edema or tenderness.  Contracture of the right wrist. Wears a brace. Multiple hammer toes  Lymphadenopathy:    She has no cervical adenopathy.  Neurological: She is alert and oriented to person, place, and time. No cranial nerve deficit. Coordination normal.  Skin: No rash noted. She is not diaphoretic. No erythema. No pallor.  Psychiatric: She has a normal mood and affect. Her behavior is normal. Judgment and thought content normal.     Labs reviewed: Nursing Home on 05/03/2014  Component Date Value Ref Range Status  . Hemoglobin 03/05/2014 9.8* 12.0 - 16.0 g/dL Final  . HCT 03/05/2014 29* 36 - 46 % Final  . Neutrophils Absolute 03/05/2014 3   Final  . Platelets 03/05/2014 175  150 - 399 K/L Final  . WBC 03/05/2014 6.0   Final  . Hgb  A1c MFr Bld 03/08/2014 5.2  4.0 - 6.0 % Final   03/27/2014 chest x-ray for cough: Cardiomegaly without congestive heart failure. No focal pneumonia.  Assessment/Plan  1. Acute upper respiratory infection Resolved  2. CKD (chronic kidney disease), stage 3 (moderate) Stable  3. Depression Stable/improved on current medication  4. Type 2 diabetes mellitus with diabetic chronic kidney disease Controlled  5. Essential hypertension Controlled  6. Neuropathic pain Pains are tolerable with use of gabapentin and Norco.

## 2014-06-04 LAB — HEMOGLOBIN A1C: HEMOGLOBIN A1C: 5.8 % (ref 4.0–6.0)

## 2014-06-05 ENCOUNTER — Non-Acute Institutional Stay (SKILLED_NURSING_FACILITY): Payer: Medicare Other | Admitting: Internal Medicine

## 2014-06-05 DIAGNOSIS — R32 Unspecified urinary incontinence: Secondary | ICD-10-CM

## 2014-06-05 DIAGNOSIS — J309 Allergic rhinitis, unspecified: Secondary | ICD-10-CM | POA: Insufficient documentation

## 2014-06-05 DIAGNOSIS — I119 Hypertensive heart disease without heart failure: Secondary | ICD-10-CM

## 2014-06-05 DIAGNOSIS — E1142 Type 2 diabetes mellitus with diabetic polyneuropathy: Secondary | ICD-10-CM | POA: Insufficient documentation

## 2014-06-05 DIAGNOSIS — E46 Unspecified protein-calorie malnutrition: Secondary | ICD-10-CM | POA: Diagnosis not present

## 2014-06-05 DIAGNOSIS — L8993 Pressure ulcer of unspecified site, stage 3: Secondary | ICD-10-CM | POA: Diagnosis not present

## 2014-06-05 NOTE — Progress Notes (Signed)
Patient ID: Kathryn [name], female   DOB: [birth year], 81 y.o.   MRN: [id number]    [facility]: [facility]  Allergies reviewed- NSAIDs, celebrex, sulfa  Chief complaints- medical management of chronic issues  Code status: full code  HPI 81 y.o. female patient is seen for routine visit. She is under total care and in bed mostly. She can feed herself but is incontinent with her bowel and bladder. she needs assistance with ADLs. She does not like her roommate and would like her room mate to be moved. She denies any other concerns this visit. No new concern from staff.   She has PMH of DM2, afib, HTN, anemia, GERD.  ROS Constitutional: Negative for fever, chills, and fatigue. HENT: Negative for congestion, and sore throat.   Eyes: Negative for eye pain, eye discharge, and visual disturbance   Cardiovascular: Negative for chest pain, palpitations, and leg swelling Respiratory: Negative cough, shortness of breath, and wheezing.   Gastrointestinal: Negative for nausea and vomiting. Negative for abdominal pain Musculoskeletal: Negative for back pain, joint swelling   Neurological: Negative for dizziness and headache Skin: Negative for rash Psychiatric: Negative for depression  Past medical history reviewed    Medication List       This list is accurate as of: 06/05/14  2:41 PM.  Always use your most recent med list.               amLODipine 10 MG tablet  Commonly known as:  NORVASC  Take 10 mg by mouth daily.     Calcium Carbonate-Vitamin D 600-400 MG-UNIT per tablet  Take 1 tablet by mouth 2 (two) times daily.     cetirizine 10 MG tablet  Commonly known as:  ZYRTEC  Take 10 mg by mouth daily.     gabapentin 100 MG capsule  Commonly known as:  NEURONTIN  Take 300 mg by mouth 2 (two) times daily. Give patient 3 tablet twice daily to equal 300 mg for neuropathy.     HYDROcodone-acetaminophen 5-325 MG per tablet  Commonly known as:   NORCO/VICODIN  Take 1 tablet by mouth 2 (two) times daily. And 1 tablet Q6H PRN     insulin glargine 100 UNIT/ML injection  Commonly known as:  LANTUS  Inject 15 Units into the skin at bedtime.     lisinopril 10 MG tablet  Commonly known as:  PRINIVIL,ZESTRIL  Take 10 mg by mouth daily.     metoprolol 50 MG tablet  Commonly known as:  LOPRESSOR  Take 50 mg by mouth 2 (two) times daily. Hold for HR < 60     multivitamin with minerals Tabs tablet  Take 1 tablet by mouth every morning.     NUTRITIONAL SUPPLEMENT PO  Take 90 mLs by mouth 4 (four) times daily. Medpass Sugar Free.     oxybutynin 5 MG tablet  Commonly known as:  DITROPAN  Take 5 mg by mouth daily. For bladder spasms.     polyethylene glycol packet  Commonly known as:  MIRALAX / GLYCOLAX  Take 17 g by mouth every morning. For constipation     simethicone 80 MG chewable tablet  Commonly known as:  MYLICON  Chew 151 mg by mouth 3 (three) times daily. For gas pains.     traZODone 25 mg Tabs tablet  Commonly known as:  DESYREL  Take 25 mg by mouth at bedtime. For insomnia     venlafaxine XR 150 MG 24 hr  capsule  Commonly known as:  EFFEXOR-XR  Take 225 mg by mouth daily with breakfast.     Vitamin D3 50000 UNITS Caps  Take 1 capsule by mouth every 30 (thirty) days. On the 35 th of the month        Physical exam BP 150/59 mmHg  Pulse 76  Temp(Src) 98 F (36.7 C)  Resp 16  Constitutional: elderly obese female in no acute distress. Conversant and pleasant.   HEENT: Normocephalic and atraumatic. PERRL. EOM intact. No icterus. Poor dentition Neck: Supple and nontender. No lymphadenopathy, no JVD   Cardiac: Normal S1, S2. RRR. Distal pulses intact but diminished. 1+ dependent edema.   Lungs: No respiratory distress. Breath sounds clear bilaterally without rales, rhonchi, or wheezes. Abdomen: Audible bowel sounds in all quadrants. Soft, nontender, nondistended. No palpable mass.   Musculoskeletal: able to  move all 4 extremities, strength 4/5 in UE and 3/5 in LE Skin: Warm and dry. No rash noted.    Neurological: Alert and oriented to person, place and time Psychiatric: Appropriate mood and affect.   Labs 03/12/14 a1c 5.3 03/05/14 iron panel low TIBC and high ferritin, normal I50 and folic acid, wbc 6, hb 9.8, hct 28.6, plt 175, na 138, k 4.3, bun 51, cr 1.4, alp 156, rest of lft wnl 01/15/14 urine microalbuminuria positive, , t.chol 86, hdl 30, ldl 44, tg 59, tsh 1.16 12/20/13 a1c 6  Assessment/plan  Diabetes mellitus with neuropathy a1c reviewed 03/12/14 5.3. cbg 86-115 fasting and in the afternoon/ evening 146-300s. Decrease lantus to 10 u for now. Recheck a1c next lab and adjust dosing as needed. Monitor cbg to avoid hypoglycemia. LDL at goal,not on statin. Has microalbuminuria, is on ACEI. Continue gabapentin 300 mg bid. rehcekc a1c in 5/16.  Hypertensive heart disease without heart failure bp remains elevated on review. On norvasc 10 mg daily, lopressor 50 mg bid and lisinopril 10 mg daily. Change lisinopril to 20 mg daily. heck bp daily for 1 week and adjust medication if needed. goal bp < 140/90.   Stage 3 sacral pressure ulcer Continue decubivite daily to assist with wound healing. Continue her protein supplement. Continue pressure ulcer care with bactroban ointment and moistened powdered collagen, to cover with bordered foam, pressure ulcer prophylaxis  Urinary incontinence Continue ditropan 5 mg daily  Allergic rhinitis Stable, change zyrtec to 10 mg daily prn for now and reassess  Protein calorie malnutrition Continue mighty shake, eldertonic, MVI and monitor clinically

## 2014-06-24 ENCOUNTER — Other Ambulatory Visit: Payer: Self-pay | Admitting: *Deleted

## 2014-06-24 MED ORDER — HYDROCODONE-ACETAMINOPHEN 5-325 MG PO TABS: ORAL_TABLET | ORAL | Status: DC

## 2014-06-24 NOTE — Telephone Encounter (Signed)
[facility] 

## 2014-06-25 ENCOUNTER — Non-Acute Institutional Stay (SKILLED_NURSING_FACILITY): Payer: Medicare Other | Admitting: Internal Medicine

## 2014-06-25 DIAGNOSIS — M150 Primary generalized (osteo)arthritis: Secondary | ICD-10-CM | POA: Diagnosis not present

## 2014-06-25 DIAGNOSIS — E46 Unspecified protein-calorie malnutrition: Secondary | ICD-10-CM

## 2014-06-25 DIAGNOSIS — J309 Allergic rhinitis, unspecified: Secondary | ICD-10-CM

## 2014-06-25 DIAGNOSIS — T402X5A Adverse effect of other opioids, initial encounter: Secondary | ICD-10-CM | POA: Insufficient documentation

## 2014-06-25 DIAGNOSIS — E1142 Type 2 diabetes mellitus with diabetic polyneuropathy: Secondary | ICD-10-CM

## 2014-06-25 DIAGNOSIS — K5909 Other constipation: Secondary | ICD-10-CM | POA: Diagnosis not present

## 2014-06-25 DIAGNOSIS — K5903 Drug induced constipation: Secondary | ICD-10-CM

## 2014-06-25 NOTE — Progress Notes (Signed)
Patient ID: Kathryn [name], female   DOB: [birth year], 81 y.o.   MRN: [id number]    [facility]: [facility]  Allergies reviewed- NSAIDs, celebrex, sulfa  Chief complaints- medical management of chronic issues  Code status: full code  HPI 81 y.o. female patient is seen for routine visit. She is not happy today. Her breakfast was not as she would desire this morning and she dislikes her roommate. When asked about her blood sugar, she mentions that she does not eat outside food and only eats her tray. She is under total care and in bed mostly. She can feed herself but is incontinent with her bowel and bladder. she needs assistance with ADLs. No new concern from staff.  has gained 5 lbs in 2 months on chart review. She has PMH of DM2, afib, HTN, anemia, GERD.  ROS Constitutional: Negative for fever, chills, and fatigue. HENT: Negative for congestion, and sore throat.   Eyes: Negative for eye pain, eye discharge, and visual disturbance   Cardiovascular: Negative for chest pain, palpitations, and leg swelling Respiratory: Negative cough, shortness of breath, and wheezing.   Gastrointestinal: Negative for nausea and vomiting. Negative for abdominal pain Musculoskeletal: Negative for back pain, joint swelling but has arthritis pain Neurological: Negative for dizziness and headache Skin: Negative for rash Psychiatric: Negative for depression  Past medical history reviewed    Medication List       This list is accurate as of: 06/25/14  2:08 PM.  Always use your most recent med list.               amLODipine 10 MG tablet  Commonly known as:  NORVASC  Take 10 mg by mouth daily.     Calcium Carbonate-Vitamin D 600-400 MG-UNIT per tablet  Take 1 tablet by mouth 2 (two) times daily.     cetirizine 10 MG tablet  Commonly known as:  ZYRTEC  Take 10 mg by mouth daily.     ELDERTONIC PO  Take by mouth. Administer 2 teaspoonful =10 ml by mouth daily for  appetite stimulation     DECUBI-VITE Caps  Take by mouth. Take 1 capsule by mouth every day for wound healing     beta carotene w/minerals tablet  Take 1 tablet by mouth 2 (two) times daily. Take 1 tablet by mouth twice daily     gabapentin 300 MG capsule  Commonly known as:  NEURONTIN  Take 300 mg by mouth 2 (two) times daily. Take 1 capsule by mouth twice daily for neuropathy     HYDROcodone-acetaminophen 5-325 MG per tablet  Commonly known as:  NORCO/VICODIN  Take one tablet by mouth twice daily for pain. Do not exceed 4gm of Tylenol in 24 hours     insulin glargine 100 UNIT/ML injection  Commonly known as:  LANTUS  Inject into the skin. Administer 10 ml SQ gd     insulin lispro 100 UNIT/ML cartridge  Commonly known as:  HUMALOG  Inject into the skin. Give 5 units SQ TID with meals if cbg is 7200     ipratropium-albuterol 0.5-2.5 (3) MG/3ML Soln  Commonly known as:  DUONEB  Take 3 mLs by nebulization. Administer 3 ml via nebulizer four times a day as needed for cough and wheezing     lisinopril 20 MG tablet  Commonly known as:  PRINIVIL,ZESTRIL  Take by mouth daily. Take 1 tablet by mouth daily for HTN     Menthol (Topical Analgesic) 4 %  Gel  Apply topically. Apply to right shoulder twice a day for pain     metoprolol 50 MG tablet  Commonly known as:  LOPRESSOR  Take 50 mg by mouth 2 (two) times daily. Hold for HR < 60     NUTRITIONAL SUPPLEMENT PO  Take 90 mLs by mouth 4 (four) times daily. Medpass Sugar Free.     oxybutynin 5 MG tablet  Commonly known as:  DITROPAN  Take 5 mg by mouth daily. For bladder spasms.     polyethylene glycol packet  Commonly known as:  MIRALAX / GLYCOLAX  Take 17 g by mouth every morning. For constipation     simethicone 80 MG chewable tablet  Commonly known as:  MYLICON  Chew 185 mg by mouth 3 (three) times daily. For gas pains.     traZODone 25 mg Tabs tablet  Commonly known as:  DESYREL  Take 25 mg by mouth at bedtime. For  insomnia     venlafaxine XR 150 MG 24 hr capsule  Commonly known as:  EFFEXOR-XR  Take 225 mg by mouth daily with breakfast.     Vitamin D3 50000 UNITS Caps  Take 1 capsule by mouth every 30 (thirty) days. On the 82 th of the month        Physical exam BP 152/75 mmHg  Pulse 73  Temp(Src) 98.5 F (36.9 C)  Resp 16  Ht 5\' 9"  (1.753 m)  Wt 243 lb (110.224 kg)  BMI 35.87 kg/m2  SpO2 97%  Wt Readings from Last 3 Encounters:  06/25/14 243 lb (110.224 kg)  05/03/14 238 lb 3.2 oz (108.047 kg)  01/14/14 234 lb 9.6 oz (106.414 kg)   Constitutional: elderly obese female in no acute distress. Conversant and pleasant.  HEENT: Normocephalic and atraumatic. PERRL. EOM intact. No icterus. Poor dentition Neck: Supple and nontender. No lymphadenopathy, no JVD   Cardiac: Normal S1, S2. RRR. Distal pulses intact but diminished. 1+ dependent edema.   Lungs: No respiratory distress. Breath sounds clear bilaterally without rales, rhonchi, or wheezes. Abdomen: Audible bowel sounds in all quadrants. Soft, nontender, nondistended. No palpable mass.   Musculoskeletal: able to move all 4 extremities, strength 4/5 in UE and 3/5 in LE Skin: Warm and dry. No rash noted.    Neurological: Alert and oriented to person, place and time Psychiatric: Appropriate mood and affect.   Labs 06/04/14 a1c 5.8, albumin 3.2, prealbumin 21 03/12/14 a1c 5.3 03/05/14 iron panel low TIBC and high ferritin, normal U31 and folic acid, wbc 6, hb 9.8, hct 28.6, plt 175, na 138, k 4.3, bun 51, cr 1.4, alp 156, rest of lft wnl 01/15/14 urine microalbuminuria positive, , t.chol 86, hdl 30, ldl 44, tg 59, tsh 1.16 12/20/13 a1c 6  Assessment/plan  Protein calorie malnutrition Reviewed prealbumin level. On lower side of normal. Continue mighty shake, eldertonic, MVI and monitor clinically. Has gained 5 lbs. Monitor weight weekly. Continue routine skin assessment for breakdown/ ulcer  OA Continue calcium-vitamin d with norco  5-325 bid. Continue biofreeze gel  Opioid induced constipation On scheduled norco. Continue miralax daily for now  Diabetes mellitus with neuropathy a1c reviewed 5.8. Continue lantus 10 u with SSI humalog for cbg > 200 with meals. LDL at goal,not on statin. Has microalbuminuria, is on ACEI. Continue gabapentin 300 mg bid. Monitor cbg   Allergic rhinitis Continue zyrtec 10 mg daily  Blanchie Serve, MD  [facility] [phone] (Monday-Friday 8 am - 5 pm) [phone] (afterhours)

## 2014-07-10 ENCOUNTER — Non-Acute Institutional Stay (SKILLED_NURSING_FACILITY): Payer: Medicare Other | Admitting: Internal Medicine

## 2014-07-10 DIAGNOSIS — L89153 Pressure ulcer of sacral region, stage 3: Secondary | ICD-10-CM | POA: Diagnosis not present

## 2014-07-10 DIAGNOSIS — G629 Polyneuropathy, unspecified: Secondary | ICD-10-CM

## 2014-07-10 DIAGNOSIS — E1142 Type 2 diabetes mellitus with diabetic polyneuropathy: Secondary | ICD-10-CM

## 2014-07-10 DIAGNOSIS — L98429 Non-pressure chronic ulcer of back with unspecified severity: Secondary | ICD-10-CM

## 2014-07-10 DIAGNOSIS — E785 Hyperlipidemia, unspecified: Secondary | ICD-10-CM | POA: Diagnosis not present

## 2014-07-10 NOTE — Progress Notes (Signed)
Patient ID: Kathryn [name], female   DOB: [birth year], 81 y.o.   MRN: [id number]    [facility]: [facility]  Allergies reviewed- NSAIDs, celebrex, sulfa  Chief complaints- medical management of chronic issues  Code status: full code  HPI 81 y.o. female patient is seen for routine visit. She has been sleeping good at night. She had some nausea yesterday but feels fine today. She is in a better mood this visit. She is under total care and in bed mostly.  She has PMH of DM2, afib, HTN, anemia, GERD. She is non compliant with her diet. Reviewed cbg 135 this am. cbg 140-340. She is getting wound care for stage 3 pressure ulcer in her sacral area.  ROS Constitutional: Negative for fever, chills, and fatigue. HENT: Negative for congestion, and sore throat.   Eyes: Negative for eye pain, eye discharge, and visual disturbance   Cardiovascular: Negative for chest pain, palpitations, and leg swelling Respiratory: Negative cough, shortness of breath, and wheezing.   Gastrointestinal: Negative for nausea and vomiting. Negative for abdominal pain Musculoskeletal: Negative for back pain, joint swelling but has arthritis pain Neurological: Negative for dizziness and headache Skin: Negative for rash Psychiatric: Negative for depression  Past medical history reviewed    Medication List       This list is accurate as of: 07/10/14 11:59 PM.  Always use your most recent med list.               amLODipine 10 MG tablet  Commonly known as:  NORVASC  Take 10 mg by mouth daily.     Calcium Carbonate-Vitamin D 600-400 MG-UNIT per tablet  Take 1 tablet by mouth 2 (two) times daily.     cetirizine 10 MG tablet  Commonly known as:  ZYRTEC  Take 10 mg by mouth daily.     ELDERTONIC PO  Take by mouth. Administer 2 teaspoonful =10 ml by mouth daily for appetite stimulation     DECUBI-VITE Caps  Take by mouth. Take 1 capsule by mouth every day for wound healing       beta carotene w/minerals tablet  Take 1 tablet by mouth 2 (two) times daily. Take 1 tablet by mouth twice daily     gabapentin 300 MG capsule  Commonly known as:  NEURONTIN  Take 300 mg by mouth 2 (two) times daily. Take 1 capsule by mouth twice daily for neuropathy     HYDROcodone-acetaminophen 5-325 MG per tablet  Commonly known as:  NORCO/VICODIN  Take one tablet by mouth twice daily for pain. Do not exceed 4gm of Tylenol in 24 hours     insulin glargine 100 UNIT/ML injection  Commonly known as:  LANTUS  Inject into the skin. Administer 15 ml SQ gd     insulin lispro 100 UNIT/ML cartridge  Commonly known as:  HUMALOG  Inject into the skin. Check blood sugar three times daily with meals if CBG> 200 inject 5 units subcutaneously     ipratropium-albuterol 0.5-2.5 (3) MG/3ML Soln  Commonly known as:  DUONEB  Take 3 mLs by nebulization. Administer 3 ml via nebulizer four times a day as needed for cough and wheezing     lisinopril 20 MG tablet  Commonly known as:  PRINIVIL,ZESTRIL  Take by mouth daily. Take 1 tablet by mouth daily for HTN     Menthol (Topical Analgesic) 4 % Gel  Apply topically. Apply to right shoulder twice a day for pain  metoprolol 50 MG tablet  Commonly known as:  LOPRESSOR  Take 50 mg by mouth 2 (two) times daily. Hold for HR < 60     NUTRITIONAL SUPPLEMENT PO  Take 90 mLs by mouth 4 (four) times daily. Medpass Sugar Free.     oxybutynin 5 MG tablet  Commonly known as:  DITROPAN  Take 5 mg by mouth daily. For bladder spasms.     polyethylene glycol packet  Commonly known as:  MIRALAX / GLYCOLAX  Take 17 g by mouth every morning. For constipation     simethicone 80 MG chewable tablet  Commonly known as:  MYLICON  Chew 333 mg by mouth 3 (three) times daily. For gas pains.     traZODone 25 mg Tabs tablet  Commonly known as:  DESYREL  Take 25 mg by mouth at bedtime. For insomnia     venlafaxine XR 150 MG 24 hr capsule  Commonly known as:   EFFEXOR-XR  Take 225 mg by mouth daily with breakfast.     Vitamin D3 50000 UNITS Caps  Take 1 capsule by mouth every 30 (thirty) days. On the 40 th of the month        Physical exam BP 123/62 mmHg  Pulse 79  Temp(Src) 97.7 F (36.5 C)  Resp 16  Wt 243 lb 14.4 oz (110.632 kg)  SpO2 96%  Constitutional: elderly obese female in no acute distress. Conversant and pleasant.  HEENT: Normocephalic and atraumatic. PERRL. EOM intact. No icterus. Poor dentition Neck: Supple and nontender. No lymphadenopathy, no JVD   Cardiac: Normal S1, S2. RRR. Distal pulses intact but diminished. 1+ dependent edema.   Lungs: No respiratory distress. Breath sounds clear bilaterally without rales, rhonchi, or wheezes. Abdomen: Audible bowel sounds in all quadrants. Soft, nontender, nondistended. No palpable mass.   Musculoskeletal: able to move all 4 extremities, strength 4/5 in UE and 3/5 in LE Skin: Warm and dry. No rash noted. stage 3 sacral ulcer, heel folaters Neurological: Alert and oriented to person, place and time Psychiatric: Appropriate mood and affect.   Labs 06/04/14 a1c 5.8, albumin 3.2, prealbumin 21 03/12/14 a1c 5.3 03/05/14 iron panel low TIBC and high ferritin, normal L45 and folic acid, wbc 6, hb 9.8, hct 28.6, plt 175, na 138, k 4.3, bun 51, cr 1.4, alp 156, rest of lft wnl 01/15/14 urine microalbuminuria positive, , t.chol 86, hdl 30, ldl 44, tg 59, tsh 1.16 12/20/13 a1c 6  Assessment/plan  Sacral pressure ulcer Stage 3, continue wound care, pressure ulcer prophylaxis and protein supplement  Dm type 2 with neuropathy a1c 06/04/14 5.8. Currently on lantus 10 u with SSI humalog for cbg > 200 with meals. LDL at goal,not on statin. Has microalbuminuria, is on ACEI. Continue gabapentin 300 mg bid. Monitor cbg and increase lantus to 15 u for now. a1c due in June.  Hyperlipidemia Lipid panel from 11/15 reviewed. Recheck lipid panel next lab, goal ldl < [place],  MD  [place] (Monday-Friday 8 am - 5 pm) [phone] (afterhours)

## 2014-07-11 LAB — LIPID PANEL
Cholesterol: 112 mg/dL (ref 0–200)
HDL: 27 mg/dL — AB (ref 35–70)
LDL Cholesterol: 48 mg/dL
TRIGLYCERIDES: 187 mg/dL — AB (ref 40–160)

## 2014-08-14 LAB — CBC AND DIFFERENTIAL
HEMATOCRIT: 29 % — AB (ref 36–46)
Hemoglobin: 10 g/dL — AB (ref 12.0–16.0)
Platelets: 194 10*3/uL (ref 150–399)
WBC: 4.8 10^3/mL

## 2014-08-14 LAB — BASIC METABOLIC PANEL
BUN: 43 mg/dL — AB (ref 4–21)
Creatinine: 1.4 mg/dL — AB (ref 0.5–1.1)
GLUCOSE: 73 mg/dL
POTASSIUM: 4.4 mmol/L (ref 3.4–5.3)
SODIUM: 139 mmol/L (ref 137–147)

## 2014-08-14 LAB — HEPATIC FUNCTION PANEL
ALK PHOS: 175 U/L — AB (ref 25–125)
ALT: 25 U/L (ref 7–35)
AST: 29 U/L (ref 13–35)
BILIRUBIN, TOTAL: 0.3 mg/dL

## 2014-08-21 ENCOUNTER — Non-Acute Institutional Stay (SKILLED_NURSING_FACILITY): Payer: Medicare Other | Admitting: Internal Medicine

## 2014-08-21 ENCOUNTER — Encounter: Payer: Self-pay | Admitting: Internal Medicine

## 2014-08-21 DIAGNOSIS — I509 Heart failure, unspecified: Secondary | ICD-10-CM

## 2014-08-21 DIAGNOSIS — J069 Acute upper respiratory infection, unspecified: Secondary | ICD-10-CM

## 2014-08-21 DIAGNOSIS — I110 Hypertensive heart disease with heart failure: Secondary | ICD-10-CM

## 2014-08-21 DIAGNOSIS — L8993 Pressure ulcer of unspecified site, stage 3: Secondary | ICD-10-CM

## 2014-08-21 DIAGNOSIS — E1142 Type 2 diabetes mellitus with diabetic polyneuropathy: Secondary | ICD-10-CM

## 2014-08-21 DIAGNOSIS — R5381 Other malaise: Secondary | ICD-10-CM

## 2014-08-21 NOTE — Progress Notes (Signed)
Patient ID: Kathryn [name], female   DOB: 1933/06/12, 81 y.o.   MRN: [id number]    [place]  Code Status: Full Code   Chief Complaint  Patient presents with  . Medical Management of Chronic Issues    Routine Visit    Allergies  Allergen Reactions  . Celebrex [Celecoxib]   . Nsaids   . Sulfa Antibiotics     HPI 81 y.o. female patient is seen for routine visit. She has been having cough with white phlegm and some chest congestion for few days. Denies any fever or chills. Breathing is stable. Her grand daughter is getting married this Saturday and she would like to go attend the wedding. She is in her bed all the time and refuses to get out of bed. When asked the staff about patient sitting on a wheelchair, she has not done this for few years now. She denies any other concerns. Had breakfast this am. She is under total care, can feed herself but is incontinent with her bowel and bladder. she needs assistance with ADLs. Has been gaining weight. She has PMH of DM2, afib, HTN, anemia, GERD.  ROS Constitutional: Negative for fever, chills, and fatigue. HENT: Negative for sore throat.   Eyes: Negative for eye pain, eye discharge and visual disturbance   Cardiovascular: Negative for chest pain, palpitations and leg swelling Respiratory: Negative cough, shortness of breath and wheezing.   Gastrointestinal: Negative for nausea, vomiting and abdominal pain Musculoskeletal: Negative for back pain, joint swelling but has arthritis pain Neurological: Negative for dizziness and headache Skin: Negative for rash Psychiatric: Negative for depression  Past medical history reviewed    Medication List       This list is accurate as of: 08/21/14 11:09 AM.  Always use your most recent med list.               AMBULATORY NON FORMULARY MEDICATION  Medication Name: Med Pass 240 mL with water three times daily for hydration     amLODipine 10 MG tablet  Commonly known as:   NORVASC  Take 10 mg by mouth daily.     benzonatate 100 MG capsule  Commonly known as:  TESSALON  Take 100 mg by mouth 3 (three) times daily as needed for cough (x 7 days starting 08/20/14).     Calcium Carbonate-Vitamin D 600-400 MG-UNIT per tablet  Take 1 tablet by mouth 2 (two) times daily.     cetirizine 10 MG tablet  Commonly known as:  ZYRTEC  Take 10 mg by mouth daily.     DECUBI-VITE Caps  Take by mouth. Take 1 capsule by mouth every day for wound healing     beta carotene w/minerals tablet  Take 1 tablet by mouth 2 (two) times daily. Take 1 tablet by mouth twice daily     gabapentin 300 MG capsule  Commonly known as:  NEURONTIN  Take 300 mg by mouth 2 (two) times daily. Take 1 capsule by mouth twice daily for neuropathy     HYDROcodone-acetaminophen 5-325 MG per tablet  Commonly known as:  NORCO/VICODIN  Take one tablet by mouth twice daily for pain. Do not exceed 4gm of Tylenol in 24 hours     insulin glargine 100 UNIT/ML injection  Commonly known as:  LANTUS  Inject into the skin. Administer 15 ml SQ gd     insulin lispro 100 UNIT/ML cartridge  Commonly known as:  HUMALOG  Inject into the skin. Check  blood sugar three times daily with meals if CBG> 200 inject 5 units subcutaneously     ipratropium-albuterol 0.5-2.5 (3) MG/3ML Soln  Commonly known as:  DUONEB  Take 3 mLs by nebulization. Administer 3 ml via nebulizer four times a day as needed for cough and wheezing     lisinopril 20 MG tablet  Commonly known as:  PRINIVIL,ZESTRIL  Take by mouth daily. Take 1 tablet by mouth daily for HTN     Menthol (Topical Analgesic) 4 % Gel  Apply topically. Apply to right shoulder twice a day for pain     metoprolol 50 MG tablet  Commonly known as:  LOPRESSOR  Take 50 mg by mouth 2 (two) times daily. Hold for HR < 60     oxybutynin 5 MG tablet  Commonly known as:  DITROPAN  Take 5 mg by mouth daily. For bladder spasms.     polyethylene glycol packet  Commonly  known as:  MIRALAX / GLYCOLAX  Take 17 g by mouth every morning. For constipation     PROCEL Powd  Take by mouth. 1 scoop in 6-8 oz of liquid and take by mouth twice daily for nutritional support and increase protein     simethicone 80 MG chewable tablet  Commonly known as:  MYLICON  Chew 580 mg by mouth 3 (three) times daily. For gas pains.     traZODone 50 MG tablet  Commonly known as:  DESYREL  Take 25 mg by mouth at bedtime.     venlafaxine XR 150 MG 24 hr capsule  Commonly known as:  EFFEXOR-XR  Take 225 mg by mouth daily with breakfast.     Vitamin D3 2000 UNITS Tabs  Take by mouth daily.        Physical exam BP 129/68 mmHg  Pulse 62  Temp(Src) 97.8 F (36.6 C) (Oral)  Resp 18  Ht 5\' 9"  (1.753 m)  Wt 248 lb 12.8 oz (112.855 kg)  BMI 36.72 kg/m2  SpO2 98%  Wt Readings from Last 3 Encounters:  08/21/14 248 lb 12.8 oz (112.855 kg)  07/10/14 243 lb 14.4 oz (110.632 kg)  06/25/14 243 lb (110.224 kg)   Constitutional: elderly obese female in no acute distress. Conversant and pleasant.  HEENT: Normocephalic and atraumatic. PERRL. EOM intact. No icterus. Poor dentition Neck: Supple and nontender. No lymphadenopathy, no JVD   Cardiac: Normal S1, S2. RRR. Distal pulses intact but diminished. 1+ dependent edema.   Lungs: No respiratory distress. Breath sounds clear bilaterally without rales, rhonchi, or wheezes. Abdomen: Audible bowel sounds in all quadrants. Soft, nontender, nondistended. No palpable mass.   Musculoskeletal: able to move all 4 extremities, strength 4/5 in UE and 3/5 in LE Skin: Warm and dry. No rash noted. sacral wound stage 3 Neurological: Alert and oriented to person, place and time Psychiatric: Appropriate mood and affect.   Labs 08/14/14 wbc 4.8, hb 10, bun 43, cr 1.39, alp 175, alb 3, alp 175, rest of LFT WNL 07/11/14 t.chol 112, tg 187, hdl 27, ldl 48 06/04/14 a1c 5.8, albumin 3.2, prealbumin 21 03/12/14 a1c 5.3 03/05/14 iron panel low TIBC and high  ferritin, normal D98 and folic acid, wbc 6, hb 9.8, hct 28.6, plt 175, na 138, k 4.3, bun 51, cr 1.4, alp 156, rest of lft wnl 01/15/14 urine microalbuminuria positive, , t.chol 86, hdl 30, ldl 44, tg 59, tsh 1.16 12/20/13 a1c 6   Assessment/plan  Upper respiratory tract infection Has cough, no other URI symptoms. Clear  lung exam. Started on tessalon and duoneb from this am. Pending chest xray but no acute finding on lung exam. Continue conservative management for now and reassess  Sacral wound stage 3 Continue wound care with normal saline, collagen and silver hydrogel dressing, followed by wound care team.   Physical deconditioning In bed mostly and total care. Refuses to get out of bed but willing to do this to go attend her grand daughter's wedding. Will have therapy team evaluate patient for wheelchair positioning and tolerance as she will need these to be able to attend the wedding. Patient voices understanding this  HTN Stable bp reading on review. Continue norvasc 10 mg daily, lisinopril 20 mg daily, metoprolol 50 mg bid. Monitor bp reading  CHF unspecified Has LVH seen on chest xray. Last echocardiogram 2009 in records show normal EF and moderately dilated LA. Has been gaining weight but no signs of fluid overload on exam at present. Monitor clinically.  Diabetes mellitus with neuropathy a1c from 3/16 reviewed 5.8. Continue lantus 15 u with SSI humalog for cbg > 200 with meals. LDL at goal,not on statin. Has microalbuminuria, is on ACEI. Continue gabapentin 300 mg bid. Monitor cbg and recheck a1c    Blanchie Serve, MD  [facility] [phone] (Monday-Friday 8 am - 5 pm) [phone] (afterhours)

## 2014-08-23 ENCOUNTER — Other Ambulatory Visit: Payer: Self-pay | Admitting: *Deleted

## 2014-08-23 MED ORDER — HYDROCODONE-ACETAMINOPHEN 5-325 MG PO TABS: ORAL_TABLET | ORAL | Status: DC

## 2014-08-23 NOTE — Telephone Encounter (Signed)
[facility]

## 2014-09-13 ENCOUNTER — Non-Acute Institutional Stay (SKILLED_NURSING_FACILITY): Payer: Medicare Other | Admitting: Internal Medicine

## 2014-09-13 DIAGNOSIS — M810 Age-related osteoporosis without current pathological fracture: Secondary | ICD-10-CM | POA: Insufficient documentation

## 2014-09-13 DIAGNOSIS — K5900 Constipation, unspecified: Secondary | ICD-10-CM

## 2014-09-13 DIAGNOSIS — M818 Other osteoporosis without current pathological fracture: Secondary | ICD-10-CM | POA: Diagnosis not present

## 2014-09-13 DIAGNOSIS — K130 Diseases of lips: Secondary | ICD-10-CM

## 2014-09-13 DIAGNOSIS — K5909 Other constipation: Secondary | ICD-10-CM | POA: Insufficient documentation

## 2014-09-13 NOTE — Progress Notes (Signed)
Patient ID: Kathryn [name], female   DOB: 07-31-1933, 81 y.o.   MRN: [id number]       [place]  Code Status: Full Code   Chief Complaint  Patient presents with  . Medical Management of Chronic Issues   Allergies  Allergen Reactions  . Celebrex [Celecoxib]   . Nsaids   . Sulfa Antibiotics     HPI 81 y.o. female patient is seen for routine visit. She is seen in her room today. She is lying in bed. She denies any concerns. Her sacral area sore has resolved. She is under total care, can feed herself but is incontinent with her bowel and bladder. she needs assistance with ADLs.   ROS Constitutional: Negative for fever, chills HENT: Negative for sore throat, runny nose and cough.   Eyes: Negative for eye pain, eye discharge and visual disturbance   Cardiovascular: Negative for chest pain, palpitations and leg swelling Respiratory: Negative cough, shortness of breath and wheezing.   Gastrointestinal: Negative for nausea, vomiting and abdominal pain Musculoskeletal: Negative for back pain, joint swelling but has arthritis pain Neurological: Negative for dizziness and headache Skin: Negative for rash Psychiatric: Negative for depression  Past medical history reviewed    Medication List       This list is accurate as of: 09/13/14  2:52 PM.  Always use your most recent med list.               AMBULATORY NON FORMULARY MEDICATION  Medication Name: Med Pass 240 mL with water three times daily for hydration     amLODipine 10 MG tablet  Commonly known as:  NORVASC  Take 10 mg by mouth daily.     benzonatate 100 MG capsule  Commonly known as:  TESSALON  Take 100 mg by mouth 3 (three) times daily as needed for cough (x 7 days starting 08/20/14).     Calcium Carbonate-Vitamin D 600-400 MG-UNIT per tablet  Take 1 tablet by mouth 2 (two) times daily.     cetirizine 10 MG tablet  Commonly known as:  ZYRTEC  Take 10 mg by mouth daily.     DECUBI-VITE Caps    Take by mouth. Take 1 capsule by mouth every day for wound healing     beta carotene w/minerals tablet  Take 1 tablet by mouth 2 (two) times daily. Take 1 tablet by mouth twice daily     gabapentin 300 MG capsule  Commonly known as:  NEURONTIN  Take 300 mg by mouth 2 (two) times daily. Take 1 capsule by mouth twice daily for neuropathy     HYDROcodone-acetaminophen 5-325 MG per tablet  Commonly known as:  NORCO/VICODIN  Take one tablet by mouth twice daily for pain. Do not exceed 4gm of Tylenol in 24 hours     hydrOXYzine 25 MG tablet  Commonly known as:  ATARAX/VISTARIL  Take 25 mg by mouth every 8 (eight) hours as needed for itching.     insulin glargine 100 UNIT/ML injection  Commonly known as:  LANTUS  Inject into the skin. Administer 15 ml SQ gd     insulin lispro 100 UNIT/ML cartridge  Commonly known as:  HUMALOG  Inject into the skin. Check blood sugar three times daily with meals if CBG> 200 inject 5 units subcutaneously     ipratropium-albuterol 0.5-2.5 (3) MG/3ML Soln  Commonly known as:  DUONEB  Take 3 mLs by nebulization. Administer 3 ml via nebulizer four times a day  as needed for cough and wheezing     lisinopril 20 MG tablet  Commonly known as:  PRINIVIL,ZESTRIL  Take by mouth daily. Take 1 tablet by mouth daily for HTN     Menthol (Topical Analgesic) 4 % Gel  Apply topically. Apply to right shoulder twice a day for pain     metoprolol 50 MG tablet  Commonly known as:  LOPRESSOR  Take 50 mg by mouth 2 (two) times daily. Hold for HR < 60     oxybutynin 5 MG tablet  Commonly known as:  DITROPAN  Take 5 mg by mouth daily. For bladder spasms.     polyethylene glycol packet  Commonly known as:  MIRALAX / GLYCOLAX  Take 17 g by mouth every morning. For constipation     PROCEL Powd  Take by mouth. 1 scoop in 6-8 oz of liquid and take by mouth twice daily for nutritional support and increase protein     simethicone 80 MG chewable tablet  Commonly known as:   MYLICON  Chew 683 mg by mouth 3 (three) times daily. For gas pains.     traZODone 50 MG tablet  Commonly known as:  DESYREL  Take 25 mg by mouth at bedtime.     venlafaxine XR 150 MG 24 hr capsule  Commonly known as:  EFFEXOR-XR  Take 225 mg by mouth daily with breakfast.     Vitamin D3 2000 UNITS Tabs  Take by mouth daily.        Physical exam BP 140/74 mmHg  Pulse 60  Temp(Src) 98.4 F (36.9 C)  Resp 18  Ht 5\' 9"  (1.753 m)  Wt 241 lb (109.317 kg)  BMI 35.57 kg/m2  SpO2 97%  Wt Readings from Last 3 Encounters:  09/13/14 241 lb (109.317 kg)  08/21/14 248 lb 12.8 oz (112.855 kg)  07/10/14 243 lb 14.4 oz (110.632 kg)   Constitutional: elderly obese female in no acute distress. Conversant and pleasant.  HEENT: Normocephalic and atraumatic. PERRL. EOM intact. No icterus. Poor dentition Neck: Supple and nontender. No lymphadenopathy, no JVD   Cardiac: Normal S1, S2. RRR. Distal pulses intact but diminished. 1+ dependent edema.   Lungs: No respiratory distress. Breath sounds clear bilaterally without rales, rhonchi, or wheezes. Abdomen: Audible bowel sounds in all quadrants. Soft, nontender, nondistended. No palpable mass.   Musculoskeletal: able to move all 4 extremities, strength 4/5 in UE and 3/5 in LE Skin: Warm and dry. No rash noted. erythema with dryness noted on upper and lower lips, no involvement of oral mucosa Neurological: Alert and oriented to person, place and time Psychiatric: Appropriate mood and affect.   Labs 09/03/14 iron 94, tibc 215, normal b12 and folate, hb 10.6, na 140, k 4.4, bun 31, cr 1.27, alb 2.9 08/14/14 wbc 4.8, hb 10, bun 43, cr 1.39, alp 175, alb 3, alp 175, rest of LFT WNL 07/11/14 t.chol 112, tg 187, hdl 27, ldl 48 06/04/14 a1c 5.8, albumin 3.2, prealbumin 21 03/12/14 a1c 5.3 03/05/14 iron panel low TIBC and high ferritin, normal M19 and folic acid, wbc 6, hb 9.8, hct 28.6, plt 175, na 138, k 4.3, bun 51, cr 1.4, alp 156, rest of lft  wnl 01/15/14 urine microalbuminuria positive, , t.chol 86, hdl 30, ldl 44, tg 59, tsh 1.16 12/20/13 a1c 6   Assessment/plan  Chronic constipation Stable, continue miralax daily  chelitis Tried vaseline without help. Start triamcinolone 0.025% ointment to the lips bid for a week and reassess Allergic rhinitis  Continue zyrtec 10 mg daily for now  Age related osteoporosis  No recent fracture reported. Continue calcium carbonate- vitamin d supplement   Blanchie Serve, MD  [facility] [phone] (Monday-Friday 8 am - 5 pm) [phone] (afterhours)

## 2014-10-14 ENCOUNTER — Non-Acute Institutional Stay (SKILLED_NURSING_FACILITY): Payer: Medicare Other | Admitting: Internal Medicine

## 2014-10-14 DIAGNOSIS — F322 Major depressive disorder, single episode, severe without psychotic features: Secondary | ICD-10-CM

## 2014-10-14 DIAGNOSIS — I110 Hypertensive heart disease with heart failure: Secondary | ICD-10-CM | POA: Diagnosis not present

## 2014-10-14 DIAGNOSIS — I509 Heart failure, unspecified: Secondary | ICD-10-CM

## 2014-10-14 DIAGNOSIS — F329 Major depressive disorder, single episode, unspecified: Secondary | ICD-10-CM

## 2014-10-14 DIAGNOSIS — M150 Primary generalized (osteo)arthritis: Secondary | ICD-10-CM | POA: Diagnosis not present

## 2014-10-14 NOTE — Progress Notes (Signed)
Patient ID: Kathryn [name], female   DOB: 03/14/1933, 81 y.o.   MRN: [id number]      [place]  Code Status: Full Code   Chief Complaint  Patient presents with  . Medical Management of Chronic Issues   Allergies  Allergen Reactions  . Celebrex [Celecoxib]   . Nsaids   . Sulfa Antibiotics     HPI 81 y.o. female patient is seen for routine visit. She denies any concerns. She is under total care, can feed herself but is incontinent with her bowel and bladder. she needs assistance with ADLs. She is followed by psychiatry services.  ROS Constitutional: Negative for fever, chills HENT: Negative for sore throat, runny nose and cough.   Eyes: Negative for eye pain, eye discharge and visual disturbance   Cardiovascular: Negative for chest pain, palpitations and leg swelling Respiratory: Negative cough, shortness of breath and wheezing.   Gastrointestinal: Negative for nausea, vomiting and abdominal pain Musculoskeletal: Negative for back pain, joint swelling but has arthritis pain Neurological: Negative for dizziness and headache Skin: Negative for rash Psychiatric: Negative for depression  Past medical history reviewed    Medication List       This list is accurate as of: 10/14/14  3:11 PM.  Always use your most recent med list.               AMBULATORY NON FORMULARY MEDICATION  Medication Name: Med Pass 240 mL with water three times daily for hydration     amLODipine 10 MG tablet  Commonly known as:  NORVASC  Take 10 mg by mouth daily.     benzonatate 100 MG capsule  Commonly known as:  TESSALON  Take 100 mg by mouth 3 (three) times daily as needed for cough (x 7 days starting 08/20/14).     Calcium Carbonate-Vitamin D 600-400 MG-UNIT per tablet  Take 1 tablet by mouth 2 (two) times daily.     cetirizine 10 MG tablet  Commonly known as:  ZYRTEC  Take 10 mg by mouth daily.     DECUBI-VITE Caps  Take by mouth. Take 1 capsule by mouth every day  for wound healing     beta carotene w/minerals tablet  Take 1 tablet by mouth 2 (two) times daily. Take 1 tablet by mouth twice daily     gabapentin 300 MG capsule  Commonly known as:  NEURONTIN  Take 300 mg by mouth 2 (two) times daily. Take 1 capsule by mouth twice daily for neuropathy     HYDROcodone-acetaminophen 5-325 MG per tablet  Commonly known as:  NORCO/VICODIN  Take one tablet by mouth twice daily for pain. Do not exceed 4gm of Tylenol in 24 hours     hydrOXYzine 25 MG tablet  Commonly known as:  ATARAX/VISTARIL  Take 25 mg by mouth every 8 (eight) hours as needed for itching.     insulin glargine 100 UNIT/ML injection  Commonly known as:  LANTUS  Inject into the skin. Administer 15 ml SQ gd     insulin lispro 100 UNIT/ML cartridge  Commonly known as:  HUMALOG  Inject into the skin. Check blood sugar three times daily with meals if CBG> 200 inject 5 units subcutaneously     ipratropium-albuterol 0.5-2.5 (3) MG/3ML Soln  Commonly known as:  DUONEB  Take 3 mLs by nebulization. Administer 3 ml via nebulizer four times a day as needed for cough and wheezing     lisinopril 20 MG tablet  Commonly known as:  PRINIVIL,ZESTRIL  Take by mouth daily. Take 1 tablet by mouth daily for HTN     Menthol (Topical Analgesic) 4 % Gel  Apply topically. Apply to right shoulder twice a day for pain     metoprolol 50 MG tablet  Commonly known as:  LOPRESSOR  Take 50 mg by mouth 2 (two) times daily. Hold for HR < 60     oxybutynin 5 MG tablet  Commonly known as:  DITROPAN  Take 5 mg by mouth daily. For bladder spasms.     polyethylene glycol packet  Commonly known as:  MIRALAX / GLYCOLAX  Take 17 g by mouth every morning. For constipation     PROCEL Powd  Take by mouth. 1 scoop in 6-8 oz of liquid and take by mouth twice daily for nutritional support and increase protein     simethicone 80 MG chewable tablet  Commonly known as:  MYLICON  Chew 253 mg by mouth 3 (three) times  daily. For gas pains.     traZODone 50 MG tablet  Commonly known as:  DESYREL  Take 25 mg by mouth at bedtime.     venlafaxine XR 150 MG 24 hr capsule  Commonly known as:  EFFEXOR-XR  Take 225 mg by mouth daily with breakfast.     Vitamin D3 2000 UNITS Tabs  Take by mouth daily.        Physical exam BP 160/77 mmHg  Pulse 70  Temp(Src) 97.2 F (36.2 C)  Resp 18  Ht 5\' 9"  (1.753 m)  Wt 243 lb (110.224 kg)  BMI 35.87 kg/m2  SpO2 97%  Wt Readings from Last 3 Encounters:  10/14/14 243 lb (110.224 kg)  09/13/14 241 lb (109.317 kg)  08/21/14 248 lb 12.8 oz (112.855 kg)   Constitutional: elderly obese female in no acute distress. Conversant and pleasant.  HEENT: Normocephalic and atraumatic. PERRL. EOM intact. No icterus. Poor dentition Neck: Supple and nontender. No lymphadenopathy, no JVD   Cardiac: Normal S1, S2. RRR. Distal pulses intact but diminished. 1+ dependent edema.   Lungs: No respiratory distress. Breath sounds clear bilaterally without rales, rhonchi, or wheezes. Abdomen: Audible bowel sounds in all quadrants. Soft, nontender, nondistended. No palpable mass.   Musculoskeletal: able to move all 4 extremities, strength 4/5 in UE and 3/5 in LE Skin: Warm and dry. No rash noted.  Neurological: Alert and oriented  Psychiatric: Appropriate mood and affect.   Labs 09/03/14 iron 94, tibc 215, normal b12 and folate, hb 10.6, na 140, k 4.4, bun 31, cr 1.27, alb 2.9 08/14/14 wbc 4.8, hb 10, bun 43, cr 1.39, alp 175, alb 3, alp 175, rest of LFT WNL 07/11/14 t.chol 112, tg 187, hdl 27, ldl 48 06/04/14 a1c 5.8, albumin 3.2, prealbumin 21 03/12/14 a1c 5.3 03/05/14 iron panel low TIBC and high ferritin, normal G64 and folic acid, wbc 6, hb 9.8, hct 28.6, plt 175, na 138, k 4.3, bun 51, cr 1.4, alp 156, rest of lft wnl 01/15/14 urine microalbuminuria positive, , t.chol 86, hdl 30, ldl 44, tg 59, tsh 1.16 12/20/13 a1c 6   Assessment/plan  Primary generalized OA Continue vitamin d  supplement with biofreeze and prn norco for pain and monitor  major depression Lacks motivation, stable, followed by psych services. Continue current regimen of venlafaxine  HTN Elevated bp with SBP > 150 on average. Currently on norvasc 10 mg daily, metoprolol 50 mg bid with lisinopril 20 mg daily. Increase lisinopril to 40 mg  daily and monitor bp daily for 2 weeks and reassess for bp medication adjsutment. Check bmp in 4 weeks   Blanchie Serve, MD  [facility] [phone] (Monday-Friday 8 am - 5 pm) [phone] (afterhours)

## 2014-10-21 ENCOUNTER — Other Ambulatory Visit: Payer: Self-pay | Admitting: *Deleted

## 2014-10-21 MED ORDER — HYDROCODONE-ACETAMINOPHEN 5-325 MG PO TABS: ORAL_TABLET | ORAL | Status: DC

## 2014-10-21 NOTE — Telephone Encounter (Signed)
[facility] 

## 2014-11-18 ENCOUNTER — Non-Acute Institutional Stay (SKILLED_NURSING_FACILITY): Payer: Medicare Other | Admitting: Internal Medicine

## 2014-11-18 DIAGNOSIS — E1142 Type 2 diabetes mellitus with diabetic polyneuropathy: Secondary | ICD-10-CM

## 2014-11-18 DIAGNOSIS — R400 Somnolence: Secondary | ICD-10-CM | POA: Diagnosis not present

## 2014-11-18 DIAGNOSIS — I10 Essential (primary) hypertension: Secondary | ICD-10-CM | POA: Diagnosis not present

## 2014-11-18 NOTE — Progress Notes (Signed)
Patient ID: Kathryn [name], female   DOB: 30-Jul-1933, 81 y.o.   MRN: [id number]      [place]  Code Status: Full Code   Chief Complaint  Patient presents with  . Medical Management of Chronic Issues   Allergies  Allergen Reactions  . Celebrex [Celecoxib]   . Nsaids   . Sulfa Antibiotics     HPI 81 y.o. female patient is seen for routine visit. She denies any concerns. She is under total care, can feed herself but is incontinent with her bowel and bladder. she needs assistance with ADLs. She is followed by psychiatry services. She has been having excessive daytime sleepiness recently per staff. Patient falling asleep in between conversation and unable to participate in ROS. Has lost some weight since last visit.  ROS Unable to obtain   Past medical history reviewed    Medication List       This list is accurate as of: 11/18/14  5:49 PM.  Always use your most recent med list.               AMBULATORY NON FORMULARY MEDICATION  Medication Name: Med Pass 240 mL with water three times daily for hydration     amLODipine 10 MG tablet  Commonly known as:  NORVASC  Take 10 mg by mouth daily.     benzonatate 100 MG capsule  Commonly known as:  TESSALON  Take 100 mg by mouth 3 (three) times daily as needed for cough (x 7 days starting 08/20/14).     Calcium Carbonate-Vitamin D 600-400 MG-UNIT per tablet  Take 1 tablet by mouth 2 (two) times daily.     cetirizine 10 MG tablet  Commonly known as:  ZYRTEC  Take 10 mg by mouth daily.     DECUBI-VITE Caps  Take by mouth. Take 1 capsule by mouth every day for wound healing     beta carotene w/minerals tablet  Take 1 tablet by mouth 2 (two) times daily. Take 1 tablet by mouth twice daily     gabapentin 300 MG capsule  Commonly known as:  NEURONTIN  Take 300 mg by mouth 2 (two) times daily. Take 1 capsule by mouth twice daily for neuropathy     HYDROcodone-acetaminophen 5-325 MG per tablet  Commonly  known as:  NORCO/VICODIN  Take one tablet by mouth twice daily for pain. Do not exceed 4gm of Tylenol in 24 hours     hydrOXYzine 25 MG tablet  Commonly known as:  ATARAX/VISTARIL  Take 25 mg by mouth every 8 (eight) hours as needed for itching.     insulin glargine 100 UNIT/ML injection  Commonly known as:  LANTUS  Inject into the skin. Administer 15 ml SQ gd     insulin lispro 100 UNIT/ML cartridge  Commonly known as:  HUMALOG  Inject into the skin. Check blood sugar three times daily with meals if CBG> 200 inject 5 units subcutaneously     ipratropium-albuterol 0.5-2.5 (3) MG/3ML Soln  Commonly known as:  DUONEB  Take 3 mLs by nebulization. Administer 3 ml via nebulizer four times a day as needed for cough and wheezing     lisinopril 20 MG tablet  Commonly known as:  PRINIVIL,ZESTRIL  Take 40 mg by mouth daily. Take 1 tablet by mouth daily for HTN     Menthol (Topical Analgesic) 4 % Gel  Apply topically. Apply to right shoulder twice a day for pain     metoprolol  50 MG tablet  Commonly known as:  LOPRESSOR  Take 50 mg by mouth 2 (two) times daily. Hold for HR < 60     oxybutynin 5 MG tablet  Commonly known as:  DITROPAN  Take 5 mg by mouth daily. For bladder spasms.     polyethylene glycol packet  Commonly known as:  MIRALAX / GLYCOLAX  Take 17 g by mouth every morning. For constipation     PROCEL Powd  Take by mouth. 1 scoop in 6-8 oz of liquid and take by mouth twice daily for nutritional support and increase protein     simethicone 80 MG chewable tablet  Commonly known as:  MYLICON  Chew 563 mg by mouth 3 (three) times daily. For gas pains.     traZODone 50 MG tablet  Commonly known as:  DESYREL  Take 25 mg by mouth at bedtime.     venlafaxine XR 150 MG 24 hr capsule  Commonly known as:  EFFEXOR-XR  Take 225 mg by mouth daily with breakfast.     Vitamin D3 2000 UNITS Tabs  Take by mouth daily.        Physical exam BP 140/80 mmHg  Pulse 76  Temp(Src)  97.4 F (36.3 C)  Resp 18  Ht 5\' 9"  (1.753 m)  Wt 236 lb 14.4 oz (107.457 kg)  BMI 34.97 kg/m2  SpO2 97%  Wt Readings from Last 3 Encounters:  11/18/14 236 lb 14.4 oz (107.457 kg)  10/14/14 243 lb (110.224 kg)  09/13/14 241 lb (109.317 kg)   Constitutional: elderly obese female in no acute distress. somnolent HEENT: Normocephalic and atraumatic. PERRL. EOM intact. No icterus. Poor dentition Neck: Supple and nontender. No lymphadenopathy, no JVD   Cardiac: Normal S1, S2. RRR. Distal pulses intact but diminished. 1+ dependent edema.   Lungs: No respiratory distress. Breath sounds clear bilaterally without rales, rhonchi, or wheezes. Abdomen: Audible bowel sounds in all quadrants. Soft, nontender, nondistended. No palpable mass.   Musculoskeletal: able to move all 4 extremities, strength 4/5 in UE and 3/5 in LE Skin: Warm and dry. No rash noted.  Psychiatric: unable to assess   Labs 09/03/14 iron 94, tibc 215, normal b12 and folate, hb 10.6, na 140, k 4.4, bun 31, cr 1.27, alb 2.9 08/14/14 wbc 4.8, hb 10, bun 43, cr 1.39, alp 175, alb 3, alp 175, rest of LFT WNL 07/11/14 t.chol 112, tg 187, hdl 27, ldl 48 06/04/14 a1c 5.8, albumin 3.2, prealbumin 21 03/12/14 a1c 5.3 03/05/14 iron panel low TIBC and high ferritin, normal O75 and folic acid, wbc 6, hb 9.8, hct 28.6, plt 175, na 138, k 4.3, bun 51, cr 1.4, alp 156, rest of lft wnl 01/15/14 urine microalbuminuria positive, , t.chol 86, hdl 30, ldl 44, tg 59, tsh 1.16 12/20/13 a1c 6   Assessment/plan  HTN Improved bp reading. Continue lisinopril 40 mg daily with metoprolol 50 mg bid and norvasc 10 mg daily for now. Monitor vital signs  Somnolence Check cbc with diff and cmp to rule out infection and metabolic disorder. Change neurontin to 300 mg qhs for now and d/c trazodone and reassess. Send u/a with c/s  Dm Monitor cbg, check a1c, continue lantus 15 u daily with SSI humalog. Continue neurontin for neuropathic pain but decrease dosing as  above and reassess    Blanchie Serve, MD  [facility] [phone] (Monday-Friday 8 am - 5 pm) [phone] (afterhours)

## 2014-11-20 ENCOUNTER — Non-Acute Institutional Stay (SKILLED_NURSING_FACILITY): Payer: Medicare Other | Admitting: Internal Medicine

## 2014-11-20 ENCOUNTER — Other Ambulatory Visit: Payer: Self-pay | Admitting: Internal Medicine

## 2014-11-20 DIAGNOSIS — D72829 Elevated white blood cell count, unspecified: Secondary | ICD-10-CM

## 2014-11-20 DIAGNOSIS — N189 Chronic kidney disease, unspecified: Secondary | ICD-10-CM

## 2014-11-20 DIAGNOSIS — R7989 Other specified abnormal findings of blood chemistry: Secondary | ICD-10-CM

## 2014-11-20 DIAGNOSIS — R109 Unspecified abdominal pain: Secondary | ICD-10-CM

## 2014-11-20 DIAGNOSIS — R748 Abnormal levels of other serum enzymes: Secondary | ICD-10-CM

## 2014-11-20 DIAGNOSIS — R945 Abnormal results of liver function studies: Principal | ICD-10-CM

## 2014-11-20 DIAGNOSIS — R159 Full incontinence of feces: Secondary | ICD-10-CM

## 2014-11-20 DIAGNOSIS — K5900 Constipation, unspecified: Secondary | ICD-10-CM

## 2014-11-20 NOTE — Progress Notes (Signed)
Patient ID: Kathryn [name], female   DOB: [birth year], 81 y.o.   MRN: [id number]      [place]  Code Status: Full Code   Chief Complaint  Patient presents with  . Acute Visit    deranged LFT   Allergies  Allergen Reactions  . Celebrex [Celecoxib]   . Nsaids   . Sulfa Antibiotics     HPI 81 y.o. female patient is seen for acute visit. Her lab results show deranged LFTs with elevated ALP of 886. On review of labs she also has elevated white blood cell with left shift. She is seen in her room today. She is more awake and alert compared to 2 days back. She denies any concerns. She has hx of cholecystectomy. She is under total care.  ROS Denies fever or chills Denies nausea or vomiting Denies abdominal pain  Denies chest pain or new dyspnea Denies headache or dizziness Denies pruritis   Past medical history reviewed  Past Surgical History  Procedure Laterality Date  . Replacement total knee  08/2007    bilateral  . Appendectomy    . Cholecystectomy    . Colonoscopy    . Upper gastrointestinal endoscopy    . Cystoscopy w/ ureteral stent placement  04/29/2011    Procedure: CYSTOSCOPY WITH RETROGRADE PYELOGRAM/URETERAL STENT PLACEMENT;  Surgeon: Bernestine Amass, MD;  Location: [facility];  Service: Urology;  Laterality: Bilateral;  bilateral retrograde, double j stent. Cystogram   . Ureteroscopy  04/29/2011    Procedure: URETEROSCOPY;  Surgeon: Bernestine Amass, MD;  Location: [facility];  Service: Urology;  Laterality: Right;  . Cystoscopy with ureteroscopy  05/31/2011    Procedure: CYSTOSCOPY WITH URETEROSCOPY;  Surgeon: Bernestine Amass, MD;  Location: [facility];  Service: Urology;  Laterality: Right;  Ureteral biopsy Right         Medication List       This list is accurate as of: 11/20/14  1:57 PM.  Always use your most recent med list.               AMBULATORY NON FORMULARY MEDICATION  Medication Name: Med Pass 240 mL with water three times daily for  hydration     amLODipine 10 MG tablet  Commonly known as:  NORVASC  Take 10 mg by mouth daily.     benzonatate 100 MG capsule  Commonly known as:  TESSALON  Take 100 mg by mouth 3 (three) times daily as needed for cough (x 7 days starting 08/20/14).     Calcium Carbonate-Vitamin D 600-400 MG-UNIT per tablet  Take 1 tablet by mouth 2 (two) times daily.     cetirizine 10 MG tablet  Commonly known as:  ZYRTEC  Take 10 mg by mouth daily.     DECUBI-VITE Caps  Take by mouth. Take 1 capsule by mouth every day for wound healing     beta carotene w/minerals tablet  Take 1 tablet by mouth 2 (two) times daily. Take 1 tablet by mouth twice daily     gabapentin 300 MG capsule  Commonly known as:  NEURONTIN  Take 300 mg by mouth at bedtime. Take 1 capsule by mouth twice daily for neuropathy     HYDROcodone-acetaminophen 5-325 MG per tablet  Commonly known as:  NORCO/VICODIN  Take one tablet by mouth twice daily for pain. Do not exceed 4gm of Tylenol in 24 hours     hydrOXYzine 25 MG tablet  Commonly known as:  ATARAX/VISTARIL  Take 25 mg by mouth every 8 (eight) hours as needed for itching.     insulin glargine 100 UNIT/ML injection  Commonly known as:  LANTUS  Inject into the skin. Administer 15 ml SQ gd     insulin lispro 100 UNIT/ML cartridge  Commonly known as:  HUMALOG  Inject into the skin. Check blood sugar three times daily with meals if CBG> 200 inject 5 units subcutaneously     ipratropium-albuterol 0.5-2.5 (3) MG/3ML Soln  Commonly known as:  DUONEB  Take 3 mLs by nebulization. Administer 3 ml via nebulizer four times a day as needed for cough and wheezing     lisinopril 20 MG tablet  Commonly known as:  PRINIVIL,ZESTRIL  Take 40 mg by mouth daily. Take 1 tablet by mouth daily for HTN     Menthol (Topical Analgesic) 4 % Gel  Apply topically. Apply to right shoulder twice a day for pain     metoprolol 50 MG tablet  Commonly known as:  LOPRESSOR  Take 50 mg by mouth  2 (two) times daily. Hold for HR < 60     oxybutynin 5 MG tablet  Commonly known as:  DITROPAN  Take 5 mg by mouth daily. For bladder spasms.     polyethylene glycol packet  Commonly known as:  MIRALAX / GLYCOLAX  Take 17 g by mouth every morning. For constipation     PROCEL Powd  Take by mouth. 1 scoop in 6-8 oz of liquid and take by mouth twice daily for nutritional support and increase protein     simethicone 80 MG chewable tablet  Commonly known as:  MYLICON  Chew 518 mg by mouth 3 (three) times daily. For gas pains.     venlafaxine XR 150 MG 24 hr capsule  Commonly known as:  EFFEXOR-XR  Take 225 mg by mouth daily with breakfast.     Vitamin D3 2000 UNITS Tabs  Take by mouth daily.        Physical exam BP 142/80 mmHg  Pulse 76  Temp(Src) 97.4 F (36.3 C)  Resp 18  SpO2 97%  Wt Readings from Last 3 Encounters:  11/18/14 236 lb 14.4 oz (107.457 kg)  10/14/14 243 lb (110.224 kg)  09/13/14 241 lb (109.317 kg)   Constitutional: elderly obese female in no acute distress HEENT: Normocephalic and atraumatic. PERRL. EOM intact. No icterus or pallor. Poor dentition Neck: Supple and nontender. No lymphadenopathy, no JVD   Cardiac: Normal S1, S2. RRR. Distal pulses intact but diminished. 1+ dependent edema.   Lungs: No respiratory distress. Breath sounds clear bilaterally without rales, rhonchi, or wheezes. Abdomen: Audible bowel sounds in all quadrants. Soft, nontender, nondistended but obese. No palpable mass.   Musculoskeletal: able to move all 4 extremities, strength 4/5 in UE and 3/5 in LE Skin: Warm and dry. No petechiae or purpura  Psychiatric: unable to assess   Labs 11/19/14 na 138, k 4.7, glu 109, bun 35, cr 1.49, alb 2.72, t.bil 3.59, alp 886, ast 152, alt 138, gfr 35.83,a1c 5.7, wbc 12.2, hb 10.6, mcv 103.5, plt 173, neutrophil 84 09/03/14 iron 94, tibc 215, normal b12 and folate, hb 10.6, na 140, k 4.4, bun 31, cr 1.27, alb 2.9 08/14/14 wbc 4.8, hb 10, bun 43,  cr 1.39, alp 175, alb 3, alp 175, rest of LFT WNL 07/11/14 t.chol 112, tg 187, hdl 27, ldl 48 06/04/14 a1c 5.8, albumin 3.2, prealbumin 21 03/12/14 a1c 5.3 03/05/14 iron panel low TIBC  and high ferritin, normal K16 and folic acid, wbc 6, hb 9.8, hct 28.6, plt 175, na 138, k 4.3, bun 51, cr 1.4, alp 156, rest of lft wnl 01/15/14 urine microalbuminuria positive, , t.chol 86, hdl 30, ldl 44, tg 59, tsh 1.16 12/20/13 a1c 6   Assessment/plan  Deranged LFT Acute worsening of liver function. No specific clinical finding. Hepatitis panel in 6/16 was negative. S/p cholecytectomy. Currently on effexor and hydrocodone-apap which could cause and worsen liver toxicity. AST and ALT are 3 times above the upper limit of normal but ALP is mainly elevated giving more of cholestatic pattern. normal platelet function. Concern for cholestasis and infiltrative liver pathology. Check lft, amylase, lipase, LDH and coagulation profile with GGT to assess further. Will also get CT abdomen with contrast to assess for vascular flow, biliary flow and hepatic obstruction. D/c hydrocodone apap and effexor for now until further lab review  Leukocytosis With left shift. Send u/a with c/s to assess for urinary infection and get chest xray to assess for infiltrate. She is afebrile and is alert and oriented today. Will hold off on initiating antibiotic until ct abdomen is resulted   Blanchie Serve, MD  [facility] [phone] (Monday-Friday 8 am - 5 pm) [phone] (afterhours)

## 2014-11-27 ENCOUNTER — Encounter (HOSPITAL_COMMUNITY): Payer: Self-pay

## 2014-11-27 ENCOUNTER — Ambulatory Visit (HOSPITAL_COMMUNITY)
Admission: RE | Admit: 2014-11-27 | Discharge: 2014-11-27 | Disposition: A | Discharge status: Home / Self Care | Payer: Medicare Other | Source: Ambulatory Visit | Attending: Internal Medicine | Admitting: Internal Medicine

## 2014-11-27 DIAGNOSIS — I701 Atherosclerosis of renal artery: Secondary | ICD-10-CM | POA: Diagnosis not present

## 2014-11-27 DIAGNOSIS — I700 Atherosclerosis of aorta: Secondary | ICD-10-CM | POA: Diagnosis not present

## 2014-11-27 DIAGNOSIS — I2510 Atherosclerotic heart disease of native coronary artery without angina pectoris: Secondary | ICD-10-CM | POA: Insufficient documentation

## 2014-11-27 DIAGNOSIS — K5900 Constipation, unspecified: Secondary | ICD-10-CM | POA: Diagnosis not present

## 2014-11-27 DIAGNOSIS — Z9049 Acquired absence of other specified parts of digestive tract: Secondary | ICD-10-CM | POA: Insufficient documentation

## 2014-11-27 DIAGNOSIS — K449 Diaphragmatic hernia without obstruction or gangrene: Secondary | ICD-10-CM | POA: Insufficient documentation

## 2014-11-27 DIAGNOSIS — R7989 Other specified abnormal findings of blood chemistry: Secondary | ICD-10-CM | POA: Insufficient documentation

## 2014-11-27 DIAGNOSIS — M810 Age-related osteoporosis without current pathological fracture: Secondary | ICD-10-CM | POA: Insufficient documentation

## 2014-11-27 MED ORDER — IOHEXOL 300 MG/ML  SOLN
100.0000 mL | Freq: Once | INTRAMUSCULAR | Status: AC | PRN
  Administered 2014-11-27: 80 mL via INTRAVENOUS

## 2014-11-29 ENCOUNTER — Encounter (HOSPITAL_COMMUNITY): Payer: Self-pay | Admitting: *Deleted

## 2014-11-29 ENCOUNTER — Emergency Department (HOSPITAL_COMMUNITY)
Admission: EM | Admit: 2014-11-29 | Discharge: 2014-11-30 | Disposition: A | Discharge status: Home / Self Care | Payer: Medicare Other | Attending: Emergency Medicine | Admitting: Emergency Medicine

## 2014-11-29 ENCOUNTER — Emergency Department (HOSPITAL_COMMUNITY): Payer: Medicare Other

## 2014-11-29 DIAGNOSIS — M810 Age-related osteoporosis without current pathological fracture: Secondary | ICD-10-CM | POA: Insufficient documentation

## 2014-11-29 DIAGNOSIS — Z794 Long term (current) use of insulin: Secondary | ICD-10-CM | POA: Insufficient documentation

## 2014-11-29 DIAGNOSIS — M1990 Unspecified osteoarthritis, unspecified site: Secondary | ICD-10-CM | POA: Diagnosis not present

## 2014-11-29 DIAGNOSIS — F329 Major depressive disorder, single episode, unspecified: Secondary | ICD-10-CM | POA: Diagnosis not present

## 2014-11-29 DIAGNOSIS — R4182 Altered mental status, unspecified: Secondary | ICD-10-CM | POA: Diagnosis present

## 2014-11-29 DIAGNOSIS — E7220 Disorder of urea cycle metabolism, unspecified: Secondary | ICD-10-CM | POA: Diagnosis not present

## 2014-11-29 DIAGNOSIS — E119 Type 2 diabetes mellitus without complications: Secondary | ICD-10-CM | POA: Diagnosis not present

## 2014-11-29 DIAGNOSIS — Z872 Personal history of diseases of the skin and subcutaneous tissue: Secondary | ICD-10-CM | POA: Insufficient documentation

## 2014-11-29 DIAGNOSIS — Z87440 Personal history of urinary (tract) infections: Secondary | ICD-10-CM | POA: Insufficient documentation

## 2014-11-29 DIAGNOSIS — I10 Essential (primary) hypertension: Secondary | ICD-10-CM | POA: Insufficient documentation

## 2014-11-29 DIAGNOSIS — G4700 Insomnia, unspecified: Secondary | ICD-10-CM | POA: Insufficient documentation

## 2014-11-29 DIAGNOSIS — Z79899 Other long term (current) drug therapy: Secondary | ICD-10-CM | POA: Insufficient documentation

## 2014-11-29 DIAGNOSIS — N390 Urinary tract infection, site not specified: Secondary | ICD-10-CM | POA: Diagnosis not present

## 2014-11-29 DIAGNOSIS — I509 Heart failure, unspecified: Secondary | ICD-10-CM | POA: Insufficient documentation

## 2014-11-29 DIAGNOSIS — Z862 Personal history of diseases of the blood and blood-forming organs and certain disorders involving the immune mechanism: Secondary | ICD-10-CM | POA: Insufficient documentation

## 2014-11-29 DIAGNOSIS — I4891 Unspecified atrial fibrillation: Secondary | ICD-10-CM | POA: Insufficient documentation

## 2014-11-29 DIAGNOSIS — K219 Gastro-esophageal reflux disease without esophagitis: Secondary | ICD-10-CM | POA: Diagnosis not present

## 2014-11-29 HISTORY — DX: Major depressive disorder, single episode, unspecified: F32.9

## 2014-11-29 HISTORY — DX: Insomnia, unspecified: G47.00

## 2014-11-29 HISTORY — DX: Depression, unspecified: F32.A

## 2014-11-29 HISTORY — DX: Gastro-esophageal reflux disease without esophagitis: K21.9

## 2014-11-29 LAB — CBC WITH DIFFERENTIAL/PLATELET
BASOS ABS: 0.1 10*3/uL (ref 0.0–0.1)
Basophils Relative: 1 %
EOS ABS: 0.5 10*3/uL (ref 0.0–0.7)
EOS PCT: 7 %
HCT: 37 % (ref 36.0–46.0)
Hemoglobin: 12.6 g/dL (ref 12.0–15.0)
LYMPHS ABS: 1.3 10*3/uL (ref 0.7–4.0)
Lymphocytes Relative: 19 %
MCH: 34.4 pg — AB (ref 26.0–34.0)
MCHC: 34.1 g/dL (ref 30.0–36.0)
MCV: 101.1 fL — ABNORMAL HIGH (ref 78.0–100.0)
Monocytes Absolute: 0.6 10*3/uL (ref 0.1–1.0)
Monocytes Relative: 9 %
Neutro Abs: 4.3 10*3/uL (ref 1.7–7.7)
Neutrophils Relative %: 64 %
PLATELETS: 329 10*3/uL (ref 150–400)
RBC: 3.66 MIL/uL — AB (ref 3.87–5.11)
RDW: 12.4 % (ref 11.5–15.5)
WBC: 6.8 10*3/uL (ref 4.0–10.5)

## 2014-11-29 LAB — COMPREHENSIVE METABOLIC PANEL
ALK PHOS: 872 U/L — AB (ref 38–126)
ALT: 70 U/L — AB (ref 14–54)
AST: 93 U/L — ABNORMAL HIGH (ref 15–41)
Albumin: 3.2 g/dL — ABNORMAL LOW (ref 3.5–5.0)
Anion gap: 11 (ref 5–15)
BUN: 31 mg/dL — ABNORMAL HIGH (ref 6–20)
CALCIUM: 10.6 mg/dL — AB (ref 8.9–10.3)
CO2: 26 mmol/L (ref 22–32)
CREATININE: 1.46 mg/dL — AB (ref 0.44–1.00)
Chloride: 103 mmol/L (ref 101–111)
GFR, EST AFRICAN AMERICAN: 38 mL/min — AB (ref >60)
GFR, EST NON AFRICAN AMERICAN: 33 mL/min — AB (ref >60)
Glucose, Bld: 158 mg/dL — ABNORMAL HIGH (ref 65–99)
Potassium: 4.1 mmol/L (ref 3.5–5.1)
Sodium: 140 mmol/L (ref 135–145)
Total Bilirubin: 1.1 mg/dL (ref 0.3–1.2)
Total Protein: 7.2 g/dL (ref 6.5–8.1)

## 2014-11-29 LAB — PROTIME-INR
INR: 1.17 (ref 0.00–1.49)
PROTHROMBIN TIME: 15.1 s (ref 11.6–15.2)

## 2014-11-29 LAB — AMMONIA: AMMONIA: 74 umol/L — AB (ref 9–35)

## 2014-11-29 LAB — ACETAMINOPHEN LEVEL

## 2014-11-29 LAB — ETHANOL: Alcohol, Ethyl (B): <5 mg/dL (ref <5)

## 2014-11-29 NOTE — ED Notes (Signed)
In and out cath performed on the pt, pt had only a small amount of cloudy, yellow urine with sediment. Dr. Alvino Chapel notified of the same. Sent to lab for testing, not enough per lab tech

## 2014-11-29 NOTE — ED Notes (Signed)
Pt arrives from [facility] with c/o ams, elevated ammonia and LFT labs via PTAR.

## 2014-11-29 NOTE — ED Provider Notes (Signed)
CSN: [id number]     Arrival date & time 11/29/14  1847 History   First MD Initiated Contact with Patient 11/29/14 1858     Chief Complaint  Patient presents with  . Altered Mental Status  . Abnormal Lab     (Consider location/radiation/quality/duration/timing/severity/associated sxs/prior Treatment) The history is provided by the patient.   patient was sent in from nursing home with elevated liver enzymes. Has been monitored for the last couple weeks. Patient apparently had some somnolence but she is awake and appropriate upon arrival here. States she had some vomiting once she got into the ambulance but otherwise she feels fine. Reportedly had ammonias of 240 a couple days ago. Has just started lactulose. LFTs are elevated had a CT scan that showed some mild but stable biliary dilatation. Patient denies abdominal pain. Denies confusion.  Past Medical History  Diagnosis Date  . Osteoporosis   . HTN (hypertension)     unspecified  . Atrial fibrillation   . Macular degeneration   . Osteoarthritis   . Edema   . Anemia   . Hydronephrosis     left  . UTI (lower urinary tract infection)   . Pressure ulcer of back   . DM (diabetes mellitus)     lantus  . Depression   . Insomnia   . CHF (congestive heart failure)   . GERD (gastroesophageal reflux disease)    Past Surgical History  Procedure Laterality Date  . Replacement total knee  08/2007    bilateral  . Appendectomy    . Cholecystectomy    . Colonoscopy    . Upper gastrointestinal endoscopy    . Cystoscopy w/ ureteral stent placement  04/29/2011    Procedure: CYSTOSCOPY WITH RETROGRADE PYELOGRAM/URETERAL STENT PLACEMENT;  Surgeon: Bernestine Amass, MD;  Location: [facility];  Service: Urology;  Laterality: Bilateral;  bilateral retrograde, double j stent. Cystogram   . Ureteroscopy  04/29/2011    Procedure: URETEROSCOPY;  Surgeon: Bernestine Amass, MD;  Location: [facility];  Service: Urology;  Laterality: Right;  . Cystoscopy with  ureteroscopy  05/31/2011    Procedure: CYSTOSCOPY WITH URETEROSCOPY;  Surgeon: Bernestine Amass, MD;  Location: [facility];  Service: Urology;  Laterality: Right;  Ureteral biopsy Right     Family History  Problem Relation Age of Onset  . Diabetes Father   . Atrial fibrillation Father   . Breast cancer Paternal Grandmother   . Heart disease Mother    Social History  Substance Use Topics  . Smoking status: Never Smoker   . Smokeless tobacco: Never Used  . Alcohol Use: No   OB History    No data available     Review of Systems  Constitutional: Negative for activity change and appetite change.  Eyes: Negative for pain.  Respiratory: Negative for chest tightness and shortness of breath.   Cardiovascular: Negative for chest pain and leg swelling.  Gastrointestinal: Positive for nausea. Negative for vomiting, abdominal pain and diarrhea.  Genitourinary: Negative for flank pain.  Musculoskeletal: Negative for back pain and neck stiffness.  Skin: Negative for rash.  Neurological: Negative for weakness, numbness and headaches.  Psychiatric/Behavioral: Negative for behavioral problems.      Allergies  Celebrex; Nsaids; and Sulfa antibiotics  Home Medications   Prior to Admission medications   Medication Sig Start Date End Date Taking? Authorizing Provider  AMBULATORY NON FORMULARY MEDICATION Medication Name: Med Pass 240 mL with water three times daily for hydration    Historical Provider,  MD  amLODipine (NORVASC) 10 MG tablet Take 10 mg by mouth daily.    Historical Provider, MD  benzonatate (TESSALON) 100 MG capsule Take 100 mg by mouth 3 (three) times daily as needed for cough (x 7 days starting 08/20/14).    Historical Provider, MD  beta carotene w/minerals (OCUVITE) tablet Take 1 tablet by mouth 2 (two) times daily. Take 1 tablet by mouth twice daily    Historical Provider, MD  Calcium Carbonate-Vitamin D 600-400 MG-UNIT per tablet Take 1 tablet by mouth 2 (two) times daily.      Historical Provider, MD  cetirizine (ZYRTEC) 10 MG tablet Take 10 mg by mouth daily.    Historical Provider, MD  Cholecalciferol (VITAMIN D3) 2000 UNITS TABS Take by mouth daily.    Historical Provider, MD  gabapentin (NEURONTIN) 300 MG capsule Take 300 mg by mouth at bedtime. Take 1 capsule by mouth twice daily for neuropathy    Historical Provider, MD  HYDROcodone-acetaminophen (NORCO/VICODIN) 5-325 MG per tablet Take one tablet by mouth twice daily for pain. Do not exceed 4gm of Tylenol in 24 hours 10/21/14   Tiffany L Reed, DO  hydrOXYzine (ATARAX/VISTARIL) 25 MG tablet Take 25 mg by mouth every 8 (eight) hours as needed for itching.    Historical Provider, MD  insulin glargine (LANTUS) 100 UNIT/ML injection Inject into the skin. Administer 15 ml SQ gd    Historical Provider, MD  insulin lispro (HUMALOG) 100 UNIT/ML cartridge Inject into the skin. Check blood sugar three times daily with meals if CBG> 200 inject 5 units subcutaneously     Historical Provider, MD  ipratropium-albuterol (DUONEB) 0.5-2.5 (3) MG/3ML SOLN Take 3 mLs by nebulization. Administer 3 ml via nebulizer four times a day as needed for cough and wheezing    Historical Provider, MD  lisinopril (PRINIVIL,ZESTRIL) 20 MG tablet Take 40 mg by mouth daily. Take 1 tablet by mouth daily for HTN    Historical Provider, MD  Menthol, Topical Analgesic, 4 % GEL Apply topically. Apply to right shoulder twice a day for pain    Historical Provider, MD  metoprolol (LOPRESSOR) 50 MG tablet Take 50 mg by mouth 2 (two) times daily. Hold for HR < 60 05/19/11   Reynold Bowen, MD  Multiple Vitamins-Minerals (DECUBI-VITE) CAPS Take by mouth. Take 1 capsule by mouth every day for wound healing    Historical Provider, MD  oxybutynin (DITROPAN) 5 MG tablet Take 5 mg by mouth daily. For bladder spasms.    Historical Provider, MD  polyethylene glycol (MIRALAX / GLYCOLAX) packet Take 17 g by mouth every morning. For constipation    Historical Provider, MD   Protein (PROCEL) POWD Take by mouth. 1 scoop in 6-8 oz of liquid and take by mouth twice daily for nutritional support and increase protein    Historical Provider, MD  simethicone (MYLICON) 80 MG chewable tablet Chew 160 mg by mouth 3 (three) times daily. For gas pains.    Historical Provider, MD  venlafaxine XR (EFFEXOR-XR) 150 MG 24 hr capsule Take 225 mg by mouth daily with breakfast.    Historical Provider, MD   BP 154/92 mmHg  Pulse 61  Temp(Src) 98.4 F (36.9 C) (Oral)  Resp 12  Ht 5\' 10"  (1.778 m)  Wt 236 lb (107.049 kg)  BMI 33.86 kg/m2  SpO2 97% Physical Exam  Constitutional: She appears well-developed.  HENT:  Head: Atraumatic.  Eyes: No scleral icterus.  Neck: Neck supple.  Cardiovascular: Normal rate.   Pulmonary/Chest: Effort  normal.  Abdominal: Soft. There is no tenderness.  Musculoskeletal: Normal range of motion.  Neurological: She is alert.  Skin: Skin is warm.  Psychiatric: She has a normal mood and affect.    ED Course  Procedures (including critical care time) Labs Review Labs Reviewed  COMPREHENSIVE METABOLIC PANEL - Abnormal; Notable for the following:    Glucose, Bld 158 (*)    BUN 31 (*)    Creatinine, Ser 1.46 (*)    Calcium 10.6 (*)    Albumin 3.2 (*)    AST 93 (*)    ALT 70 (*)    Alkaline Phosphatase 872 (*)    GFR calc non Af Amer 33 (*)    GFR calc Af Amer 38 (*)    All other components within normal limits  AMMONIA - Abnormal; Notable for the following:    Ammonia 74 (*)    All other components within normal limits  CBC WITH DIFFERENTIAL/PLATELET - Abnormal; Notable for the following:    RBC 3.66 (*)    MCV 101.1 (*)    MCH 34.4 (*)    All other components within normal limits  ACETAMINOPHEN LEVEL - Abnormal; Notable for the following:    Acetaminophen (Tylenol), Serum <10 (*)    All other components within normal limits  PROTIME-INR  ETHANOL  URINALYSIS, ROUTINE W REFLEX MICROSCOPIC (NOT AT [facility])    Imaging Review Dg Chest  2 View  11/29/2014   CLINICAL DATA:  Altered mental status and elevated ammonia and LFTs  EXAM: CHEST - 2 VIEW  COMPARISON:  05/16/2011  FINDINGS: Cardiac shadow is stable. The lungs are well aerated bilaterally. Elevation of the right hemidiaphragm is seen. No bony abnormality is noted.  IMPRESSION: No acute abnormality noted.   Electronically Signed   By: Inez Catalina M.D.   On: 11/29/2014 20:13   I have personally reviewed and evaluated these images and lab results as part of my medical decision-making.   EKG Interpretation   Date/Time:  Friday November 29 2014 19:00:31 EDT Ventricular Rate:  70 PR Interval:    QRS Duration: 82 QT Interval:  412 QTC Calculation: 445 R Axis:   17 Text Interpretation:  Atrial fibrillation Anteroseptal infarct, age  indeterminate No significant change since last tracing Confirmed by  Alvino Chapel  MD, Quincy Boy [phone]) on 11/30/2014 12:34:05 AM      MDM   Final diagnoses:  Hyperammonemia    Patient is awake and appropriate. Ammonia is elevated, but much improved from what it has been. CT scans been done. Reported negative    hepatitis panel. mild one episode of hypotension while sleeping here. Urinalysis pending. Likely discharge home with follow-up.   Davonna Belling, MD 11/30/14 [phone]

## 2014-11-29 NOTE — ED Notes (Signed)
Bed: WA06 Expected date:  Expected time:  Means of arrival:  Comments: EMS 37F Diarrhea x 3 days

## 2014-11-30 DIAGNOSIS — E7220 Disorder of urea cycle metabolism, unspecified: Secondary | ICD-10-CM | POA: Diagnosis not present

## 2014-11-30 LAB — URINE MICROSCOPIC-ADD ON

## 2014-11-30 LAB — URINALYSIS, ROUTINE W REFLEX MICROSCOPIC
Bilirubin Urine: NEGATIVE
GLUCOSE, UA: NEGATIVE mg/dL
HGB URINE DIPSTICK: NEGATIVE
KETONES UR: 15 mg/dL — AB
Nitrite: NEGATIVE
PROTEIN: 100 mg/dL — AB
Specific Gravity, Urine: 1.031 — ABNORMAL HIGH (ref 1.005–1.030)
Urobilinogen, UA: 1 mg/dL (ref 0.0–1.0)
pH: 5 (ref 5.0–8.0)

## 2014-11-30 MED ORDER — CEPHALEXIN 500 MG PO CAPS
500.0000 mg | ORAL_CAPSULE | Freq: Once | ORAL | Status: AC
  Administered 2014-11-30: 500 mg via ORAL
  Filled 2014-11-30: qty 1

## 2014-11-30 MED ORDER — CEPHALEXIN 500 MG PO CAPS: 500.0000 mg | ORAL_CAPSULE | Freq: Three times a day (TID) | ORAL | Status: DC

## 2014-11-30 NOTE — ED Notes (Signed)
Patient waiting on PTAR. 

## 2014-11-30 NOTE — Discharge Instructions (Signed)
Your liver enzymes were elevated. Your ammonia has improved. Her mental status appears fine here. You may need follow-up with a gastroenterologist also.

## 2014-12-04 ENCOUNTER — Non-Acute Institutional Stay (SKILLED_NURSING_FACILITY): Payer: Medicare Other | Admitting: Internal Medicine

## 2014-12-04 ENCOUNTER — Inpatient Hospital Stay (HOSPITAL_COMMUNITY)
Admission: EM | Admit: 2014-12-04 | Discharge: 2014-12-13 | DRG: 871 | Disposition: A | Discharge status: Skilled Nursing Facility | Payer: Medicare Other | Attending: Internal Medicine | Admitting: Internal Medicine

## 2014-12-04 ENCOUNTER — Encounter (HOSPITAL_COMMUNITY): Payer: Self-pay | Admitting: *Deleted

## 2014-12-04 ENCOUNTER — Inpatient Hospital Stay (HOSPITAL_COMMUNITY): Payer: Medicare Other

## 2014-12-04 ENCOUNTER — Emergency Department (HOSPITAL_COMMUNITY): Payer: Medicare Other

## 2014-12-04 DIAGNOSIS — M1990 Unspecified osteoarthritis, unspecified site: Secondary | ICD-10-CM | POA: Diagnosis present

## 2014-12-04 DIAGNOSIS — E872 Acidosis: Secondary | ICD-10-CM | POA: Diagnosis present

## 2014-12-04 DIAGNOSIS — E1122 Type 2 diabetes mellitus with diabetic chronic kidney disease: Secondary | ICD-10-CM | POA: Diagnosis present

## 2014-12-04 DIAGNOSIS — I10 Essential (primary) hypertension: Secondary | ICD-10-CM | POA: Diagnosis present

## 2014-12-04 DIAGNOSIS — D649 Anemia, unspecified: Secondary | ICD-10-CM

## 2014-12-04 DIAGNOSIS — I481 Persistent atrial fibrillation: Secondary | ICD-10-CM

## 2014-12-04 DIAGNOSIS — N170 Acute kidney failure with tubular necrosis: Secondary | ICD-10-CM | POA: Diagnosis present

## 2014-12-04 DIAGNOSIS — Z794 Long term (current) use of insulin: Secondary | ICD-10-CM

## 2014-12-04 DIAGNOSIS — N183 Chronic kidney disease, stage 3 unspecified: Secondary | ICD-10-CM | POA: Diagnosis present

## 2014-12-04 DIAGNOSIS — I4891 Unspecified atrial fibrillation: Secondary | ICD-10-CM | POA: Diagnosis present

## 2014-12-04 DIAGNOSIS — A047 Enterocolitis due to Clostridium difficile: Secondary | ICD-10-CM | POA: Diagnosis present

## 2014-12-04 DIAGNOSIS — Z79899 Other long term (current) drug therapy: Secondary | ICD-10-CM

## 2014-12-04 DIAGNOSIS — Y846 Urinary catheterization as the cause of abnormal reaction of the patient, or of later complication, without mention of misadventure at the time of the procedure: Secondary | ICD-10-CM | POA: Diagnosis present

## 2014-12-04 DIAGNOSIS — G9340 Encephalopathy, unspecified: Secondary | ICD-10-CM | POA: Diagnosis present

## 2014-12-04 DIAGNOSIS — E7220 Disorder of urea cycle metabolism, unspecified: Secondary | ICD-10-CM

## 2014-12-04 DIAGNOSIS — T83511A Infection and inflammatory reaction due to indwelling urethral catheter, initial encounter: Secondary | ICD-10-CM | POA: Diagnosis present

## 2014-12-04 DIAGNOSIS — Z66 Do not resuscitate: Secondary | ICD-10-CM | POA: Diagnosis present

## 2014-12-04 DIAGNOSIS — A419 Sepsis, unspecified organism: Principal | ICD-10-CM | POA: Diagnosis present

## 2014-12-04 DIAGNOSIS — R748 Abnormal levels of other serum enzymes: Secondary | ICD-10-CM

## 2014-12-04 DIAGNOSIS — F322 Major depressive disorder, single episode, severe without psychotic features: Secondary | ICD-10-CM

## 2014-12-04 DIAGNOSIS — K219 Gastro-esophageal reflux disease without esophagitis: Secondary | ICD-10-CM | POA: Diagnosis present

## 2014-12-04 DIAGNOSIS — K279 Peptic ulcer, site unspecified, unspecified as acute or chronic, without hemorrhage or perforation: Secondary | ICD-10-CM | POA: Diagnosis present

## 2014-12-04 DIAGNOSIS — B9620 Unspecified Escherichia coli [E. coli] as the cause of diseases classified elsewhere: Secondary | ICD-10-CM | POA: Diagnosis present

## 2014-12-04 DIAGNOSIS — K920 Hematemesis: Secondary | ICD-10-CM | POA: Diagnosis present

## 2014-12-04 DIAGNOSIS — G43A1 Cyclical vomiting, intractable: Secondary | ICD-10-CM

## 2014-12-04 DIAGNOSIS — R7989 Other specified abnormal findings of blood chemistry: Secondary | ICD-10-CM | POA: Diagnosis not present

## 2014-12-04 DIAGNOSIS — N390 Urinary tract infection, site not specified: Secondary | ICD-10-CM | POA: Diagnosis present

## 2014-12-04 DIAGNOSIS — K2900 Acute gastritis without bleeding: Secondary | ICD-10-CM | POA: Diagnosis present

## 2014-12-04 DIAGNOSIS — K8051 Calculus of bile duct without cholangitis or cholecystitis with obstruction: Secondary | ICD-10-CM | POA: Diagnosis not present

## 2014-12-04 DIAGNOSIS — Z886 Allergy status to analgesic agent status: Secondary | ICD-10-CM | POA: Diagnosis not present

## 2014-12-04 DIAGNOSIS — R32 Unspecified urinary incontinence: Secondary | ICD-10-CM | POA: Diagnosis not present

## 2014-12-04 DIAGNOSIS — K5900 Constipation, unspecified: Secondary | ICD-10-CM | POA: Diagnosis present

## 2014-12-04 DIAGNOSIS — E876 Hypokalemia: Secondary | ICD-10-CM | POA: Diagnosis not present

## 2014-12-04 DIAGNOSIS — K8050 Calculus of bile duct without cholangitis or cholecystitis without obstruction: Secondary | ICD-10-CM | POA: Diagnosis not present

## 2014-12-04 DIAGNOSIS — Z882 Allergy status to sulfonamides status: Secondary | ICD-10-CM

## 2014-12-04 DIAGNOSIS — R112 Nausea with vomiting, unspecified: Secondary | ICD-10-CM | POA: Diagnosis present

## 2014-12-04 DIAGNOSIS — K838 Other specified diseases of biliary tract: Secondary | ICD-10-CM

## 2014-12-04 DIAGNOSIS — Z8249 Family history of ischemic heart disease and other diseases of the circulatory system: Secondary | ICD-10-CM

## 2014-12-04 DIAGNOSIS — Z9049 Acquired absence of other specified parts of digestive tract: Secondary | ICD-10-CM | POA: Diagnosis not present

## 2014-12-04 DIAGNOSIS — L89503 Pressure ulcer of unspecified ankle, stage 3: Secondary | ICD-10-CM | POA: Diagnosis present

## 2014-12-04 DIAGNOSIS — Z79891 Long term (current) use of opiate analgesic: Secondary | ICD-10-CM | POA: Diagnosis not present

## 2014-12-04 DIAGNOSIS — N179 Acute kidney failure, unspecified: Secondary | ICD-10-CM

## 2014-12-04 DIAGNOSIS — H3530 Unspecified macular degeneration: Secondary | ICD-10-CM | POA: Diagnosis present

## 2014-12-04 DIAGNOSIS — R404 Transient alteration of awareness: Secondary | ICD-10-CM

## 2014-12-04 DIAGNOSIS — Z6836 Body mass index (BMI) 36.0-36.9, adult: Secondary | ICD-10-CM | POA: Diagnosis not present

## 2014-12-04 DIAGNOSIS — K922 Gastrointestinal hemorrhage, unspecified: Secondary | ICD-10-CM | POA: Diagnosis not present

## 2014-12-04 DIAGNOSIS — I130 Hypertensive heart and chronic kidney disease with heart failure and stage 1 through stage 4 chronic kidney disease, or unspecified chronic kidney disease: Secondary | ICD-10-CM | POA: Diagnosis present

## 2014-12-04 DIAGNOSIS — Z833 Family history of diabetes mellitus: Secondary | ICD-10-CM | POA: Diagnosis not present

## 2014-12-04 DIAGNOSIS — M810 Age-related osteoporosis without current pathological fracture: Secondary | ICD-10-CM | POA: Diagnosis present

## 2014-12-04 DIAGNOSIS — Z7401 Bed confinement status: Secondary | ICD-10-CM | POA: Diagnosis not present

## 2014-12-04 DIAGNOSIS — E1142 Type 2 diabetes mellitus with diabetic polyneuropathy: Secondary | ICD-10-CM | POA: Diagnosis present

## 2014-12-04 DIAGNOSIS — E46 Unspecified protein-calorie malnutrition: Secondary | ICD-10-CM | POA: Diagnosis present

## 2014-12-04 DIAGNOSIS — Z803 Family history of malignant neoplasm of breast: Secondary | ICD-10-CM

## 2014-12-04 DIAGNOSIS — L8993 Pressure ulcer of unspecified site, stage 3: Secondary | ICD-10-CM | POA: Diagnosis present

## 2014-12-04 DIAGNOSIS — I129 Hypertensive chronic kidney disease with stage 1 through stage 4 chronic kidney disease, or unspecified chronic kidney disease: Secondary | ICD-10-CM | POA: Diagnosis present

## 2014-12-04 DIAGNOSIS — E860 Dehydration: Secondary | ICD-10-CM | POA: Diagnosis present

## 2014-12-04 DIAGNOSIS — E1129 Type 2 diabetes mellitus with other diabetic kidney complication: Secondary | ICD-10-CM | POA: Diagnosis present

## 2014-12-04 DIAGNOSIS — R110 Nausea: Secondary | ICD-10-CM

## 2014-12-04 DIAGNOSIS — K5909 Other constipation: Secondary | ICD-10-CM | POA: Diagnosis present

## 2014-12-04 DIAGNOSIS — A4151 Sepsis due to Escherichia coli [E. coli]: Secondary | ICD-10-CM | POA: Diagnosis not present

## 2014-12-04 DIAGNOSIS — I5032 Chronic diastolic (congestive) heart failure: Secondary | ICD-10-CM

## 2014-12-04 DIAGNOSIS — R945 Abnormal results of liver function studies: Secondary | ICD-10-CM | POA: Insufficient documentation

## 2014-12-04 DIAGNOSIS — F329 Major depressive disorder, single episode, unspecified: Secondary | ICD-10-CM | POA: Diagnosis present

## 2014-12-04 DIAGNOSIS — R1115 Cyclical vomiting syndrome unrelated to migraine: Secondary | ICD-10-CM

## 2014-12-04 HISTORY — DX: Chronic kidney disease, stage 3 (moderate): N18.3

## 2014-12-04 HISTORY — DX: Chronic diastolic (congestive) heart failure: I50.32

## 2014-12-04 HISTORY — DX: Chronic kidney disease, stage 3 unspecified: N18.30

## 2014-12-04 LAB — CBC WITH DIFFERENTIAL/PLATELET
BASOS PCT: 0 %
Basophils Absolute: 0 10*3/uL (ref 0.0–0.1)
EOS ABS: 0 10*3/uL (ref 0.0–0.7)
EOS PCT: 0 %
HEMATOCRIT: 35.5 % — AB (ref 36.0–46.0)
Hemoglobin: 12 g/dL (ref 12.0–15.0)
LYMPHS PCT: 3 %
Lymphs Abs: 0.6 10*3/uL — ABNORMAL LOW (ref 0.7–4.0)
MCH: 33.8 pg (ref 26.0–34.0)
MCHC: 33.8 g/dL (ref 30.0–36.0)
MCV: 100 fL (ref 78.0–100.0)
Monocytes Absolute: 0.4 10*3/uL (ref 0.1–1.0)
Monocytes Relative: 2 %
NEUTROS PCT: 95 %
Neutro Abs: 20.2 10*3/uL — ABNORMAL HIGH (ref 1.7–7.7)
PLATELETS: 299 10*3/uL (ref 150–400)
RBC: 3.55 MIL/uL — AB (ref 3.87–5.11)
RDW: 12.9 % (ref 11.5–15.5)
WBC: 21.2 10*3/uL — AB (ref 4.0–10.5)

## 2014-12-04 LAB — CBC
HEMATOCRIT: 33.2 % — AB (ref 36.0–46.0)
HEMOGLOBIN: 11 g/dL — AB (ref 12.0–15.0)
MCH: 33.5 pg (ref 26.0–34.0)
MCHC: 33.1 g/dL (ref 30.0–36.0)
MCV: 101.2 fL — AB (ref 78.0–100.0)
Platelets: 267 10*3/uL (ref 150–400)
RBC: 3.28 MIL/uL — AB (ref 3.87–5.11)
RDW: 12.9 % (ref 11.5–15.5)
WBC: 16.5 10*3/uL — AB (ref 4.0–10.5)

## 2014-12-04 LAB — COMPREHENSIVE METABOLIC PANEL
ALBUMIN: 3.2 g/dL — AB (ref 3.5–5.0)
ALT: 48 U/L (ref 14–54)
AST: 53 U/L — AB (ref 15–41)
Alkaline Phosphatase: 665 U/L — ABNORMAL HIGH (ref 38–126)
Anion gap: 17 — ABNORMAL HIGH (ref 5–15)
BUN: 60 mg/dL — AB (ref 6–20)
CHLORIDE: 95 mmol/L — AB (ref 101–111)
CO2: 21 mmol/L — AB (ref 22–32)
CREATININE: 4.49 mg/dL — AB (ref 0.44–1.00)
Calcium: 10.7 mg/dL — ABNORMAL HIGH (ref 8.9–10.3)
GFR calc Af Amer: 10 mL/min — ABNORMAL LOW (ref >60)
GFR, EST NON AFRICAN AMERICAN: 8 mL/min — AB (ref >60)
GLUCOSE: 242 mg/dL — AB (ref 65–99)
POTASSIUM: 5 mmol/L (ref 3.5–5.1)
Sodium: 133 mmol/L — ABNORMAL LOW (ref 135–145)
Total Bilirubin: 1.4 mg/dL — ABNORMAL HIGH (ref 0.3–1.2)
Total Protein: 7 g/dL (ref 6.5–8.1)

## 2014-12-04 LAB — URINE CULTURE: Culture: >=100000

## 2014-12-04 LAB — OCCULT BLOOD GASTRIC / DUODENUM (SPECIMEN CUP)
Occult Blood, Gastric: POSITIVE — AB
PH, GASTRIC: 2

## 2014-12-04 LAB — APTT: aPTT: 20 seconds — ABNORMAL LOW (ref 24–37)

## 2014-12-04 LAB — PROTIME-INR
INR: 1.21 (ref 0.00–1.49)
Prothrombin Time: 15.5 seconds — ABNORMAL HIGH (ref 11.6–15.2)

## 2014-12-04 LAB — AMMONIA: AMMONIA: 11 umol/L (ref 9–35)

## 2014-12-04 LAB — I-STAT CG4 LACTIC ACID, ED: LACTIC ACID, VENOUS: 2.2 mmol/L — AB (ref 0.5–2.0)

## 2014-12-04 MED ORDER — DECUBI-VITE PO CAPS: 1.0000 | ORAL_CAPSULE | Freq: Every day | ORAL | Status: DC

## 2014-12-04 MED ORDER — LIDOCAINE VISCOUS 2 % MT SOLN
OROMUCOSAL | Status: AC
  Administered 2014-12-04: 21:00:00
  Filled 2014-12-04: qty 15

## 2014-12-04 MED ORDER — SODIUM CHLORIDE 0.9 % IV BOLUS (SEPSIS): 1000.0000 mL | Freq: Once | INTRAVENOUS | Status: DC

## 2014-12-04 MED ORDER — ENSURE ENLIVE PO LIQD
237.0000 mL | Freq: Two times a day (BID) | ORAL | Status: DC
  Administered 2014-12-08 (×2): 237 mL via ORAL

## 2014-12-04 MED ORDER — SODIUM CHLORIDE 0.9 % IV SOLN
80.0000 mg | Freq: Once | INTRAVENOUS | Status: AC
  Administered 2014-12-04: 80 mg via INTRAVENOUS
  Filled 2014-12-04: qty 80

## 2014-12-04 MED ORDER — MORPHINE SULFATE (PF) 2 MG/ML IV SOLN: 2.0000 mg | INTRAVENOUS | Status: DC | PRN

## 2014-12-04 MED ORDER — HYDROXYZINE HCL 25 MG PO TABS: 25.0000 mg | ORAL_TABLET | Freq: Three times a day (TID) | ORAL | Status: DC | PRN

## 2014-12-04 MED ORDER — GABAPENTIN 300 MG PO CAPS
300.0000 mg | ORAL_CAPSULE | Freq: Every day | ORAL | Status: DC
  Administered 2014-12-04 – 2014-12-10 (×7): 300 mg via ORAL
  Filled 2014-12-04 (×8): qty 1

## 2014-12-04 MED ORDER — POLYETHYLENE GLYCOL 3350 17 G PO PACK
17.0000 g | PACK | Freq: Every day | ORAL | Status: DC
  Administered 2014-12-05 – 2014-12-06 (×2): 17 g via ORAL
  Filled 2014-12-04 (×2): qty 1

## 2014-12-04 MED ORDER — ADULT MULTIVITAMIN W/MINERALS CH
1.0000 | ORAL_TABLET | Freq: Every day | ORAL | Status: DC
  Administered 2014-12-05 – 2014-12-13 (×8): 1 via ORAL
  Filled 2014-12-04 (×7): qty 1

## 2014-12-04 MED ORDER — CHOLECALCIFEROL 10 MCG (400 UNIT) PO TABS
400.0000 [IU] | ORAL_TABLET | Freq: Every day | ORAL | Status: DC
  Administered 2014-12-05 – 2014-12-13 (×8): 400 [IU] via ORAL
  Filled 2014-12-04 (×9): qty 1

## 2014-12-04 MED ORDER — BENZONATATE 100 MG PO CAPS: 100.0000 mg | ORAL_CAPSULE | Freq: Three times a day (TID) | ORAL | Status: DC | PRN

## 2014-12-04 MED ORDER — ONDANSETRON HCL 4 MG/2ML IJ SOLN
4.0000 mg | Freq: Once | INTRAMUSCULAR | Status: AC
Start: 2014-12-04 — End: 2014-12-04
  Administered 2014-12-04: 4 mg via INTRAVENOUS
  Filled 2014-12-04: qty 2

## 2014-12-04 MED ORDER — SODIUM CHLORIDE 0.9 % IV SOLN
8.0000 mg/h | INTRAVENOUS | Status: DC
  Administered 2014-12-04 – 2014-12-07 (×7): 8 mg/h via INTRAVENOUS
  Filled 2014-12-04 (×14): qty 80

## 2014-12-04 MED ORDER — METOPROLOL TARTRATE 25 MG PO TABS: 50.0000 mg | ORAL_TABLET | Freq: Two times a day (BID) | ORAL | Status: DC

## 2014-12-04 MED ORDER — SODIUM CHLORIDE 0.9 % IV BOLUS (SEPSIS)
1000.0000 mL | Freq: Once | INTRAVENOUS | Status: AC
  Administered 2014-12-04: 1000 mL via INTRAVENOUS

## 2014-12-04 MED ORDER — VANCOMYCIN HCL 10 G IV SOLR
2000.0000 mg | INTRAVENOUS | Status: AC
  Administered 2014-12-04: 2000 mg via INTRAVENOUS
  Filled 2014-12-04: qty 2000

## 2014-12-04 MED ORDER — OCUVITE PO TABS
1.0000 | ORAL_TABLET | Freq: Two times a day (BID) | ORAL | Status: DC
  Administered 2014-12-05 – 2014-12-13 (×14): 1 via ORAL
  Filled 2014-12-04 (×24): qty 1

## 2014-12-04 MED ORDER — IPRATROPIUM-ALBUTEROL 0.5-2.5 (3) MG/3ML IN SOLN: 3.0000 mL | RESPIRATORY_TRACT | Status: DC | PRN

## 2014-12-04 MED ORDER — SODIUM CHLORIDE 0.9 % IV SOLN
INTRAVENOUS | Status: DC
  Administered 2014-12-04: 23:00:00 via INTRAVENOUS

## 2014-12-04 MED ORDER — ONDANSETRON HCL 4 MG/2ML IJ SOLN: 4.0000 mg | Freq: Three times a day (TID) | INTRAMUSCULAR | Status: DC | PRN

## 2014-12-04 MED ORDER — SODIUM CHLORIDE 0.9 % IV SOLN
50.0000 ug/h | INTRAVENOUS | Status: DC
  Administered 2014-12-04: 50 ug/h via INTRAVENOUS
  Filled 2014-12-04 (×2): qty 1

## 2014-12-04 MED ORDER — CALCIUM CARBONATE-VITAMIN D 600-400 MG-UNIT PO TABS: 1.0000 | ORAL_TABLET | Freq: Two times a day (BID) | ORAL | Status: DC

## 2014-12-04 MED ORDER — OCTREOTIDE LOAD VIA INFUSION
50.0000 ug | Freq: Once | INTRAVENOUS | Status: AC
  Administered 2014-12-04: 50 ug via INTRAVENOUS
  Filled 2014-12-04: qty 25

## 2014-12-04 MED ORDER — CALCIUM CARBONATE-VITAMIN D 500-200 MG-UNIT PO TABS
1.0000 | ORAL_TABLET | Freq: Two times a day (BID) | ORAL | Status: DC
  Administered 2014-12-05 – 2014-12-13 (×12): 1 via ORAL
  Filled 2014-12-04 (×16): qty 1

## 2014-12-04 MED ORDER — VITAMIN D3 50 MCG (2000 UT) PO TABS: 1.0000 | ORAL_TABLET | Freq: Every day | ORAL | Status: DC

## 2014-12-04 MED ORDER — PANTOPRAZOLE SODIUM 40 MG IV SOLR
40.0000 mg | Freq: Once | INTRAVENOUS | Status: DC
  Administered 2014-12-04: 40 mg via INTRAVENOUS
  Filled 2014-12-04: qty 40

## 2014-12-04 MED ORDER — SODIUM CHLORIDE 0.9 % IV SOLN
500.0000 mg | Freq: Three times a day (TID) | INTRAVENOUS | Status: DC
Start: 2014-12-04 — End: 2014-12-04

## 2014-12-04 MED ORDER — SODIUM CHLORIDE 0.9 % IJ SOLN
3.0000 mL | Freq: Two times a day (BID) | INTRAMUSCULAR | Status: DC
  Administered 2014-12-04 – 2014-12-13 (×13): 3 mL via INTRAVENOUS

## 2014-12-04 MED ORDER — OXYCODONE HCL 5 MG PO TABS: 10.0000 mg | ORAL_TABLET | Freq: Every day | ORAL | Status: DC

## 2014-12-04 MED ORDER — SODIUM CHLORIDE 0.9 % IV SOLN
250.0000 mg | Freq: Two times a day (BID) | INTRAVENOUS | Status: DC
  Administered 2014-12-05 – 2014-12-09 (×9): 250 mg via INTRAVENOUS
  Filled 2014-12-04 (×9): qty 250

## 2014-12-04 MED ORDER — OXYCODONE HCL 10 MG PO TABS: 10.0000 mg | ORAL_TABLET | Freq: Every day | ORAL | Status: DC

## 2014-12-04 MED ORDER — SODIUM CHLORIDE 0.9 % IV SOLN
250.0000 mg | INTRAVENOUS | Status: AC
  Administered 2014-12-04: 250 mg via INTRAVENOUS
  Filled 2014-12-04: qty 250

## 2014-12-04 NOTE — Progress Notes (Signed)
ANTIBIOTIC CONSULT NOTE - INITIAL  Pharmacy Consult for vancomycin Indication: UTI, sepsis  Allergies  Allergen Reactions  . Celebrex [Celecoxib]   . Nsaids   . Sulfa Antibiotics     Patient Measurements: weight 107 kg, height 70 inches    Vital Signs: Temp: 98.6 F (37 C) (09/28 1702) Temp Source: Oral (09/28 1702) BP: 102/68 mmHg (09/28 1945) Pulse Rate: 107 (09/28 1945) Intake/Output from previous day:   Intake/Output from this shift:    Labs:  Recent Labs  12/04/14 1804  WBC 21.2*  HGB 12.0  PLT 299  CREATININE 4.49*   Estimated Creatinine Clearance: 13.5 mL/min (by C-G formula based on Cr of 4.49). No results for input(s): VANCOTROUGH, VANCOPEAK, VANCORANDOM, GENTTROUGH, GENTPEAK, GENTRANDOM, TOBRATROUGH, TOBRAPEAK, TOBRARND, AMIKACINPEAK, AMIKACINTROU, AMIKACIN in the last 72 hours.   Microbiology: Recent Results (from the past 720 hour(s))  Urine culture     Status: None   Collection Time: 11/30/14 10:30 PM  Result Value Ref Range Status   Specimen Description URINE, CATHETERIZED  Final   Special Requests NONE  Final   Culture   Final    >=100,000 COLONIES/mL ESCHERICHIA COLI Confirmed Extended Spectrum Beta-Lactamase Producer (ESBL) Performed at [facility]    Report Status 12/04/2014 FINAL  Final   Organism ID, Bacteria ESCHERICHIA COLI  Final      Susceptibility   Escherichia coli - MIC*    AMPICILLIN >=32 RESISTANT Resistant     CEFAZOLIN >=64 RESISTANT Resistant     CEFTRIAXONE >=64 RESISTANT Resistant     CIPROFLOXACIN >=4 RESISTANT Resistant     GENTAMICIN >=16 RESISTANT Resistant     IMIPENEM <=0.25 SENSITIVE Sensitive     NITROFURANTOIN <=16 SENSITIVE Sensitive     TRIMETH/SULFA <=20 SENSITIVE Sensitive     AMPICILLIN/SULBACTAM >=32 RESISTANT Resistant     PIP/TAZO >=128 RESISTANT Resistant     * >=100,000 COLONIES/mL ESCHERICHIA COLI    Medical History: Past Medical History  Diagnosis Date  . Osteoporosis   . HTN  (hypertension)     unspecified  . Atrial fibrillation   . Macular degeneration   . Osteoarthritis   . Edema   . Anemia   . Hydronephrosis     left  . UTI (lower urinary tract infection)   . Pressure ulcer of back   . DM (diabetes mellitus)     lantus  . Depression   . Insomnia   . GERD (gastroesophageal reflux disease)   . CKD (chronic kidney disease), stage III   . Chronic diastolic CHF (congestive heart failure)     Medications:  See med rec  Assessment: Patient's a 81 y.o F with ESBL UTI from ucx on 9/24 and was treated with keflex PTA.  She was referred to the ED on 9/28 by her PCP for further workup of elevated LFTs and ammonia level. In the ED, she was found to have elevated WBC and LA.  To start abx for suspected sepsis secondary to UTI.  - wbc 21.2,  scr 4.49 (crcl~11, N), LA 2.20 - 9/28 CXR: R basilar atelectasis  9/28 vanc>> 9/28 primaxin>>  9/28 bcx x2: 9/28 ucx: 9/24 ucx: >100K ESBL Ecoli (S= primaxin, nitrof, bactrim)   Goal of Therapy:  Vancomycin trough level 15-20 mcg/ml  Plan:  - vancomycin 2gm IV x1.  With renal insufficiency, will check random vancomycin trough level in the next couple of days and re-dose if it's <20. - change primaxin to 250mg  IV q12h for renal function - f/u  cultures, renal function  Pham, Anh P 12/04/2014,8:50 PM

## 2014-12-04 NOTE — Progress Notes (Signed)
Patient ID: Kathryn [name], female   DOB: 11-23-1933, 81 y.o.   MRN: [id number]     [place]  Code Status: Full Code   Chief Complaint  Patient presents with  . Acute Visit    nausea, vomiting, e.coli uti   Allergies  Allergen Reactions  . Celebrex [Celecoxib]   . Nsaids   . Sulfa Antibiotics     HPI 81 y.o. female patient is seen for acute visit. She has been having nausea and vomiting and increased somnolence for last 2 days. Her labs from yesterday have resulted showing deranged LFT with ALP of 769. Her renal function has worsened on lab review. She is unable to take anything by mouth. Her ammonia level has been rising and is 335 today. She has hx of cholecystectomy. She is under total care. Reviewed her urine culture report from recent ED visit and is + for e.coli  ROS Patient falling asleep between conversation. She is alert and oriented to place and person  Denies fever or chills. Denies abdominal pain  Denies chest pain or new dyspnea Denies headache or dizziness  Past medical history reviewed  Past Surgical History  Procedure Laterality Date  . Replacement total knee  08/2007    bilateral  . Appendectomy    . Cholecystectomy    . Colonoscopy    . Upper gastrointestinal endoscopy    . Cystoscopy w/ ureteral stent placement  04/29/2011    Procedure: CYSTOSCOPY WITH RETROGRADE PYELOGRAM/URETERAL STENT PLACEMENT;  Surgeon: Bernestine Amass, MD;  Location: [facility];  Service: Urology;  Laterality: Bilateral;  bilateral retrograde, double j stent. Cystogram   . Ureteroscopy  04/29/2011    Procedure: URETEROSCOPY;  Surgeon: Bernestine Amass, MD;  Location: [facility];  Service: Urology;  Laterality: Right;  . Cystoscopy with ureteroscopy  05/31/2011    Procedure: CYSTOSCOPY WITH URETEROSCOPY;  Surgeon: Bernestine Amass, MD;  Location: [facility];  Service: Urology;  Laterality: Right;  Ureteral biopsy Right         Medication List       This list is accurate as  of: 12/04/14  4:08 PM.  Always use your most recent med list.               AMBULATORY NON FORMULARY MEDICATION  Medication Name: Med Pass 240 mL with water three times daily for hydration     amLODipine 10 MG tablet  Commonly known as:  NORVASC  Take 10 mg by mouth daily.     benzonatate 100 MG capsule  Commonly known as:  TESSALON  Take 100 mg by mouth 3 (three) times daily as needed for cough (x 7 days starting 08/20/14).     Calcium Carbonate-Vitamin D 600-400 MG-UNIT tablet  Take 1 tablet by mouth 2 (two) times daily.     cephALEXin 500 MG capsule  Commonly known as:  KEFLEX  Take 1 capsule (500 mg total) by mouth 3 (three) times daily.     cetirizine 10 MG tablet  Commonly known as:  ZYRTEC  Take 10 mg by mouth daily.     DECUBI-VITE Caps  Take by mouth. Take 1 capsule by mouth every day for wound healing     beta carotene w/minerals tablet  Take 1 tablet by mouth 2 (two) times daily. Take 1 tablet by mouth twice daily     gabapentin 300 MG capsule  Commonly known as:  NEURONTIN  Take 300 mg by mouth at bedtime. Take  1 capsule by mouth twice daily for neuropathy     hydrOXYzine 25 MG tablet  Commonly known as:  ATARAX/VISTARIL  Take 25 mg by mouth every 8 (eight) hours as needed for itching.     insulin glargine 100 UNIT/ML injection  Commonly known as:  LANTUS  Inject into the skin. Administer 15 ml SQ gd     insulin lispro 100 UNIT/ML cartridge  Commonly known as:  HUMALOG  Inject into the skin. Check blood sugar three times daily with meals if CBG> 200 inject 5 units subcutaneously     ipratropium-albuterol 0.5-2.5 (3) MG/3ML Soln  Commonly known as:  DUONEB  Take 3 mLs by nebulization. Administer 3 ml via nebulizer four times a day as needed for cough and wheezing     lisinopril 20 MG tablet  Commonly known as:  PRINIVIL,ZESTRIL  Take 40 mg by mouth daily. Take 1 tablet by mouth daily for HTN     Menthol (Topical Analgesic) 4 % Gel  Apply topically.  Apply to right shoulder twice a day for pain     metoprolol 50 MG tablet  Commonly known as:  LOPRESSOR  Take 50 mg by mouth 2 (two) times daily. Hold for HR < 60     oxybutynin 5 MG tablet  Commonly known as:  DITROPAN  Take 5 mg by mouth daily. For bladder spasms.     polyethylene glycol packet  Commonly known as:  MIRALAX / GLYCOLAX  Take 17 g by mouth every morning. For constipation     PROCEL Powd  Take by mouth. 1 scoop in 6-8 oz of liquid and take by mouth twice daily for nutritional support and increase protein     simethicone 80 MG chewable tablet  Commonly known as:  MYLICON  Chew 812 mg by mouth 3 (three) times daily. For gas pains.     Vitamin D3 2000 UNITS Tabs  Take by mouth daily.        Physical exam BP 135/65 mmHg  Pulse 88  Temp(Src) 97.3 F (36.3 C)  Resp 18  SpO2 98%  Constitutional: elderly obese female somnolent HEENT: Normocephalic and atraumatic. Mild pallor, no icterus Neck: Supple and nontender. No lymphadenopathy, no JVD   Cardiac: Normal S1, S2. RRR. Distal pulses diminished. 1+ dependent edema.   Lungs: No respiratory distress. Breath sounds clear bilaterally without rales, rhonchi, or wheezes. Abdomen: Audible bowel sounds in all quadrants. Soft, nontender, nondistended but obese. No palpable mass.   Musculoskeletal: generalized weakness, asterexis present in left hand, strength 4/5 in UE and 3/5 in LE Skin: Warm and dry. No petechiae or purpura  Psychiatric: unable to assess   Labs 12/02/14 na 136, k 4.2, bun 47, cr 2.81, ca 10.7, alb 3.25, t.bil 1.06,alp 769. ast 55, alt 49, ammonia 335 11/19/14 na 138, k 4.7, glu 109, bun 35, cr 1.49, alb 2.72, t.bil 3.59, alp 886, ast 152, alt 138, gfr 35.83,a1c 5.7, wbc 12.2, hb 10.6, mcv 103.5, plt 173, neutrophil 84 09/03/14 iron 94, tibc 215, normal b12 and folate, hb 10.6, na 140, k 4.4, bun 31, cr 1.27, alb 2.9 08/14/14 wbc 4.8, hb 10, bun 43, cr 1.39, alp 175, alb 3, alp 175, rest of LFT WNL 07/11/14  t.chol 112, tg 187, hdl 27, ldl 48   Assessment/plan  Elevated ALP With high GGT s/o hepatobiliary etiology. Her amylase/lipase have been normal. History of cholecystectomy. Concern for cholestasis. Recent CT abdomen was normal. With patient declining in terms of mental  status and having poor po intake with worsening ALP and ammonia level, sending her to ED for further evaluation and workup of her deranged LFT and GI consult.   AMS Her recent ongoing e.coli UTI and deranged LFT could both be contributing to this. Sending patient to ED for further evaluation/ treatment of acute encephalopathy  E.coli uti Reviewed culture and sensitivity report. Keflex was discontinued and started on macrobid 100 mg bid today but is now being sent to ED for further evaluation for deranged LFT and ammonia level  hyperammonemia Ammonia level continues to trend up, currently on lactulose with one bowel movement this am. With her nausea and vomiting unable to keep anything  Nausea and vomiting Her infection, being on antibiotic and deranged lft could all be contributing to this. On prn im zofran here. Sending to ED for further evaluation  Reviewed labs, imaging, and discussed plan of care with nursing and EMS  [facility], MD  [facility] [phone] (Monday-Friday 8 am - 5 pm) [phone] (afterhours)

## 2014-12-04 NOTE — H&P (Signed)
[facility] History and Physical  Kathryn [name] QJF:[id number] DOB: 1933-07-27 DOA: 12/04/2014  Referring physician: ED physician PCP: Blanchie Serve, MD  Specialists:   Chief Complaint: Nausea, vomiting, constipation, acute encephalopathy  HPI: Kathryn [name] is a 81 y.o. female with PMH of bedbound, dwelling catheter, hypertension, diabetes mellitus, GERD, depression, atrial fibrillation not on anticoagulant (unclear reason), recurrent UTI, diastolic congestive heart failure, CKD-III, who admitted because for nausea, vomiting, constipation, acute encephalopathy.  Patient reports that has been having nausea, vomiting for sever weeks, which has been worsening in past 2 days. She vomited more 10 times today without blood in the vomitus, but nurse noticed coffee-ground materials in ED. She does not have diarrhea or abdominal pain. No fever or chill. She is constipated in the past 4 days. She was taking Mirralax without any help. She had CT scan on 11/27/14 which showed mild intra and extrahepatic biliary dilatation which is likely due to the patient's age and prior cholecystectomy per etiologies, no obvious obstructing pancreatic head mass or common bile duct stone or ampullary lesion. She was found to have UTI and started with keflex. Her urine culture on 11/30/14 was positive for Escherichia coli, which are resistant to lactamase, Cipro, gentamicin and, Zosyn, and sensitive to imipenem, Bactrim, nitrofurantoin. She has dwelling catheter, and denies symptoms for UTI. Patient does not have chest pain, shortness breath, cough, rashes, unilateral weakness. Per her son, patient is mildly confused in the past several days. She is oriented x 3 when I saw patient in ED.   In ED, patient was found to have lactate 2.20,  WBC 21.2, tachycardia, temperature normal, INR 1.21, PTT 20,, urinalysis pending (she had positive urinalysis on 11/29/14), worsening renal function, abnormal liver function with ALT 665,  AST 53, ALT 48, total bilirubin 1.4, Guaiac positive in vomited gastric materials, chest x-ray no infiltration. CT-abd/pelvis w/o CM showed no acute abnormality, no hydronephrosis with status post prior cholecystectomy with postsurgical intra and extrahepatic biliary ductal dilatation. Patient is admitted to inpatient for further evaluation and treatment.  Where does patient live? [facility]    Can patient participate in ADLs?  None   Review of Systems:   General: no fevers, chills, no changes in body weight, has poor appetite, has fatigue HEENT: no blurry vision, hearing changes or sore throat Pulm: no dyspnea, coughing, wheezing CV: no chest pain, palpitations Abd: has nausea, vomiting, no abdominal pain, diarrhea, has constipation GU: no dysuria, burning on urination, increased urinary frequency, hematuria.  Ext: no leg edema Neuro: no unilateral weakness, numbness, or tingling, no vision change or hearing loss Skin: no rash MSK: No muscle spasm, no deformity, no limitation of range of movement in spin Heme: No easy bruising.  Travel history: No recent long distant travel.  Allergy:  Allergies  Allergen Reactions  . Celebrex [Celecoxib]   . Nsaids   . Sulfa Antibiotics     Past Medical History  Diagnosis Date  . Osteoporosis   . HTN (hypertension)     unspecified  . Atrial fibrillation   . Macular degeneration   . Osteoarthritis   . Edema   . Anemia   . Hydronephrosis     left  . UTI (lower urinary tract infection)   . Pressure ulcer of back   . DM (diabetes mellitus)     lantus  . Depression   . Insomnia   . GERD (gastroesophageal reflux disease)   . CKD (chronic kidney disease), stage III   .  Chronic diastolic CHF (congestive heart failure)     Past Surgical History  Procedure Laterality Date  . Replacement total knee  08/2007    bilateral  . Appendectomy    . Cholecystectomy    . Colonoscopy    . Upper gastrointestinal endoscopy    . Cystoscopy  w/ ureteral stent placement  04/29/2011    Procedure: CYSTOSCOPY WITH RETROGRADE PYELOGRAM/URETERAL STENT PLACEMENT;  Surgeon: Bernestine Amass, MD;  Location: [facility];  Service: Urology;  Laterality: Bilateral;  bilateral retrograde, double j stent. Cystogram   . Ureteroscopy  04/29/2011    Procedure: URETEROSCOPY;  Surgeon: Bernestine Amass, MD;  Location: [facility];  Service: Urology;  Laterality: Right;  . Cystoscopy with ureteroscopy  05/31/2011    Procedure: CYSTOSCOPY WITH URETEROSCOPY;  Surgeon: Bernestine Amass, MD;  Location: [facility];  Service: Urology;  Laterality: Right;  Ureteral biopsy Right      Social History:  reports that she has never smoked. She has never used smokeless tobacco. She reports that she does not drink alcohol or use illicit drugs.  Family History:  Family History  Problem Relation Age of Onset  . Diabetes Father   . Atrial fibrillation Father   . Breast cancer Paternal Grandmother   . Heart disease Mother      Prior to Admission medications   Medication Sig Start Date End Date Taking? Authorizing Provider  amLODipine (NORVASC) 10 MG tablet Take 10 mg by mouth daily.   Yes Historical Provider, MD  beta carotene w/minerals (OCUVITE) tablet Take 1 tablet by mouth 2 (two) times daily.    Yes Historical Provider, MD  Calcium Carbonate-Vitamin D 600-400 MG-UNIT per tablet Take 1 tablet by mouth 2 (two) times daily.    Yes Historical Provider, MD  cephALEXin (KEFLEX) 500 MG capsule Take 1 capsule (500 mg total) by mouth 3 (three) times daily. Patient taking differently: Take 500 mg by mouth 2 (two) times daily.  11/30/14  Yes Virgel Manifold, MD  Cholecalciferol (VITAMIN D3) 2000 UNITS TABS Take 1 tablet by mouth daily.    Yes Historical Provider, MD  gabapentin (NEURONTIN) 300 MG capsule Take 300 mg by mouth at bedtime.    Yes Historical Provider, MD  insulin lispro (HUMALOG) 100 UNIT/ML cartridge Inject into the skin. Check blood sugar three times daily with meals if CBG>  200 inject 5 units subcutaneously    Yes Historical Provider, MD  lisinopril (PRINIVIL,ZESTRIL) 40 MG tablet Take 40 mg by mouth daily.   Yes Historical Provider, MD  metoprolol (LOPRESSOR) 50 MG tablet Take 50 mg by mouth 2 (two) times daily. Hold for HR < 60 05/19/11  Yes Reynold Bowen, MD  Multiple Vitamins-Minerals (DECUBI-VITE) CAPS Take 1 capsule by mouth daily.    Yes Historical Provider, MD  ondansetron (ZOFRAN) 4 MG tablet Take 4 mg by mouth every 8 (eight) hours as needed for nausea or vomiting.   Yes Historical Provider, MD  Oxycodone HCl 10 MG TABS Take 10 mg by mouth daily.   Yes Historical Provider, MD  polyethylene glycol (MIRALAX / GLYCOLAX) packet Take 17 g by mouth every morning. For constipation   Yes Historical Provider, MD  Protein (PROCEL) POWD Take by mouth. 1 scoop in 6-8 oz of liquid and take by mouth twice daily for nutritional support and increase protein   Yes Historical Provider, MD  AMBULATORY NON FORMULARY MEDICATION Medication Name: Med Pass 240 mL with water three times daily for hydration    Historical Provider,  MD  benzonatate (TESSALON) 100 MG capsule Take 100 mg by mouth 3 (three) times daily as needed for cough (x 7 days starting 08/20/14).    Historical Provider, MD  hydrOXYzine (ATARAX/VISTARIL) 25 MG tablet Take 25 mg by mouth every 8 (eight) hours as needed for itching.    Historical Provider, MD  ipratropium-albuterol (DUONEB) 0.5-2.5 (3) MG/3ML SOLN Take 3 mLs by nebulization. Administer 3 ml via nebulizer four times a day as needed for cough and wheezing    Historical Provider, MD  simethicone (MYLICON) 80 MG chewable tablet Chew 160 mg by mouth 3 (three) times daily. For gas pains.    Historical Provider, MD    Physical Exam: Filed Vitals:   12/04/14 2200 12/04/14 2230 12/04/14 2250 12/04/14 2311  BP: 93/43  93/43 92/39  Pulse: 79  87 77  Temp:  98.5 F (36.9 C)    TempSrc:  Oral    Resp:   14 17  Height:  5\' 10"  (1.778 m)    Weight:  105.6 kg (232  lb 12.9 oz)    SpO2:   100% 100%   General: Not in acute distress. dry mucus and membrane HEENT:       Eyes: PERRL, EOMI, no scleral icterus.       ENT: No discharge from the ears and nose, no pharynx injection, no tonsillar enlargement.        Neck: No JVD, no bruit, no mass felt. Heme: No neck lymph node enlargement. Cardiac: S1/S2, RRR, No murmurs, No gallops or rubs. Pulm:  No rales, wheezing, rhonchi or rubs. Abd: Soft, nondistended, nontender, no rebound pain, no organomegaly, BS present. Ext: No pitting leg edema bilaterally. 2+DP/PT pulse bilaterally. Musculoskeletal: No joint deformities, No joint redness or warmth, no limitation of ROM in spin. Skin: No rashes. Has sacral decubitus ulcer Neuro: Alert, mildly confused, oriented X3, cranial nerves II-XII grossly intact, muscle strength 5/5 in all extremities, sensation to light touch intact.  Psych: Patient is not psychotic, no suicidal or hemocidal ideation.  Labs on Admission:  Basic Metabolic Panel:  Recent Labs Lab 11/29/14 1924 12/04/14 1804  NA 140 133*  K 4.1 5.0  CL 103 95*  CO2 26 21*  GLUCOSE 158* 242*  BUN 31* 60*  CREATININE 1.46* 4.49*  CALCIUM 10.6* 10.7*   Liver Function Tests:  Recent Labs Lab 11/29/14 1924 12/04/14 1804  AST 93* 53*  ALT 70* 48  ALKPHOS 872* 665*  BILITOT 1.1 1.4*  PROT 7.2 7.0  ALBUMIN 3.2* 3.2*   No results for input(s): LIPASE, AMYLASE in the last 168 hours.  Recent Labs Lab 11/29/14 1924 12/04/14 1805  AMMONIA 74* 11   CBC:  Recent Labs Lab 11/29/14 1924 12/04/14 1804 12/04/14 2127  WBC 6.8 21.2* 16.5*  NEUTROABS 4.3 20.2*  --   HGB 12.6 12.0 11.0*  HCT 37.0 35.5* 33.2*  MCV 101.1* 100.0 101.2*  PLT 329 299 267   Cardiac Enzymes: No results for input(s): CKTOTAL, CKMB, CKMBINDEX, TROPONINI in the last 168 hours.  BNP (last 3 results) No results for input(s): BNP in the last 8760 hours.  ProBNP (last 3 results) No results for input(s): PROBNP in  the last 8760 hours.  CBG: No results for input(s): GLUCAP in the last 168 hours.  Radiological Exams on Admission: Ct Abdomen Pelvis Wo Contrast  12/04/2014   CLINICAL DATA:  Nausea vomiting for 2 days.  EXAM: CT ABDOMEN AND PELVIS WITHOUT CONTRAST  TECHNIQUE: Multidetector CT imaging of the  abdomen and pelvis was performed following the standard protocol without IV contrast.  COMPARISON:  November 27, 2014  FINDINGS: Patient status post prior cholecystectomy with intra and extrahepatic biliary ductal dilatation. The liver is otherwise normal. The spleen, pancreas are normal. There is hypertrophy of bilateral adrenal glands unchanged compared to prior exam. There is no nephrolithiasis or hydroureteronephrosis bilaterally. There is mild scarring of both kidneys. There is atherosclerosis of the abdominal aorta without aneurysmal dilatation. Small lymph nodes are identified in the abdomen unchanged compared to prior exam.  There is no small bowel obstruction or diverticulitis. Patient is status post prior appendectomy. Nasogastric tube is identified with distal tip in the stomach.  The bladder is decompressed limiting evaluation. The uterus is normal. No focal pneumonia or pleural effusion is identified in the visualized lung bases. Degenerative joint changes of the spine are identified.  IMPRESSION: No acute abnormality identified in the abdomen and pelvis.  Status post prior cholecystectomy with postsurgical intra and extrahepatic biliary ductal dilatation.   Electronically Signed   By: Abelardo Diesel M.D.   On: 12/04/2014 22:24   Dg Chest Port 1 View  12/04/2014   CLINICAL DATA:  Hypoxia. Nausea and vomiting. Increased somnolence for 2 days.  EXAM: PORTABLE CHEST 1 VIEW  COMPARISON:  Frontal and lateral views 11/29/2014  FINDINGS: Lower lung volumes compared to prior exam accentuating bronchovascular structures and cardiac size. Again seen elevation of right hemidiaphragm with adjacent compressive  atelectasis. No confluent airspace disease, large pleural effusion, pneumothorax or pulmonary edema. The osseous structures are unchanged.  IMPRESSION: Lower lung volumes with right basilar atelectasis.   Electronically Signed   By: Jeb Levering M.D.   On: 12/04/2014 18:51    EKG: Independently reviewed.  Abnormal findings:  A fib, T-wave inversion laterally with.   Assessment/Plan Principal Problem:   Sepsis Active Problems:   Macular degeneration (senile) of retina   Essential hypertension   Atrial fibrillation   Osteoporosis   GERD (gastroesophageal reflux disease)   DM (diabetes mellitus), type 2 with renal complications   Acute renal failure superimposed on stage 3 chronic kidney disease   Protein-calorie malnutrition   Pressure ulcer, stage 3   Type 2 diabetes mellitus with diabetic polyneuropathy   Constipation, chronic   Major depression, chronic   Chronic diastolic CHF (congestive heart failure)   UTI (lower urinary tract infection)   UGI bleed   Nausea & vomiting   Acute encephalopathy   Decubitus ulcer of ankle, stage 3  Sepsis: Likely due to inappropriately treated UTI. Patient has been treated with Keflex for positive urinalysis with Escherichia coli, which are resistant to lactamase. She has abnormal liver function with elevated ALP 665, AST 53, ALT 48 and total bilirubin 1.4. She is s/p of cholecystectomy. Slight elevation of total BR is less likely due to duct obstruction. CT-abd/pelvis is negative for acute abnormalities.   -will admit to SDU -start IV Vancomycin and Primaxin -follow up Bx and Ux -Check GTT and direct BR -will get Procalcitonin and trend lactic acid levels per sepsis protocol. -IVF: 3L of NS bolus in ED, followed by 75 cc/h  UTI: -see above  Acute encephalopathy: She is mildly confused, but oriented 3. Likely due to sepsis and UTI. -Treat UTI and sepsis as above -Frequent neuro check  Nausea and vomiting: Etiology is not clear, likely  due to UTI and sepsis. CT abdomen/pelvis is negative for acute abnormalities. Other differential diagnosis include viral gastroenteritis and pancreatitis. -IVF as above -prn Zofran  for nausea, morphine for pain -Check lipase  HTN: -Hold amlodipine and lisinopril since patient blood pressure is a soft due to sepsis  Possible UGI: Nurse noticed coffee-ground material from her vomited gastric materials. It is likely due to acute gastritis or stress ulcers. Patient does not have history of cirrhosis. Her hemoglobin is 12.6 on 11/29/14-->12.0.   - INR/PTT/type & screen - ED started octreotide and IV Protonix, will d/c octreotide and continue IV Protonix - Avoid NSAIDs and SQ heparin - Maintain IV access (2 large bore IVs if possible). - Monitor closely and follow q6h cbc, transfuse as necessary.  Atrial Fibrillation: CHA2DS2-VASc Score is 6, needs oral anticoagulation. Patient is not on AC at home (unclear reason), now presents with possible GIB, should not start anticoagulants. Heart rate is OK.  -Continue metoprolol  GERD: -On IV protonix  DM-II: Last A1c 5.8 on 06/04/14, well controled. Patient is taking Humalog at home -SSI  AoCKD-III: Baseline Cre is 1.4 , her Cre is 4.49 on admission. Likely due to prerenal secondary to dehydration and continuation of ACEI or ATN. No hydronephrosis on CT abdomen/pelvis - IVF as above - Check FeNa - Follow up renal function by BMP - Hold lisinopril  Protein-calorie malnutrition: -Ensure when pt is able to eat.  Chronic diastolic CHF (congestive heart failure): 2-D echo on 2/13/9 showed EF 62 seated 5%. Patient is not on diuretics at home. No leg edema. Patient is clinically dry on admission. -check BNP -Continue metoprolol  Decubitus ulcer-stage 3: -Consult wound care team   DVT ppx: SCD Code Status: DNR Family Communication:  Yes, patient's son at bed side Disposition Plan: Admit to inpatient   Date of Service 12/05/2014    Ivor Costa [facility] Pager [phone]  If 7PM-7AM, please contact night-coverage [email or web address] Password [email or web address] 12/05/2014, 1:00 AM

## 2014-12-04 NOTE — ED Provider Notes (Signed)
CSN: [id number]     Arrival date & time 12/04/14  1642 History   First MD Initiated Contact with Patient 12/04/14 1728     Chief complaint: Abnormal lab  (Consider location/radiation/quality/duration/timing/severity/associated sxs/prior Treatment) HPI   Blood pressure 98/52, pulse 78, temperature 98.6 F (37 C), temperature source Oral, resp. rate 16, SpO2 86 %.  Kathryn [name] is a 81 y.o. female sent from [place] in rehabilitation with an ammonia level of 335 and increased somnolence with multiple episodes of nausea and vomiting over the last several weeks. Patient states she hasn't eaten anything in several days. Patient is taking Macrobid for UTI, was recently switched to Keflex. Patient denies dysuria, hematuria, flank pain, fever, chills, abdominal pain, chest pain, shortness of breath, headache.  Pt son has come into the ED after patient's workup has been initiated he states that patient has not been vomiting for weeks really just a few days and she was diagnosed with UTI when she was seen here for approximately week ago.  Past Medical History  Diagnosis Date  . Osteoporosis   . HTN (hypertension)     unspecified  . Atrial fibrillation   . Macular degeneration   . Osteoarthritis   . Edema   . Anemia   . Hydronephrosis     left  . UTI (lower urinary tract infection)   . Pressure ulcer of back   . DM (diabetes mellitus)     lantus  . Depression   . Insomnia   . GERD (gastroesophageal reflux disease)   . CKD (chronic kidney disease), stage III   . Chronic diastolic CHF (congestive heart failure)    Past Surgical History  Procedure Laterality Date  . Replacement total knee  08/2007    bilateral  . Appendectomy    . Cholecystectomy    . Colonoscopy    . Upper gastrointestinal endoscopy    . Cystoscopy w/ ureteral stent placement  04/29/2011    Procedure: CYSTOSCOPY WITH RETROGRADE PYELOGRAM/URETERAL STENT PLACEMENT;  Surgeon: Bernestine Amass, MD;  Location: [facility]  [facility];  Service: Urology;  Laterality: Bilateral;  bilateral retrograde, double j stent. Cystogram   . Ureteroscopy  04/29/2011    Procedure: URETEROSCOPY;  Surgeon: Bernestine Amass, MD;  Location: [facility];  Service: Urology;  Laterality: Right;  . Cystoscopy with ureteroscopy  05/31/2011    Procedure: CYSTOSCOPY WITH URETEROSCOPY;  Surgeon: Bernestine Amass, MD;  Location: [facility];  Service: Urology;  Laterality: Right;  Ureteral biopsy Right     Family History  Problem Relation Age of Onset  . Diabetes Father   . Atrial fibrillation Father   . Breast cancer Paternal Grandmother   . Heart disease Mother    Social History  Substance Use Topics  . Smoking status: Never Smoker   . Smokeless tobacco: Never Used  . Alcohol Use: No   OB History    No data available     Review of Systems  10 systems reviewed and found to be negative, except as noted in the HPI.   Allergies  Celebrex; Nsaids; and Sulfa antibiotics  Home Medications   Prior to Admission medications   Medication Sig Start Date End Date Taking? Authorizing Provider  amLODipine (NORVASC) 10 MG tablet Take 10 mg by mouth daily.   Yes Historical Provider, MD  beta carotene w/minerals (OCUVITE) tablet Take 1 tablet by mouth 2 (two) times daily.    Yes Historical Provider, MD  Calcium Carbonate-Vitamin D 600-400 MG-UNIT per  tablet Take 1 tablet by mouth 2 (two) times daily.    Yes Historical Provider, MD  cephALEXin (KEFLEX) 500 MG capsule Take 1 capsule (500 mg total) by mouth 3 (three) times daily. Patient taking differently: Take 500 mg by mouth 2 (two) times daily.  11/30/14  Yes Virgel Manifold, MD  Cholecalciferol (VITAMIN D3) 2000 UNITS TABS Take 1 tablet by mouth daily.    Yes Historical Provider, MD  gabapentin (NEURONTIN) 300 MG capsule Take 300 mg by mouth at bedtime.    Yes Historical Provider, MD  insulin lispro (HUMALOG) 100 UNIT/ML cartridge Inject into the skin. Check blood sugar three times daily with meals if CBG>  200 inject 5 units subcutaneously    Yes Historical Provider, MD  lisinopril (PRINIVIL,ZESTRIL) 40 MG tablet Take 40 mg by mouth daily.   Yes Historical Provider, MD  metoprolol (LOPRESSOR) 50 MG tablet Take 50 mg by mouth 2 (two) times daily. Hold for HR < 60 05/19/11  Yes Reynold Bowen, MD  Multiple Vitamins-Minerals (DECUBI-VITE) CAPS Take 1 capsule by mouth daily.    Yes Historical Provider, MD  ondansetron (ZOFRAN) 4 MG tablet Take 4 mg by mouth every 8 (eight) hours as needed for nausea or vomiting.   Yes Historical Provider, MD  Oxycodone HCl 10 MG TABS Take 10 mg by mouth daily.   Yes Historical Provider, MD  polyethylene glycol (MIRALAX / GLYCOLAX) packet Take 17 g by mouth every morning. For constipation   Yes Historical Provider, MD  Protein (PROCEL) POWD Take by mouth. 1 scoop in 6-8 oz of liquid and take by mouth twice daily for nutritional support and increase protein   Yes Historical Provider, MD  AMBULATORY NON FORMULARY MEDICATION Medication Name: Med Pass 240 mL with water three times daily for hydration    Historical Provider, MD  benzonatate (TESSALON) 100 MG capsule Take 100 mg by mouth 3 (three) times daily as needed for cough (x 7 days starting 08/20/14).    Historical Provider, MD  hydrOXYzine (ATARAX/VISTARIL) 25 MG tablet Take 25 mg by mouth every 8 (eight) hours as needed for itching.    Historical Provider, MD  ipratropium-albuterol (DUONEB) 0.5-2.5 (3) MG/3ML SOLN Take 3 mLs by nebulization. Administer 3 ml via nebulizer four times a day as needed for cough and wheezing    Historical Provider, MD  simethicone (MYLICON) 80 MG chewable tablet Chew 160 mg by mouth 3 (three) times daily. For gas pains.    Historical Provider, MD   BP 92/39 mmHg  Pulse 77  Temp(Src) 98.5 F (36.9 C) (Oral)  Resp 17  Ht 5\' 10"  (1.778 m)  Wt 232 lb 12.9 oz (105.6 kg)  BMI 33.40 kg/m2  SpO2 100% Physical Exam  Constitutional: She is oriented to person, place, and time. She appears  well-developed and well-nourished. No distress.  Obese  HENT:  Head: Normocephalic and atraumatic.  Severely dry mucous membranes  Eyes: Conjunctivae and EOM are normal. Pupils are equal, round, and reactive to light.  Neck: Normal range of motion.  Cardiovascular: Normal rate, regular rhythm and intact distal pulses.   Pulmonary/Chest: Effort normal and breath sounds normal.  Abdominal: Soft. Bowel sounds are normal. She exhibits no distension and no mass. There is no tenderness. There is no rebound and no guarding.  Musculoskeletal: Normal range of motion.  Neurological: She is alert and oriented to person, place, and time.  Skin: She is not diaphoretic.  Psychiatric: She has a normal mood and affect.  Nursing note  and vitals reviewed.   ED Course  Procedures (including critical care time)  CRITICAL CARE Performed by: Monico Blitz   Total critical care time: 40  Critical care time was exclusive of separately billable procedures and treating other patients.  Critical care was necessary to treat or prevent imminent or life-threatening deterioration.  Critical care was time spent personally by me on the following activities: development of treatment plan with patient and/or surrogate as well as nursing, discussions with consultants, evaluation of patient's response to treatment, examination of patient, obtaining history from patient or surrogate, ordering and performing treatments and interventions, ordering and review of laboratory studies, ordering and review of radiographic studies, pulse oximetry and re-evaluation of patient's condition.  Labs Review Labs Reviewed  CBC WITH DIFFERENTIAL/PLATELET - Abnormal; Notable for the following:    WBC 21.2 (*)    RBC 3.55 (*)    HCT 35.5 (*)    Neutro Abs 20.2 (*)    Lymphs Abs 0.6 (*)    All other components within normal limits  COMPREHENSIVE METABOLIC PANEL - Abnormal; Notable for the following:    Sodium 133 (*)     Chloride 95 (*)    CO2 21 (*)    Glucose, Bld 242 (*)    BUN 60 (*)    Creatinine, Ser 4.49 (*)    Calcium 10.7 (*)    Albumin 3.2 (*)    AST 53 (*)    Alkaline Phosphatase 665 (*)    Total Bilirubin 1.4 (*)    GFR calc non Af Amer 8 (*)    GFR calc Af Amer 10 (*)    Anion gap 17 (*)    All other components within normal limits  APTT - Abnormal; Notable for the following:    aPTT 20 (*)    All other components within normal limits  PROTIME-INR - Abnormal; Notable for the following:    Prothrombin Time 15.5 (*)    All other components within normal limits  OCCULT BLOOD GASTRIC / DUODENUM (SPECIMEN CUP) - Abnormal; Notable for the following:    Occult Blood, Gastric POSITIVE (*)    All other components within normal limits  I-STAT CG4 LACTIC ACID, ED - Abnormal; Notable for the following:    Lactic Acid, Venous 2.20 (*)    All other components within normal limits  URINE CULTURE  CULTURE, BLOOD (ROUTINE X 2)  CULTURE, BLOOD (ROUTINE X 2)  MRSA PCR SCREENING  AMMONIA  URINALYSIS, ROUTINE W REFLEX MICROSCOPIC (NOT AT [facility])  CBC  CBC  CBC  BRAIN NATRIURETIC PEPTIDE  BILIRUBIN, DIRECT  CREATININE, URINE, RANDOM  SODIUM, URINE, RANDOM  COMPREHENSIVE METABOLIC PANEL  CBC  CBC    Imaging Review Ct Abdomen Pelvis Wo Contrast  12/04/2014   CLINICAL DATA:  Nausea vomiting for 2 days.  EXAM: CT ABDOMEN AND PELVIS WITHOUT CONTRAST  TECHNIQUE: Multidetector CT imaging of the abdomen and pelvis was performed following the standard protocol without IV contrast.  COMPARISON:  November 27, 2014  FINDINGS: Patient status post prior cholecystectomy with intra and extrahepatic biliary ductal dilatation. The liver is otherwise normal. The spleen, pancreas are normal. There is hypertrophy of bilateral adrenal glands unchanged compared to prior exam. There is no nephrolithiasis or hydroureteronephrosis bilaterally. There is mild scarring of both kidneys. There is atherosclerosis of the  abdominal aorta without aneurysmal dilatation. Small lymph nodes are identified in the abdomen unchanged compared to prior exam.  There is no small bowel obstruction or diverticulitis. Patient is  status post prior appendectomy. Nasogastric tube is identified with distal tip in the stomach.  The bladder is decompressed limiting evaluation. The uterus is normal. No focal pneumonia or pleural effusion is identified in the visualized lung bases. Degenerative joint changes of the spine are identified.  IMPRESSION: No acute abnormality identified in the abdomen and pelvis.  Status post prior cholecystectomy with postsurgical intra and extrahepatic biliary ductal dilatation.   Electronically Signed   By: Abelardo Diesel M.D.   On: 12/04/2014 22:24   Dg Chest Port 1 View  12/04/2014   CLINICAL DATA:  Hypoxia. Nausea and vomiting. Increased somnolence for 2 days.  EXAM: PORTABLE CHEST 1 VIEW  COMPARISON:  Frontal and lateral views 11/29/2014  FINDINGS: Lower lung volumes compared to prior exam accentuating bronchovascular structures and cardiac size. Again seen elevation of right hemidiaphragm with adjacent compressive atelectasis. No confluent airspace disease, large pleural effusion, pneumothorax or pulmonary edema. The osseous structures are unchanged.  IMPRESSION: Lower lung volumes with right basilar atelectasis.   Electronically Signed   By: Jeb Levering M.D.   On: 12/04/2014 18:51   I have personally reviewed and evaluated these images and lab results as part of my medical decision-making.   EKG Interpretation None      MDM   Final diagnoses:  UGI bleed  AKI (acute kidney injury)  Emesis, persistent    Filed Vitals:   12/04/14 2200 12/04/14 2230 12/04/14 2250 12/04/14 2311  BP: 93/43  93/43 92/39  Pulse: 79  87 77  Temp:  98.5 F (36.9 C)    TempSrc:  Oral    Resp:   14 17  Height:  5\' 10"  (1.778 m)    Weight:  232 lb 12.9 oz (105.6 kg)    SpO2:   100% 100%    Medications   pantoprazole (PROTONIX) 80 mg in sodium chloride 0.9 % 250 mL (0.32 mg/mL) infusion (8 mg/hr Intravenous New Bag/Given 12/04/14 2030)  sodium chloride 0.9 % bolus 1,000 mL (not administered)  vancomycin (VANCOCIN) 2,000 mg in sodium chloride 0.9 % 500 mL IVPB (2,000 mg Intravenous Given 12/04/14 2313)  imipenem-cilastatin (PRIMAXIN) 250 mg in sodium chloride 0.9 % 100 mL IVPB (not administered)  0.9 %  sodium chloride infusion ( Intravenous New Bag/Given 12/04/14 2315)  ondansetron (ZOFRAN) injection 4 mg (not administered)  hydrOXYzine (ATARAX/VISTARIL) tablet 25 mg (not administered)  benzonatate (TESSALON) capsule 100 mg (not administered)  beta carotene w/minerals (OCUVITE) tablet 1 tablet (0 tablets Oral Hold 12/04/14 2337)  gabapentin (NEURONTIN) capsule 300 mg (300 mg Oral Given 12/04/14 2300)  ipratropium-albuterol (DUONEB) 0.5-2.5 (3) MG/3ML nebulizer solution 3 mL (not administered)  metoprolol tartrate (LOPRESSOR) tablet 50 mg (50 mg Oral Not Given 12/04/14 2200)  polyethylene glycol (MIRALAX / GLYCOLAX) packet 17 g (not administered)  feeding supplement (ENSURE ENLIVE) (ENSURE ENLIVE) liquid 237 mL (not administered)  morphine 2 MG/ML injection 2 mg (not administered)  oxyCODONE (Oxy IR/ROXICODONE) immediate release tablet 10 mg (not administered)  sodium chloride 0.9 % injection 3 mL (3 mLs Intravenous Given 12/04/14 2300)  cholecalciferol (VITAMIN D) tablet 400 Units (not administered)  multivitamin with minerals tablet 1 tablet (not administered)  calcium-vitamin D (OSCAL WITH D) 500-200 MG-UNIT per tablet 1 tablet (0 tablets Oral Hold 12/04/14 2337)  ondansetron (ZOFRAN) injection 4 mg (4 mg Intravenous Given 12/04/14 1827)  octreotide (SANDOSTATIN) 2 mcg/mL load via infusion 50 mcg (50 mcg Intravenous Given 12/04/14 2011)  pantoprazole (PROTONIX) 80 mg in sodium chloride 0.9 % 100  mL IVPB (0 mg Intravenous Stopped 12/04/14 2056)  sodium chloride 0.9 % bolus 1,000 mL (1,000 mLs  Intravenous New Bag/Given 12/04/14 2030)  lidocaine (XYLOCAINE) 2 % viscous mouth solution (  Given 12/04/14 2040)  imipenem-cilastatin (PRIMAXIN) 250 mg in sodium chloride 0.9 % 100 mL IVPB (250 mg Intravenous New Bag/Given 12/04/14 2151)    Kathryn [name] is a pleasant 81 y.o. female presenting with sent for evaluation of lethargy and increased ammonia level. As per pt she has been vomiting x 2 weeks.   Patient is found to have a significantly elevated creatinine and BUN. Creatinine is 4.49 and previous measurement 5 days ago was 1.46. Patient had vomiting in the ED is described as water with brown specks, this wasn't grossly coffee-ground however Gastroccult is positive. Patient is given Protonix bolus and drip and started on octreotide.   Case discussed with Dr. Blaine Hamper who accepts the patient to a step down bed   [facility], PA-C 12/04/14 [place], DO 12/07/14 [phone]

## 2014-12-04 NOTE — ED Notes (Signed)
From [facility] via [facility] ammonia of 335. Was 205 on 9/19 and 240 on 9/21. Increased somnolence, deranged LFT with ALP 769 and elevated WBC with shift as well. Nausea and vomiting.   Currently taking Macrobid for UTI, was switched from Keflex to [facility] today.

## 2014-12-04 NOTE — ED Notes (Signed)
Attempted x2 to perform in and out cath. Unsuccessful. Urine noted to have large amounts of sediment and mucous in it. ICU RN aware.

## 2014-12-04 NOTE — ED Notes (Signed)
Pt called 3x. No answer  °

## 2014-12-04 NOTE — ED Notes (Signed)
Pt given ice water per PA. Pt made aware we need a urine sample. Pt stated she has not been able to keep anything down so she will have to wait and see if the water makes her have to go.

## 2014-12-04 NOTE — ED Notes (Signed)
Admitting MD at bedside.

## 2014-12-04 NOTE — Progress Notes (Signed)
CSW attempted to speak with patient at bedside. However, xray is present conducting a procedure. CSW will attempt to speak with patient again.   Willette Brace [phone] ED CSW 12/04/2014 6:50 PM

## 2014-12-04 NOTE — ED Notes (Signed)
Bed: WA11 Expected date:  Expected time:  Means of arrival:  Comments: ems 

## 2014-12-04 NOTE — ED Notes (Signed)
Pt cannot tolerate lying flat. Vomits immediately with lowering of head of bed. Will attempt I&O cath for urine sample at later time.

## 2014-12-05 ENCOUNTER — Inpatient Hospital Stay (HOSPITAL_COMMUNITY): Payer: Medicare Other

## 2014-12-05 DIAGNOSIS — R945 Abnormal results of liver function studies: Secondary | ICD-10-CM | POA: Insufficient documentation

## 2014-12-05 DIAGNOSIS — L89503 Pressure ulcer of unspecified ankle, stage 3: Secondary | ICD-10-CM | POA: Diagnosis present

## 2014-12-05 DIAGNOSIS — R7989 Other specified abnormal findings of blood chemistry: Secondary | ICD-10-CM | POA: Insufficient documentation

## 2014-12-05 DIAGNOSIS — G9340 Encephalopathy, unspecified: Secondary | ICD-10-CM | POA: Diagnosis present

## 2014-12-05 DIAGNOSIS — K922 Gastrointestinal hemorrhage, unspecified: Secondary | ICD-10-CM

## 2014-12-05 LAB — COMPREHENSIVE METABOLIC PANEL
ALK PHOS: 519 U/L — AB (ref 38–126)
ALT: 37 U/L (ref 14–54)
ANION GAP: 14 (ref 5–15)
AST: 39 U/L (ref 15–41)
Albumin: 2.7 g/dL — ABNORMAL LOW (ref 3.5–5.0)
BILIRUBIN TOTAL: 1.4 mg/dL — AB (ref 0.3–1.2)
BUN: 59 mg/dL — ABNORMAL HIGH (ref 6–20)
CALCIUM: 9 mg/dL (ref 8.9–10.3)
CO2: 18 mmol/L — AB (ref 22–32)
CREATININE: 4.15 mg/dL — AB (ref 0.44–1.00)
Chloride: 101 mmol/L (ref 101–111)
GFR, EST AFRICAN AMERICAN: 11 mL/min — AB (ref >60)
GFR, EST NON AFRICAN AMERICAN: 9 mL/min — AB (ref >60)
Glucose, Bld: 231 mg/dL — ABNORMAL HIGH (ref 65–99)
Potassium: 5 mmol/L (ref 3.5–5.1)
SODIUM: 133 mmol/L — AB (ref 135–145)
TOTAL PROTEIN: 5.8 g/dL — AB (ref 6.5–8.1)

## 2014-12-05 LAB — CBC
HCT: 31.9 % — ABNORMAL LOW (ref 36.0–46.0)
HEMATOCRIT: 31 % — AB (ref 36.0–46.0)
Hemoglobin: 10.2 g/dL — ABNORMAL LOW (ref 12.0–15.0)
Hemoglobin: 10.5 g/dL — ABNORMAL LOW (ref 12.0–15.0)
MCH: 33.3 pg (ref 26.0–34.0)
MCH: 34 pg (ref 26.0–34.0)
MCHC: 32.9 g/dL (ref 30.0–36.0)
MCHC: 32.9 g/dL (ref 30.0–36.0)
MCV: 101.3 fL — ABNORMAL HIGH (ref 78.0–100.0)
MCV: 103.2 fL — ABNORMAL HIGH (ref 78.0–100.0)
PLATELETS: 281 10*3/uL (ref 150–400)
Platelets: 239 10*3/uL (ref 150–400)
RBC: 3.06 MIL/uL — ABNORMAL LOW (ref 3.87–5.11)
RBC: 3.09 MIL/uL — AB (ref 3.87–5.11)
RDW: 13.1 % (ref 11.5–15.5)
RDW: 13.2 % (ref 11.5–15.5)
WBC: 15.7 10*3/uL — ABNORMAL HIGH (ref 4.0–10.5)
WBC: 14.6 10*3/uL — ABNORMAL HIGH (ref 4.0–10.5)

## 2014-12-05 LAB — TYPE AND SCREEN
ABO/RH(D): A NEG
Antibody Screen: NEGATIVE

## 2014-12-05 LAB — GLUCOSE, CAPILLARY
GLUCOSE-CAPILLARY: 134 mg/dL — AB (ref 65–99)
Glucose-Capillary: 226 mg/dL — ABNORMAL HIGH (ref 65–99)
Glucose-Capillary: 207 mg/dL — ABNORMAL HIGH (ref 65–99)
Glucose-Capillary: 215 mg/dL — ABNORMAL HIGH (ref 65–99)
Glucose-Capillary: 194 mg/dL — ABNORMAL HIGH (ref 65–99)

## 2014-12-05 LAB — LACTIC ACID, PLASMA
LACTIC ACID, VENOUS: 1.8 mmol/L (ref 0.5–2.0)
LACTIC ACID, VENOUS: 1.8 mmol/L (ref 0.5–2.0)

## 2014-12-05 LAB — GAMMA GT: GGT: 550 U/L — ABNORMAL HIGH (ref 7–50)

## 2014-12-05 LAB — LIPASE, BLOOD: LIPASE: 12 U/L — AB (ref 22–51)

## 2014-12-05 LAB — PROCALCITONIN: PROCALCITONIN: 2.47 ng/mL

## 2014-12-05 LAB — BRAIN NATRIURETIC PEPTIDE: B NATRIURETIC PEPTIDE 5: 167.5 pg/mL — AB (ref 0.0–100.0)

## 2014-12-05 LAB — BILIRUBIN, DIRECT: Bilirubin, Direct: 0.4 mg/dL (ref 0.1–0.5)

## 2014-12-05 LAB — MRSA PCR SCREENING: MRSA by PCR: POSITIVE — AB

## 2014-12-05 MED ORDER — OXYCODONE HCL 5 MG PO TABS: 5.0000 mg | ORAL_TABLET | Freq: Two times a day (BID) | ORAL | Status: DC | PRN

## 2014-12-05 MED ORDER — SODIUM CHLORIDE 0.9 % IV BOLUS (SEPSIS)
1000.0000 mL | Freq: Once | INTRAVENOUS | Status: AC
  Administered 2014-12-05: 1000 mL via INTRAVENOUS

## 2014-12-05 MED ORDER — LACTULOSE 10 GM/15ML PO SOLN
20.0000 g | Freq: Two times a day (BID) | ORAL | Status: DC
  Administered 2014-12-05 (×2): 20 g via ORAL
  Filled 2014-12-05 (×2): qty 30

## 2014-12-05 MED ORDER — CHLORHEXIDINE GLUCONATE CLOTH 2 % EX PADS
6.0000 | MEDICATED_PAD | Freq: Every day | CUTANEOUS | Status: AC
  Administered 2014-12-05 – 2014-12-09 (×5): 6 via TOPICAL

## 2014-12-05 MED ORDER — INSULIN ASPART 100 UNIT/ML ~~LOC~~ SOLN
0.0000 [IU] | Freq: Three times a day (TID) | SUBCUTANEOUS | Status: DC
  Administered 2014-12-05: 2 [IU] via SUBCUTANEOUS
  Administered 2014-12-05 (×2): 3 [IU] via SUBCUTANEOUS
  Administered 2014-12-06 (×3): 2 [IU] via SUBCUTANEOUS
  Administered 2014-12-07: 4 [IU] via SUBCUTANEOUS
  Administered 2014-12-07: 2 [IU] via SUBCUTANEOUS
  Administered 2014-12-08 (×2): 5 [IU] via SUBCUTANEOUS
  Administered 2014-12-09: 1 [IU] via SUBCUTANEOUS
  Administered 2014-12-09 – 2014-12-11 (×5): 2 [IU] via SUBCUTANEOUS
  Administered 2014-12-11: 1 [IU] via SUBCUTANEOUS
  Administered 2014-12-11 – 2014-12-13 (×5): 2 [IU] via SUBCUTANEOUS
  Administered 2014-12-13: 1 [IU] via SUBCUTANEOUS

## 2014-12-05 MED ORDER — MUPIROCIN 2 % EX OINT
1.0000 "application " | TOPICAL_OINTMENT | Freq: Two times a day (BID) | CUTANEOUS | Status: AC
  Administered 2014-12-05 – 2014-12-09 (×10): 1 via NASAL
  Filled 2014-12-05 (×3): qty 22

## 2014-12-05 MED ORDER — GLYCERIN (LAXATIVE) 2.1 G RE SUPP
1.0000 | Freq: Every day | RECTAL | Status: DC | PRN
  Filled 2014-12-05 (×2): qty 1

## 2014-12-05 NOTE — Progress Notes (Signed)
2 RN's attempted to insert foley without success. MD aware. Will continue to monitor and bladder scan q2h.

## 2014-12-05 NOTE — Consult Note (Signed)
Referring Provider:  [facility] Primary Care Physician:  Blanchie Serve, MD Primary Gastroenterologist:  Dr.Pyrtle  Reason for Consultation:   Hematemesis, abnormal LFTs    HPI: Kathryn [name] is a 81 y.o. female  of 2-1/2-3 weeks duration which has intensified over the past 3 days. Yesterday she vomited approximately 12 times. She never saw blood in the vomitus, but the nurse at the skilled nursing facility that she resides at noted coffee ground like materials. This was also noted in the emergency room. She has not had diarrhea but has intermittent epigastric pain that comes and goes. She has had no fever or chills. She was noted to have a mild elevation of her LFTs about a week and a half ago. Dilatation which was felt to be due to the patient's age and prior cholecystectomy. No obstructing pancreatic mass, common bile duct stone or ampullary lesions were identified. She was also noted to have a UTI about a week and a half ago and was started on Keflex. When she came to the emergency room she was noted to have an elevated white blood cell count at 21.2 with abnormal liver functions of ALT 665, AST 53, ALT 48, and total bili 1.4. Chest x-ray had no infiltration. CT of the abdomen and pelvis showed no acute abnormalities INR was normal at 1.21.  Review of her chart shows that she has been seen by Dr. Elmo Putt in the past. She was evaluated in 2012 for anemia. She had an upper and lower endoscopy in the hospital. Upper endoscopy was notable only for mild gastritis which was H. pylori negative on biopsy. Colonoscopy revealed sigmoid diverticulosis along with internal and external hemorrhoids but was otherwise normal. She has a history of atrial fibrillation not on anticoagulation,, hypertension, diabetes, hypercholesterolemia, osteoporosis, diastolic CHF, and CK D stage III.  Past Medical History  Diagnosis Date  . Osteoporosis   . HTN (hypertension)     unspecified  . Atrial fibrillation     . Macular degeneration   . Osteoarthritis   . Edema   . Anemia   . Hydronephrosis     left  . UTI (lower urinary tract infection)   . Pressure ulcer of back   . DM (diabetes mellitus)     lantus  . Depression   . Insomnia   . GERD (gastroesophageal reflux disease)   . CKD (chronic kidney disease), stage III   . Chronic diastolic CHF (congestive heart failure)     Past Surgical History  Procedure Laterality Date  . Replacement total knee  08/2007    bilateral  . Appendectomy    . Cholecystectomy    . Colonoscopy    . Upper gastrointestinal endoscopy    . Cystoscopy w/ ureteral stent placement  04/29/2011    Procedure: CYSTOSCOPY WITH RETROGRADE PYELOGRAM/URETERAL STENT PLACEMENT;  Surgeon: Bernestine Amass, MD;  Location: [facility];  Service: Urology;  Laterality: Bilateral;  bilateral retrograde, double j stent. Cystogram   . Ureteroscopy  04/29/2011    Procedure: URETEROSCOPY;  Surgeon: Bernestine Amass, MD;  Location: [facility];  Service: Urology;  Laterality: Right;  . Cystoscopy with ureteroscopy  05/31/2011    Procedure: CYSTOSCOPY WITH URETEROSCOPY;  Surgeon: Bernestine Amass, MD;  Location: [facility];  Service: Urology;  Laterality: Right;  Ureteral biopsy Right      Prior to Admission medications   Medication Sig Start Date End Date Taking? Authorizing Provider  amLODipine (NORVASC) 10 MG tablet Take 10 mg by  mouth daily.   Yes Historical Provider, MD  beta carotene w/minerals (OCUVITE) tablet Take 1 tablet by mouth 2 (two) times daily.    Yes Historical Provider, MD  Calcium Carbonate-Vitamin D 600-400 MG-UNIT per tablet Take 1 tablet by mouth 2 (two) times daily.    Yes Historical Provider, MD  cephALEXin (KEFLEX) 500 MG capsule Take 1 capsule (500 mg total) by mouth 3 (three) times daily. Patient taking differently: Take 500 mg by mouth 2 (two) times daily.  11/30/14  Yes Virgel Manifold, MD  Cholecalciferol (VITAMIN D3) 2000 UNITS TABS Take 1 tablet by mouth daily.    Yes  Historical Provider, MD  gabapentin (NEURONTIN) 300 MG capsule Take 300 mg by mouth at bedtime.    Yes Historical Provider, MD  insulin lispro (HUMALOG) 100 UNIT/ML cartridge Inject into the skin. Check blood sugar three times daily with meals if CBG> 200 inject 5 units subcutaneously    Yes Historical Provider, MD  lisinopril (PRINIVIL,ZESTRIL) 40 MG tablet Take 40 mg by mouth daily.   Yes Historical Provider, MD  metoprolol (LOPRESSOR) 50 MG tablet Take 50 mg by mouth 2 (two) times daily. Hold for HR < 60 05/19/11  Yes Reynold Bowen, MD  Multiple Vitamins-Minerals (DECUBI-VITE) CAPS Take 1 capsule by mouth daily.    Yes Historical Provider, MD  ondansetron (ZOFRAN) 4 MG tablet Take 4 mg by mouth every 8 (eight) hours as needed for nausea or vomiting.   Yes Historical Provider, MD  Oxycodone HCl 10 MG TABS Take 10 mg by mouth daily.   Yes Historical Provider, MD  polyethylene glycol (MIRALAX / GLYCOLAX) packet Take 17 g by mouth every morning. For constipation   Yes Historical Provider, MD  Protein (PROCEL) POWD Take by mouth. 1 scoop in 6-8 oz of liquid and take by mouth twice daily for nutritional support and increase protein   Yes Historical Provider, MD  AMBULATORY NON FORMULARY MEDICATION Medication Name: Med Pass 240 mL with water three times daily for hydration    Historical Provider, MD  benzonatate (TESSALON) 100 MG capsule Take 100 mg by mouth 3 (three) times daily as needed for cough (x 7 days starting 08/20/14).    Historical Provider, MD  hydrOXYzine (ATARAX/VISTARIL) 25 MG tablet Take 25 mg by mouth every 8 (eight) hours as needed for itching.    Historical Provider, MD  ipratropium-albuterol (DUONEB) 0.5-2.5 (3) MG/3ML SOLN Take 3 mLs by nebulization. Administer 3 ml via nebulizer four times a day as needed for cough and wheezing    Historical Provider, MD  simethicone (MYLICON) 80 MG chewable tablet Chew 160 mg by mouth 3 (three) times daily. For gas pains.    Historical Provider, MD     Current Facility-Administered Medications  Medication Dose Route Frequency Provider Last Rate Last Dose  . 0.9 %  sodium chloride infusion   Intravenous Continuous Ivor Costa, MD 75 mL/hr at 12/05/14 0100    . benzonatate (TESSALON) capsule 100 mg  100 mg Oral TID PRN Ivor Costa, MD      . beta carotene w/minerals (OCUVITE) tablet 1 tablet  1 tablet Oral BID Ivor Costa, MD   1 tablet at 12/05/14 1032  . calcium-vitamin D (OSCAL WITH D) 500-200 MG-UNIT per tablet 1 tablet  1 tablet Oral BID Ivor Costa, MD   1 tablet at 12/05/14 1032  . Chlorhexidine Gluconate Cloth 2 % PADS 6 each  6 each Topical Q0600 Ivor Costa, MD   6 each at 12/05/14 0600  .  cholecalciferol (VITAMIN D) tablet 400 Units  400 Units Oral Daily Ivor Costa, MD   400 Units at 12/05/14 1032  . feeding supplement (ENSURE ENLIVE) (ENSURE ENLIVE) liquid 237 mL  237 mL Oral BID BM Ivor Costa, MD   237 mL at 12/05/14 1033  . gabapentin (NEURONTIN) capsule 300 mg  300 mg Oral QHS Ivor Costa, MD   300 mg at 12/04/14 2300  . hydrOXYzine (ATARAX/VISTARIL) tablet 25 mg  25 mg Oral Q8H PRN Ivor Costa, MD      . imipenem-cilastatin (PRIMAXIN) 250 mg in sodium chloride 0.9 % 100 mL IVPB  250 mg Intravenous Q12H Anh P Pham, RPH   250 mg at 12/05/14 1033  . insulin aspart (novoLOG) injection 0-9 Units  0-9 Units Subcutaneous TID WC Ivor Costa, MD   3 Units at 12/05/14 1034  . ipratropium-albuterol (DUONEB) 0.5-2.5 (3) MG/3ML nebulizer solution 3 mL  3 mL Nebulization Q4H PRN Ivor Costa, MD      . lactulose (CHRONULAC) 10 GM/15ML solution 20 g  20 g Oral BID Belkys A Regalado, MD   20 g at 12/05/14 1032  . morphine 2 MG/ML injection 2 mg  2 mg Intravenous Q4H PRN Ivor Costa, MD      . multivitamin with minerals tablet 1 tablet  1 tablet Oral Daily Ivor Costa, MD   1 tablet at 12/05/14 1035  . mupirocin ointment (BACTROBAN) 2 % 1 application  1 application Nasal BID Ivor Costa, MD   1 application at 62/83/15 1034  . ondansetron (ZOFRAN) injection 4 mg  4 mg  Intravenous Q8H PRN Ivor Costa, MD      . oxyCODONE (Oxy IR/ROXICODONE) immediate release tablet 5 mg  5 mg Oral Q12H PRN Belkys A Regalado, MD      . pantoprazole (PROTONIX) 80 mg in sodium chloride 0.9 % 250 mL (0.32 mg/mL) infusion  8 mg/hr Intravenous Continuous Nicole Pisciotta, PA-C 25 mL/hr at 12/05/14 0542 8 mg/hr at 12/05/14 0542  . polyethylene glycol (MIRALAX / GLYCOLAX) packet 17 g  17 g Oral Daily Ivor Costa, MD   17 g at 12/05/14 1032  . sodium chloride 0.9 % bolus 1,000 mL  1,000 mL Intravenous Once Ivor Costa, MD      . sodium chloride 0.9 % injection 3 mL  3 mL Intravenous Q12H Ivor Costa, MD   3 mL at 12/05/14 1034    Allergies as of 12/04/2014 - Review Complete 12/04/2014  Allergen Reaction Noted  . Celebrex [celecoxib]  04/17/2011  . Nsaids  04/17/2011  . Sulfa antibiotics  04/17/2011    Family History  Problem Relation Age of Onset  . Diabetes Father   . Atrial fibrillation Father   . Breast cancer Paternal Grandmother   . Heart disease Mother     Social History   Social History  . Marital Status: Married    Spouse Name: N/A  . Number of Children: 1  . Years of Education: N/A   Occupational History  . Retired   .     Social History Main Topics  . Smoking status: Never Smoker   . Smokeless tobacco: Never Used  . Alcohol Use: No  . Drug Use: No  . Sexual Activity: No   Other Topics Concern  . Not on file   Social History Narrative    Review of Systems: Gen: Denies fever or chills, has poor appetite and fatigue CV: Denies chest pain, angina, palpitations, syncope, orthopnea, PND, peripheral edema, and claudication. Resp:  Denies dyspnea at rest, dyspnea with exercise, cough, sputum, wheezing, coughing up blood, and pleurisy. GI: Has been constipated. Has nausea vomiting no abdominal pain. GU : Denies urinary burning, blood in urine, urinary frequency, urinary hesitancy, nocturnal urination, and urinary incontinence. MS: Denies joint pain, limitation  of movement, and swelling, stiffness, low back pain, extremity pain. Denies muscle weakness, cramps, atrophy.  Derm: Denies rash, itching, dry skin, hives, moles, warts, or unhealing ulcers.  Psych: Denies depression, anxiety, memory loss, suicidal ideation, hallucinations, paranoia, and confusion. Heme: Denies bruising, bleeding, and enlarged lymph nodes. Neuro:  Denies any headaches, dizziness, paresthesias.   Physical Exam: Vital signs in last 24 hours: Temp:  [97.3 F (36.3 C)-98.8 F (37.1 C)] 98.4 F (36.9 C) (09/29 0800) Pulse Rate:  [25-107] 90 (09/29 0534) Resp:  [11-19] 15 (09/29 0534) BP: (78-135)/(32-93) 98/60 mmHg (09/29 0534) SpO2:  [74 %-100 %] 100 % (09/29 0534) Weight:  [232 lb 12.9 oz (105.6 kg)] 232 lb 12.9 oz (105.6 kg) (09/28 2230)   General:   Alert, mildly confused,  chronically ill appearing elderly Caucasian female in no apparent distress Head:  Normocephalic and atraumatic. Eyes:  Sclera clear, no icterus. Conjunctiva pink. Ears:  Normal auditory acuity. Nose:  No deformity, discharge,  or lesions. Mouth:  No deformity or lesions.   Neck:  Supple; no masses or thyromegaly. Lungs:  Clear throughout to auscultation.     Heart:  Regular rate and rhythm Abdomen:  Soft,nontender, BS active,nonpalp mass or hsm.   Rectal:  Deferred  Msk:  Symmetrical without gross deformities. . Pulses:  Normal pulses noted. Extremities:1+ LE edema Neurologic: Alert, mildly confused, moving all extremities. Skin: Sacral decubitus ulcer Psych: Alert, mildly confused  Intake/Output from previous day: 09/28 0701 - 09/29 0700 In: 3337.5 [I.V.:837.5; IV Piggyback:2500] Out: -  Intake/Output this shift:    Lab Results:  Recent Labs  12/04/14 2127 12/05/14 0325 12/05/14 0937  WBC 16.5* 15.7* 14.6*  HGB 11.0* 10.2* 10.5*  HCT 33.2* 31.0* 31.9*  PLT 267 239 281   BMET  Recent Labs  12/04/14 1804 12/05/14 0325  NA 133* 133*  K 5.0 5.0  CL 95* 101  CO2 21* 18*   GLUCOSE 242* 231*  BUN 60* 59*  CREATININE 4.49* 4.15*  CALCIUM 10.7* 9.0   LFT  Recent Labs  12/04/14 2127 12/05/14 0325  PROT  --  5.8*  ALBUMIN  --  2.7*  AST  --  39  ALT  --  37  ALKPHOS  --  519*  BILITOT  --  1.4*  BILIDIR 0.4  --    Ammonia 12/04/2014 11,11/29/2014 74 Comprehensive metabolic panel 41/32/4401 total bili 1.1, alkaline phosphatase 872, ALT 70, AST 93.  PT/INR  Recent Labs  12/04/14 1804  LABPROT 15.5*  INR 1.21   Hepatitis Panel Blood work from 10/10/2010 hep C core total antibody negative, hep B surface antibody negative, hepatitis B surface antigen negative.    Studies/Results: Ct Abdomen Pelvis Wo Contrast  12/04/2014   CLINICAL DATA:  Nausea vomiting for 2 days.  EXAM: CT ABDOMEN AND PELVIS WITHOUT CONTRAST  TECHNIQUE: Multidetector CT imaging of the abdomen and pelvis was performed following the standard protocol without IV contrast.  COMPARISON:  November 27, 2014  FINDINGS: Patient status post prior cholecystectomy with intra and extrahepatic biliary ductal dilatation. The liver is otherwise normal. The spleen, pancreas are normal. There is hypertrophy of bilateral adrenal glands unchanged compared to prior exam. There is no nephrolithiasis or hydroureteronephrosis  bilaterally. There is mild scarring of both kidneys. There is atherosclerosis of the abdominal aorta without aneurysmal dilatation. Small lymph nodes are identified in the abdomen unchanged compared to prior exam.  There is no small bowel obstruction or diverticulitis. Patient is status post prior appendectomy. Nasogastric tube is identified with distal tip in the stomach.  The bladder is decompressed limiting evaluation. The uterus is normal. No focal pneumonia or pleural effusion is identified in the visualized lung bases. Degenerative joint changes of the spine are identified.  IMPRESSION: No acute abnormality identified in the abdomen and pelvis.  Status post prior cholecystectomy  with postsurgical intra and extrahepatic biliary ductal dilatation.   Electronically Signed   By: Abelardo Diesel M.D.   On: 12/04/2014 22:24   Dg Chest Port 1 View  12/04/2014   CLINICAL DATA:  Hypoxia. Nausea and vomiting. Increased somnolence for 2 days.  EXAM: PORTABLE CHEST 1 VIEW  COMPARISON:  Frontal and lateral views 11/29/2014  FINDINGS: Lower lung volumes compared to prior exam accentuating bronchovascular structures and cardiac size. Again seen elevation of right hemidiaphragm with adjacent compressive atelectasis. No confluent airspace disease, large pleural effusion, pneumothorax or pulmonary edema. The osseous structures are unchanged.  IMPRESSION: Lower lung volumes with right basilar atelectasis.   Electronically Signed   By: Jeb Levering M.D.   On: 12/04/2014 18:51    IMPRESSION/PLAN:   81 year old female admitted with nausea, vomiting, and coffee ground emesis. Patient has also been noted to have abnormal liver functions. CT of the abdomen and pelvis was negative for acute findings. H&H currently stable. Would continue PPI. Will obtain MRCP for further evaluation of ductal dilatation and abnormal LFTs.? If LFT abnormalities may in part be due to Keflex which can cause a cholestatic pattern or Neurontin. Patient was also noted to have an altered mental status which was likely due to her sepsis. Patient currently being treated for a UTI, but MRCP will help determine if biliary sepsis may be an etiology as well in light of her cholestatic  pattern and dilated biliary system. Nausea may be in part due to her declining kidney function as well. Trend LFTs. Trend CBC and ammonia. IV fluids and Zofran.    Hvozdovic, Deloris Ping 12/05/2014,  Pager [phone]  GI Attending Note   Chart was reviewed and patient was examined. X-rays and lab were reviewed.    I agree with management and plans.  Sandy Salaam. Deatra Ina, M.D., [facility] Cell [phone]

## 2014-12-05 NOTE — Care Management Note (Signed)
Case Management Note  Patient Details  Name: Kathryn [name] MRN: [id number] Date of Birth: 07/29/1933  Subjective/Objective:           sepsis         Action/Plan:Date:  Sept. 29, 2016 U.R. performed for needs and level of care. Will continue to follow for Case Management needs.  Velva Harman, RN, BSN, [place]   [phone]   Expected Discharge Date:   (unknown)               Expected Discharge Plan:  Skilled Nursing Facility  In-House Referral:  Clinical Social Work  Discharge planning Services  CM Consult  Post Acute Care Choice:  NA Choice offered to:  NA  DME Arranged:    DME Agency:     HH Arranged:    [place] Agency:     Status of Service:  In process, will continue to follow  Medicare Important Message Given:    Date Medicare IM Given:    Medicare IM give by:    Date Additional Medicare IM Given:    Additional Medicare Important Message give by:     If discussed at [place] of Stay Meetings, dates discussed:    Additional Comments:  [name], RN 12/05/2014, 10:56 AM

## 2014-12-05 NOTE — Progress Notes (Signed)
PT Cancellation Note  Patient Details Name: Kathryn [name] MRN: [id number] DOB: Jan 08, 1934   Cancelled Treatment:    Reason Eval/Treat Not Completed: PT screened, no needs identified, will sign off (patient is resident of snf, is bed bound, per SNF notes, does not get OOB. no acute PT needs.)   Claretha Cooper 12/05/2014, 9:20 AM Tresa Endo PT [phone]

## 2014-12-05 NOTE — Progress Notes (Signed)
Per Dr. Blaine Hamper, obtain manual blood pressures every 2 hours.

## 2014-12-05 NOTE — Consult Note (Signed)
GI Attending Note   Chart was reviewed and patient was examined. X-rays and lab were reviewed.  Pt's abnormal mental status is secondary to sepsis.  This could be due to a UTI although biliary sepsis must also be considered in view of cholestasis and dilated biliary tree.  UGI bleeding appears limited.  Would Rx with PPI and hold off endoscopic studies unless she develops active bleeding.  Proceed with MRCP.  Sandy Salaam. Deatra Ina, M.D., [facility] Cell [phone]

## 2014-12-05 NOTE — Progress Notes (Signed)
MD present at bedside. MD aware of patient's low blood pressure and lack of urine output. Orders received to give another liter bolus. RN will continue to monitor.

## 2014-12-05 NOTE — Consult Note (Signed)
WOC wound consult note Reason for Consult:patient with full thickness wound at coccyx, presentation is similar to that of a pilonidal cyst, not a pressure injury Wound type: Chronic, infectious Pressure Ulcer POA: No Measurement: 0.4cm round x 0.4cm deep Wound bed: Unable to see clearly into wound Drainage (amount, consistency, odor) scant amount of serosanguinous on end of cotton tipped applicator  Periwound: clear, dry. Macerated in inguinal areas from urinary incontinence Dressing procedure/placement/frequency: I will provide nursing with guidance on the care of the chronic full thickness injury using a calcium alginate dressing. I will top with a hydrocolloid dressing versus a foam due to the chronic urinary incontinence. Timely incontinence care is being provided using our house skin care products.  Side to side positioning except for meals with heel elevation using Prevalon boots is indicated. [place] nursing team will not follow, but will remain available to this patient, the nursing and medical team.  Please re-consult if needed. Thanks, Maudie Flakes, MSN, RN, [place], [place], [place] [phone])

## 2014-12-05 NOTE — Progress Notes (Signed)
OT Cancellation Note  Patient Details Name: Kathryn [name] MRN: [id number] DOB: [birth year]   Cancelled Treatment:    Reason Eval/Treat Not Completed: Other (comment) Per chart, pt from SNF and was bed bound. No skilled OT needs. Will sign off.  [place], [place] 12/05/2014, 11:21 AM

## 2014-12-05 NOTE — Progress Notes (Signed)
[facility] PROGRESS NOTE  Kathryn [name] KZS:[id number] DOB: [birth year] DOA: 12/04/2014 PCP: Blanchie Serve, MD  Assessment/Plan: Kathryn [name] is a 81 y.o. female with PMH of bedbound, , hypertension, diabetes mellitus, GERD, depression, atrial fibrillation not on anticoagulant (unclear reason), recurrent UTI, diastolic congestive heart failure, CKD-III, who admitted because for nausea, vomiting, constipation, acute encephalopathy.  Sepsis: Likely due  UTI. Patient has been treated with Keflex for positive urinalysis with Escherichia coli, which are resistant to lactamase. -Continue with  IV Vancomycin and Primaxin -follow up Bx and Ux -Continue with IV fluids.  -Lactic acid trending down.   UTI: -see above  Transaminases:  -GTT elevated, Alk phosphatase elevated  -Will check RUQ [place].  -GI consulted.   Acute encephalopathy: She is mildly confused, but oriented 3. Likely due to sepsis and UTI. -Treat UTI and sepsis as above -ammonia level at 11.  -will change oxycodone to PRN.  Nausea and vomiting: Etiology is not clear, likely due to UTI and sepsis. CT abdomen/pelvis is negative for acute abnormalities.  -IVF as above -prn Zofran for nausea, morphine for pain - lipase at 12.   AoCKD-III: Baseline Cr is 1.4 , her Cr is 4.49 on admission. Hemodynamic related, hypotension, infection, sepsis, also use of ACE.  - Hold lisinopril.  -Continue with IV fluids.  -cr at 4.1 today.   Possible UGI: Nurse noticed coffee-ground material from her vomited gastric materials.Differential : acute gastritis or stress ulcers. Patient does not have history of cirrhosis. Her hemoglobin is 12.6 on 11/29/14-->12.0.  - continue IV Protonix - Avoid NSAIDs and SQ heparin -follow Hb trend.  -GI consulted.   Atrial Fibrillation: CHA2DS2-VASc Score is 6, needs oral anticoagulation. Patient is not on AC at home (unclear reason), now presents with possible GIB, should not start anticoagulants.   -resume metoprolol, when BP allows it.    DM-II: Last A1c 5.8 on 06/04/14.  -SSI  HTN: -Hold amlodipine and lisinopril since patient blood pressure is a soft due to sepsis  Protein-calorie malnutrition: -Ensure when pt is able to eat.  Chronic diastolic CHF (congestive heart failure): compensated.  - BNP; 167 -hold metoprolol in setting of sepsis.   Decubitus ulcer-stage 3: -Wound care consulted.    Code Status: DNR Family Communication:  Disposition Plan: Remain in the step down unit.   Consultants:  GI  Procedures:  Abdominal US  Antibiotics:  Vancomycin  Imipenem/   HPI/Subjective: She is alert, oriented to place and situation.  She is feeling ok, she is answering questions. Denies abdominal pain.   Objective: Filed Vitals:   12/05/14 0534  BP: 98/60  Pulse: 90  Temp:   Resp: 15    Intake/Output Summary (Last 24 hours) at 12/05/14 0816 Last data filed at 12/05/14 0700  Gross per 24 hour  Intake 3337.5 ml  Output      0 ml  Net 3337.5 ml   Filed Weights   12/04/14 2230  Weight: 105.6 kg (232 lb 12.9 oz)    Exam:   General:  Alert in no distress. NG tube in place.   Cardiovascular: S 1, S 2 IRR  Respiratory: CTA  Abdomen: BS present, mildly distended.   Musculoskeletal: no edema  Data Reviewed: Basic Metabolic Panel:  Recent Labs Lab 11/29/14 1924 12/04/14 1804 12/05/14 0325  NA 140 133* 133*  K 4.1 5.0 5.0  CL 103 95* 101  CO2 26 21* 18*  GLUCOSE 158* 242* 231*  BUN 31* 60* 59*  CREATININE 1.46* 4.49*  4.15*  CALCIUM 10.6* 10.7* 9.0   Liver Function Tests:  Recent Labs Lab 11/29/14 1924 12/04/14 1804 12/05/14 0325  AST 93* 53* 39  ALT 70* 48 37  ALKPHOS 872* 665* 519*  BILITOT 1.1 1.4* 1.4*  PROT 7.2 7.0 5.8*  ALBUMIN 3.2* 3.2* 2.7*    Recent Labs Lab 12/05/14 0130  LIPASE 12*    Recent Labs Lab 11/29/14 1924 12/04/14 1805  AMMONIA 74* 11   CBC:  Recent Labs Lab 11/29/14 1924 12/04/14 1804  12/04/14 2127 12/05/14 0325  WBC 6.8 21.2* 16.5* 15.7*  NEUTROABS 4.3 20.2*  --   --   HGB 12.6 12.0 11.0* 10.2*  HCT 37.0 35.5* 33.2* 31.0*  MCV 101.1* 100.0 101.2* 101.3*  PLT 329 299 267 239   Cardiac Enzymes: No results for input(s): CKTOTAL, CKMB, CKMBINDEX, TROPONINI in the last 168 hours. BNP (last 3 results)  Recent Labs  12/05/14 0325  BNP 167.5*    ProBNP (last 3 results) No results for input(s): PROBNP in the last 8760 hours.  CBG:  Recent Labs Lab 12/05/14 0501  GLUCAP 226*    Recent Results (from the past 240 hour(s))  Urine culture     Status: None   Collection Time: 11/30/14 10:30 PM  Result Value Ref Range Status   Specimen Description URINE, CATHETERIZED  Final   Special Requests NONE  Final   Culture   Final    >=100,000 COLONIES/mL ESCHERICHIA COLI Confirmed Extended Spectrum Beta-Lactamase Producer (ESBL) Performed at [facility]    Report Status 12/04/2014 FINAL  Final   Organism ID, Bacteria ESCHERICHIA COLI  Final      Susceptibility   Escherichia coli - MIC*    AMPICILLIN >=32 RESISTANT Resistant     CEFAZOLIN >=64 RESISTANT Resistant     CEFTRIAXONE >=64 RESISTANT Resistant     CIPROFLOXACIN >=4 RESISTANT Resistant     GENTAMICIN >=16 RESISTANT Resistant     IMIPENEM <=0.25 SENSITIVE Sensitive     NITROFURANTOIN <=16 SENSITIVE Sensitive     TRIMETH/SULFA <=20 SENSITIVE Sensitive     AMPICILLIN/SULBACTAM >=32 RESISTANT Resistant     PIP/TAZO >=128 RESISTANT Resistant     * >=100,000 COLONIES/mL ESCHERICHIA COLI  MRSA PCR Screening     Status: Abnormal   Collection Time: 12/04/14 11:16 PM  Result Value Ref Range Status   MRSA by PCR POSITIVE (A) NEGATIVE Final    Comment:        The GeneXpert MRSA Assay (FDA approved for NASAL specimens only), is one component of a comprehensive MRSA colonization surveillance program. It is not intended to diagnose MRSA infection nor to guide or monitor treatment for MRSA  infections. RESULT CALLED TO, READ BACK BY AND VERIFIED WITH: NI,A/2W_0  ON 12/05/14 BY KARCZEWSKI,S.      Studies: Ct Abdomen Pelvis Wo Contrast  12/04/2014   CLINICAL DATA:  Nausea vomiting for 2 days.  EXAM: CT ABDOMEN AND PELVIS WITHOUT CONTRAST  TECHNIQUE: Multidetector CT imaging of the abdomen and pelvis was performed following the standard protocol without IV contrast.  COMPARISON:  November 27, 2014  FINDINGS: Patient status post prior cholecystectomy with intra and extrahepatic biliary ductal dilatation. The liver is otherwise normal. The spleen, pancreas are normal. There is hypertrophy of bilateral adrenal glands unchanged compared to prior exam. There is no nephrolithiasis or hydroureteronephrosis bilaterally. There is mild scarring of both kidneys. There is atherosclerosis of the abdominal aorta without aneurysmal dilatation. Small lymph nodes are identified in the abdomen unchanged compared  to prior exam.  There is no small bowel obstruction or diverticulitis. Patient is status post prior appendectomy. Nasogastric tube is identified with distal tip in the stomach.  The bladder is decompressed limiting evaluation. The uterus is normal. No focal pneumonia or pleural effusion is identified in the visualized lung bases. Degenerative joint changes of the spine are identified.  IMPRESSION: No acute abnormality identified in the abdomen and pelvis.  Status post prior cholecystectomy with postsurgical intra and extrahepatic biliary ductal dilatation.   Electronically Signed   By: Abelardo Diesel M.D.   On: 12/04/2014 22:24   Dg Chest Port 1 View  12/04/2014   CLINICAL DATA:  Hypoxia. Nausea and vomiting. Increased somnolence for 2 days.  EXAM: PORTABLE CHEST 1 VIEW  COMPARISON:  Frontal and lateral views 11/29/2014  FINDINGS: Lower lung volumes compared to prior exam accentuating bronchovascular structures and cardiac size. Again seen elevation of right hemidiaphragm with adjacent compressive  atelectasis. No confluent airspace disease, large pleural effusion, pneumothorax or pulmonary edema. The osseous structures are unchanged.  IMPRESSION: Lower lung volumes with right basilar atelectasis.   Electronically Signed   By: Jeb Levering M.D.   On: 12/04/2014 18:51    Scheduled Meds: . beta carotene w/minerals  1 tablet Oral BID  . calcium-vitamin D  1 tablet Oral BID  . Chlorhexidine Gluconate Cloth  6 each Topical Q0600  . cholecalciferol  400 Units Oral Daily  . feeding supplement (ENSURE ENLIVE)  237 mL Oral BID BM  . gabapentin  300 mg Oral QHS  . imipenem-cilastatin  250 mg Intravenous Q12H  . insulin aspart  0-9 Units Subcutaneous TID WC  . metoprolol  50 mg Oral BID  . multivitamin with minerals  1 tablet Oral Daily  . mupirocin ointment  1 application Nasal BID  . oxyCODONE  10 mg Oral Daily  . polyethylene glycol  17 g Oral Daily  . sodium chloride  1,000 mL Intravenous Once  . sodium chloride  3 mL Intravenous Q12H   Continuous Infusions: . sodium chloride 75 mL/hr at 12/05/14 0100  . pantoprozole (PROTONIX) infusion 8 mg/hr (12/05/14 0542)    Principal Problem:   Sepsis Active Problems:   Macular degeneration (senile) of retina   Essential hypertension   Atrial fibrillation   Osteoporosis   GERD (gastroesophageal reflux disease)   DM (diabetes mellitus), type 2 with renal complications   Acute renal failure superimposed on stage 3 chronic kidney disease   Protein-calorie malnutrition   Pressure ulcer, stage 3   Type 2 diabetes mellitus with diabetic polyneuropathy   Constipation, chronic   Major depression, chronic   Chronic diastolic CHF (congestive heart failure)   UTI (lower urinary tract infection)   UGI bleed   Nausea & vomiting   Acute encephalopathy   Decubitus ulcer of ankle, stage 3    Time spent: 35 minutes.     Niel Hummer A  [facility] Pager [phone]. If 7PM-7AM, please contact night-coverage at [email or web address],  password [facility] 12/05/2014, 8:16 AM  LOS: 1 day

## 2014-12-06 ENCOUNTER — Inpatient Hospital Stay (HOSPITAL_COMMUNITY): Payer: Medicare Other

## 2014-12-06 ENCOUNTER — Encounter: Payer: Self-pay | Admitting: Nurse Practitioner

## 2014-12-06 LAB — GLUCOSE, CAPILLARY
GLUCOSE-CAPILLARY: 180 mg/dL — AB (ref 65–99)
Glucose-Capillary: 159 mg/dL — ABNORMAL HIGH (ref 65–99)
Glucose-Capillary: 189 mg/dL — ABNORMAL HIGH (ref 65–99)
Glucose-Capillary: 211 mg/dL — ABNORMAL HIGH (ref 65–99)

## 2014-12-06 LAB — BASIC METABOLIC PANEL
ANION GAP: 13 (ref 5–15)
BUN: 61 mg/dL — ABNORMAL HIGH (ref 6–20)
CHLORIDE: 105 mmol/L (ref 101–111)
CO2: 16 mmol/L — AB (ref 22–32)
CREATININE: 3.82 mg/dL — AB (ref 0.44–1.00)
Calcium: 8.6 mg/dL — ABNORMAL LOW (ref 8.9–10.3)
GFR calc non Af Amer: 10 mL/min — ABNORMAL LOW (ref >60)
GFR, EST AFRICAN AMERICAN: 12 mL/min — AB (ref >60)
Glucose, Bld: 148 mg/dL — ABNORMAL HIGH (ref 65–99)
Potassium: 4 mmol/L (ref 3.5–5.1)
Sodium: 134 mmol/L — ABNORMAL LOW (ref 135–145)

## 2014-12-06 LAB — CBC
HEMATOCRIT: 31.3 % — AB (ref 36.0–46.0)
HEMOGLOBIN: 10.5 g/dL — AB (ref 12.0–15.0)
MCH: 33.5 pg (ref 26.0–34.0)
MCHC: 33.5 g/dL (ref 30.0–36.0)
MCV: 100 fL (ref 78.0–100.0)
Platelets: 251 10*3/uL (ref 150–400)
RBC: 3.13 MIL/uL — ABNORMAL LOW (ref 3.87–5.11)
RDW: 13.1 % (ref 11.5–15.5)
WBC: 17.3 10*3/uL — ABNORMAL HIGH (ref 4.0–10.5)

## 2014-12-06 LAB — HEPATIC FUNCTION PANEL
ALBUMIN: 2.4 g/dL — AB (ref 3.5–5.0)
ALT: 29 U/L (ref 14–54)
AST: 29 U/L (ref 15–41)
Alkaline Phosphatase: 414 U/L — ABNORMAL HIGH (ref 38–126)
BILIRUBIN TOTAL: 1.1 mg/dL (ref 0.3–1.2)
Bilirubin, Direct: 0.3 mg/dL (ref 0.1–0.5)
Indirect Bilirubin: 0.8 mg/dL (ref 0.3–0.9)
TOTAL PROTEIN: 5.6 g/dL — AB (ref 6.5–8.1)

## 2014-12-06 LAB — GAMMA GT: GGT: 451 U/L — ABNORMAL HIGH (ref 7–50)

## 2014-12-06 LAB — LIPASE, BLOOD: LIPASE: 18 U/L — AB (ref 22–51)

## 2014-12-06 MED ORDER — METOPROLOL TARTRATE 25 MG PO TABS
25.0000 mg | ORAL_TABLET | Freq: Two times a day (BID) | ORAL | Status: DC
  Administered 2014-12-06 – 2014-12-08 (×5): 25 mg via ORAL
  Filled 2014-12-06 (×5): qty 1

## 2014-12-06 MED ORDER — STERILE WATER FOR INJECTION IV SOLN
INTRAVENOUS | Status: DC
  Administered 2014-12-06 – 2014-12-07 (×2): via INTRAVENOUS
  Filled 2014-12-06 (×4): qty 850

## 2014-12-06 MED ORDER — LACTULOSE 10 GM/15ML PO SOLN
30.0000 g | Freq: Three times a day (TID) | ORAL | Status: DC
  Administered 2014-12-06 – 2014-12-07 (×5): 30 g via ORAL
  Filled 2014-12-06 (×5): qty 45

## 2014-12-06 NOTE — Progress Notes (Signed)
ANTIBIOTIC CONSULT NOTE - FOLLOW UP  Pharmacy Consult for Vancomycin and Primaxin Indication: ESBL UTI, sepsis  Allergies  Allergen Reactions  . Celebrex [Celecoxib]   . Nsaids   . Sulfa Antibiotics     Patient Measurements: Height: 5\' 10"  (177.8 cm) Weight: 232 lb 12.9 oz (105.6 kg) IBW/kg (Calculated) : 68.5  Vital Signs: Temp: 97.2 F (36.2 C) (09/30 0400) Temp Source: Axillary (09/30 0400) BP: 130/52 mmHg (09/30 0800) Pulse Rate: 77 (09/30 0800) Intake/Output from previous day: 09/29 0701 - 09/30 0700 In: 1700 [I.V.:1500; IV Piggyback:200] Out: -  Intake/Output from this shift:    Labs:  Recent Labs  12/04/14 1804  12/05/14 0325 12/05/14 0937 12/06/14 0434  WBC 21.2*  < > 15.7* 14.6* 17.3*  HGB 12.0  < > 10.2* 10.5* 10.5*  PLT 299  < > 239 281 251  CREATININE 4.49*  --  4.15*  --  3.82*  < > = values in this interval not displayed. Estimated Creatinine Clearance: 15.7 mL/min (by C-G formula based on Cr of 3.82). No results for input(s): VANCOTROUGH, VANCOPEAK, VANCORANDOM, GENTTROUGH, GENTPEAK, GENTRANDOM, TOBRATROUGH, TOBRAPEAK, TOBRARND, AMIKACINPEAK, AMIKACINTROU, AMIKACIN in the last 72 hours.   Microbiology: Recent Results (from the past 720 hour(s))  Urine culture     Status: None   Collection Time: 11/30/14 10:30 PM  Result Value Ref Range Status   Specimen Description URINE, CATHETERIZED  Final   Special Requests NONE  Final   Culture   Final    >=100,000 COLONIES/mL ESCHERICHIA COLI Confirmed Extended Spectrum Beta-Lactamase Producer (ESBL) Performed at [facility]    Report Status 12/04/2014 FINAL  Final   Organism ID, Bacteria ESCHERICHIA COLI  Final      Susceptibility   Escherichia coli - MIC*    AMPICILLIN >=32 RESISTANT Resistant     CEFAZOLIN >=64 RESISTANT Resistant     CEFTRIAXONE >=64 RESISTANT Resistant     CIPROFLOXACIN >=4 RESISTANT Resistant     GENTAMICIN >=16 RESISTANT Resistant     IMIPENEM <=0.25 SENSITIVE  Sensitive     NITROFURANTOIN <=16 SENSITIVE Sensitive     TRIMETH/SULFA <=20 SENSITIVE Sensitive     AMPICILLIN/SULBACTAM >=32 RESISTANT Resistant     PIP/TAZO >=128 RESISTANT Resistant     * >=100,000 COLONIES/mL ESCHERICHIA COLI  Blood culture (routine x 2)     Status: None (Preliminary result)   Collection Time: 12/04/14  6:00 PM  Result Value Ref Range Status   Specimen Description BLOOD LEFT HAND  Final   Special Requests BOTTLES DRAWN AEROBIC AND ANAEROBIC 5CC EACH  Final   Culture   Final    NO GROWTH < 24 HOURS Performed at [facility]    Report Status PENDING  Incomplete  Blood culture (routine x 2)     Status: None (Preliminary result)   Collection Time: 12/04/14  6:00 PM  Result Value Ref Range Status   Specimen Description BLOOD RIGHT HAND  Final   Special Requests BOTTLES DRAWN AEROBIC AND ANAEROBIC 5CC EACH  Final   Culture   Final    NO GROWTH < 24 HOURS Performed at [facility]    Report Status PENDING  Incomplete  MRSA PCR Screening     Status: Abnormal   Collection Time: 12/04/14 11:16 PM  Result Value Ref Range Status   MRSA by PCR POSITIVE (A) NEGATIVE Final    Comment:        The GeneXpert MRSA Assay (FDA approved for NASAL specimens only), is  one component of a comprehensive MRSA colonization surveillance program. It is not intended to diagnose MRSA infection nor to guide or monitor treatment for MRSA infections. RESULT CALLED TO, READ BACK BY AND VERIFIED WITH: NI,A/2W@0315  ON 12/05/14 BY KARCZEWSKI,S.     Anti-infectives    Start     Dose/Rate Route Frequency Ordered Stop   12/05/14 0900  imipenem-cilastatin (PRIMAXIN) 250 mg in sodium chloride 0.9 % 100 mL IVPB     250 mg 200 mL/hr over 30 Minutes Intravenous Every 12 hours 12/04/14 2109     12/04/14 2200  imipenem-cilastatin (PRIMAXIN) 500 mg in sodium chloride 0.9 % 100 mL IVPB  Status:  Discontinued     500 mg 200 mL/hr over 30 Minutes Intravenous 3 times per day 12/04/14  2049 12/04/14 2053   12/04/14 2115  vancomycin (VANCOCIN) 2,000 mg in sodium chloride 0.9 % 500 mL IVPB     2,000 mg 250 mL/hr over 120 Minutes Intravenous NOW 12/04/14 2109 12/05/14 0113   12/04/14 2115  imipenem-cilastatin (PRIMAXIN) 250 mg in sodium chloride 0.9 % 100 mL IVPB     250 mg 200 mL/hr over 30 Minutes Intravenous NOW 12/04/14 2109 12/04/14 2221      Assessment: Patient's a 81 y.o F with ESBL UTI from ucx on 9/24 and was treated with keflex PTA. She was referred to the ED on 9/28 by her PCP for further workup of elevated LFTs and ammonia level. In the ED, she was found to have elevated WBC and LA. To start abx for suspected sepsis secondary to UTI.  9/28 >> vancomycin 2g x 1 at 23:13 >> 9/28 >> primaxin 250mg  q12h >>  9/28 blood x2: 9/28 urine: 9/24 ucx: >100K ESBL Ecoli (S= primaxin, nitrof, bactrim)  Today, 12/06/2014 Remains afebrile WBC elevated, 17.3k AoCRF (baseline SCr 1.4), SCr slowly improving, CrCl 12 ml/min/1.34m2 (normalized)  Goal of Therapy:  Vancomycin trough level 15-20 mcg/ml  Primaxin dose per weight and renal function  Plan:   Check random vancomycin level at 23:00 tonight (48hr post dose) and re-dose if > 20 mcg/ml  Continue Primaxin 250mg  IV q12h Follow up renal function & cultures, de-escalate as appropriate   Peggyann Juba, PharmD, BCPS Pager: [phone] 12/06/2014,9:01 AM

## 2014-12-06 NOTE — Clinical Social Work Note (Signed)
Clinical Social Work Assessment  Patient Details  Name: Kathryn [name] MRN: [id number] Date of Birth: 29-Sep-1933  Date of referral:  12/06/14               Reason for consult:  Facility Placement, Discharge Planning                Permission sought to share information with:  Facility Art therapist granted to share information::  Yes, Verbal Permission Granted  Name::        Agency::     Relationship::     Contact Information:     Housing/Transportation Living arrangements for the past 2 months:  [place] of Information:  Patient Patient Interpreter Needed:  None Criminal Activity/Legal Involvement Pertinent to Current Situation/Hospitalization:  No - Comment as needed Significant Relationships:  Adult Children, Spouse Lives with:  Facility Resident Do you feel safe going back to the place where you live?  Yes Need for family participation in patient care:  Yes (Comment)  Care giving concerns: No family at bedside. No concerns reported by pt.   Social Worker assessment / plan:  Pt hospitalized on 12/04/14 with sepsis. Pt is a long term care resident from [facility]. CSW met with pt to assist with d/c planning. Pt plans to return to [facility] at d/c. SNF contacted and clinicals sent for review. CSW is waiting for return call to confirm d/c plan. CSW will continue to follow to assist with d/c planning to SNF.  Employment status:  Retired Forensic scientist:  Medicaid In [place], [employer] PT Recommendations:  Not assessed at this time [place] / Referral to community resources:     Patient/Family's Response to care:  Pt has been a resident of [facility] for 2 yrs. Spouse also resides at [facility]. Pt plans to return to [facility] when she is stable for d/c.  Patient/Family's Understanding of and Emotional Response to Diagnosis, Current Treatment, and Prognosis: No family at bedside. Pt is feeling poorly. She is looking  forward to feeling better and returning to SNF to be with her spouse. Support provided.  Emotional Assessment Appearance:  Appears stated age Attitude/Demeanor/Rapport:  Other (cooperative) Affect (typically observed):  Calm, Appropriate Orientation:  Oriented to Self, Oriented to Place, Oriented to Situation Alcohol / Substance use:  Not Applicable Psych involvement (Current and /or in the community):  No (Comment)  Discharge Needs  Concerns to be addressed:  Discharge Planning Concerns Readmission within the last 30 days:  No Current discharge risk:  None Barriers to Discharge:  No Barriers Identified   Luretha Rued, [place] 12/06/2014, 2:52 PM

## 2014-12-06 NOTE — Progress Notes (Signed)
[facility] Progress Note  Subjective:   Alk phos 414. Still with some epigastric pain. On primaxin.   Objective:  Vital signs in last 24 hours: Temp:  [97.1 F (36.2 C)-97.8 F (36.6 C)] 97.2 F (36.2 C) (09/30 0400) Pulse Rate:  [71-94] 77 (09/30 0800) Resp:  [14-26] 18 (09/30 0800) BP: (94-156)/(36-73) 130/52 mmHg (09/30 0800) SpO2:  [100 %] 100 % (09/30 0800)   General:   Alert,  Well-developed,  in NAD Heart:  Regular rate and rhythm; no murmurs Pulm;lungs clear Abdomen:  Soft, mild epigastric tenderness to palpation Normal bowel sounds, without guarding, and without rebound.   Extremities:  Without edema. Neurologic:  Alert and  oriented x4;  grossly normal neurologically. Psych: Alert and cooperative. Normal mood and affect.  Intake/Output from previous day: 09/29 0701 - 09/30 0700 In: 1703 [I.V.:1503; IV Piggyback:200] Out: -  Intake/Output this shift: Total I/O In: 367.5 [I.V.:367.5] Out: -   Lab Results:  Recent Labs  12/05/14 0325 12/05/14 0937 12/06/14 0434  WBC 15.7* 14.6* 17.3*  HGB 10.2* 10.5* 10.5*  HCT 31.0* 31.9* 31.3*  PLT 239 281 251   BMET  Recent Labs  12/04/14 1804 12/05/14 0325 12/06/14 0434  NA 133* 133* 134*  K 5.0 5.0 4.0  CL 95* 101 105  CO2 21* 18* 16*  GLUCOSE 242* 231* 148*  BUN 60* 59* 61*  CREATININE 4.49* 4.15* 3.82*  CALCIUM 10.7* 9.0 8.6*   LFT  Recent Labs  12/06/14 0840  PROT 5.6*  ALBUMIN 2.4*  AST 29  ALT 29  ALKPHOS 414*  BILITOT 1.1  BILIDIR 0.3  IBILI 0.8   PT/INR  Recent Labs  12/04/14 1804  LABPROT 15.5*  INR 1.21      Ct Abdomen Pelvis Wo Contrast  12/04/2014   CLINICAL DATA:  Nausea vomiting for 2 days.  EXAM: CT ABDOMEN AND PELVIS WITHOUT CONTRAST  TECHNIQUE: Multidetector CT imaging of the abdomen and pelvis was performed following the standard protocol without IV contrast.  COMPARISON:  November 27, 2014  FINDINGS: Patient status post prior cholecystectomy  with intra and extrahepatic biliary ductal dilatation. The liver is otherwise normal. The spleen, pancreas are normal. There is hypertrophy of bilateral adrenal glands unchanged compared to prior exam. There is no nephrolithiasis or hydroureteronephrosis bilaterally. There is mild scarring of both kidneys. There is atherosclerosis of the abdominal aorta without aneurysmal dilatation. Small lymph nodes are identified in the abdomen unchanged compared to prior exam.  There is no small bowel obstruction or diverticulitis. Patient is status post prior appendectomy. Nasogastric tube is identified with distal tip in the stomach.  The bladder is decompressed limiting evaluation. The uterus is normal. No focal pneumonia or pleural effusion is identified in the visualized lung bases. Degenerative joint changes of the spine are identified.  IMPRESSION: No acute abnormality identified in the abdomen and pelvis.  Status post prior cholecystectomy with postsurgical intra and extrahepatic biliary ductal dilatation.   Electronically Signed   By: Abelardo Diesel M.D.   On: 12/04/2014 22:24   US Abdomen Complete  12/05/2014   CLINICAL DATA:  Elevated transaminase levels. Acute renal failure. Previous cholecystectomy. Initial encounter.  EXAM: ULTRASOUND ABDOMEN COMPLETE  COMPARISON:  CT 12/04/2014.  FINDINGS: Gallbladder: Status post cholecystectomy  Common bile duct: Diameter: 3 mm.  Liver: The liver is small with contour irregularity and diffusely heterogeneous echotexture. No focal lesion observed.  IVC: No abnormality visualized.  Pancreas: Fatty replaced and atrophy.  No focal  abnormality.  Spleen: Largely obscured by bowel gas and poorly visualized.  Right Kidney: Length: 9.7 cm. There is cortical thinning and lobularity, but no hydronephrosis or suspicious lesion.  Left Kidney: Length: 11.5 cm. Mild cortical thinning. No hydronephrosis.  Abdominal aorta: Suboptimally visualized. Atherosclerosis without evidence of aneurysm.   Other findings: None.  IMPRESSION: 1. No acute findings. No biliary dilatation status post cholecystectomy. 2. Examination is limited by body habitus and bowel gas. The parenchymal organs were better visualized on CT done yesterday.   Electronically Signed   By: Richardean Sale M.D.   On: 12/05/2014 20:54   Mr Abdomen Mrcp Wo Cm  12/06/2014   CLINICAL DATA:  Biliary dilatation status post cholecystectomy. Possible biliary sepsis. Acute encephalopathy and renal insufficiency. Initial encounter.  EXAM: MRI ABDOMEN WITHOUT CONTRAST  (INCLUDING MRCP)  TECHNIQUE: Multiplanar multisequence MR imaging of the abdomen was performed. Heavily T2-weighted images of the biliary and pancreatic ducts were obtained, and three-dimensional MRCP images were rendered by post processing.  COMPARISON:  CTs 04/25/2011, 11/27/2014 and 12/04/2014.  FINDINGS: Study is moderately motion degraded due to the patient's inability to suspend respiration.  Lower chest: There are small bilateral pleural effusions and associated bibasilar atelectasis which appear mildly worse.  Hepatobiliary: No focal hepatic abnormalities are identified. There is mild intra and extrahepatic biliary dilatation. The common hepatic duct measures up to 13 mm in diameter. There are small dependent filling defects within the distal common bile duct which appear somewhat linear on the reformatted images, favored to reflect several small intraductal stones, best seen on series 5.  Pancreas: Atrophy. No evidence of pancreatic mass, pancreatic ductal dilatation or surrounding inflammation.  Spleen: Normal in size without focal abnormality.  Adrenals/Urinary Tract: Stable mild left adrenal hyperplasia. Both kidneys demonstrate cortical thinning, but no hydronephrosis or suspicious focal abnormality.  Stomach/Bowel: Mild distension of the colon with fluid and air appears unchanged. No bowel wall thickening or focal inflammatory change identified.  Vascular/Lymphatic: There  are no enlarged abdominal lymph nodes. Diffuse atherosclerosis better demonstrated by CT.  Other: There is diffuse muscular atrophy. There is mild generalized soft tissue edema with asymmetric flank edema on the right. Calcified breast lesion appears unchanged.  Musculoskeletal: No acute or significant osseous findings.  IMPRESSION: 1. Mild biliary dilatation status post cholecystectomy. Small filling defects within the distal common bile duct are suspicious for choledocholithiasis. 2. No evidence of pancreatic mass or pancreatic ductal dilatation. 3. Enlarging small bilateral pleural effusions with diffuse soft tissue edema suspicious for anasarca. 4. Stable renal cortical thinning and left adrenal hyperplasia.   Electronically Signed   By: Richardean Sale M.D.   On: 12/06/2014 10:42   Mr 3d Recon At Scanner  12/06/2014   CLINICAL DATA:  Biliary dilatation status post cholecystectomy. Possible biliary sepsis. Acute encephalopathy and renal insufficiency. Initial encounter.  EXAM: MRI ABDOMEN WITHOUT CONTRAST  (INCLUDING MRCP)  TECHNIQUE: Multiplanar multisequence MR imaging of the abdomen was performed. Heavily T2-weighted images of the biliary and pancreatic ducts were obtained, and three-dimensional MRCP images were rendered by post processing.  COMPARISON:  CTs 04/25/2011, 11/27/2014 and 12/04/2014.  FINDINGS: Study is moderately motion degraded due to the patient's inability to suspend respiration.  Lower chest: There are small bilateral pleural effusions and associated bibasilar atelectasis which appear mildly worse.  Hepatobiliary: No focal hepatic abnormalities are identified. There is mild intra and extrahepatic biliary dilatation. The common hepatic duct measures up to 13 mm in diameter. There are small dependent filling defects within the  distal common bile duct which appear somewhat linear on the reformatted images, favored to reflect several small intraductal stones, best seen on series 5.  Pancreas:  Atrophy. No evidence of pancreatic mass, pancreatic ductal dilatation or surrounding inflammation.  Spleen: Normal in size without focal abnormality.  Adrenals/Urinary Tract: Stable mild left adrenal hyperplasia. Both kidneys demonstrate cortical thinning, but no hydronephrosis or suspicious focal abnormality.  Stomach/Bowel: Mild distension of the colon with fluid and air appears unchanged. No bowel wall thickening or focal inflammatory change identified.  Vascular/Lymphatic: There are no enlarged abdominal lymph nodes. Diffuse atherosclerosis better demonstrated by CT.  Other: There is diffuse muscular atrophy. There is mild generalized soft tissue edema with asymmetric flank edema on the right. Calcified breast lesion appears unchanged.  Musculoskeletal: No acute or significant osseous findings.  IMPRESSION: 1. Mild biliary dilatation status post cholecystectomy. Small filling defects within the distal common bile duct are suspicious for choledocholithiasis. 2. No evidence of pancreatic mass or pancreatic ductal dilatation. 3. Enlarging small bilateral pleural effusions with diffuse soft tissue edema suspicious for anasarca. 4. Stable renal cortical thinning and left adrenal hyperplasia.   Electronically Signed   By: Richardean Sale M.D.   On: 12/06/2014 10:42   Dg Chest Port 1 View  12/04/2014   CLINICAL DATA:  Hypoxia. Nausea and vomiting. Increased somnolence for 2 days.  EXAM: PORTABLE CHEST 1 VIEW  COMPARISON:  Frontal and lateral views 11/29/2014  FINDINGS: Lower lung volumes compared to prior exam accentuating bronchovascular structures and cardiac size. Again seen elevation of right hemidiaphragm with adjacent compressive atelectasis. No confluent airspace disease, large pleural effusion, pneumothorax or pulmonary edema. The osseous structures are unchanged.  IMPRESSION: Lower lung volumes with right basilar atelectasis.   Electronically Signed   By: Jeb Levering M.D.   On: 12/04/2014 18:51     ASSESSMENT/PLAN:   81 year old female admitted with nausea, vomiting, and coffee ground emesis. Patient has also been noted to have abnormal liver functions. CT of the abdomen and pelvis was negative for acute findings.MRCP today with filling defects CBD suspicious for choledocholithiasis. Will plan on ERCP tomorrow.The risks, benefits, and possible complications of the procedure, including  But not limited to bleeding, perforation, surgery, and the 5-10% risk for pancreatitis, were explained to the patient.  It was explained that  post ERCP pancreatitis which, while usually is mild, occasionally maybe severe and even life-threatening.  Patient's questions were answered.     LOS: 2 days   Hvozdovic, Vita Barley PA-C 12/06/2014, Pager [phone]

## 2014-12-06 NOTE — Progress Notes (Signed)
[facility] PROGRESS NOTE  Kathryn [name] IOE:[id number] DOB: 19-Oct-1933 DOA: 12/04/2014 PCP: Blanchie Serve, MD  Assessment/Plan: Kathryn [name] is a 81 y.o. female with PMH of bedbound, , hypertension, diabetes mellitus, GERD, depression, atrial fibrillation not on anticoagulant (unclear reason), recurrent UTI, diastolic congestive heart failure, CKD-III, who admitted because for nausea, vomiting, constipation, acute encephalopathy.  Sepsis: Likely due  UTI. Patient has been treated with Keflex for positive urinalysis with Escherichia coli, which are resistant to lactamase. -Continue with  IV Vancomycin and Primaxin -follow up Bx and Ux -Continue with IV fluids.  -Lactic acid trending down.   UTI: -E coli from culture 9-24, multiresistant, sensitive to imipenem.   Transaminases:  -GTT elevated, Alk phosphatase elevated  -RUQ [place]: No acute findings. No biliary dilatation status post cholecystectomy. -GI consulted. Plan for MRCP today.   Acute encephalopathy: She is mildly confused, but oriented 3. Likely due to sepsis and UTI. -Treat UTI and sepsis as above -ammonia level at 11.  -will change oxycodone to PRN. -increase lactulose.   Nausea and vomiting: Etiology is not clear, likely due to UTI and sepsis. CT abdomen/pelvis is negative for acute abnormalities.  -IVF as above -prn Zofran for nausea, morphine for pain - lipase at 12. Remove NG tube today if ok by GI.   AoCKD-III: Baseline Cr is 1.4 , her Cr is 4.49 on admission. Hemodynamic related, hypotension, infection, sepsis, also use of ACE.  - Hold lisinopril.  -Continue with IV fluids. Change fluid to bicarb Gtt due to acidosis.  -cr trending down.  -per family patient has chronic foley catheter. Urology consulted for evaluation.   Possible UGI: Nurse noticed coffee-ground material from her vomited gastric materials.Differential : acute gastritis or stress ulcers. Patient does not have history of cirrhosis. Her  hemoglobin is 12.6 on 11/29/14-->12.0.  - continue IV Protonix - Avoid NSAIDs and SQ heparin -Hb stable.  -GI consulted. No need for endoscopy.   Atrial Fibrillation: CHA2DS2-VASc Score is 6, needs oral anticoagulation. Patient is not on AC at home (unclear reason), now presents with possible GIB, should not start anticoagulants.  -resume metoprolol, lower dose.   DM-II: Last A1c 5.8 on 06/04/14.  -SSI  HTN: -Hold amlodipine and lisinopril since patient blood pressure is a soft due to sepsis  Protein-calorie malnutrition: -Ensure when pt is able to eat.  Chronic diastolic CHF (congestive heart failure): compensated.  - BNP; 167 -resume metoprolol lower dose.   Decubitus ulcer-stage 3: -Wound care consulted.    Code Status: DNR Family Communication: care discussed with son 9-29 Disposition Plan: Remain in the step down unit.   Consultants:  GI  Procedures:  Abdominal US  Antibiotics:  Vancomycin  Imipenem/   HPI/Subjective: She is alert, oriented to place and situation.  Wants to eat and drink. No BM reported.  Patient had 3 incontinence episode of urine yesterday.   Objective: Filed Vitals:   12/06/14 0800  BP: 130/52  Pulse: 77  Temp:   Resp: 18    Intake/Output Summary (Last 24 hours) at 12/06/14 0814 Last data filed at 12/06/14 0400  Gross per 24 hour  Intake   1700 ml  Output      0 ml  Net   1700 ml   Filed Weights   12/04/14 2230  Weight: 105.6 kg (232 lb 12.9 oz)    Exam:   General:  Alert in no distress. NG tube in place.   Cardiovascular: S 1, S 2 IRR  Respiratory: CTA  Abdomen: BS present, mildly distended.   Musculoskeletal: no edema  Data Reviewed: Basic Metabolic Panel:  Recent Labs Lab 11/29/14 1924 12/04/14 1804 12/05/14 0325 12/06/14 0434  NA 140 133* 133* 134*  K 4.1 5.0 5.0 4.0  CL 103 95* 101 105  CO2 26 21* 18* 16*  GLUCOSE 158* 242* 231* 148*  BUN 31* 60* 59* 61*  CREATININE 1.46* 4.49* 4.15* 3.82*   CALCIUM 10.6* 10.7* 9.0 8.6*   Liver Function Tests:  Recent Labs Lab 11/29/14 1924 12/04/14 1804 12/05/14 0325  AST 93* 53* 39  ALT 70* 48 37  ALKPHOS 872* 665* 519*  BILITOT 1.1 1.4* 1.4*  PROT 7.2 7.0 5.8*  ALBUMIN 3.2* 3.2* 2.7*    Recent Labs Lab 12/05/14 0130  LIPASE 12*    Recent Labs Lab 11/29/14 1924 12/04/14 1805  AMMONIA 74* 11   CBC:  Recent Labs Lab 11/29/14 1924 12/04/14 1804 12/04/14 2127 12/05/14 0325 12/05/14 0937 12/06/14 0434  WBC 6.8 21.2* 16.5* 15.7* 14.6* 17.3*  NEUTROABS 4.3 20.2*  --   --   --   --   HGB 12.6 12.0 11.0* 10.2* 10.5* 10.5*  HCT 37.0 35.5* 33.2* 31.0* 31.9* 31.3*  MCV 101.1* 100.0 101.2* 101.3* 103.2* 100.0  PLT 329 299 267 239 281 251   Cardiac Enzymes: No results for input(s): CKTOTAL, CKMB, CKMBINDEX, TROPONINI in the last 168 hours. BNP (last 3 results)  Recent Labs  12/05/14 0325  BNP 167.5*    ProBNP (last 3 results) No results for input(s): PROBNP in the last 8760 hours.  CBG:  Recent Labs Lab 12/05/14 0501 12/05/14 0822 12/05/14 1227 12/05/14 1636 12/05/14 2208  GLUCAP 226* 207* 215* 194* 134*    Recent Results (from the past 240 hour(s))  Urine culture     Status: None   Collection Time: 11/30/14 10:30 PM  Result Value Ref Range Status   Specimen Description URINE, CATHETERIZED  Final   Special Requests NONE  Final   Culture   Final    >=100,000 COLONIES/mL ESCHERICHIA COLI Confirmed Extended Spectrum Beta-Lactamase Producer (ESBL) Performed at [facility]    Report Status 12/04/2014 FINAL  Final   Organism ID, Bacteria ESCHERICHIA COLI  Final      Susceptibility   Escherichia coli - MIC*    AMPICILLIN >=32 RESISTANT Resistant     CEFAZOLIN >=64 RESISTANT Resistant     CEFTRIAXONE >=64 RESISTANT Resistant     CIPROFLOXACIN >=4 RESISTANT Resistant     GENTAMICIN >=16 RESISTANT Resistant     IMIPENEM <=0.25 SENSITIVE Sensitive     NITROFURANTOIN <=16 SENSITIVE Sensitive      TRIMETH/SULFA <=20 SENSITIVE Sensitive     AMPICILLIN/SULBACTAM >=32 RESISTANT Resistant     PIP/TAZO >=128 RESISTANT Resistant     * >=100,000 COLONIES/mL ESCHERICHIA COLI  Blood culture (routine x 2)     Status: None (Preliminary result)   Collection Time: 12/04/14  6:00 PM  Result Value Ref Range Status   Specimen Description BLOOD LEFT HAND  Final   Special Requests BOTTLES DRAWN AEROBIC AND ANAEROBIC 5CC EACH  Final   Culture   Final    NO GROWTH < 24 HOURS Performed at [facility]    Report Status PENDING  Incomplete  Blood culture (routine x 2)     Status: None (Preliminary result)   Collection Time: 12/04/14  6:00 PM  Result Value Ref Range Status   Specimen Description BLOOD RIGHT HAND  Final   Special Requests  BOTTLES DRAWN AEROBIC AND ANAEROBIC 5CC EACH  Final   Culture   Final    NO GROWTH < 24 HOURS Performed at [facility]    Report Status PENDING  Incomplete  MRSA PCR Screening     Status: Abnormal   Collection Time: 12/04/14 11:16 PM  Result Value Ref Range Status   MRSA by PCR POSITIVE (A) NEGATIVE Final    Comment:        The GeneXpert MRSA Assay (FDA approved for NASAL specimens only), is one component of a comprehensive MRSA colonization surveillance program. It is not intended to diagnose MRSA infection nor to guide or monitor treatment for MRSA infections. RESULT CALLED TO, READ BACK BY AND VERIFIED WITH: NI,[id number]  ON 12/05/14 BY KARCZEWSKI,S.      Studies: Ct Abdomen Pelvis Wo Contrast  12/04/2014   CLINICAL DATA:  Nausea vomiting for 2 days.  EXAM: CT ABDOMEN AND PELVIS WITHOUT CONTRAST  TECHNIQUE: Multidetector CT imaging of the abdomen and pelvis was performed following the standard protocol without IV contrast.  COMPARISON:  November 27, 2014  FINDINGS: Patient status post prior cholecystectomy with intra and extrahepatic biliary ductal dilatation. The liver is otherwise normal. The spleen, pancreas are normal. There is  hypertrophy of bilateral adrenal glands unchanged compared to prior exam. There is no nephrolithiasis or hydroureteronephrosis bilaterally. There is mild scarring of both kidneys. There is atherosclerosis of the abdominal aorta without aneurysmal dilatation. Small lymph nodes are identified in the abdomen unchanged compared to prior exam.  There is no small bowel obstruction or diverticulitis. Patient is status post prior appendectomy. Nasogastric tube is identified with distal tip in the stomach.  The bladder is decompressed limiting evaluation. The uterus is normal. No focal pneumonia or pleural effusion is identified in the visualized lung bases. Degenerative joint changes of the spine are identified.  IMPRESSION: No acute abnormality identified in the abdomen and pelvis.  Status post prior cholecystectomy with postsurgical intra and extrahepatic biliary ductal dilatation.   Electronically Signed   By: Abelardo Diesel M.D.   On: 12/04/2014 22:24   US Abdomen Complete  12/05/2014   CLINICAL DATA:  Elevated transaminase levels. Acute renal failure. Previous cholecystectomy. Initial encounter.  EXAM: ULTRASOUND ABDOMEN COMPLETE  COMPARISON:  CT 12/04/2014.  FINDINGS: Gallbladder: Status post cholecystectomy  Common bile duct: Diameter: 3 mm.  Liver: The liver is small with contour irregularity and diffusely heterogeneous echotexture. No focal lesion observed.  IVC: No abnormality visualized.  Pancreas: Fatty replaced and atrophy.  No focal abnormality.  Spleen: Largely obscured by bowel gas and poorly visualized.  Right Kidney: Length: 9.7 cm. There is cortical thinning and lobularity, but no hydronephrosis or suspicious lesion.  Left Kidney: Length: 11.5 cm. Mild cortical thinning. No hydronephrosis.  Abdominal aorta: Suboptimally visualized. Atherosclerosis without evidence of aneurysm.  Other findings: None.  IMPRESSION: 1. No acute findings. No biliary dilatation status post cholecystectomy. 2. Examination is  limited by body habitus and bowel gas. The parenchymal organs were better visualized on CT done yesterday.   Electronically Signed   By: Richardean Sale M.D.   On: 12/05/2014 20:54   Dg Chest Port 1 View  12/04/2014   CLINICAL DATA:  Hypoxia. Nausea and vomiting. Increased somnolence for 2 days.  EXAM: PORTABLE CHEST 1 VIEW  COMPARISON:  Frontal and lateral views 11/29/2014  FINDINGS: Lower lung volumes compared to prior exam accentuating bronchovascular structures and cardiac size. Again seen elevation of right hemidiaphragm with adjacent compressive atelectasis. No confluent  airspace disease, large pleural effusion, pneumothorax or pulmonary edema. The osseous structures are unchanged.  IMPRESSION: Lower lung volumes with right basilar atelectasis.   Electronically Signed   By: Jeb Levering M.D.   On: 12/04/2014 18:51    Scheduled Meds: . beta carotene w/minerals  1 tablet Oral BID  . calcium-vitamin D  1 tablet Oral BID  . Chlorhexidine Gluconate Cloth  6 each Topical Q0600  . cholecalciferol  400 Units Oral Daily  . feeding supplement (ENSURE ENLIVE)  237 mL Oral BID BM  . gabapentin  300 mg Oral QHS  . imipenem-cilastatin  250 mg Intravenous Q12H  . insulin aspart  0-9 Units Subcutaneous TID WC  . lactulose  20 g Oral BID  . multivitamin with minerals  1 tablet Oral Daily  . mupirocin ointment  1 application Nasal BID  . polyethylene glycol  17 g Oral Daily  . sodium chloride  1,000 mL Intravenous Once  . sodium chloride  3 mL Intravenous Q12H   Continuous Infusions: . pantoprozole (PROTONIX) infusion 8 mg/hr (12/06/14 0700)  .  sodium bicarbonate 150 mEq in sterile water 1000 mL infusion 75 mL/hr at 12/06/14 0706    Principal Problem:   Sepsis Active Problems:   Macular degeneration (senile) of retina   Essential hypertension   Atrial fibrillation   Osteoporosis   GERD (gastroesophageal reflux disease)   DM (diabetes mellitus), type 2 with renal complications   Acute  renal failure superimposed on stage 3 chronic kidney disease   Protein-calorie malnutrition   Pressure ulcer, stage 3   Type 2 diabetes mellitus with diabetic polyneuropathy   Constipation, chronic   Major depression, chronic   Chronic diastolic CHF (congestive heart failure)   UTI (lower urinary tract infection)   UGI bleed   Nausea & vomiting   Acute encephalopathy   Decubitus ulcer of ankle, stage 3   GI hemorrhage   Abnormal LFTs    Time spent: 35 minutes.     Niel Hummer A  [facility] Pager [phone]. If 7PM-7AM, please contact night-coverage at [email or web address], password [facility] 12/06/2014, 8:14 AM  LOS: 2 days

## 2014-12-07 ENCOUNTER — Encounter (HOSPITAL_COMMUNITY)
Admission: EM | Disposition: A | Discharge status: Skilled Nursing Facility | Payer: Self-pay | Source: Home / Self Care | Attending: Internal Medicine

## 2014-12-07 ENCOUNTER — Inpatient Hospital Stay (HOSPITAL_COMMUNITY): Payer: Medicare Other

## 2014-12-07 ENCOUNTER — Inpatient Hospital Stay (HOSPITAL_COMMUNITY): Payer: Medicare Other | Admitting: Anesthesiology

## 2014-12-07 ENCOUNTER — Encounter (HOSPITAL_COMMUNITY): Payer: Self-pay

## 2014-12-07 DIAGNOSIS — K8050 Calculus of bile duct without cholangitis or cholecystitis without obstruction: Secondary | ICD-10-CM

## 2014-12-07 HISTORY — PX: ERCP: SHX5425

## 2014-12-07 LAB — CBC
HCT: 32.8 % — ABNORMAL LOW (ref 36.0–46.0)
HEMOGLOBIN: 10.7 g/dL — AB (ref 12.0–15.0)
MCH: 32.9 pg (ref 26.0–34.0)
MCHC: 32.6 g/dL (ref 30.0–36.0)
MCV: 100.9 fL — ABNORMAL HIGH (ref 78.0–100.0)
PLATELETS: 260 10*3/uL (ref 150–400)
RBC: 3.25 MIL/uL — AB (ref 3.87–5.11)
RDW: 13.1 % (ref 11.5–15.5)
WBC: 14.9 10*3/uL — ABNORMAL HIGH (ref 4.0–10.5)

## 2014-12-07 LAB — URINALYSIS, ROUTINE W REFLEX MICROSCOPIC
BILIRUBIN URINE: NEGATIVE
Glucose, UA: NEGATIVE mg/dL
HGB URINE DIPSTICK: NEGATIVE
Ketones, ur: NEGATIVE mg/dL
Nitrite: NEGATIVE
PROTEIN: 30 mg/dL — AB
Specific Gravity, Urine: 1.019 (ref 1.005–1.030)
UROBILINOGEN UA: 0.2 mg/dL (ref 0.0–1.0)
pH: 5.5 (ref 5.0–8.0)

## 2014-12-07 LAB — URINE MICROSCOPIC-ADD ON

## 2014-12-07 LAB — COMPREHENSIVE METABOLIC PANEL
ALBUMIN: 2.6 g/dL — AB (ref 3.5–5.0)
ALK PHOS: 406 U/L — AB (ref 38–126)
ALT: 27 U/L (ref 14–54)
ANION GAP: 13 (ref 5–15)
AST: 24 U/L (ref 15–41)
BUN: 60 mg/dL — ABNORMAL HIGH (ref 6–20)
CALCIUM: 8.6 mg/dL — AB (ref 8.9–10.3)
CHLORIDE: 102 mmol/L (ref 101–111)
CO2: 22 mmol/L (ref 22–32)
Creatinine, Ser: 3.48 mg/dL — ABNORMAL HIGH (ref 0.44–1.00)
GFR calc Af Amer: 13 mL/min — ABNORMAL LOW (ref >60)
GFR calc non Af Amer: 12 mL/min — ABNORMAL LOW (ref >60)
GLUCOSE: 239 mg/dL — AB (ref 65–99)
Potassium: 4.2 mmol/L (ref 3.5–5.1)
SODIUM: 137 mmol/L (ref 135–145)
Total Bilirubin: 1.3 mg/dL — ABNORMAL HIGH (ref 0.3–1.2)
Total Protein: 5.8 g/dL — ABNORMAL LOW (ref 6.5–8.1)

## 2014-12-07 LAB — GLUCOSE, CAPILLARY
GLUCOSE-CAPILLARY: 214 mg/dL — AB (ref 65–99)
Glucose-Capillary: 194 mg/dL — ABNORMAL HIGH (ref 65–99)
Glucose-Capillary: 216 mg/dL — ABNORMAL HIGH (ref 65–99)

## 2014-12-07 LAB — CREATININE, URINE, RANDOM: CREATININE, URINE: 121.92 mg/dL

## 2014-12-07 LAB — C DIFFICILE QUICK SCREEN W PCR REFLEX
C Diff antigen: POSITIVE — AB
C Diff toxin: NEGATIVE

## 2014-12-07 LAB — VANCOMYCIN, RANDOM: VANCOMYCIN RM: 19 ug/mL

## 2014-12-07 LAB — SODIUM, URINE, RANDOM: Sodium, Ur: 16 mmol/L

## 2014-12-07 SURGERY — ERCP, WITH INTERVENTION IF INDICATED
Anesthesia: General

## 2014-12-07 MED ORDER — SODIUM CHLORIDE 0.9 % IV SOLN
INTRAVENOUS | Status: DC | PRN
  Administered 2014-12-07: 25 mL

## 2014-12-07 MED ORDER — PROPOFOL 10 MG/ML IV BOLUS
INTRAVENOUS | Status: AC
  Filled 2014-12-07: qty 20

## 2014-12-07 MED ORDER — SODIUM CHLORIDE 0.9 % IV SOLN
INTRAVENOUS | Status: DC
  Administered 2014-12-07: 09:00:00 via INTRAVENOUS

## 2014-12-07 MED ORDER — SUCCINYLCHOLINE CHLORIDE 20 MG/ML IJ SOLN
INTRAMUSCULAR | Status: DC | PRN
  Administered 2014-12-07: 80 mg via INTRAVENOUS

## 2014-12-07 MED ORDER — FENTANYL CITRATE (PF) 100 MCG/2ML IJ SOLN
INTRAMUSCULAR | Status: AC
  Filled 2014-12-07: qty 4

## 2014-12-07 MED ORDER — PROPOFOL 10 MG/ML IV BOLUS
INTRAVENOUS | Status: DC | PRN
  Administered 2014-12-07: 30 mg via INTRAVENOUS

## 2014-12-07 MED ORDER — LACTATED RINGERS IV SOLN
INTRAVENOUS | Status: DC | PRN
  Administered 2014-12-07 (×2): via INTRAVENOUS

## 2014-12-07 MED ORDER — EPINEPHRINE HCL 0.1 MG/ML IJ SOSY
PREFILLED_SYRINGE | INTRAMUSCULAR | Status: DC | PRN
  Administered 2014-12-07 (×6): 10 ug via INTRAVENOUS

## 2014-12-07 MED ORDER — FENTANYL CITRATE (PF) 100 MCG/2ML IJ SOLN
25.0000 ug | INTRAMUSCULAR | Status: DC | PRN
  Administered 2014-12-07 (×2): 25 ug via INTRAVENOUS

## 2014-12-07 MED ORDER — PHENYLEPHRINE HCL 10 MG/ML IJ SOLN
INTRAMUSCULAR | Status: DC | PRN
  Administered 2014-12-07: 40 ug via INTRAVENOUS
  Administered 2014-12-07: 80 ug via INTRAVENOUS
  Administered 2014-12-07 (×3): 40 ug via INTRAVENOUS

## 2014-12-07 MED ORDER — EPHEDRINE SULFATE 50 MG/ML IJ SOLN
INTRAMUSCULAR | Status: DC | PRN
  Administered 2014-12-07 (×2): 10 mg via INTRAVENOUS

## 2014-12-07 MED ORDER — VANCOMYCIN HCL 10 G IV SOLR
1500.0000 mg | INTRAVENOUS | Status: DC
  Administered 2014-12-07: 1500 mg via INTRAVENOUS
  Filled 2014-12-07: qty 1500

## 2014-12-07 MED ORDER — BISACODYL 10 MG RE SUPP
10.0000 mg | Freq: Once | RECTAL | Status: AC
  Administered 2014-12-07: 10 mg via RECTAL
  Filled 2014-12-07: qty 1

## 2014-12-07 MED ORDER — ETOMIDATE 2 MG/ML IV SOLN
INTRAVENOUS | Status: DC | PRN
  Administered 2014-12-07: 6 mg via INTRAVENOUS

## 2014-12-07 MED ORDER — LACTATED RINGERS IV SOLN: INTRAVENOUS | Status: DC

## 2014-12-07 MED ORDER — DEXTROSE-NACL 5-0.45 % IV SOLN
INTRAVENOUS | Status: DC
  Administered 2014-12-07 (×2): via INTRAVENOUS

## 2014-12-07 MED ORDER — CETYLPYRIDINIUM CHLORIDE 0.05 % MT LIQD
7.0000 mL | Freq: Two times a day (BID) | OROMUCOSAL | Status: DC
  Administered 2014-12-07 – 2014-12-08 (×3): 7 mL via OROMUCOSAL

## 2014-12-07 NOTE — Progress Notes (Signed)
ANTIBIOTIC CONSULT NOTE - FOLLOW UP  Pharmacy Consult for Vancomycin and Primaxin Indication: ESBL UTI, sepsis  Allergies  Allergen Reactions  . Celebrex [Celecoxib]   . Nsaids   . Sulfa Antibiotics     Patient Measurements: Height: 5\' 10"  (177.8 cm) Weight: 251 lb 5.2 oz (114 kg) IBW/kg (Calculated) : 68.5  Vital Signs: Temp: 98.6 F (37 C) (10/01 0000) Temp Source: Oral (10/01 0000) BP: 140/51 mmHg (10/01 0200) Pulse Rate: 91 (10/01 0300) Intake/Output from previous day: 09/30 0701 - 10/01 0700 In: 3062.5 [P.O.:120; I.V.:2742.5; IV Piggyback:200] Out: 175 [Urine:175] Intake/Output from this shift: Total I/O In: 1220 [P.O.:120; I.V.:900; IV Piggyback:200] Out: 175 [Urine:175]  Labs:  Recent Labs  12/05/14 0325 12/05/14 0937 12/06/14 0434 12/07/14 0405  WBC 15.7* 14.6* 17.3* 14.9*  HGB 10.2* 10.5* 10.5* 10.7*  PLT 239 281 251 260  CREATININE 4.15*  --  3.82* 3.48*   Estimated Creatinine Clearance: 17.9 mL/min (by C-G formula based on Cr of 3.48).  Recent Labs  12/06/14 2344  [facility] 19     Microbiology: Recent Results (from the past 720 hour(s))  Urine culture     Status: None   Collection Time: 11/30/14 10:30 PM  Result Value Ref Range Status   Specimen Description URINE, CATHETERIZED  Final   Special Requests NONE  Final   Culture   Final    >=100,000 COLONIES/mL ESCHERICHIA COLI Confirmed Extended Spectrum Beta-Lactamase Producer (ESBL) Performed at [facility]    Report Status 12/04/2014 FINAL  Final   Organism ID, Bacteria ESCHERICHIA COLI  Final      Susceptibility   Escherichia coli - MIC*    AMPICILLIN >=32 RESISTANT Resistant     CEFAZOLIN >=64 RESISTANT Resistant     CEFTRIAXONE >=64 RESISTANT Resistant     CIPROFLOXACIN >=4 RESISTANT Resistant     GENTAMICIN >=16 RESISTANT Resistant     IMIPENEM <=0.25 SENSITIVE Sensitive     NITROFURANTOIN <=16 SENSITIVE Sensitive     TRIMETH/SULFA <=20 SENSITIVE Sensitive    AMPICILLIN/SULBACTAM >=32 RESISTANT Resistant     PIP/TAZO >=128 RESISTANT Resistant     * >=100,000 COLONIES/mL ESCHERICHIA COLI  Blood culture (routine x 2)     Status: None (Preliminary result)   Collection Time: 12/04/14  6:00 PM  Result Value Ref Range Status   Specimen Description BLOOD LEFT HAND  Final   Special Requests BOTTLES DRAWN AEROBIC AND ANAEROBIC 5CC EACH  Final   Culture   Final    NO GROWTH 2 DAYS Performed at [facility]    Report Status PENDING  Incomplete  Blood culture (routine x 2)     Status: None (Preliminary result)   Collection Time: 12/04/14  6:00 PM  Result Value Ref Range Status   Specimen Description BLOOD RIGHT HAND  Final   Special Requests BOTTLES DRAWN AEROBIC AND ANAEROBIC 5CC EACH  Final   Culture   Final    NO GROWTH 2 DAYS Performed at [facility]    Report Status PENDING  Incomplete  MRSA PCR Screening     Status: Abnormal   Collection Time: 12/04/14 11:16 PM  Result Value Ref Range Status   MRSA by PCR POSITIVE (A) NEGATIVE Final    Comment:        The GeneXpert MRSA Assay (FDA approved for NASAL specimens only), is one component of a comprehensive MRSA colonization surveillance program. It is not intended to diagnose MRSA infection nor to guide or monitor treatment for MRSA  infections. RESULT CALLED TO, READ BACK BY AND VERIFIED WITH: NI,A/2W@0315  ON 12/05/14 BY KARCZEWSKI,S.     Anti-infectives    Start     Dose/Rate Route Frequency Ordered Stop   12/07/14 0800  vancomycin (VANCOCIN) 1,500 mg in sodium chloride 0.9 % 500 mL IVPB     1,500 mg 250 mL/hr over 120 Minutes Intravenous Every 48 hours 12/07/14 0611     12/05/14 0900  imipenem-cilastatin (PRIMAXIN) 250 mg in sodium chloride 0.9 % 100 mL IVPB     250 mg 200 mL/hr over 30 Minutes Intravenous Every 12 hours 12/04/14 2109     12/04/14 2200  imipenem-cilastatin (PRIMAXIN) 500 mg in sodium chloride 0.9 % 100 mL IVPB  Status:  Discontinued     500 mg 200  mL/hr over 30 Minutes Intravenous 3 times per day 12/04/14 2049 12/04/14 2053   12/04/14 2115  vancomycin (VANCOCIN) 2,000 mg in sodium chloride 0.9 % 500 mL IVPB     2,000 mg 250 mL/hr over 120 Minutes Intravenous NOW 12/04/14 2109 12/05/14 0113   12/04/14 2115  imipenem-cilastatin (PRIMAXIN) 250 mg in sodium chloride 0.9 % 100 mL IVPB     250 mg 200 mL/hr over 30 Minutes Intravenous NOW 12/04/14 2109 12/04/14 2221      Assessment: Patient's a 81 y.o F with ESBL UTI from ucx on 9/24 and was treated with keflex PTA. She was referred to the ED on 9/28 by her PCP for further workup of elevated LFTs and ammonia level. In the ED, she was found to have elevated WBC and LA. To start abx for suspected sepsis secondary to UTI.  9/28 >> vancomycin 2g x 1 at 23:13 >> 9/28 >> primaxin 250mg  q12h >>  9/28 blood x2: 9/28 urine: 9/24 ucx: >100K ESBL Ecoli (S= primaxin, nitrof, bactrim)  9/30 Remains afebrile WBC elevated, 17.3k AoCRF (baseline SCr 1.4), SCr slowly improving, CrCl 12 ml/min/1.75m2 (normalized) Today, 10/1 A-febrile WBC=14.9 Scr still slowly improving, CrCl~15 ml/min (N)  Goal of Therapy:  Vancomycin trough level 15-20 mcg/ml  Primaxin dose per weight and renal function  Plan:   Vancomycin 1500mg  IV q48h  Continue Primaxin 250mg  IV q12h Follow up renal function & cultures, de-escalate as appropriate    Lawana Pai R 12/07/2014,6:15 AM

## 2014-12-07 NOTE — Anesthesia Procedure Notes (Signed)

## 2014-12-07 NOTE — Transfer of Care (Signed)
Immediate Anesthesia Transfer of Care Note  Patient: Kathryn [name]  Procedure(s) Performed: Procedure(s): ENDOSCOPIC RETROGRADE CHOLANGIOPANCREATOGRAPHY (ERCP) (N/A)  Patient Location: ICU  Anesthesia Type:General  Level of Consciousness: awake, alert  and oriented  Airway & Oxygen Therapy: Patient Spontanous Breathing and Patient connected to face mask oxygen  Post-op Assessment: Report given to RN and Post -op Vital signs reviewed and stable  Post vital signs: Reviewed and stable  Last Vitals:  Filed Vitals:   12/07/14 1744  BP: 137/72  Pulse:   Temp:   Resp:     Complications: No apparent anesthesia complications

## 2014-12-07 NOTE — Anesthesia Postprocedure Evaluation (Signed)
  Anesthesia Post-op Note  Patient: Kathryn [name]  Procedure(s) Performed: Procedure(s) (LRB): ENDOSCOPIC RETROGRADE CHOLANGIOPANCREATOGRAPHY (ERCP) (N/A)  Patient Location: ICU  Anesthesia Type: General  Level of Consciousness: awake and alert   Airway and Oxygen Therapy: Patient Spontanous Breathing  Post-op Pain: mild  Post-op Assessment: Post-op Vital signs reviewed, Patient's Cardiovascular Status Stable, Respiratory Function Stable, Patent Airway and No signs of Nausea or vomiting  Last Vitals:  Filed Vitals:   12/07/14 1758  BP: 113/39  Pulse: 76  Temp:   Resp: 12    Post-op Vital Signs: stable   Complications: No apparent anesthesia complications

## 2014-12-07 NOTE — Anesthesia Preprocedure Evaluation (Addendum)
Anesthesia Evaluation  Patient identified by MRN, date of birth, ID band Patient awake    Reviewed: Allergy & Precautions, H&P , NPO status , Patient's Chart, lab work & pertinent test results, reviewed documented beta blocker date and time   Airway Mallampati: II  TM Distance: >3 FB Neck ROM: full    Dental  (+) Caps, Dental Advisory Given, Missing,  Multiple missing front teeth:   Pulmonary neg pulmonary ROS,    Pulmonary exam normal breath sounds clear to auscultation       Cardiovascular Exercise Tolerance: Poor hypertension, Pt. on medications and Pt. on home beta blockers +CHF  + dysrhythmias Atrial Fibrillation  Rhythm:Irregular Rate:Normal  ECG - AF   Neuro/Psych negative neurological ROS  negative psych ROS   GI/Hepatic negative GI ROS, Neg liver ROS, GERD  ,  Endo/Other  diabetes, Well Controlled, Type 2, Insulin DependentMorbid obesity  Renal/GU CRFRenal diseasenegative Renal ROSCr 3.5 BUN 60  negative genitourinary   Musculoskeletal   Abdominal (+) + obese,   Peds  Hematology negative hematology ROS (+) anemia , hgb 10.7   Anesthesia Other Findings   Reproductive/Obstetrics negative OB ROS                         Anesthesia Physical Anesthesia Plan  ASA: IV and emergent  Anesthesia Plan: General   Post-op Pain Management:    Induction: Intravenous, Rapid sequence and Cricoid pressure planned  Airway Management Planned: Oral ETT  Additional Equipment:   Intra-op Plan:   Post-operative Plan: Possible Post-op intubation/ventilation  Informed Consent:   Plan Discussed with: Surgeon  Anesthesia Plan Comments:       Anesthesia Quick Evaluation

## 2014-12-07 NOTE — Progress Notes (Signed)
[facility] PROGRESS NOTE  Kathryn [name]:[id number] DOB: [birth year] DOA: 12/04/2014 PCP: Blanchie Serve, MD  Assessment/Plan: Kathryn [name] is a 81 y.o. female with PMH of bedbound, , hypertension, diabetes mellitus, GERD, depression, atrial fibrillation not on anticoagulant (unclear reason), recurrent UTI, diastolic congestive heart failure, CKD-III, who admitted because for nausea, vomiting, constipation, acute encephalopathy.  Sepsis: Likely due  UTI. Patient has been treated with Keflex for positive urinalysis with Escherichia coli, which are resistant to lactamase. -Continue with  IV Vancomycin and Primaxin -follow up Bx and Ux -will discontinue IV fluids to avoid volume overload.  -Lactic acid trending down.   UTI: -E coli from culture 9-24, multiresistant, sensitive to imipenem.   Transaminases:  -GTT elevated, Alk phosphatase elevated  -RUQ [place]: No acute findings. No biliary dilatation status post cholecystectomy. -GI consulted. MRCP with filling defect. Plan for ERCP today.   Acute encephalopathy: She is mildly confused, but oriented 3. Likely due to sepsis and UTI. -Treat UTI and sepsis as above -ammonia level at 11.  -will change oxycodone to PRN. -increased lactulose 9-30. Will order dulcolax suppository.   Nausea and vomiting: Etiology is not clear, likely due to UTI and sepsis. CT abdomen/pelvis is negative for acute abnormalities.  -IVF as above -prn Zofran for nausea, morphine for pain - lipase at 12. Remove NG tube today if ok by GI.   AoCKD-III: Baseline Cr is 1.4 , her Cr is 4.49 on admission. Hemodynamic related, hypotension, infection, sepsis, also use of ACE.  - Hold lisinopril.  -will discontinue IV fluids, no significant urine out put in 24 hours. Nephrologist consulted.  -cr trending down.  -foley catheter place 9-30.   Possible UGI: Nurse noticed coffee-ground material from her vomited gastric materials.Differential : acute gastritis or  stress ulcers. Patient does not have history of cirrhosis. Her hemoglobin is 12.6 on 11/29/14-->12.0.  - continue IV Protonix - Avoid NSAIDs and SQ heparin -Hb stable.  -GI consulted. No need for endoscopy.   Atrial Fibrillation: CHA2DS2-VASc Score is 6, needs oral anticoagulation. Patient is not on AC at home (unclear reason), now presents with possible GIB, should not start anticoagulants.  -resume metoprolol, lower dose on 9-29.   DM-II: Last A1c 5.8 on 06/04/14.  -SSI  HTN: -Hold amlodipine and lisinopril since patient blood pressure is a soft due to sepsis  Protein-calorie malnutrition: -Ensure when pt is able to eat.  Chronic diastolic CHF (congestive heart failure): compensated.  - BNP; 167 -resume metoprolol lower dose.   Decubitus ulcer-stage 3: -Wound care consulted.    Code Status: DNR Family Communication: care discussed with son 9-29 Disposition Plan: Remain in the step down unit.   Consultants:  GI  Procedures:  Abdominal US  Antibiotics:  Vancomycin  Imipenem/   HPI/Subjective: She is alert, oriented to place and situation.  Denies abdominal pain,   Objective: Filed Vitals:   12/07/14 0700  BP:   Pulse: 68  Temp:   Resp: 11    Intake/Output Summary (Last 24 hours) at 12/07/14 0747 Last data filed at 12/07/14 0700  Gross per 24 hour  Intake 3362.5 ml  Output    175 ml  Net 3187.5 ml   Filed Weights   12/04/14 2230 12/07/14 0103  Weight: 105.6 kg (232 lb 12.9 oz) 114 kg (251 lb 5.2 oz)    Exam:   General:  Alert in no distress. NG tube in place.   Cardiovascular: S 1, S 2 IRR  Respiratory: CTA  Abdomen: BS  present, mildly distended.   Musculoskeletal: trace edema  Data Reviewed: Basic Metabolic Panel:  Recent Labs Lab 12/04/14 1804 12/05/14 0325 12/06/14 0434 12/07/14 0405  NA 133* 133* 134* 137  K 5.0 5.0 4.0 4.2  CL 95* 101 105 102  CO2 21* 18* 16* 22  GLUCOSE 242* 231* 148* 239*  BUN 60* 59* 61* 60*   CREATININE 4.49* 4.15* 3.82* 3.48*  CALCIUM 10.7* 9.0 8.6* 8.6*   Liver Function Tests:  Recent Labs Lab 12/04/14 1804 12/05/14 0325 12/06/14 0840 12/07/14 0405  AST 53* 39 29 24  ALT 48 37 29 27  ALKPHOS 665* 519* 414* 406*  BILITOT 1.4* 1.4* 1.1 1.3*  PROT 7.0 5.8* 5.6* 5.8*  ALBUMIN 3.2* 2.7* 2.4* 2.6*    Recent Labs Lab 12/05/14 0130 12/06/14 0840  LIPASE 12* 18*    Recent Labs Lab 12/04/14 1805  AMMONIA 11   CBC:  Recent Labs Lab 12/04/14 1804 12/04/14 2127 12/05/14 0325 12/05/14 0937 12/06/14 0434 12/07/14 0405  WBC 21.2* 16.5* 15.7* 14.6* 17.3* 14.9*  NEUTROABS 20.2*  --   --   --   --   --   HGB 12.0 11.0* 10.2* 10.5* 10.5* 10.7*  HCT 35.5* 33.2* 31.0* 31.9* 31.3* 32.8*  MCV 100.0 101.2* 101.3* 103.2* 100.0 100.9*  PLT 299 267 239 281 251 260   Cardiac Enzymes: No results for input(s): CKTOTAL, CKMB, CKMBINDEX, TROPONINI in the last 168 hours. BNP (last 3 results)  Recent Labs  12/05/14 0325  BNP 167.5*    ProBNP (last 3 results) No results for input(s): PROBNP in the last 8760 hours.  CBG:  Recent Labs Lab 12/05/14 2208 12/06/14 0808 12/06/14 1236 12/06/14 1641 12/06/14 1938  GLUCAP 134* 159* 180* 189* 211*    Recent Results (from the past 240 hour(s))  Urine culture     Status: None   Collection Time: 11/30/14 10:30 PM  Result Value Ref Range Status   Specimen Description URINE, CATHETERIZED  Final   Special Requests NONE  Final   Culture   Final    >=100,000 COLONIES/mL ESCHERICHIA COLI Confirmed Extended Spectrum Beta-Lactamase Producer (ESBL) Performed at [facility]    Report Status 12/04/2014 FINAL  Final   Organism ID, Bacteria ESCHERICHIA COLI  Final      Susceptibility   Escherichia coli - MIC*    AMPICILLIN >=32 RESISTANT Resistant     CEFAZOLIN >=64 RESISTANT Resistant     CEFTRIAXONE >=64 RESISTANT Resistant     CIPROFLOXACIN >=4 RESISTANT Resistant     GENTAMICIN >=16 RESISTANT Resistant      IMIPENEM <=0.25 SENSITIVE Sensitive     NITROFURANTOIN <=16 SENSITIVE Sensitive     TRIMETH/SULFA <=20 SENSITIVE Sensitive     AMPICILLIN/SULBACTAM >=32 RESISTANT Resistant     PIP/TAZO >=128 RESISTANT Resistant     * >=100,000 COLONIES/mL ESCHERICHIA COLI  Blood culture (routine x 2)     Status: None (Preliminary result)   Collection Time: 12/04/14  6:00 PM  Result Value Ref Range Status   Specimen Description BLOOD LEFT HAND  Final   Special Requests BOTTLES DRAWN AEROBIC AND ANAEROBIC 5CC EACH  Final   Culture   Final    NO GROWTH 2 DAYS Performed at [facility]    Report Status PENDING  Incomplete  Blood culture (routine x 2)     Status: None (Preliminary result)   Collection Time: 12/04/14  6:00 PM  Result Value Ref Range Status   Specimen Description BLOOD RIGHT  HAND  Final   Special Requests BOTTLES DRAWN AEROBIC AND ANAEROBIC 5CC EACH  Final   Culture   Final    NO GROWTH 2 DAYS Performed at [facility]    Report Status PENDING  Incomplete  MRSA PCR Screening     Status: Abnormal   Collection Time: 12/04/14 11:16 PM  Result Value Ref Range Status   MRSA by PCR POSITIVE (A) NEGATIVE Final    Comment:        The GeneXpert MRSA Assay (FDA approved for NASAL specimens only), is one component of a comprehensive MRSA colonization surveillance program. It is not intended to diagnose MRSA infection nor to guide or monitor treatment for MRSA infections. RESULT CALLED TO, READ BACK BY AND VERIFIED WITH: NI,A/2W@0315  ON 12/05/14 BY KARCZEWSKI,S.      Studies: US Abdomen Complete  12/05/2014   CLINICAL DATA:  Elevated transaminase levels. Acute renal failure. Previous cholecystectomy. Initial encounter.  EXAM: ULTRASOUND ABDOMEN COMPLETE  COMPARISON:  CT 12/04/2014.  FINDINGS: Gallbladder: Status post cholecystectomy  Common bile duct: Diameter: 3 mm.  Liver: The liver is small with contour irregularity and diffusely heterogeneous echotexture. No focal  lesion observed.  IVC: No abnormality visualized.  Pancreas: Fatty replaced and atrophy.  No focal abnormality.  Spleen: Largely obscured by bowel gas and poorly visualized.  Right Kidney: Length: 9.7 cm. There is cortical thinning and lobularity, but no hydronephrosis or suspicious lesion.  Left Kidney: Length: 11.5 cm. Mild cortical thinning. No hydronephrosis.  Abdominal aorta: Suboptimally visualized. Atherosclerosis without evidence of aneurysm.  Other findings: None.  IMPRESSION: 1. No acute findings. No biliary dilatation status post cholecystectomy. 2. Examination is limited by body habitus and bowel gas. The parenchymal organs were better visualized on CT done yesterday.   Electronically Signed   By: Richardean Sale M.D.   On: 12/05/2014 20:54   Mr Abdomen Mrcp Wo Cm  12/06/2014   CLINICAL DATA:  Biliary dilatation status post cholecystectomy. Possible biliary sepsis. Acute encephalopathy and renal insufficiency. Initial encounter.  EXAM: MRI ABDOMEN WITHOUT CONTRAST  (INCLUDING MRCP)  TECHNIQUE: Multiplanar multisequence MR imaging of the abdomen was performed. Heavily T2-weighted images of the biliary and pancreatic ducts were obtained, and three-dimensional MRCP images were rendered by post processing.  COMPARISON:  CTs 04/25/2011, 11/27/2014 and 12/04/2014.  FINDINGS: Study is moderately motion degraded due to the patient's inability to suspend respiration.  Lower chest: There are small bilateral pleural effusions and associated bibasilar atelectasis which appear mildly worse.  Hepatobiliary: No focal hepatic abnormalities are identified. There is mild intra and extrahepatic biliary dilatation. The common hepatic duct measures up to 13 mm in diameter. There are small dependent filling defects within the distal common bile duct which appear somewhat linear on the reformatted images, favored to reflect several small intraductal stones, best seen on series 5.  Pancreas: Atrophy. No evidence of  pancreatic mass, pancreatic ductal dilatation or surrounding inflammation.  Spleen: Normal in size without focal abnormality.  Adrenals/Urinary Tract: Stable mild left adrenal hyperplasia. Both kidneys demonstrate cortical thinning, but no hydronephrosis or suspicious focal abnormality.  Stomach/Bowel: Mild distension of the colon with fluid and air appears unchanged. No bowel wall thickening or focal inflammatory change identified.  Vascular/Lymphatic: There are no enlarged abdominal lymph nodes. Diffuse atherosclerosis better demonstrated by CT.  Other: There is diffuse muscular atrophy. There is mild generalized soft tissue edema with asymmetric flank edema on the right. Calcified breast lesion appears unchanged.  Musculoskeletal: No acute or significant osseous  findings.  IMPRESSION: 1. Mild biliary dilatation status post cholecystectomy. Small filling defects within the distal common bile duct are suspicious for choledocholithiasis. 2. No evidence of pancreatic mass or pancreatic ductal dilatation. 3. Enlarging small bilateral pleural effusions with diffuse soft tissue edema suspicious for anasarca. 4. Stable renal cortical thinning and left adrenal hyperplasia.   Electronically Signed   By: Richardean Sale M.D.   On: 12/06/2014 10:42   Mr 3d Recon At Scanner  12/06/2014   CLINICAL DATA:  Biliary dilatation status post cholecystectomy. Possible biliary sepsis. Acute encephalopathy and renal insufficiency. Initial encounter.  EXAM: MRI ABDOMEN WITHOUT CONTRAST  (INCLUDING MRCP)  TECHNIQUE: Multiplanar multisequence MR imaging of the abdomen was performed. Heavily T2-weighted images of the biliary and pancreatic ducts were obtained, and three-dimensional MRCP images were rendered by post processing.  COMPARISON:  CTs 04/25/2011, 11/27/2014 and 12/04/2014.  FINDINGS: Study is moderately motion degraded due to the patient's inability to suspend respiration.  Lower chest: There are small bilateral pleural  effusions and associated bibasilar atelectasis which appear mildly worse.  Hepatobiliary: No focal hepatic abnormalities are identified. There is mild intra and extrahepatic biliary dilatation. The common hepatic duct measures up to 13 mm in diameter. There are small dependent filling defects within the distal common bile duct which appear somewhat linear on the reformatted images, favored to reflect several small intraductal stones, best seen on series 5.  Pancreas: Atrophy. No evidence of pancreatic mass, pancreatic ductal dilatation or surrounding inflammation.  Spleen: Normal in size without focal abnormality.  Adrenals/Urinary Tract: Stable mild left adrenal hyperplasia. Both kidneys demonstrate cortical thinning, but no hydronephrosis or suspicious focal abnormality.  Stomach/Bowel: Mild distension of the colon with fluid and air appears unchanged. No bowel wall thickening or focal inflammatory change identified.  Vascular/Lymphatic: There are no enlarged abdominal lymph nodes. Diffuse atherosclerosis better demonstrated by CT.  Other: There is diffuse muscular atrophy. There is mild generalized soft tissue edema with asymmetric flank edema on the right. Calcified breast lesion appears unchanged.  Musculoskeletal: No acute or significant osseous findings.  IMPRESSION: 1. Mild biliary dilatation status post cholecystectomy. Small filling defects within the distal common bile duct are suspicious for choledocholithiasis. 2. No evidence of pancreatic mass or pancreatic ductal dilatation. 3. Enlarging small bilateral pleural effusions with diffuse soft tissue edema suspicious for anasarca. 4. Stable renal cortical thinning and left adrenal hyperplasia.   Electronically Signed   By: Richardean Sale M.D.   On: 12/06/2014 10:42    Scheduled Meds: . antiseptic oral rinse  7 mL Mouth Rinse BID  . beta carotene w/minerals  1 tablet Oral BID  . bisacodyl  10 mg Rectal Once  . calcium-vitamin D  1 tablet Oral BID   . Chlorhexidine Gluconate Cloth  6 each Topical Q0600  . cholecalciferol  400 Units Oral Daily  . feeding supplement (ENSURE ENLIVE)  237 mL Oral BID BM  . gabapentin  300 mg Oral QHS  . imipenem-cilastatin  250 mg Intravenous Q12H  . insulin aspart  0-9 Units Subcutaneous TID WC  . lactulose  30 g Oral TID  . metoprolol tartrate  25 mg Oral BID  . multivitamin with minerals  1 tablet Oral Daily  . mupirocin ointment  1 application Nasal BID  . polyethylene glycol  17 g Oral Daily  . sodium chloride  1,000 mL Intravenous Once  . sodium chloride  3 mL Intravenous Q12H  . vancomycin  1,500 mg Intravenous Q48H   Continuous Infusions: .  sodium chloride    . pantoprozole (PROTONIX) infusion 8 mg/hr (12/06/14 2156)    Principal Problem:   Sepsis Active Problems:   Macular degeneration (senile) of retina   Essential hypertension   Atrial fibrillation   Osteoporosis   GERD (gastroesophageal reflux disease)   DM (diabetes mellitus), type 2 with renal complications   Acute renal failure superimposed on stage 3 chronic kidney disease   Protein-calorie malnutrition   Pressure ulcer, stage 3   Type 2 diabetes mellitus with diabetic polyneuropathy   Constipation, chronic   Major depression, chronic   Chronic diastolic CHF (congestive heart failure)   UTI (lower urinary tract infection)   UGI bleed   Nausea & vomiting   Acute encephalopathy   Decubitus ulcer of ankle, stage 3   GI hemorrhage   Abnormal LFTs    Time spent: 35 minutes.     Niel Hummer A  [facility] Pager [phone]. If 7PM-7AM, please contact night-coverage at [email or web address], password [facility] 12/07/2014, 7:47 AM  LOS: 3 days

## 2014-12-07 NOTE — Consult Note (Signed)
Kathryn [name] is an 81 y.o. female referred by Dr Tyrell Antonio   Chief Complaint: Acute on CKD HPI: 81yo F admitted 12/04/14 for N/V thought to be due to UTI vs cholangitis.  Baseline Scr 1.4 and on admission Scr 4.49 but has decreased to 3.48.  SBP in the 80-90's on admission and was on lisinopril.  SBP has improved.  UO has not been well recorded.  FeNa < 1% on admission.  CT abd wo contrast showed no hydronephrosis.  Long hx DM though she cannot tell me exactly how long.  She is bed bound and lives in NH with chronic indwelling catheter.   Past Medical History  Diagnosis Date  . Osteoporosis   . HTN (hypertension)     unspecified  . Atrial fibrillation   . Macular degeneration   . Osteoarthritis   . Edema   . Anemia   . Hydronephrosis     left  . UTI (lower urinary tract infection)   . Pressure ulcer of back   . DM (diabetes mellitus)     lantus  . Depression   . Insomnia   . GERD (gastroesophageal reflux disease)   . CKD (chronic kidney disease), stage III   . Chronic diastolic CHF (congestive heart failure)     Past Surgical History  Procedure Laterality Date  . Replacement total knee  08/2007    bilateral  . Appendectomy    . Cholecystectomy    . Colonoscopy    . Upper gastrointestinal endoscopy    . Cystoscopy w/ ureteral stent placement  04/29/2011    Procedure: CYSTOSCOPY WITH RETROGRADE PYELOGRAM/URETERAL STENT PLACEMENT;  Surgeon: Bernestine Amass, MD;  Location: [facility];  Service: Urology;  Laterality: Bilateral;  bilateral retrograde, double j stent. Cystogram   . Ureteroscopy  04/29/2011    Procedure: URETEROSCOPY;  Surgeon: Bernestine Amass, MD;  Location: [facility];  Service: Urology;  Laterality: Right;  . Cystoscopy with ureteroscopy  05/31/2011    Procedure: CYSTOSCOPY WITH URETEROSCOPY;  Surgeon: Bernestine Amass, MD;  Location: [facility];  Service: Urology;  Laterality: Right;  Ureteral biopsy Right      Family History  Problem Relation Age of Onset  . Diabetes  Father   . Atrial fibrillation Father   . Breast cancer Paternal Grandmother   . Heart disease Mother   No FH of ESRD  Social History:  reports that she has never smoked. She has never used smokeless tobacco. She reports that she does not drink alcohol or use illicit drugs. Resides at [facility] Allergies:  Allergies  Allergen Reactions  . Celebrex [Celecoxib]   . Nsaids   . Sulfa Antibiotics     Medications Prior to Admission  Medication Sig Dispense Refill  . amLODipine (NORVASC) 10 MG tablet Take 10 mg by mouth daily.    . beta carotene w/minerals (OCUVITE) tablet Take 1 tablet by mouth 2 (two) times daily.     . Calcium Carbonate-Vitamin D 600-400 MG-UNIT per tablet Take 1 tablet by mouth 2 (two) times daily.     . cephALEXin (KEFLEX) 500 MG capsule Take 1 capsule (500 mg total) by mouth 3 (three) times daily. (Patient taking differently: Take 500 mg by mouth 2 (two) times daily. ) 15 capsule 0  . Cholecalciferol (VITAMIN D3) 2000 UNITS TABS Take 1 tablet by mouth daily.     Marland Kitchen gabapentin (NEURONTIN) 300 MG capsule Take 300 mg by mouth at bedtime.     . insulin lispro (HUMALOG)  100 UNIT/ML cartridge Inject into the skin. Check blood sugar three times daily with meals if CBG> 200 inject 5 units subcutaneously     . lisinopril (PRINIVIL,ZESTRIL) 40 MG tablet Take 40 mg by mouth daily.    . metoprolol (LOPRESSOR) 50 MG tablet Take 50 mg by mouth 2 (two) times daily. Hold for HR < 60    . Multiple Vitamins-Minerals (DECUBI-VITE) CAPS Take 1 capsule by mouth daily.     . ondansetron (ZOFRAN) 4 MG tablet Take 4 mg by mouth every 8 (eight) hours as needed for nausea or vomiting.    . polyethylene glycol (MIRALAX / GLYCOLAX) packet Take 17 g by mouth every morning. For constipation    . Protein (PROCEL) POWD Take by mouth. 1 scoop in 6-8 oz of liquid and take by mouth twice daily for nutritional support and increase protein    . AMBULATORY NON FORMULARY MEDICATION Medication Name: Med  Pass 240 mL with water three times daily for hydration    . benzonatate (TESSALON) 100 MG capsule Take 100 mg by mouth 3 (three) times daily as needed for cough (x 7 days starting 08/20/14).    . hydrOXYzine (ATARAX/VISTARIL) 25 MG tablet Take 25 mg by mouth every 8 (eight) hours as needed for itching.    Marland Kitchen ipratropium-albuterol (DUONEB) 0.5-2.5 (3) MG/3ML SOLN Take 3 mLs by nebulization. Administer 3 ml via nebulizer four times a day as needed for cough and wheezing    . simethicone (MYLICON) 80 MG chewable tablet Chew 160 mg by mouth 3 (three) times daily. For gas pains.       Lab Results: UA: 11-20 wbc, many yeast. 30 protein   Recent Labs  12/05/14 0937 12/06/14 0434 12/07/14 0405  WBC 14.6* 17.3* 14.9*  HGB 10.5* 10.5* 10.7*  HCT 31.9* 31.3* 32.8*  PLT 281 251 260   BMET  Recent Labs  12/05/14 0325 12/06/14 0434 12/07/14 0405  NA 133* 134* 137  K 5.0 4.0 4.2  CL 101 105 102  CO2 18* 16* 22  GLUCOSE 231* 148* 239*  BUN 59* 61* 60*  CREATININE 4.15* 3.82* 3.48*  CALCIUM 9.0 8.6* 8.6*   LFT  Recent Labs  12/06/14 0840 12/07/14 0405  PROT 5.6* 5.8*  ALBUMIN 2.4* 2.6*  AST 29 24  ALT 29 27  ALKPHOS 414* 406*  BILITOT 1.1 1.3*  BILIDIR 0.3  --   IBILI 0.8  --    US Abdomen Complete  12/05/2014   CLINICAL DATA:  Elevated transaminase levels. Acute renal failure. Previous cholecystectomy. Initial encounter.  EXAM: ULTRASOUND ABDOMEN COMPLETE  COMPARISON:  CT 12/04/2014.  FINDINGS: Gallbladder: Status post cholecystectomy  Common bile duct: Diameter: 3 mm.  Liver: The liver is small with contour irregularity and diffusely heterogeneous echotexture. No focal lesion observed.  IVC: No abnormality visualized.  Pancreas: Fatty replaced and atrophy.  No focal abnormality.  Spleen: Largely obscured by bowel gas and poorly visualized.  Right Kidney: Length: 9.7 cm. There is cortical thinning and lobularity, but no hydronephrosis or suspicious lesion.  Left Kidney: Length:  11.5 cm. Mild cortical thinning. No hydronephrosis.  Abdominal aorta: Suboptimally visualized. Atherosclerosis without evidence of aneurysm.  Other findings: None.  IMPRESSION: 1. No acute findings. No biliary dilatation status post cholecystectomy. 2. Examination is limited by body habitus and bowel gas. The parenchymal organs were better visualized on CT done yesterday.   Electronically Signed   By: Richardean Sale M.D.   On: 12/05/2014 20:54   Mr Abdomen  Mrcp Wo Cm  12/06/2014   CLINICAL DATA:  Biliary dilatation status post cholecystectomy. Possible biliary sepsis. Acute encephalopathy and renal insufficiency. Initial encounter.  EXAM: MRI ABDOMEN WITHOUT CONTRAST  (INCLUDING MRCP)  TECHNIQUE: Multiplanar multisequence MR imaging of the abdomen was performed. Heavily T2-weighted images of the biliary and pancreatic ducts were obtained, and three-dimensional MRCP images were rendered by post processing.  COMPARISON:  CTs 04/25/2011, 11/27/2014 and 12/04/2014.  FINDINGS: Study is moderately motion degraded due to the patient's inability to suspend respiration.  Lower chest: There are small bilateral pleural effusions and associated bibasilar atelectasis which appear mildly worse.  Hepatobiliary: No focal hepatic abnormalities are identified. There is mild intra and extrahepatic biliary dilatation. The common hepatic duct measures up to 13 mm in diameter. There are small dependent filling defects within the distal common bile duct which appear somewhat linear on the reformatted images, favored to reflect several small intraductal stones, best seen on series 5.  Pancreas: Atrophy. No evidence of pancreatic mass, pancreatic ductal dilatation or surrounding inflammation.  Spleen: Normal in size without focal abnormality.  Adrenals/Urinary Tract: Stable mild left adrenal hyperplasia. Both kidneys demonstrate cortical thinning, but no hydronephrosis or suspicious focal abnormality.  Stomach/Bowel: Mild distension of  the colon with fluid and air appears unchanged. No bowel wall thickening or focal inflammatory change identified.  Vascular/Lymphatic: There are no enlarged abdominal lymph nodes. Diffuse atherosclerosis better demonstrated by CT.  Other: There is diffuse muscular atrophy. There is mild generalized soft tissue edema with asymmetric flank edema on the right. Calcified breast lesion appears unchanged.  Musculoskeletal: No acute or significant osseous findings.  IMPRESSION: 1. Mild biliary dilatation status post cholecystectomy. Small filling defects within the distal common bile duct are suspicious for choledocholithiasis. 2. No evidence of pancreatic mass or pancreatic ductal dilatation. 3. Enlarging small bilateral pleural effusions with diffuse soft tissue edema suspicious for anasarca. 4. Stable renal cortical thinning and left adrenal hyperplasia.   Electronically Signed   By: Richardean Sale M.D.   On: 12/06/2014 10:42   Mr 3d Recon At Scanner  12/06/2014   CLINICAL DATA:  Biliary dilatation status post cholecystectomy. Possible biliary sepsis. Acute encephalopathy and renal insufficiency. Initial encounter.  EXAM: MRI ABDOMEN WITHOUT CONTRAST  (INCLUDING MRCP)  TECHNIQUE: Multiplanar multisequence MR imaging of the abdomen was performed. Heavily T2-weighted images of the biliary and pancreatic ducts were obtained, and three-dimensional MRCP images were rendered by post processing.  COMPARISON:  CTs 04/25/2011, 11/27/2014 and 12/04/2014.  FINDINGS: Study is moderately motion degraded due to the patient's inability to suspend respiration.  Lower chest: There are small bilateral pleural effusions and associated bibasilar atelectasis which appear mildly worse.  Hepatobiliary: No focal hepatic abnormalities are identified. There is mild intra and extrahepatic biliary dilatation. The common hepatic duct measures up to 13 mm in diameter. There are small dependent filling defects within the distal common bile duct  which appear somewhat linear on the reformatted images, favored to reflect several small intraductal stones, best seen on series 5.  Pancreas: Atrophy. No evidence of pancreatic mass, pancreatic ductal dilatation or surrounding inflammation.  Spleen: Normal in size without focal abnormality.  Adrenals/Urinary Tract: Stable mild left adrenal hyperplasia. Both kidneys demonstrate cortical thinning, but no hydronephrosis or suspicious focal abnormality.  Stomach/Bowel: Mild distension of the colon with fluid and air appears unchanged. No bowel wall thickening or focal inflammatory change identified.  Vascular/Lymphatic: There are no enlarged abdominal lymph nodes. Diffuse atherosclerosis better demonstrated by CT.  Other: There  is diffuse muscular atrophy. There is mild generalized soft tissue edema with asymmetric flank edema on the right. Calcified breast lesion appears unchanged.  Musculoskeletal: No acute or significant osseous findings.  IMPRESSION: 1. Mild biliary dilatation status post cholecystectomy. Small filling defects within the distal common bile duct are suspicious for choledocholithiasis. 2. No evidence of pancreatic mass or pancreatic ductal dilatation. 3. Enlarging small bilateral pleural effusions with diffuse soft tissue edema suspicious for anasarca. 4. Stable renal cortical thinning and left adrenal hyperplasia.   Electronically Signed   By: Richardean Sale M.D.   On: 12/06/2014 10:42    ROS: No change vision No SOB Says N/V better No abd pain No CP   PHYSICAL EXAM: Blood pressure 116/60, pulse 86, temperature 98.6 F (37 C), temperature source Oral, resp. rate 15, height 5\' 10"  (1.778 m), weight 114 kg (251 lb 5.2 oz), SpO2 100 %. HEENT: PERRLA EOMI   NG tube in place NECK:BNo JVD LUNGS:decreased BS bases but clear CARDIAC:RRR wo MRG ABD:+ BS NTND No HSM KMM:NOTRR of upper extremities.  No edema lower ext.  No pre sacral edema NEURO:CNI  Ox3  Decreased strength in  LE  Assessment: 1. Acute on CKD 3 sec to infection, hypotension, volume depletion in face of ACE.  Scr trending down but UO ?  She is making some urine now. 2. Probable UTI 3. ? Bile duct stones 4. DM PLAN: 1. IV fluids 2. Ab to treat infection   Cultures pending 3. I would never put her on ACE/ARB again as she is extremely frail and debilitated and her risk for ARF is high 4. Daily Scr   Chanee Henrickson T 12/07/2014, 9:22 AM

## 2014-12-07 NOTE — Progress Notes (Signed)
Multiple CBD stones were removed following sphincterotomy.  Recommend  1.  Continue antibiotics 2.  Advance diet as tolerated

## 2014-12-07 NOTE — Transfer of Care (Deleted)
Immediate Anesthesia Transfer of Care Note  Patient: Kathryn [name]  Procedure(s) Performed: Procedure(s): ENDOSCOPIC RETROGRADE CHOLANGIOPANCREATOGRAPHY (ERCP) (N/A)  Patient Location: PACU  Anesthesia Type:General  Level of Consciousness:  sedated, patient cooperative and responds to stimulation  Airway & Oxygen Therapy:Patient Spontanous Breathing and Patient connected to face mask oxgen  Post-op Assessment:  Report given to PACU RN and Post -op Vital signs reviewed and stable  Post vital signs:  Reviewed and stable  Last Vitals:  Filed Vitals:   12/07/14 1744  BP: 137/72  Pulse:   Temp:   Resp:     Complications: No apparent anesthesia complications

## 2014-12-08 LAB — CBC
HEMATOCRIT: 28.1 % — AB (ref 36.0–46.0)
Hemoglobin: 9.3 g/dL — ABNORMAL LOW (ref 12.0–15.0)
MCH: 33.7 pg (ref 26.0–34.0)
MCHC: 33.1 g/dL (ref 30.0–36.0)
MCV: 101.8 fL — AB (ref 78.0–100.0)
PLATELETS: 205 10*3/uL (ref 150–400)
RBC: 2.76 MIL/uL — AB (ref 3.87–5.11)
RDW: 13.3 % (ref 11.5–15.5)
WBC: 9.8 10*3/uL (ref 4.0–10.5)

## 2014-12-08 LAB — COMPREHENSIVE METABOLIC PANEL
ALT: 18 U/L (ref 14–54)
AST: 24 U/L (ref 15–41)
Albumin: 2 g/dL — ABNORMAL LOW (ref 3.5–5.0)
Alkaline Phosphatase: 283 U/L — ABNORMAL HIGH (ref 38–126)
Anion gap: 6 (ref 5–15)
BUN: 59 mg/dL — AB (ref 6–20)
CHLORIDE: 104 mmol/L (ref 101–111)
CO2: 23 mmol/L (ref 22–32)
CREATININE: 2.94 mg/dL — AB (ref 0.44–1.00)
Calcium: 7.4 mg/dL — ABNORMAL LOW (ref 8.9–10.3)
GFR calc Af Amer: 16 mL/min — ABNORMAL LOW (ref >60)
GFR, EST NON AFRICAN AMERICAN: 14 mL/min — AB (ref >60)
Glucose, Bld: 271 mg/dL — ABNORMAL HIGH (ref 65–99)
Potassium: 3.3 mmol/L — ABNORMAL LOW (ref 3.5–5.1)
Sodium: 133 mmol/L — ABNORMAL LOW (ref 135–145)
Total Bilirubin: 1 mg/dL (ref 0.3–1.2)
Total Protein: 4.7 g/dL — ABNORMAL LOW (ref 6.5–8.1)

## 2014-12-08 LAB — LACTIC ACID, PLASMA
Lactic Acid, Venous: 1.6 mmol/L (ref 0.5–2.0)
Lactic Acid, Venous: 1.4 mmol/L (ref 0.5–2.0)

## 2014-12-08 LAB — GLUCOSE, CAPILLARY
GLUCOSE-CAPILLARY: 269 mg/dL — AB (ref 65–99)
GLUCOSE-CAPILLARY: 182 mg/dL — AB (ref 65–99)
GLUCOSE-CAPILLARY: 172 mg/dL — AB (ref 65–99)
Glucose-Capillary: 280 mg/dL — ABNORMAL HIGH (ref 65–99)

## 2014-12-08 LAB — URINE CULTURE: CULTURE: NO GROWTH

## 2014-12-08 MED ORDER — PANTOPRAZOLE SODIUM 40 MG IV SOLR
40.0000 mg | Freq: Two times a day (BID) | INTRAVENOUS | Status: DC
  Administered 2014-12-08 – 2014-12-11 (×6): 40 mg via INTRAVENOUS
  Filled 2014-12-08 (×6): qty 40

## 2014-12-08 MED ORDER — SODIUM CHLORIDE 0.9 % IV SOLN
INTRAVENOUS | Status: DC
  Administered 2014-12-08 – 2014-12-09 (×2): via INTRAVENOUS

## 2014-12-08 MED ORDER — POTASSIUM CHLORIDE CRYS ER 20 MEQ PO TBCR
20.0000 meq | EXTENDED_RELEASE_TABLET | Freq: Once | ORAL | Status: AC
  Administered 2014-12-08: 20 meq via ORAL
  Filled 2014-12-08: qty 1

## 2014-12-08 MED ORDER — CHLORHEXIDINE GLUCONATE 0.12 % MT SOLN
15.0000 mL | Freq: Two times a day (BID) | OROMUCOSAL | Status: DC
Start: 2014-12-08 — End: 2014-12-13
  Administered 2014-12-09 – 2014-12-13 (×6): 15 mL via OROMUCOSAL
  Filled 2014-12-08 (×7): qty 15

## 2014-12-08 MED ORDER — SACCHAROMYCES BOULARDII 250 MG PO CAPS
250.0000 mg | ORAL_CAPSULE | Freq: Two times a day (BID) | ORAL | Status: DC
  Administered 2014-12-09 – 2014-12-13 (×7): 250 mg via ORAL
  Filled 2014-12-08 (×9): qty 1

## 2014-12-08 MED ORDER — ALBUMIN HUMAN 25 % IV SOLN
25.0000 g | Freq: Once | INTRAVENOUS | Status: AC
  Administered 2014-12-08: 25 g via INTRAVENOUS
  Filled 2014-12-08: qty 50

## 2014-12-08 MED ORDER — CETYLPYRIDINIUM CHLORIDE 0.05 % MT LIQD
7.0000 mL | Freq: Two times a day (BID) | OROMUCOSAL | Status: DC
  Administered 2014-12-09 – 2014-12-12 (×8): 7 mL via OROMUCOSAL

## 2014-12-08 MED ORDER — METRONIDAZOLE IN NACL 5-0.79 MG/ML-% IV SOLN
500.0000 mg | Freq: Three times a day (TID) | INTRAVENOUS | Status: DC
Start: 2014-12-08 — End: 2014-12-08
  Filled 2014-12-08: qty 100

## 2014-12-08 NOTE — Progress Notes (Signed)
[facility] Progress Note  Subjective:  S/P ERCP yesterday with removal of multiple stones following sphincterotomy. Alk phos today 283 from 406. T bili 1.0, transaminases nl.WBC 9.8. Had diarrhea, c diff antigen positive--was started on flagyl.  Objective:  Vital signs in last 24 hours: Temp:  [97.7 F (36.5 C)-98.6 F (37 C)] 98.4 F (36.9 C) (10/02 0800) Pulse Rate:  [31-101] 64 (10/02 0700) Resp:  [9-27] 9 (10/02 0700) BP: (92-138)/(39-107) 94/46 mmHg (10/02 0700) SpO2:  [89 %-100 %] 100 % (10/02 0700) Weight:  [253 lb 8.5 oz (115 kg)] 253 lb 8.5 oz (115 kg) (10/02 0400) Last BM Date: 12/07/14 General:   Alert,  Well-developed,    in NAD Heart:  Regular rate and rhythm; no murmurs Pulm;lungs clear Abdomen:  Soft, nontender and nondistended. Normal bowel sounds, without guarding, and without rebound.   Extremities:  Edema UE   Intake/Output from previous day: 10/01 0701 - 10/02 0700 In: 3560 [I.V.:3330; NG/GT:30; IV Piggyback:200] Out: 540 [Urine:540] Intake/Output this shift: Total I/O In: 200 [I.V.:200] Out: -   Lab Results:  Recent Labs  12/06/14 0434 12/07/14 0405 12/08/14 0643  WBC 17.3* 14.9* 9.8  HGB 10.5* 10.7* 9.3*  HCT 31.3* 32.8* 28.1*  PLT 251 260 205   BMET  Recent Labs  12/06/14 0434 12/07/14 0405 12/08/14 0643  NA 134* 137 133*  K 4.0 4.2 3.3*  CL 105 102 104  CO2 16* 22 23  GLUCOSE 148* 239* 271*  BUN 61* 60* 59*  CREATININE 3.82* 3.48* 2.94*  CALCIUM 8.6* 8.6* 7.4*   LFT  Recent Labs  12/06/14 0840  12/08/14 0643  PROT 5.6*  < > 4.7*  ALBUMIN 2.4*  < > 2.0*  AST 29  < > 24  ALT 29  < > 18  ALKPHOS 414*  < > 283*  BILITOT 1.1  < > 1.0  BILIDIR 0.3  --   --   IBILI 0.8  --   --   < > = values in this interval not displayed.  Dg Ercp Biliary & Pancreatic Ducts  12/07/2014   CLINICAL DATA:  Choledocholithiasis.  EXAM: ERCP with sphincterotomy  TECHNIQUE: Multiple spot images obtained with the  fluoroscopic device and submitted for interpretation post-procedure.  FLUOROSCOPY TIME:  Fluoroscopy Time:  2 minutes 6 seconds  Number of Acquired Images:  14  COMPARISON:  MRI abdomen/MRCP from 12/06/2014.  FINDINGS: Initial contrast-enhanced images of the common bile duct demonstrate multiple small ovoid filling defects in the mid to distal common bile duct with dilated proximal common bile duct. Multiple balloon sweeps were performed of the common bile duct. The final provided post balloon sweep images of the common bile duct demonstrate no definite residual filling defects in the common bile duct.  IMPRESSION: Multiple small ovoid filling defects in the dilated mid to distal common bile duct, in keeping with choledocholithiasis. No residual filling defects in the common bile duct status post balloon sweep.  These images were submitted for radiologic interpretation only. Please see the procedural report for the amount of contrast and the fluoroscopy time utilized.   Electronically Signed   By: Delbert Phenix M.D.   On: 12/07/2014 19:53    ASSESSMENT/PLAN:   81 year old female admitted with nausea, vomiting, and coffee ground emesis. Patient has also been noted to have abnormal liver functions. CT of the abdomen and pelvis was negative for acute findings.MRCP had h filling defects CBD suspicious for choledocholithiasis.S/P ERCP yesterday with sphincterotomy and  removal of stones. LFTs improving. Now C diff antigen positive--was started on flagyl. Will add florastor.      LOS: 4 days   Troye Hiemstra, Vita Barley PA-C 12/08/2014, Pager [phone]

## 2014-12-08 NOTE — Progress Notes (Signed)
S: Says she feels better O:BP 94/46 mmHg  Pulse 64  Temp(Src) 98.4 F (36.9 C) (Oral)  Resp 9  Ht 5\' 10"  (1.778 m)  Wt 115 kg (253 lb 8.5 oz)  BMI 36.38 kg/m2  SpO2 100%  Intake/Output Summary (Last 24 hours) at 12/08/14 1032 Last data filed at 12/08/14 0700  Gross per 24 hour  Intake   3560 ml  Output    480 ml  Net   3080 ml   Weight change: 1 kg (2 lb 3.3 oz) NKN:LZJQB and alert though doesn't remember me from yest CVS:RRR Resp:clear ant Abd:+ BS NTND Ext:No edema in legs.  + edema of arms NEURO: CNI, no asterixis   . antiseptic oral rinse  7 mL Mouth Rinse BID  . beta carotene w/minerals  1 tablet Oral BID  . calcium-vitamin D  1 tablet Oral BID  . Chlorhexidine Gluconate Cloth  6 each Topical Q0600  . cholecalciferol  400 Units Oral Daily  . feeding supplement (ENSURE ENLIVE)  237 mL Oral BID BM  . gabapentin  300 mg Oral QHS  . imipenem-cilastatin  250 mg Intravenous Q12H  . insulin aspart  0-9 Units Subcutaneous TID WC  . metoprolol tartrate  25 mg Oral BID  . metronidazole  500 mg Intravenous Q8H  . multivitamin with minerals  1 tablet Oral Daily  . mupirocin ointment  1 application Nasal BID  . sodium chloride  1,000 mL Intravenous Once  . sodium chloride  3 mL Intravenous Q12H   Dg Ercp Biliary & Pancreatic Ducts  12/07/2014   CLINICAL DATA:  Choledocholithiasis.  EXAM: ERCP with sphincterotomy  TECHNIQUE: Multiple spot images obtained with the fluoroscopic device and submitted for interpretation post-procedure.  FLUOROSCOPY TIME:  Fluoroscopy Time:  2 minutes 6 seconds  Number of Acquired Images:  14  COMPARISON:  MRI abdomen/MRCP from 12/06/2014.  FINDINGS: Initial contrast-enhanced images of the common bile duct demonstrate multiple small ovoid filling defects in the mid to distal common bile duct with dilated proximal common bile duct. Multiple balloon sweeps were performed of the common bile duct. The final provided post balloon sweep images of the common  bile duct demonstrate no definite residual filling defects in the common bile duct.  IMPRESSION: Multiple small ovoid filling defects in the dilated mid to distal common bile duct, in keeping with choledocholithiasis. No residual filling defects in the common bile duct status post balloon sweep.  These images were submitted for radiologic interpretation only. Please see the procedural report for the amount of contrast and the fluoroscopy time utilized.   Electronically Signed   By: Ilona Sorrel M.D.   On: 12/07/2014 19:53   BMET    Component Value Date/Time   NA 133* 12/08/2014 0643   NA 139 08/14/2014   K 3.3* 12/08/2014 0643   CL 104 12/08/2014 0643   CO2 23 12/08/2014 0643   GLUCOSE 271* 12/08/2014 0643   BUN 59* 12/08/2014 0643   BUN 43* 08/14/2014   CREATININE 2.94* 12/08/2014 0643   CREATININE 1.4* 08/14/2014   CALCIUM 7.4* 12/08/2014 0643   CALCIUM 8.2* 10/10/2010 1613   GFRNONAA 14* 12/08/2014 0643   GFRAA 16* 12/08/2014 0643   CBC    Component Value Date/Time   WBC 9.8 12/08/2014 0643   WBC 4.8 08/14/2014   WBC 9.1 07/22/2011 1517   RBC 2.76* 12/08/2014 0643   RBC 3.65* 07/22/2011 1517   RBC 2.00* 10/11/2010 0500   HGB 9.3* 12/08/2014 3419  HGB 11.7 07/22/2011 1517   HCT 28.1* 12/08/2014 0643   HCT 33.4* 07/22/2011 1517   PLT 205 12/08/2014 0643   PLT 246 07/22/2011 1517   MCV 101.8* 12/08/2014 0643   MCV 91.5 07/22/2011 1517   MCH 33.7 12/08/2014 0643   MCH 32.1 07/22/2011 1517   MCHC 33.1 12/08/2014 0643   MCHC 35.0 07/22/2011 1517   RDW 13.3 12/08/2014 0643   RDW 14.6* 07/22/2011 1517   LYMPHSABS 0.6* 12/04/2014 1804   LYMPHSABS 1.2 07/22/2011 1517   MONOABS 0.4 12/04/2014 1804   MONOABS 0.6 07/22/2011 1517   EOSABS 0.0 12/04/2014 1804   EOSABS 0.5 07/22/2011 1517   BASOSABS 0.0 12/04/2014 1804   BASOSABS 0.1 07/22/2011 1517     Assessment: 1. Acute on CKD 3 sec to infection, hypotension, volume depletion in face of ARB.  Renal fx improving though  UO a little marginal 2. Bile duct stones SP removal and spincterotomy 3.  DM 4. UTI 5. Hypokalemia  Plan: 1. Cont IV fluids 2. Replace K 3. Daily Scr 4.  Again, she should never be put back on ACE/ARB   Kendalynn Wideman T

## 2014-12-08 NOTE — Progress Notes (Signed)
[facility] PROGRESS NOTE  NEIVA MAENZA DXA:[id number] DOB: 1933-07-11 DOA: 12/04/2014 PCP: Oneal Grout, MD  Assessment/Plan: Kathryn [name] is a 81 y.o. female with PMH of bedbound, , hypertension, diabetes mellitus, GERD, depression, atrial fibrillation not on anticoagulant (unclear reason), recurrent UTI, diastolic congestive heart failure, CKD-III, who admitted because for nausea, vomiting, constipation, acute encephalopathy.  Sepsis: Likely due  UTI. Patient has been treated with Keflex for positive urinalysis with Escherichia coli, which are resistant to lactamase. -Continue with  IV Vancomycin and Primaxin -follow up Bx and Ux -Lactic acid trending down.  -will discontinue vancomycin.   UTI: -E coli from culture 9-24, multiresistant, sensitive to imipenem.   Transaminases:  -GTT elevated, Alk phosphatase elevated  -RUQ [place]: No acute findings. No biliary dilatation status post cholecystectomy. -GI consulted. MRCP with filling defect. S/P ERCP, extraction with multiple stones.   Acute encephalopathy: She is mildly confused, but oriented 3. Likely due to sepsis and UTI. -Treat UTI and sepsis as above -ammonia level at 11.  -Changed oxycodone to PRN. -hold lactulose, due to diarrhea.   Nausea and vomiting: Etiology is not clear, likely due to UTI and sepsis. CT abdomen/pelvis is negative for acute abnormalities.  -IVF as above -prn Zofran for nausea, morphine for pain - lipase at 12. Remove NG tube today/   AoCKD-III: Baseline Cr is 1.4 , her Cr is 4.49 on admission. Hemodynamic related, hypotension, infection, sepsis, also use of ACE.  - Hold lisinopril.  -cr trending down. Toady at 2.9 -foley catheter place 9-30.  -appreciate nephrologist assistance.  -replete K.   Diarrhea; after laxative, lactulose. C diff antigen positive. Will start IV flagyl. Await GI recommendation.   Possible UGI: Nurse noticed coffee-ground material from her vomited gastric  materials.Differential : acute gastritis or stress ulcers. Patient does not have history of cirrhosis. Her hemoglobin is 12.6 on 11/29/14-->12.0.  - continue IV Protonix - Avoid NSAIDs and SQ heparin -Hb stable.  -GI consulted. No need for endoscopy.   Atrial Fibrillation: CHA2DS2-VASc Score is 6, needs oral anticoagulation. Patient is not on AC at home (unclear reason), now presents with possible GIB, should not start anticoagulants.  -resume metoprolol, lower dose on 9-29.   DM-II: Last A1c 5.8 on 06/04/14.  -SSI  HTN: -Hold amlodipine and lisinopril since patient blood pressure is a soft due to sepsis  Protein-calorie malnutrition: -Ensure when pt is able to eat.  Chronic diastolic CHF (congestive heart failure): compensated.  - BNP; 167 -on metoprolol lower dose.   Decubitus ulcer-stage 3: -Wound care consulted.    Code Status: DNR Family Communication: care discussed with son  Disposition Plan: Remain in the step down unit.   Consultants:  GI  Procedures:  Abdominal US  Antibiotics:  Vancomycin  Imipenem/   HPI/Subjective: Alert, oriented to place. Denies abdominal pain. Wants to eat.  Per report patient had multiple BM overnight. C diff send, antigen positive.   Objective: Filed Vitals:   12/08/14 0700  BP: 94/46  Pulse: 64  Temp:   Resp: 9    Intake/Output Summary (Last 24 hours) at 12/08/14 0754 Last data filed at 12/08/14 0700  Gross per 24 hour  Intake   3560 ml  Output    540 ml  Net   3020 ml   Filed Weights   12/04/14 2230 12/07/14 0103 12/08/14 0400  Weight: 105.6 kg (232 lb 12.9 oz) 114 kg (251 lb 5.2 oz) 115 kg (253 lb 8.5 oz)    Exam:  General:  Alert in no distress. NG tube in place.   Cardiovascular: S 1, S 2 IRR  Respiratory: CTA  Abdomen: BS present, mildly distended.   Musculoskeletal: trace edema  Data Reviewed: Basic Metabolic Panel:  Recent Labs Lab 12/04/14 1804 12/05/14 0325 12/06/14 0434  12/07/14 0405 12/08/14 0643  NA 133* 133* 134* 137 133*  K 5.0 5.0 4.0 4.2 3.3*  CL 95* 101 105 102 104  CO2 21* 18* 16* 22 23  GLUCOSE 242* 231* 148* 239* 271*  BUN 60* 59* 61* 60* 59*  CREATININE 4.49* 4.15* 3.82* 3.48* 2.94*  CALCIUM 10.7* 9.0 8.6* 8.6* 7.4*   Liver Function Tests:  Recent Labs Lab 12/04/14 1804 12/05/14 0325 12/06/14 0840 12/07/14 0405 12/08/14 0643  AST 53* 39 $Remo'29 24 24  'ORUiQ$ ALT 48 37 $Rem'29 27 18  'bqoF$ ALKPHOS 665* 519* 414* 406* 283*  BILITOT 1.4* 1.4* 1.1 1.3* 1.0  PROT 7.0 5.8* 5.6* 5.8* 4.7*  ALBUMIN 3.2* 2.7* 2.4* 2.6* 2.0*    Recent Labs Lab 12/05/14 0130 12/06/14 0840  LIPASE 12* 18*    Recent Labs Lab 12/04/14 1805  AMMONIA 11   CBC:  Recent Labs Lab 12/04/14 1804  12/05/14 0325 12/05/14 0937 12/06/14 0434 12/07/14 0405 12/08/14 0643  WBC 21.2*  < > 15.7* 14.6* 17.3* 14.9* 9.8  NEUTROABS 20.2*  --   --   --   --   --   --   HGB 12.0  < > 10.2* 10.5* 10.5* 10.7* 9.3*  HCT 35.5*  < > 31.0* 31.9* 31.3* 32.8* 28.1*  MCV 100.0  < > 101.3* 103.2* 100.0 100.9* 101.8*  PLT 299  < > 239 281 251 260 205  < > = values in this interval not displayed. Cardiac Enzymes: No results for input(s): CKTOTAL, CKMB, CKMBINDEX, TROPONINI in the last 168 hours. BNP (last 3 results)  Recent Labs  12/05/14 0325  BNP 167.5*    ProBNP (last 3 results) No results for input(s): PROBNP in the last 8760 hours.  CBG:  Recent Labs Lab 12/06/14 1641 12/06/14 1938 12/07/14 0835 12/07/14 1346 12/07/14 2100  GLUCAP 189* 211* 214* 194* 216*    Recent Results (from the past 240 hour(s))  Urine culture     Status: None   Collection Time: 11/30/14 10:30 PM  Result Value Ref Range Status   Specimen Description URINE, CATHETERIZED  Final   Special Requests NONE  Final   Culture   Final    >=100,000 COLONIES/mL ESCHERICHIA COLI Confirmed Extended Spectrum Beta-Lactamase Producer (ESBL) Performed at [facility]    Report Status 12/04/2014 FINAL   Final   Organism ID, Bacteria ESCHERICHIA COLI  Final      Susceptibility   Escherichia coli - MIC*    AMPICILLIN >=32 RESISTANT Resistant     CEFAZOLIN >=64 RESISTANT Resistant     CEFTRIAXONE >=64 RESISTANT Resistant     CIPROFLOXACIN >=4 RESISTANT Resistant     GENTAMICIN >=16 RESISTANT Resistant     IMIPENEM <=0.25 SENSITIVE Sensitive     NITROFURANTOIN <=16 SENSITIVE Sensitive     TRIMETH/SULFA <=20 SENSITIVE Sensitive     AMPICILLIN/SULBACTAM >=32 RESISTANT Resistant     PIP/TAZO >=128 RESISTANT Resistant     * >=100,000 COLONIES/mL ESCHERICHIA COLI  Blood culture (routine x 2)     Status: None (Preliminary result)   Collection Time: 12/04/14  6:00 PM  Result Value Ref Range Status   Specimen Description BLOOD LEFT HAND  Final   Special Requests  BOTTLES DRAWN AEROBIC AND ANAEROBIC 5CC EACH  Final   Culture   Final    NO GROWTH 3 DAYS Performed at [facility]    Report Status PENDING  Incomplete  Blood culture (routine x 2)     Status: None (Preliminary result)   Collection Time: 12/04/14  6:00 PM  Result Value Ref Range Status   Specimen Description BLOOD RIGHT HAND  Final   Special Requests BOTTLES DRAWN AEROBIC AND ANAEROBIC 5CC EACH  Final   Culture   Final    NO GROWTH 3 DAYS Performed at [facility]    Report Status PENDING  Incomplete  MRSA PCR Screening     Status: Abnormal   Collection Time: 12/04/14 11:16 PM  Result Value Ref Range Status   MRSA by PCR POSITIVE (A) NEGATIVE Final    Comment:        The GeneXpert MRSA Assay (FDA approved for NASAL specimens only), is one component of a comprehensive MRSA colonization surveillance program. It is not intended to diagnose MRSA infection nor to guide or monitor treatment for MRSA infections. RESULT CALLED TO, READ BACK BY AND VERIFIED WITH: NI,A/2W@0315  ON 12/05/14 BY KARCZEWSKI,S.   C difficile quick scan w PCR reflex     Status: Abnormal   Collection Time: 12/07/14  6:00 PM  Result  Value Ref Range Status   C Diff antigen POSITIVE (A) NEGATIVE Final   C Diff toxin NEGATIVE NEGATIVE Final   C Diff interpretation   Final    C. difficile present, but toxin not detected. This indicates colonization. In most cases, this does not require treatment. If patient has signs and symptoms consistent with colitis, consider treatment. Requires ENTERIC precautions.     Studies: Mr Abdomen Mrcp Wo Cm  12/06/2014   CLINICAL DATA:  Biliary dilatation status post cholecystectomy. Possible biliary sepsis. Acute encephalopathy and renal insufficiency. Initial encounter.  EXAM: MRI ABDOMEN WITHOUT CONTRAST  (INCLUDING MRCP)  TECHNIQUE: Multiplanar multisequence MR imaging of the abdomen was performed. Heavily T2-weighted images of the biliary and pancreatic ducts were obtained, and three-dimensional MRCP images were rendered by post processing.  COMPARISON:  CTs 04/25/2011, 11/27/2014 and 12/04/2014.  FINDINGS: Study is moderately motion degraded due to the patient's inability to suspend respiration.  Lower chest: There are small bilateral pleural effusions and associated bibasilar atelectasis which appear mildly worse.  Hepatobiliary: No focal hepatic abnormalities are identified. There is mild intra and extrahepatic biliary dilatation. The common hepatic duct measures up to 13 mm in diameter. There are small dependent filling defects within the distal common bile duct which appear somewhat linear on the reformatted images, favored to reflect several small intraductal stones, best seen on series 5.  Pancreas: Atrophy. No evidence of pancreatic mass, pancreatic ductal dilatation or surrounding inflammation.  Spleen: Normal in size without focal abnormality.  Adrenals/Urinary Tract: Stable mild left adrenal hyperplasia. Both kidneys demonstrate cortical thinning, but no hydronephrosis or suspicious focal abnormality.  Stomach/Bowel: Mild distension of the colon with fluid and air appears unchanged. No bowel  wall thickening or focal inflammatory change identified.  Vascular/Lymphatic: There are no enlarged abdominal lymph nodes. Diffuse atherosclerosis better demonstrated by CT.  Other: There is diffuse muscular atrophy. There is mild generalized soft tissue edema with asymmetric flank edema on the right. Calcified breast lesion appears unchanged.  Musculoskeletal: No acute or significant osseous findings.  IMPRESSION: 1. Mild biliary dilatation status post cholecystectomy. Small filling defects within the distal common bile duct are suspicious  for choledocholithiasis. 2. No evidence of pancreatic mass or pancreatic ductal dilatation. 3. Enlarging small bilateral pleural effusions with diffuse soft tissue edema suspicious for anasarca. 4. Stable renal cortical thinning and left adrenal hyperplasia.   Electronically Signed   By: Richardean Sale M.D.   On: 12/06/2014 10:42   Mr 3d Recon At Scanner  12/06/2014   CLINICAL DATA:  Biliary dilatation status post cholecystectomy. Possible biliary sepsis. Acute encephalopathy and renal insufficiency. Initial encounter.  EXAM: MRI ABDOMEN WITHOUT CONTRAST  (INCLUDING MRCP)  TECHNIQUE: Multiplanar multisequence MR imaging of the abdomen was performed. Heavily T2-weighted images of the biliary and pancreatic ducts were obtained, and three-dimensional MRCP images were rendered by post processing.  COMPARISON:  CTs 04/25/2011, 11/27/2014 and 12/04/2014.  FINDINGS: Study is moderately motion degraded due to the patient's inability to suspend respiration.  Lower chest: There are small bilateral pleural effusions and associated bibasilar atelectasis which appear mildly worse.  Hepatobiliary: No focal hepatic abnormalities are identified. There is mild intra and extrahepatic biliary dilatation. The common hepatic duct measures up to 13 mm in diameter. There are small dependent filling defects within the distal common bile duct which appear somewhat linear on the reformatted images,  favored to reflect several small intraductal stones, best seen on series 5.  Pancreas: Atrophy. No evidence of pancreatic mass, pancreatic ductal dilatation or surrounding inflammation.  Spleen: Normal in size without focal abnormality.  Adrenals/Urinary Tract: Stable mild left adrenal hyperplasia. Both kidneys demonstrate cortical thinning, but no hydronephrosis or suspicious focal abnormality.  Stomach/Bowel: Mild distension of the colon with fluid and air appears unchanged. No bowel wall thickening or focal inflammatory change identified.  Vascular/Lymphatic: There are no enlarged abdominal lymph nodes. Diffuse atherosclerosis better demonstrated by CT.  Other: There is diffuse muscular atrophy. There is mild generalized soft tissue edema with asymmetric flank edema on the right. Calcified breast lesion appears unchanged.  Musculoskeletal: No acute or significant osseous findings.  IMPRESSION: 1. Mild biliary dilatation status post cholecystectomy. Small filling defects within the distal common bile duct are suspicious for choledocholithiasis. 2. No evidence of pancreatic mass or pancreatic ductal dilatation. 3. Enlarging small bilateral pleural effusions with diffuse soft tissue edema suspicious for anasarca. 4. Stable renal cortical thinning and left adrenal hyperplasia.   Electronically Signed   By: Richardean Sale M.D.   On: 12/06/2014 10:42   Dg Ercp Biliary & Pancreatic Ducts  12/07/2014   CLINICAL DATA:  Choledocholithiasis.  EXAM: ERCP with sphincterotomy  TECHNIQUE: Multiple spot images obtained with the fluoroscopic device and submitted for interpretation post-procedure.  FLUOROSCOPY TIME:  Fluoroscopy Time:  2 minutes 6 seconds  Number of Acquired Images:  14  COMPARISON:  MRI abdomen/MRCP from 12/06/2014.  FINDINGS: Initial contrast-enhanced images of the common bile duct demonstrate multiple small ovoid filling defects in the mid to distal common bile duct with dilated proximal common bile duct.  Multiple balloon sweeps were performed of the common bile duct. The final provided post balloon sweep images of the common bile duct demonstrate no definite residual filling defects in the common bile duct.  IMPRESSION: Multiple small ovoid filling defects in the dilated mid to distal common bile duct, in keeping with choledocholithiasis. No residual filling defects in the common bile duct status post balloon sweep.  These images were submitted for radiologic interpretation only. Please see the procedural report for the amount of contrast and the fluoroscopy time utilized.   Electronically Signed   By: Ilona Sorrel M.D.   On:  12/07/2014 19:53    Scheduled Meds: . antiseptic oral rinse  7 mL Mouth Rinse BID  . beta carotene w/minerals  1 tablet Oral BID  . calcium-vitamin D  1 tablet Oral BID  . Chlorhexidine Gluconate Cloth  6 each Topical Q0600  . cholecalciferol  400 Units Oral Daily  . feeding supplement (ENSURE ENLIVE)  237 mL Oral BID BM  . gabapentin  300 mg Oral QHS  . imipenem-cilastatin  250 mg Intravenous Q12H  . insulin aspart  0-9 Units Subcutaneous TID WC  . metoprolol tartrate  25 mg Oral BID  . metronidazole  500 mg Intravenous Q8H  . multivitamin with minerals  1 tablet Oral Daily  . mupirocin ointment  1 application Nasal BID  . potassium chloride  20 mEq Oral Once  . sodium chloride  1,000 mL Intravenous Once  . sodium chloride  3 mL Intravenous Q12H  . vancomycin  1,500 mg Intravenous Q48H   Continuous Infusions: . dextrose 5 % and 0.45% NaCl 75 mL/hr at 12/08/14 0700  . lactated ringers    . pantoprozole (PROTONIX) infusion 8 mg/hr (12/08/14 0700)    Principal Problem:   Sepsis ([place]) Active Problems:   Macular degeneration (senile) of retina   Essential hypertension   Atrial fibrillation (HCC)   Osteoporosis   GERD (gastroesophageal reflux disease)   DM (diabetes mellitus), type 2 with renal complications (HCC)   Acute renal failure superimposed on stage 3  chronic kidney disease (HCC)   Protein-calorie malnutrition (HCC)   Pressure ulcer, stage 3 (HCC)   Type 2 diabetes mellitus with diabetic polyneuropathy (HCC)   Constipation, chronic   Major depression, chronic (HCC)   Chronic diastolic CHF (congestive heart failure) (HCC)   UTI (lower urinary tract infection)   UGI bleed   Nausea & vomiting   Acute encephalopathy   Decubitus ulcer of ankle, stage 3 (HCC)   GI hemorrhage   Abnormal LFTs   Choledocholithiasis    Time spent: 25 minutes.     Niel Hummer A  [facility] Pager [phone]. If 7PM-7AM, please contact night-coverage at [email or web address], password [facility] 12/08/2014, 7:54 AM  LOS: 4 days

## 2014-12-09 DIAGNOSIS — K8051 Calculus of bile duct without cholangitis or cholecystitis with obstruction: Secondary | ICD-10-CM

## 2014-12-09 DIAGNOSIS — R7989 Other specified abnormal findings of blood chemistry: Secondary | ICD-10-CM

## 2014-12-09 DIAGNOSIS — K8050 Calculus of bile duct without cholangitis or cholecystitis without obstruction: Secondary | ICD-10-CM | POA: Insufficient documentation

## 2014-12-09 LAB — COMPREHENSIVE METABOLIC PANEL
ALBUMIN: 2.5 g/dL — AB (ref 3.5–5.0)
ALT: 13 U/L — ABNORMAL LOW (ref 14–54)
ANION GAP: 9 (ref 5–15)
AST: 25 U/L (ref 15–41)
Alkaline Phosphatase: 279 U/L — ABNORMAL HIGH (ref 38–126)
BUN: 56 mg/dL — AB (ref 6–20)
CHLORIDE: 104 mmol/L (ref 101–111)
CO2: 20 mmol/L — ABNORMAL LOW (ref 22–32)
Calcium: 7.6 mg/dL — ABNORMAL LOW (ref 8.9–10.3)
Creatinine, Ser: 2.68 mg/dL — ABNORMAL HIGH (ref 0.44–1.00)
GFR calc Af Amer: 18 mL/min — ABNORMAL LOW (ref >60)
GFR calc non Af Amer: 16 mL/min — ABNORMAL LOW (ref >60)
GLUCOSE: 168 mg/dL — AB (ref 65–99)
POTASSIUM: 3.7 mmol/L (ref 3.5–5.1)
SODIUM: 133 mmol/L — AB (ref 135–145)
TOTAL PROTEIN: 5.1 g/dL — AB (ref 6.5–8.1)
Total Bilirubin: 0.8 mg/dL (ref 0.3–1.2)

## 2014-12-09 LAB — GLUCOSE, CAPILLARY
GLUCOSE-CAPILLARY: 150 mg/dL — AB (ref 65–99)
GLUCOSE-CAPILLARY: 185 mg/dL — AB (ref 65–99)
Glucose-Capillary: 189 mg/dL — ABNORMAL HIGH (ref 65–99)

## 2014-12-09 LAB — CULTURE, BLOOD (ROUTINE X 2)
CULTURE: NO GROWTH
Culture: NO GROWTH

## 2014-12-09 LAB — CBC
HCT: 29.3 % — ABNORMAL LOW (ref 36.0–46.0)
Hemoglobin: 9.6 g/dL — ABNORMAL LOW (ref 12.0–15.0)
MCH: 33.4 pg (ref 26.0–34.0)
MCHC: 32.8 g/dL (ref 30.0–36.0)
MCV: 102.1 fL — ABNORMAL HIGH (ref 78.0–100.0)
PLATELETS: 176 10*3/uL (ref 150–400)
RBC: 2.87 MIL/uL — ABNORMAL LOW (ref 3.87–5.11)
RDW: 13.2 % (ref 11.5–15.5)
WBC: 8.6 10*3/uL (ref 4.0–10.5)

## 2014-12-09 MED ORDER — FUROSEMIDE 40 MG PO TABS
80.0000 mg | ORAL_TABLET | Freq: Three times a day (TID) | ORAL | Status: DC
  Administered 2014-12-09 – 2014-12-10 (×3): 80 mg via ORAL
  Filled 2014-12-09 (×3): qty 2

## 2014-12-09 MED ORDER — IMIPENEM-CILASTATIN 250 MG IV SOLR
250.0000 mg | Freq: Four times a day (QID) | INTRAVENOUS | Status: DC
  Administered 2014-12-09 – 2014-12-13 (×15): 250 mg via INTRAVENOUS
  Filled 2014-12-09 (×19): qty 250

## 2014-12-09 MED ORDER — ENSURE ENLIVE PO LIQD
237.0000 mL | Freq: Three times a day (TID) | ORAL | Status: DC
  Administered 2014-12-09 (×3): 237 mL via ORAL

## 2014-12-09 MED ORDER — METRONIDAZOLE 500 MG PO TABS
500.0000 mg | ORAL_TABLET | Freq: Three times a day (TID) | ORAL | Status: DC
  Administered 2014-12-09: 500 mg via ORAL
  Filled 2014-12-09: qty 1

## 2014-12-09 NOTE — Progress Notes (Signed)
Date:  Oct. 03, 2016 U.R. performed for needs and level of care. Will continue to follow for Case Management needs.  Rhonda Davis, RN, BSN, CCM   [phone] 

## 2014-12-09 NOTE — Progress Notes (Signed)
ANTIBIOTIC CONSULT NOTE - FOLLOW UP  Pharmacy Consult for Primaxin Indication: ESBL UTI, sepsis  Allergies  Allergen Reactions  . Celebrex [Celecoxib]   . Nsaids   . Sulfa Antibiotics     Patient Measurements: Height: 5\' 10"  (177.8 cm) Weight: 261 lb 0.4 oz (118.4 kg) IBW/kg (Calculated) : 68.5  Vital Signs: Temp: 97.4 F (36.3 C) (10/03 0800) Temp Source: Oral (10/03 0800) BP: 99/48 mmHg (10/03 0600) Pulse Rate: 52 (10/03 0600) Intake/Output from previous day: 10/02 0701 - 10/03 0700 In: 1451.7 [P.O.:120; I.V.:1231.7; IV Piggyback:100] Out: 600 [Urine:600] Intake/Output from this shift:    Labs:  Recent Labs  12/06/14 2348 12/07/14 0405 12/08/14 0643 12/09/14 0345  WBC  --  14.9* 9.8 8.6  HGB  --  10.7* 9.3* 9.6*  PLT  --  260 205 176  LABCREA 121.92  --   --   --   CREATININE  --  3.48* 2.94* 2.68*   Estimated Creatinine Clearance: 23.8 mL/min (by C-G formula based on Cr of 2.68).  Recent Labs  12/06/14 2344  [facility] 19     Microbiology: Recent Results (from the past 720 hour(s))  Urine culture     Status: None   Collection Time: 11/30/14 10:30 PM  Result Value Ref Range Status   Specimen Description URINE, CATHETERIZED  Final   Special Requests NONE  Final   Culture   Final    >=100,000 COLONIES/mL ESCHERICHIA COLI Confirmed Extended Spectrum Beta-Lactamase Producer (ESBL) Performed at [facility]    Report Status 12/04/2014 FINAL  Final   Organism ID, Bacteria ESCHERICHIA COLI  Final      Susceptibility   Escherichia coli - MIC*    AMPICILLIN >=32 RESISTANT Resistant     CEFAZOLIN >=64 RESISTANT Resistant     CEFTRIAXONE >=64 RESISTANT Resistant     CIPROFLOXACIN >=4 RESISTANT Resistant     GENTAMICIN >=16 RESISTANT Resistant     IMIPENEM <=0.25 SENSITIVE Sensitive     NITROFURANTOIN <=16 SENSITIVE Sensitive     TRIMETH/SULFA <=20 SENSITIVE Sensitive     AMPICILLIN/SULBACTAM >=32 RESISTANT Resistant     PIP/TAZO >=128  RESISTANT Resistant     * >=100,000 COLONIES/mL ESCHERICHIA COLI  Blood culture (routine x 2)     Status: None (Preliminary result)   Collection Time: 12/04/14  6:00 PM  Result Value Ref Range Status   Specimen Description BLOOD LEFT HAND  Final   Special Requests BOTTLES DRAWN AEROBIC AND ANAEROBIC 5CC EACH  Final   Culture   Final    NO GROWTH 4 DAYS Performed at [facility]    Report Status PENDING  Incomplete  Blood culture (routine x 2)     Status: None (Preliminary result)   Collection Time: 12/04/14  6:00 PM  Result Value Ref Range Status   Specimen Description BLOOD RIGHT HAND  Final   Special Requests BOTTLES DRAWN AEROBIC AND ANAEROBIC 5CC EACH  Final   Culture   Final    NO GROWTH 4 DAYS Performed at [facility]    Report Status PENDING  Incomplete  MRSA PCR Screening     Status: Abnormal   Collection Time: 12/04/14 11:16 PM  Result Value Ref Range Status   MRSA by PCR POSITIVE (A) NEGATIVE Final    Comment:        The GeneXpert MRSA Assay (FDA approved for NASAL specimens only), is one component of a comprehensive MRSA colonization surveillance program. It is not intended to diagnose MRSA  infection nor to guide or monitor treatment for MRSA infections. RESULT CALLED TO, READ BACK BY AND VERIFIED WITH: NI,A/2W@0315  ON 12/05/14 BY KARCZEWSKI,S.   Urine culture     Status: None   Collection Time: 12/06/14 11:48 PM  Result Value Ref Range Status   Specimen Description URINE, CATHETERIZED  Final   Special Requests NONE  Final   Culture   Final    NO GROWTH 1 DAY Performed at [facility]    Report Status 12/08/2014 FINAL  Final  C difficile quick scan w PCR reflex     Status: Abnormal   Collection Time: 12/07/14  6:00 PM  Result Value Ref Range Status   C Diff antigen POSITIVE (A) NEGATIVE Final   C Diff toxin NEGATIVE NEGATIVE Final   C Diff interpretation   Final    C. difficile present, but toxin not detected. This indicates  colonization. In most cases, this does not require treatment. If patient has signs and symptoms consistent with colitis, consider treatment. Requires ENTERIC precautions.    Anti-infectives    Start     Dose/Rate Route Frequency Ordered Stop   12/09/14 1000  metroNIDAZOLE (FLAGYL) tablet 500 mg     500 mg Oral 3 times per day 12/09/14 0901     12/08/14 0800  metroNIDAZOLE (FLAGYL) IVPB 500 mg  Status:  Discontinued     500 mg 100 mL/hr over 60 Minutes Intravenous Every 8 hours 12/08/14 0751 12/08/14 1139   12/07/14 0800  vancomycin (VANCOCIN) 1,500 mg in sodium chloride 0.9 % 500 mL IVPB  Status:  Discontinued     1,500 mg 250 mL/hr over 120 Minutes Intravenous Every 48 hours 12/07/14 0611 12/08/14 0758   12/05/14 0900  imipenem-cilastatin (PRIMAXIN) 250 mg in sodium chloride 0.9 % 100 mL IVPB     250 mg 200 mL/hr over 30 Minutes Intravenous Every 12 hours 12/04/14 2109     12/04/14 2200  imipenem-cilastatin (PRIMAXIN) 500 mg in sodium chloride 0.9 % 100 mL IVPB  Status:  Discontinued     500 mg 200 mL/hr over 30 Minutes Intravenous 3 times per day 12/04/14 2049 12/04/14 2053   12/04/14 2115  vancomycin (VANCOCIN) 2,000 mg in sodium chloride 0.9 % 500 mL IVPB     2,000 mg 250 mL/hr over 120 Minutes Intravenous NOW 12/04/14 2109 12/05/14 0113   12/04/14 2115  imipenem-cilastatin (PRIMAXIN) 250 mg in sodium chloride 0.9 % 100 mL IVPB     250 mg 200 mL/hr over 30 Minutes Intravenous NOW 12/04/14 2109 12/04/14 2221      Assessment: Patient's a 81 y.o F with ESBL UTI from ucx on 9/24 and was treated with keflex PTA. She was referred to the ED on 9/28 by her PCP for further workup of elevated LFTs and ammonia level. In the ED, she was found to have elevated WBC and LA. To start abx for suspected sepsis secondary to UTI.  9/28 >> vanc >> 10/2 9/28 >> primaxin (MD) >> 10/2 >> flagyl >>   9/28 blood x 2: NGTD 9/28 MRSA PCR: positive 9/30 urine: NGF 10/1 C diff antigen positive, toxin  negative  9/24 urine: >100K ESBL Ecoli (S= primaxin, NTF, bactrim)  Today, 12/09/2014 Remains afebrile WBC improved to WNL Scr still slowly improving, CrCl ~ 20 ml/min/1.52m2 (normalized)  Goal of Therapy:  Vancomycin trough level 15-20 mcg/ml  Primaxin dose per weight and renal function  Plan:  Day #6 Primaxin  Increase Primaxin to 250mg  IV q6h F/u  renal function and adjust as needed  Kathryn [name], PharmD, BCPS Pager: [phone] 12/09/2014,9:27 AM

## 2014-12-09 NOTE — Progress Notes (Signed)
[facility] Progress Note  Subjective:  Feels ok.  Not having any abdominal pain.  No further diarrhea.  Objective:  Vital signs in last 24 hours: Temp:  [97.4 F (36.3 C)-98.1 F (36.7 C)] 97.4 F (36.3 C) (10/03 0800) Pulse Rate:  [32-88] 52 (10/03 0600) Resp:  [10-29] 16 (10/03 0600) BP: (87-152)/(32-72) 99/48 mmHg (10/03 0600) SpO2:  [93 %-100 %] 100 % (10/03 0600) Weight:  [261 lb 0.4 oz (118.4 kg)] 261 lb 0.4 oz (118.4 kg) (10/03 0500) Last BM Date: 12/08/14 General:  Alert, chronically ill-appearing, in NAD Heart:  Bradycardic; no murmurs. Pulm:  CTAB.  No W/R/R. Abdomen:  Soft, non-distended.  BS present.  Minimal upper abdominal TTP without R/R/G. Extremities:  Some UE edema noted. Neurologic:  Alert and  oriented x4;  grossly normal neurologically. Psych:  Alert and cooperative. Normal mood and affect.  Intake/Output from previous day: 10/02 0701 - 10/03 0700 In: 1451.7 [P.O.:120; I.V.:1231.7; IV Piggyback:100] Out: 600 [Urine:600]  Lab Results:  Recent Labs  12/07/14 0405 12/08/14 0643 12/09/14 0345  WBC 14.9* 9.8 8.6  HGB 10.7* 9.3* 9.6*  HCT 32.8* 28.1* 29.3*  PLT 260 205 176   BMET  Recent Labs  12/07/14 0405 12/08/14 0643 12/09/14 0345  NA 137 133* 133*  K 4.2 3.3* 3.7  CL 102 104 104  CO2 22 23 20*  GLUCOSE 239* 271* 168*  BUN 60* 59* 56*  CREATININE 3.48* 2.94* 2.68*  CALCIUM 8.6* 7.4* 7.6*   LFT  Recent Labs  12/09/14 0345  PROT 5.1*  ALBUMIN 2.5*  AST 25  ALT 13*  ALKPHOS 279*  BILITOT 0.8   Dg Ercp Biliary & Pancreatic Ducts  12/07/2014   CLINICAL DATA:  Choledocholithiasis.  EXAM: ERCP with sphincterotomy  TECHNIQUE: Multiple spot images obtained with the fluoroscopic device and submitted for interpretation post-procedure.  FLUOROSCOPY TIME:  Fluoroscopy Time:  2 minutes 6 seconds  Number of Acquired Images:  14  COMPARISON:  MRI abdomen/MRCP from 12/06/2014.  FINDINGS: Initial contrast-enhanced images of  the common bile duct demonstrate multiple small ovoid filling defects in the mid to distal common bile duct with dilated proximal common bile duct. Multiple balloon sweeps were performed of the common bile duct. The final provided post balloon sweep images of the common bile duct demonstrate no definite residual filling defects in the common bile duct.  IMPRESSION: Multiple small ovoid filling defects in the dilated mid to distal common bile duct, in keeping with choledocholithiasis. No residual filling defects in the common bile duct status post balloon sweep.  These images were submitted for radiologic interpretation only. Please see the procedural report for the amount of contrast and the fluoroscopy time utilized.   Electronically Signed   By: Ilona Sorrel M.D.   On: 12/07/2014 19:53    Assessment / Plan: *81 year old female admitted with nausea, vomiting, and coffee ground emesis. Patient also had abnormal liver functions. CT of the abdomen and pelvis was negative for acute findings. MRCP with filling defects in CBD, suspicious for choledocholithiasis. S/p ERCP 10/1 with sphincterotomy and removal of stones. LFTs improving. Now C diff antigen positive--was started on flagyl and florastor added yesterday.   **Will sign off from GI standpoint.  Call with questions.    LOS: 5 days   ZEHR, JESSICA D.  12/09/2014, 8:40 AM  Pager number [phone]     Attending physician's note   I have taken an interval history, reviewed the chart and examined the patient.  I agree with the Advanced Practitioner's note, impression and recommendations. Stable post ERCP and stone extraction. Pt currently on Flagyl however without diarrhea or colitis symptoms it is not necessary to treat a positive C diff antigen. GI signing off.   Lucio Edward, MD [facility] [phone] Mon-Fri 8a-5p [phone] after 5p, weekends, holidays

## 2014-12-09 NOTE — Plan of Care (Signed)
Problem: Phase I Progression Outcomes Goal: Voiding-avoid urinary catheter unless indicated Outcome: Not Applicable Date Met:  30/15/99 Chronic foley

## 2014-12-09 NOTE — Progress Notes (Signed)
[facility] PROGRESS NOTE  Kathryn [name] CVE:[id number] DOB: 1933/11/19 DOA: 12/04/2014 PCP: Blanchie Serve, MD  Assessment/Plan: Kathryn [name] is a 81 y.o. female with PMH of bedbound, , hypertension, diabetes mellitus, GERD, depression, atrial fibrillation not on anticoagulant (unclear reason), recurrent UTI, diastolic congestive heart failure, CKD-III, who admitted because for nausea, vomiting, constipation, acute encephalopathy.  Sepsis: Likely due  UTI. Patient has been treated with Keflex for positive urinalysis with Escherichia coli, which are resistant to lactamase urine culture 9-29.  -Continue with  IV Vancomycin and Primaxin -follow up Bx and Ux no growth to date.  -Lactic acid trending down.  -discontinue vancomycin on 10-01.    UTI: -E coli from culture 9-24, multiresistant, sensitive to imipenem.  -Day 5 of IV antibiotics.   Transaminases:  -GTT elevated, Alk phosphatase elevated  -RUQ [place]: No acute findings. No biliary dilatation status post cholecystectomy. -GI consulted. MRCP with filling defect. S/P ERCP, extraction with multiple stones.  -LF trending down.  -advance diet.   Acute encephalopathy: She is mildly confused, but oriented 3. Likely due to sepsis and UTI. -Treat UTI and sepsis as above -ammonia level at 11.  -Changed oxycodone to PRN. -hold lactulose, due to diarrhea.   AoCKD-III: Baseline Cr is 1.4 , her Cr is 4.49 on admission. Hemodynamic related, hypotension, infection, sepsis, also use of ACE.  - Hold lisinopril.  -cr trending down. Toady at 2.9 -foley catheter place 9-30.  -appreciate nephrologist assistance.  -replete K.  -On IV fluids, defer fluids and or lasix to nephrologist. Requiring more oxygen.   Diarrhea;  C diff antigen positive. Patient at risk for developing C diff. She will need  Antibiotic for complex UTI/ Will start Flagyl.   Possible UGI: Nurse noticed coffee-ground material from her vomited gastric  materials.Differential : acute gastritis or stress ulcers. Patient does not have history of cirrhosis. Her hemoglobin is 12.6 on 11/29/14-->12.0.  - continue IV Protonix - Avoid NSAIDs and SQ heparin -Hb stable.  -GI consulted. No need for endoscopy.   Atrial Fibrillation: CHA2DS2-VASc Score is 6, needs oral anticoagulation. Patient is not on AC at home (unclear reason), now presents with possible GIB, should not start anticoagulants.  -holding metoprolol due to hypotension  DM-II: Last A1c 5.8 on 06/04/14.  -SSI  HTN: -Hold amlodipine and lisinopril since patient blood pressure is a soft due to sepsis  Protein-calorie malnutrition: -Ensure TID  Chronic diastolic CHF (congestive heart failure): compensated.  - BNP; 167 -on metoprolol lower dose.   Decubitus ulcer-stage 3: -Wound care consulted.    Code Status: DNR Family Communication: care discussed with son 10-02 Disposition Plan: Remain in the step down unit.   Consultants:  GI  Nephrologist  Procedures:  Abdominal US  Antibiotics:  Vancomycin stop 10-01  Imipenem/   Flagyl 10-03  HPI/Subjective: Feeling weak, denies dyspnea, has decrease appetite  Objective: Filed Vitals:   12/09/14 0800  BP:   Pulse:   Temp: 97.4 F (36.3 C)  Resp:     Intake/Output Summary (Last 24 hours) at 12/09/14 0844 Last data filed at 12/09/14 0600  Gross per 24 hour  Intake 1451.67 ml  Output    600 ml  Net 851.67 ml   Filed Weights   12/07/14 0103 12/08/14 0400 12/09/14 0500  Weight: 114 kg (251 lb 5.2 oz) 115 kg (253 lb 8.5 oz) 118.4 kg (261 lb 0.4 oz)    Exam:   General:  Alert in no distress.   Cardiovascular: S 1, S  2 IRR  Respiratory: CTA  Abdomen: BS present, mildly distended.   Musculoskeletal: edema 4 extremities.  Data Reviewed: Basic Metabolic Panel:  Recent Labs Lab 12/05/14 0325 12/06/14 0434 12/07/14 0405 12/08/14 0643 12/09/14 0345  NA 133* 134* 137 133* 133*  K 5.0 4.0 4.2  3.3* 3.7  CL 101 105 102 104 104  CO2 18* 16* 22 23 20*  GLUCOSE 231* 148* 239* 271* 168*  BUN 59* 61* 60* 59* 56*  CREATININE 4.15* 3.82* 3.48* 2.94* 2.68*  CALCIUM 9.0 8.6* 8.6* 7.4* 7.6*   Liver Function Tests:  Recent Labs Lab 12/05/14 0325 12/06/14 0840 12/07/14 0405 12/08/14 0643 12/09/14 0345  AST 39 $Remo'29 24 24 25  'lLqwP$ ALT 37 $Remo'29 27 18 'ZUBFq$ 13*  ALKPHOS 519* 414* 406* 283* 279*  BILITOT 1.4* 1.1 1.3* 1.0 0.8  PROT 5.8* 5.6* 5.8* 4.7* 5.1*  ALBUMIN 2.7* 2.4* 2.6* 2.0* 2.5*    Recent Labs Lab 12/05/14 0130 12/06/14 0840  LIPASE 12* 18*    Recent Labs Lab 12/04/14 1805  AMMONIA 11   CBC:  Recent Labs Lab 12/04/14 1804  12/05/14 0937 12/06/14 0434 12/07/14 0405 12/08/14 0643 12/09/14 0345  WBC 21.2*  < > 14.6* 17.3* 14.9* 9.8 8.6  NEUTROABS 20.2*  --   --   --   --   --   --   HGB 12.0  < > 10.5* 10.5* 10.7* 9.3* 9.6*  HCT 35.5*  < > 31.9* 31.3* 32.8* 28.1* 29.3*  MCV 100.0  < > 103.2* 100.0 100.9* 101.8* 102.1*  PLT 299  < > 281 251 260 205 176  < > = values in this interval not displayed. Cardiac Enzymes: No results for input(s): CKTOTAL, CKMB, CKMBINDEX, TROPONINI in the last 168 hours. BNP (last 3 results)  Recent Labs  12/05/14 0325  BNP 167.5*    ProBNP (last 3 results) No results for input(s): PROBNP in the last 8760 hours.  CBG:  Recent Labs Lab 12/08/14 0807 12/08/14 1153 12/08/14 1653 12/08/14 2221 12/09/14 0741  GLUCAP 269* 280* 182* 172* 150*    Recent Results (from the past 240 hour(s))  Urine culture     Status: None   Collection Time: 11/30/14 10:30 PM  Result Value Ref Range Status   Specimen Description URINE, CATHETERIZED  Final   Special Requests NONE  Final   Culture   Final    >=100,000 COLONIES/mL ESCHERICHIA COLI Confirmed Extended Spectrum Beta-Lactamase Producer (ESBL) Performed at [facility]    Report Status 12/04/2014 FINAL  Final   Organism ID, Bacteria ESCHERICHIA COLI  Final      Susceptibility    Escherichia coli - MIC*    AMPICILLIN >=32 RESISTANT Resistant     CEFAZOLIN >=64 RESISTANT Resistant     CEFTRIAXONE >=64 RESISTANT Resistant     CIPROFLOXACIN >=4 RESISTANT Resistant     GENTAMICIN >=16 RESISTANT Resistant     IMIPENEM <=0.25 SENSITIVE Sensitive     NITROFURANTOIN <=16 SENSITIVE Sensitive     TRIMETH/SULFA <=20 SENSITIVE Sensitive     AMPICILLIN/SULBACTAM >=32 RESISTANT Resistant     PIP/TAZO >=128 RESISTANT Resistant     * >=100,000 COLONIES/mL ESCHERICHIA COLI  Blood culture (routine x 2)     Status: None (Preliminary result)   Collection Time: 12/04/14  6:00 PM  Result Value Ref Range Status   Specimen Description BLOOD LEFT HAND  Final   Special Requests BOTTLES DRAWN AEROBIC AND ANAEROBIC [facility] EACH  Final   Culture   Final  NO GROWTH 4 DAYS Performed at [facility]    Report Status PENDING  Incomplete  Blood culture (routine x 2)     Status: None (Preliminary result)   Collection Time: 12/04/14  6:00 PM  Result Value Ref Range Status   Specimen Description BLOOD RIGHT HAND  Final   Special Requests BOTTLES DRAWN AEROBIC AND ANAEROBIC 5CC EACH  Final   Culture   Final    NO GROWTH 4 DAYS Performed at [facility]    Report Status PENDING  Incomplete  MRSA PCR Screening     Status: Abnormal   Collection Time: 12/04/14 11:16 PM  Result Value Ref Range Status   MRSA by PCR POSITIVE (A) NEGATIVE Final    Comment:        The GeneXpert MRSA Assay (FDA approved for NASAL specimens only), is one component of a comprehensive MRSA colonization surveillance program. It is not intended to diagnose MRSA infection nor to guide or monitor treatment for MRSA infections. RESULT CALLED TO, READ BACK BY AND VERIFIED WITH: NI,A/2W@0315  ON 12/05/14 BY KARCZEWSKI,S.   Urine culture     Status: None   Collection Time: 12/06/14 11:48 PM  Result Value Ref Range Status   Specimen Description URINE, CATHETERIZED  Final   Special Requests NONE  Final    Culture   Final    NO GROWTH 1 DAY Performed at [facility]    Report Status 12/08/2014 FINAL  Final  C difficile quick scan w PCR reflex     Status: Abnormal   Collection Time: 12/07/14  6:00 PM  Result Value Ref Range Status   C Diff antigen POSITIVE (A) NEGATIVE Final   C Diff toxin NEGATIVE NEGATIVE Final   C Diff interpretation   Final    C. difficile present, but toxin not detected. This indicates colonization. In most cases, this does not require treatment. If patient has signs and symptoms consistent with colitis, consider treatment. Requires ENTERIC precautions.     Studies: Dg Ercp Biliary & Pancreatic Ducts  12/07/2014   CLINICAL DATA:  Choledocholithiasis.  EXAM: ERCP with sphincterotomy  TECHNIQUE: Multiple spot images obtained with the fluoroscopic device and submitted for interpretation post-procedure.  FLUOROSCOPY TIME:  Fluoroscopy Time:  2 minutes 6 seconds  Number of Acquired Images:  14  COMPARISON:  MRI abdomen/MRCP from 12/06/2014.  FINDINGS: Initial contrast-enhanced images of the common bile duct demonstrate multiple small ovoid filling defects in the mid to distal common bile duct with dilated proximal common bile duct. Multiple balloon sweeps were performed of the common bile duct. The final provided post balloon sweep images of the common bile duct demonstrate no definite residual filling defects in the common bile duct.  IMPRESSION: Multiple small ovoid filling defects in the dilated mid to distal common bile duct, in keeping with choledocholithiasis. No residual filling defects in the common bile duct status post balloon sweep.  These images were submitted for radiologic interpretation only. Please see the procedural report for the amount of contrast and the fluoroscopy time utilized.   Electronically Signed   By: Ilona Sorrel M.D.   On: 12/07/2014 19:53    Scheduled Meds: . antiseptic oral rinse  7 mL Mouth Rinse q12n4p  . beta carotene w/minerals  1  tablet Oral BID  . calcium-vitamin D  1 tablet Oral BID  . chlorhexidine  15 mL Mouth Rinse BID  . cholecalciferol  400 Units Oral Daily  . feeding supplement (ENSURE ENLIVE)  237 mL Oral BID BM  . gabapentin  300 mg Oral QHS  . imipenem-cilastatin  250 mg Intravenous Q12H  . insulin aspart  0-9 Units Subcutaneous TID WC  . multivitamin with minerals  1 tablet Oral Daily  . mupirocin ointment  1 application Nasal BID  . pantoprazole (PROTONIX) IV  40 mg Intravenous Q12H  . saccharomyces boulardii  250 mg Oral BID  . sodium chloride  1,000 mL Intravenous Once  . sodium chloride  3 mL Intravenous Q12H   Continuous Infusions: . sodium chloride 50 mL/hr at 12/09/14 0717    Principal Problem:   Sepsis ([place]) Active Problems:   Macular degeneration (senile) of retina   Essential hypertension   Atrial fibrillation (HCC)   Osteoporosis   GERD (gastroesophageal reflux disease)   DM (diabetes mellitus), type 2 with renal complications (HCC)   Acute renal failure superimposed on stage 3 chronic kidney disease (HCC)   Protein-calorie malnutrition (HCC)   Pressure ulcer, stage 3 (HCC)   Type 2 diabetes mellitus with diabetic polyneuropathy (HCC)   Constipation, chronic   Major depression, chronic (HCC)   Chronic diastolic CHF (congestive heart failure) (HCC)   UTI (lower urinary tract infection)   UGI bleed   Nausea & vomiting   Acute encephalopathy   Decubitus ulcer of ankle, stage 3 (HCC)   GI hemorrhage   Abnormal LFTs   Choledocholithiasis    Time spent: 25 minutes.     Niel Hummer A  [facility] Pager [phone]. If 7PM-7AM, please contact night-coverage at [email or web address], password [facility] 12/09/2014, 8:44 AM  LOS: 5 days

## 2014-12-09 NOTE — Progress Notes (Addendum)
S: feels better O:BP 144/44 mmHg  Pulse 85  Temp(Src) 97.4 F (36.3 C) (Oral)  Resp 18  Ht 5\' 10"  (1.778 m)  Wt 118.4 kg (261 lb 0.4 oz)  BMI 37.45 kg/m2  SpO2 100%  Intake/Output Summary (Last 24 hours) at 12/09/14 1210 Last data filed at 12/09/14 1000  Gross per 24 hour  Intake   1420 ml  Output    525 ml  Net    895 ml   Weight change: 3.4 kg (7 lb 7.9 oz) MWN:UUVOZ and alert, forgetful CVS:RRR Resp:clear ant Abd:+ BS NTND Ext: no edema in legs.  + edema of arms NEURO: CNI, no asterixis   . antiseptic oral rinse  7 mL Mouth Rinse q12n4p  . beta carotene w/minerals  1 tablet Oral BID  . calcium-vitamin D  1 tablet Oral BID  . chlorhexidine  15 mL Mouth Rinse BID  . cholecalciferol  400 Units Oral Daily  . feeding supplement (ENSURE ENLIVE)  237 mL Oral TID BM  . gabapentin  300 mg Oral QHS  . imipenem-cilastatin  250 mg Intravenous Q6H  . insulin aspart  0-9 Units Subcutaneous TID WC  . metroNIDAZOLE  500 mg Oral 3 times per day  . multivitamin with minerals  1 tablet Oral Daily  . pantoprazole (PROTONIX) IV  40 mg Intravenous Q12H  . saccharomyces boulardii  250 mg Oral BID  . sodium chloride  1,000 mL Intravenous Once  . sodium chloride  3 mL Intravenous Q12H   Dg Ercp Biliary & Pancreatic Ducts  12/07/2014   CLINICAL DATA:  Choledocholithiasis.  EXAM: ERCP with sphincterotomy  TECHNIQUE: Multiple spot images obtained with the fluoroscopic device and submitted for interpretation post-procedure.  FLUOROSCOPY TIME:  Fluoroscopy Time:  2 minutes 6 seconds  Number of Acquired Images:  14  COMPARISON:  MRI abdomen/MRCP from 12/06/2014.  FINDINGS: Initial contrast-enhanced images of the common bile duct demonstrate multiple small ovoid filling defects in the mid to distal common bile duct with dilated proximal common bile duct. Multiple balloon sweeps were performed of the common bile duct. The final provided post balloon sweep images of the common bile duct demonstrate no  definite residual filling defects in the common bile duct.  IMPRESSION: Multiple small ovoid filling defects in the dilated mid to distal common bile duct, in keeping with choledocholithiasis. No residual filling defects in the common bile duct status post balloon sweep.  These images were submitted for radiologic interpretation only. Please see the procedural report for the amount of contrast and the fluoroscopy time utilized.   Electronically Signed   By: Ilona Sorrel M.D.   On: 12/07/2014 19:53   BMET    Component Value Date/Time   NA 133* 12/09/2014 0345   NA 139 08/14/2014   K 3.7 12/09/2014 0345   CL 104 12/09/2014 0345   CO2 20* 12/09/2014 0345   GLUCOSE 168* 12/09/2014 0345   BUN 56* 12/09/2014 0345   BUN 43* 08/14/2014   CREATININE 2.68* 12/09/2014 0345   CREATININE 1.4* 08/14/2014   CALCIUM 7.6* 12/09/2014 0345   CALCIUM 8.2* 10/10/2010 1613   GFRNONAA 16* 12/09/2014 0345   GFRAA 18* 12/09/2014 0345   CBC    Component Value Date/Time   WBC 8.6 12/09/2014 0345   WBC 4.8 08/14/2014   WBC 9.1 07/22/2011 1517   RBC 2.87* 12/09/2014 0345   RBC 3.65* 07/22/2011 1517   RBC 2.00* 10/11/2010 0500   HGB 9.6* 12/09/2014 0345   HGB 11.7  07/22/2011 1517   HCT 29.3* 12/09/2014 0345   HCT 33.4* 07/22/2011 1517   PLT 176 12/09/2014 0345   PLT 246 07/22/2011 1517   MCV 102.1* 12/09/2014 0345   MCV 91.5 07/22/2011 1517   MCH 33.4 12/09/2014 0345   MCH 32.1 07/22/2011 1517   MCHC 32.8 12/09/2014 0345   MCHC 35.0 07/22/2011 1517   RDW 13.2 12/09/2014 0345   RDW 14.6* 07/22/2011 1517   LYMPHSABS 0.6* 12/04/2014 1804   LYMPHSABS 1.2 07/22/2011 1517   MONOABS 0.4 12/04/2014 1804   MONOABS 0.6 07/22/2011 1517   EOSABS 0.0 12/04/2014 1804   EOSABS 0.5 07/22/2011 1517   BASOSABS 0.0 12/04/2014 1804   BASOSABS 0.1 07/22/2011 1517     Assessment: 1 Acute on CKD 3 - due to vol depletion/ hypotension/ ARB. Resolving, creat down 2.68 today. Now has some vol excess w edema.  2 Bile  duct stones SP removal and spincterotomy 3 Vol excess 3.DM 4 UTI 5 Hypokalemia  Plan - stop IVF"s, start po laxis 80 tid, lab in am   Rinoa Garramone D

## 2014-12-09 NOTE — Care Management Note (Signed)
Case Management Note  Patient Details  Name: LEXIS POTENZA MRN: [id number] Date of Birth: 20-Jun-1933  Subjective/Objective:                    Action/Plan:   Expected Discharge Date:   (unknown)               Expected Discharge Plan:  Skilled Nursing Facility  In-House Referral:  Clinical Social Work  Discharge planning Services  CM Consult  Post Acute Care Choice:  NA Choice offered to:  NA  DME Arranged:    DME Agency:     HH Arranged:    [place] Agency:     Status of Service:  In process, will continue to follow  Medicare Important Message Given:    Date Medicare IM Given:    Medicare IM give by:    Date Additional Medicare IM Given:    Additional Medicare Important Message give by:     If discussed at [place] of Stay Meetings, dates discussed:    Additional Comments:  [name], RN 12/09/2014, 10:02 AM

## 2014-12-10 ENCOUNTER — Encounter (HOSPITAL_COMMUNITY): Payer: Self-pay | Admitting: Gastroenterology

## 2014-12-10 LAB — CBC
HEMATOCRIT: 31.2 % — AB (ref 36.0–46.0)
HEMOGLOBIN: 10.3 g/dL — AB (ref 12.0–15.0)
MCH: 33.7 pg (ref 26.0–34.0)
MCHC: 33 g/dL (ref 30.0–36.0)
MCV: 102 fL — AB (ref 78.0–100.0)
PLATELETS: 187 10*3/uL (ref 150–400)
RBC: 3.06 MIL/uL — AB (ref 3.87–5.11)
RDW: 13.2 % (ref 11.5–15.5)
WBC: 7.6 10*3/uL (ref 4.0–10.5)

## 2014-12-10 LAB — BASIC METABOLIC PANEL
ANION GAP: 10 (ref 5–15)
BUN: 60 mg/dL — ABNORMAL HIGH (ref 6–20)
CHLORIDE: 105 mmol/L (ref 101–111)
CO2: 23 mmol/L (ref 22–32)
Calcium: 7.8 mg/dL — ABNORMAL LOW (ref 8.9–10.3)
Creatinine, Ser: 2.47 mg/dL — ABNORMAL HIGH (ref 0.44–1.00)
GFR calc non Af Amer: 17 mL/min — ABNORMAL LOW (ref >60)
GFR, EST AFRICAN AMERICAN: 20 mL/min — AB (ref >60)
Glucose, Bld: 182 mg/dL — ABNORMAL HIGH (ref 65–99)
POTASSIUM: 3.5 mmol/L (ref 3.5–5.1)
SODIUM: 138 mmol/L (ref 135–145)

## 2014-12-10 LAB — GLUCOSE, CAPILLARY
GLUCOSE-CAPILLARY: 157 mg/dL — AB (ref 65–99)
Glucose-Capillary: 172 mg/dL — ABNORMAL HIGH (ref 65–99)
Glucose-Capillary: 166 mg/dL — ABNORMAL HIGH (ref 65–99)
Glucose-Capillary: 117 mg/dL — ABNORMAL HIGH (ref 65–99)
Glucose-Capillary: 139 mg/dL — ABNORMAL HIGH (ref 65–99)

## 2014-12-10 MED ORDER — GLUCERNA SHAKE PO LIQD
237.0000 mL | Freq: Two times a day (BID) | ORAL | Status: DC
  Administered 2014-12-10 – 2014-12-13 (×4): 237 mL via ORAL
  Filled 2014-12-10 (×8): qty 237

## 2014-12-10 MED ORDER — FUROSEMIDE 40 MG PO TABS
60.0000 mg | ORAL_TABLET | Freq: Three times a day (TID) | ORAL | Status: DC
  Administered 2014-12-10 – 2014-12-13 (×8): 60 mg via ORAL
  Filled 2014-12-10 (×19): qty 1

## 2014-12-10 MED ORDER — METOPROLOL TARTRATE 25 MG PO TABS
25.0000 mg | ORAL_TABLET | Freq: Two times a day (BID) | ORAL | Status: DC
  Administered 2014-12-10 – 2014-12-13 (×5): 25 mg via ORAL
  Filled 2014-12-10 (×7): qty 1

## 2014-12-10 NOTE — Progress Notes (Signed)
Initial Nutrition Assessment  DOCUMENTATION CODES:   Obesity unspecified  INTERVENTION:  - Will change Ensure Enlive to Glucerna Shake BID, each supplement provides 220 kcal and 10 grams of protein - Encourage intakes of meals when pt is awake - RD will continue to monitor for needs  NUTRITION DIAGNOSIS:   Altered nutrition lab value related to chronic illness as evidenced by other (see comment) (CBGs: 150-280 mg/dL).  GOAL:   Patient will meet greater than or equal to 90% of their needs  MONITOR:   PO intake, Supplement acceptance, Weight trends, Labs, I & O's  REASON FOR ASSESSMENT:   Low Braden  ASSESSMENT:   81 y.o. female with PMH of bedbound, dwelling catheter, hypertension, diabetes mellitus, GERD, depression, atrial fibrillation not on anticoagulant (unclear reason), recurrent UTI, diastolic congestive heart failure, CKD-III, who admitted because for nausea, vomiting, constipation, acute encephalopathy.  Pt seen for low Braden. BMI indicates obesity. No intakes documented since admission. Pt had ERCP 12/07/14 with removal of sever stones following sphincterotomy.   Pt sleeping soundly at time of RD visit with breakfast tray in the room untouched. No family or visitors in the room at time of RD visit. Unsure if pt is meeting needs PTA or intakes PTA. Per chart review, pt has gained 15 lbs in the past 12 days which may be fluid related given severe edema. No muscle or fat wasting present.   Medications reviewed. Labs reviewed; CBGs: 150-280 mg/dL, BUN/creatinine elevated, Ca: 7.8 mg/dL, GFR: 17.   Diet Order:  Diet renal with fluid restriction Room service appropriate?: Yes; Fluid consistency:: Thin  Skin:  Wound (see comment) (Sacral pressure ulcer with no documented stage)  Last BM:  10/2  Height:   Ht Readings from Last 1 Encounters:  12/04/14 5\' 10"  (1.778 m)    Weight:   Wt Readings from Last 1 Encounters:  12/10/14 251 lb 5.2 oz (114 kg)    Ideal  Body Weight:  68.18 kg (kg)  BMI:  Body mass index is 36.06 kg/(m^2).  Estimated Nutritional Needs:   Kcal:  1700-1900  Protein:  75-85 grams  Fluid:  1.5-1.7 L/day  EDUCATION NEEDS:   No education needs identified at this time     Jarome Matin, RD, LDN Inpatient Clinical Dietitian Pager # [phone] After hours/weekend pager # [phone]

## 2014-12-10 NOTE — Progress Notes (Addendum)
[facility] PROGRESS NOTE  Kathryn [name] BWL:[id number] DOB: 1933-06-21 DOA: 12/04/2014 PCP: Blanchie Serve, MD  Assessment/Plan: Kathryn [name] is a 81 y.o. female with PMH of bedbound, , hypertension, diabetes mellitus, GERD, depression, atrial fibrillation not on anticoagulant (unclear reason), recurrent UTI, diastolic congestive heart failure, CKD-III, who admitted because for nausea, vomiting, constipation, acute encephalopathy. Patient admitted with sepsis secondary to UTI. Also transaminases, found to have filling defect on MRCP. S/P ERCP, extraction with multiple stones. Her encephalopathy was thought to be secondary to UTI, metabolic and hepatic. She also presented in renal failure secondary to sepsis, hypotension.. Renal failure improved with fluids. She is now with hypervolemia. She was started on lasix 10-03 by nephrologist.    Sepsis: Likely due  UTI. Patient has been treated with Keflex for positive urinalysis with Escherichia coli, which are resistant to lactamase urine culture 9-29.  -Continue with  IV Vancomycin and Primaxin -follow up Bx and Ux no growth to date.  -Lactic acid trending down.  -discontinue vancomycin on 10-01.    UTI: -E coli from culture 9-24, multiresistant, sensitive to imipenem.  -Day 6 of IV antibiotics. Need treatment for complicated UTI due to foley catheter   Transaminases:  -GTT elevated, Alk phosphatase elevated  -RUQ [place]: No acute findings. No biliary dilatation status post cholecystectomy. -GI consulted. MRCP with filling defect. S/P ERCP, extraction with multiple stones.  -LF trending down.  -advance diet.   Acute encephalopathy: Likely due to sepsis and UTI. -Treat UTI and sepsis as above -ammonia level at 11. Ammonia level done at her facility was at 74.  -Changed oxycodone to PRN. -hold lactulose, due to diarrhea.   AoCKD-III: Baseline Cr is 1.4 , her Cr is 4.49 on admission. Hemodynamic related, hypotension, infection,  sepsis, also use of ACE.  - Hold lisinopril.  -cr trending down. Toady at 2.9 -foley catheter place 9-30.  -appreciate nephrologist assistance.  -Started on lasix 10-03. Monitor renal function on lasix.   Diarrhea;  Resolved, in setting of laxatives. C diff antigen positive. Per GI no need to treat for C diff.     Possible UGI: Nurse noticed coffee-ground material from her vomited gastric materials.Differential : acute gastritis or stress ulcers. Patient does not have history of cirrhosis. Her hemoglobin is 12.6 on 11/29/14-->12.0.  - continue IV Protonix - Avoid NSAIDs and SQ heparin -Hb stable.  -GI consulted. No need for endoscopy.   Atrial Fibrillation: CHA2DS2-VASc Score is 6, needs oral anticoagulation. Patient is not on AC at home (unclear reason), now presents with possible GIB, should not start anticoagulants.  -resume metoprolol lower dose to avoid hypotension.   DM-II: Last A1c 5.8 on 06/04/14.  -SSI  HTN: -Hold amlodipine and lisinopril since patient blood pressure is a soft due to sepsis  Protein-calorie malnutrition: -Ensure TID  Chronic diastolic CHF (congestive heart failure): compensated.  - BNP; 167 -on metoprolol lower dose.   Decubitus ulcer-stage 3: -Wound care consulted. Local care    Code Status: DNR Family Communication: care discussed with son 10-02, discussed with patient 10-04 Disposition Plan: transfer to Med-surgery , PT consult, monitor renal function.    Consultants:  GI  Nephrologist  Procedures:  Abdominal US  Antibiotics:  Vancomycin stop 10-01  Imipenem/   Flagyl 10-03---10-04  HPI/Subjective: Denies abdominal pain, no diarrhea. Denies dyspnea.   Objective: Filed Vitals:   12/10/14 0650  BP: 110/41  Pulse: 88  Temp:   Resp: 14    Intake/Output Summary (Last 24 hours) at 12/10/14  7867 Last data filed at 12/10/14 0600  Gross per 24 hour  Intake    880 ml  Output   2550 ml  Net  -1670 ml   Filed Weights    12/08/14 0400 12/09/14 0500 12/10/14 0423  Weight: 115 kg (253 lb 8.5 oz) 118.4 kg (261 lb 0.4 oz) 114 kg (251 lb 5.2 oz)    Exam:   General:  Alert in no distress.   Cardiovascular: S 1, S 2 IRR  Respiratory: CTA  Abdomen: BS present, mildly distended.   Musculoskeletal: edema 4 extremities.  Data Reviewed: Basic Metabolic Panel:  Recent Labs Lab 12/06/14 0434 12/07/14 0405 12/08/14 0643 12/09/14 0345 12/10/14 0410  NA 134* 137 133* 133* 138  K 4.0 4.2 3.3* 3.7 3.5  CL 105 102 104 104 105  CO2 16* 22 23 20* 23  GLUCOSE 148* 239* 271* 168* 182*  BUN 61* 60* 59* 56* 60*  CREATININE 3.82* 3.48* 2.94* 2.68* 2.47*  CALCIUM 8.6* 8.6* 7.4* 7.6* 7.8*   Liver Function Tests:  Recent Labs Lab 12/05/14 0325 12/06/14 0840 12/07/14 0405 12/08/14 0643 12/09/14 0345  AST 39 _0 ALT 37 _1 13*  ALKPHOS 519* 414* 406* 283* 279*  BILITOT 1.4* 1.1 1.3* 1.0 0.8  PROT 5.8* 5.6* 5.8* 4.7* 5.1*  ALBUMIN 2.7* 2.4* 2.6* 2.0* 2.5*    Recent Labs Lab 12/05/14 0130 12/06/14 0840  LIPASE 12* 18*    Recent Labs Lab 12/04/14 1805  AMMONIA 11   CBC:  Recent Labs Lab 12/04/14 1804  12/06/14 0434 12/07/14 0405 12/08/14 0643 12/09/14 0345 12/10/14 0410  WBC 21.2*  < > 17.3* 14.9* 9.8 8.6 7.6  NEUTROABS 20.2*  --   --   --   --   --   --   HGB 12.0  < > 10.5* 10.7* 9.3* 9.6* 10.3*  HCT 35.5*  < > 31.3* 32.8* 28.1* 29.3* 31.2*  MCV 100.0  < > 100.0 100.9* 101.8* 102.1* 102.0*  PLT 299  < > 251 260 205 176 187  < > = values in this interval not displayed. Cardiac Enzymes: No results for input(s): CKTOTAL, CKMB, CKMBINDEX, TROPONINI in the last 168 hours. BNP (last 3 results)  Recent Labs  12/05/14 0325  BNP 167.5*    ProBNP (last 3 results) No results for input(s): PROBNP in the last 8760 hours.  CBG:  Recent Labs Lab 12/08/14 2221 12/09/14 0741 12/09/14 1208 12/09/14 1640 12/09/14 2137  GLUCAP 172* 150* 189* 185* 172*    Recent  Results (from the past 240 hour(s))  Urine culture     Status: None   Collection Time: 11/30/14 10:30 PM  Result Value Ref Range Status   Specimen Description URINE, CATHETERIZED  Final   Special Requests NONE  Final   Culture   Final    >=100,000 COLONIES/mL ESCHERICHIA COLI Confirmed Extended Spectrum Beta-Lactamase Producer (ESBL) Performed at [facility]    Report Status 12/04/2014 FINAL  Final   Organism ID, Bacteria ESCHERICHIA COLI  Final      Susceptibility   Escherichia coli - MIC*    AMPICILLIN >=32 RESISTANT Resistant     CEFAZOLIN >=64 RESISTANT Resistant     CEFTRIAXONE >=64 RESISTANT Resistant     CIPROFLOXACIN >=4 RESISTANT Resistant     GENTAMICIN >=16 RESISTANT Resistant     IMIPENEM <=0.25 SENSITIVE Sensitive     NITROFURANTOIN <=16 SENSITIVE Sensitive     TRIMETH/SULFA <=20 SENSITIVE Sensitive  AMPICILLIN/SULBACTAM >=32 RESISTANT Resistant     PIP/TAZO >=128 RESISTANT Resistant     * >=100,000 COLONIES/mL ESCHERICHIA COLI  Blood culture (routine x 2)     Status: None   Collection Time: 12/04/14  6:00 PM  Result Value Ref Range Status   Specimen Description BLOOD LEFT HAND  Final   Special Requests BOTTLES DRAWN AEROBIC AND ANAEROBIC 5CC EACH  Final   Culture   Final    NO GROWTH 5 DAYS Performed at [facility]    Report Status 12/09/2014 FINAL  Final  Blood culture (routine x 2)     Status: None   Collection Time: 12/04/14  6:00 PM  Result Value Ref Range Status   Specimen Description BLOOD RIGHT HAND  Final   Special Requests BOTTLES DRAWN AEROBIC AND ANAEROBIC 5CC EACH  Final   Culture   Final    NO GROWTH 5 DAYS Performed at [facility]    Report Status 12/09/2014 FINAL  Final  MRSA PCR Screening     Status: Abnormal   Collection Time: 12/04/14 11:16 PM  Result Value Ref Range Status   MRSA by PCR POSITIVE (A) NEGATIVE Final    Comment:        The GeneXpert MRSA Assay (FDA approved for NASAL specimens only), is one  component of a comprehensive MRSA colonization surveillance program. It is not intended to diagnose MRSA infection nor to guide or monitor treatment for MRSA infections. RESULT CALLED TO, READ BACK BY AND VERIFIED WITH: NI,A/2W_[id number]  ON 12/05/14 BY KARCZEWSKI,S.   Urine culture     Status: None   Collection Time: 12/06/14 11:48 PM  Result Value Ref Range Status   Specimen Description URINE, CATHETERIZED  Final   Special Requests NONE  Final   Culture   Final    NO GROWTH 1 DAY Performed at [facility]    Report Status 12/08/2014 FINAL  Final  C difficile quick scan w PCR reflex     Status: Abnormal   Collection Time: 12/07/14  6:00 PM  Result Value Ref Range Status   C Diff antigen POSITIVE (A) NEGATIVE Final   C Diff toxin NEGATIVE NEGATIVE Final   C Diff interpretation   Final    C. difficile present, but toxin not detected. This indicates colonization. In most cases, this does not require treatment. If patient has signs and symptoms consistent with colitis, consider treatment. Requires ENTERIC precautions.     Studies: No results found.  Scheduled Meds: . antiseptic oral rinse  7 mL Mouth Rinse q12n4p  . beta carotene w/minerals  1 tablet Oral BID  . calcium-vitamin D  1 tablet Oral BID  . chlorhexidine  15 mL Mouth Rinse BID  . cholecalciferol  400 Units Oral Daily  . feeding supplement (ENSURE ENLIVE)  237 mL Oral TID BM  . furosemide  60 mg Oral 3 times per day  . gabapentin  300 mg Oral QHS  . imipenem-cilastatin  250 mg Intravenous Q6H  . insulin aspart  0-9 Units Subcutaneous TID WC  . multivitamin with minerals  1 tablet Oral Daily  . pantoprazole (PROTONIX) IV  40 mg Intravenous Q12H  . saccharomyces boulardii  250 mg Oral BID  . sodium chloride  1,000 mL Intravenous Once  . sodium chloride  3 mL Intravenous Q12H   Continuous Infusions:    Principal Problem:   Sepsis ([place]) Active Problems:   Macular degeneration (senile) of retina   Essential  hypertension  Atrial fibrillation (HCC)   Osteoporosis   GERD (gastroesophageal reflux disease)   DM (diabetes mellitus), type 2 with renal complications (HCC)   Acute renal failure superimposed on stage 3 chronic kidney disease (HCC)   Protein-calorie malnutrition (HCC)   Pressure ulcer, stage 3 (HCC)   Type 2 diabetes mellitus with diabetic polyneuropathy (HCC)   Constipation, chronic   Major depression, chronic (HCC)   Chronic diastolic CHF (congestive heart failure) (HCC)   UTI (lower urinary tract infection)   UGI bleed   Nausea & vomiting   Acute encephalopathy   Decubitus ulcer of ankle, stage 3 (HCC)   GI hemorrhage   Abnormal LFTs   Choledocholithiasis   Bile duct stone    Time spent: 25 minutes.     Niel Hummer A  [facility] Pager [phone]. If 7PM-7AM, please contact night-coverage at [email or web address], password [facility] 12/10/2014, 8:54 AM  LOS: 6 days

## 2014-12-10 NOTE — Progress Notes (Signed)
S: good UOP w lasix yest, 2550 ccUOP last 24 hours.  Pt w/o new complaints O:BP 145/56 mmHg  Pulse 90  Temp(Src) 98.3 F (36.8 C) (Oral)  Resp 16  Ht 5\' 10"  (1.778 m)  Wt 114 kg (251 lb 5.2 oz)  BMI 36.06 kg/m2  SpO2 100%  Intake/Output Summary (Last 24 hours) at 12/10/14 1609 Last data filed at 12/10/14 1231  Gross per 24 hour  Intake    710 ml  Output   3400 ml  Net  -2690 ml   Weight change: -4.4 kg (-9 lb 11.2 oz) ZWC:HENID and alert, forgetful CVS:RRR Resp:clear ant Abd:+ BS NTND Ext: +bilat UE edema, +bilat LE edema in dependent hip areas and thighs NEURO: CNI, no asterixis   . antiseptic oral rinse  7 mL Mouth Rinse q12n4p  . beta carotene w/minerals  1 tablet Oral BID  . calcium-vitamin D  1 tablet Oral BID  . chlorhexidine  15 mL Mouth Rinse BID  . cholecalciferol  400 Units Oral Daily  . feeding supplement (GLUCERNA SHAKE)  237 mL Oral BID BM  . furosemide  60 mg Oral 3 times per day  . gabapentin  300 mg Oral QHS  . imipenem-cilastatin  250 mg Intravenous Q6H  . insulin aspart  0-9 Units Subcutaneous TID WC  . metoprolol  25 mg Oral BID  . multivitamin with minerals  1 tablet Oral Daily  . pantoprazole (PROTONIX) IV  40 mg Intravenous Q12H  . saccharomyces boulardii  250 mg Oral BID  . sodium chloride  3 mL Intravenous Q12H   No results found. BMET    Component Value Date/Time   NA 138 12/10/2014 0410   NA 139 08/14/2014   K 3.5 12/10/2014 0410   CL 105 12/10/2014 0410   CO2 23 12/10/2014 0410   GLUCOSE 182* 12/10/2014 0410   BUN 60* 12/10/2014 0410   BUN 43* 08/14/2014   CREATININE 2.47* 12/10/2014 0410   CREATININE 1.4* 08/14/2014   CALCIUM 7.8* 12/10/2014 0410   CALCIUM 8.2* 10/10/2010 1613   GFRNONAA 17* 12/10/2014 0410   GFRAA 20* 12/10/2014 0410   CBC    Component Value Date/Time   WBC 7.6 12/10/2014 0410   WBC 4.8 08/14/2014   WBC 9.1 07/22/2011 1517   RBC 3.06* 12/10/2014 0410   RBC 3.65* 07/22/2011 1517   RBC 2.00* 10/11/2010  0500   HGB 10.3* 12/10/2014 0410   HGB 11.7 07/22/2011 1517   HCT 31.2* 12/10/2014 0410   HCT 33.4* 07/22/2011 1517   PLT 187 12/10/2014 0410   PLT 246 07/22/2011 1517   MCV 102.0* 12/10/2014 0410   MCV 91.5 07/22/2011 1517   MCH 33.7 12/10/2014 0410   MCH 32.1 07/22/2011 1517   MCHC 33.0 12/10/2014 0410   MCHC 35.0 07/22/2011 1517   RDW 13.2 12/10/2014 0410   RDW 14.6* 07/22/2011 1517   LYMPHSABS 0.6* 12/04/2014 1804   LYMPHSABS 1.2 07/22/2011 1517   MONOABS 0.4 12/04/2014 1804   MONOABS 0.6 07/22/2011 1517   EOSABS 0.0 12/04/2014 1804   EOSABS 0.5 07/22/2011 1517   BASOSABS 0.0 12/04/2014 1804   BASOSABS 0.1 07/22/2011 1517     Assessment: 1 Acute on CKD 3 - due to vol depletion/ hypotension/ ARB. Resolving, creat down 2.47 today. CKD with baseline creat 1.4.  Avoid ACEi/ARB indefinitely.  2 Bile duct stones SP removal and spincterotomy 3 Vol excess 3.DM 4 UTI on abx 5 Hypokalemia  Plan - have decreased po lasix to 60  mg tid for now. Taper dose as indicated clinically. No other suggestions, will sign off. Please call as needed.    [name] Splinter MD (pgr) [phone]    [phone] 12/10/2014, 4:12 PM

## 2014-12-11 DIAGNOSIS — E1121 Type 2 diabetes mellitus with diabetic nephropathy: Secondary | ICD-10-CM

## 2014-12-11 DIAGNOSIS — I5032 Chronic diastolic (congestive) heart failure: Secondary | ICD-10-CM

## 2014-12-11 DIAGNOSIS — N183 Chronic kidney disease, stage 3 (moderate): Secondary | ICD-10-CM

## 2014-12-11 DIAGNOSIS — L8993 Pressure ulcer of unspecified site, stage 3: Secondary | ICD-10-CM

## 2014-12-11 DIAGNOSIS — I10 Essential (primary) hypertension: Secondary | ICD-10-CM

## 2014-12-11 DIAGNOSIS — N390 Urinary tract infection, site not specified: Secondary | ICD-10-CM

## 2014-12-11 DIAGNOSIS — E46 Unspecified protein-calorie malnutrition: Secondary | ICD-10-CM

## 2014-12-11 DIAGNOSIS — K8030 Calculus of bile duct with cholangitis, unspecified, without obstruction: Secondary | ICD-10-CM

## 2014-12-11 DIAGNOSIS — G9340 Encephalopathy, unspecified: Secondary | ICD-10-CM

## 2014-12-11 DIAGNOSIS — A4151 Sepsis due to Escherichia coli [E. coli]: Secondary | ICD-10-CM

## 2014-12-11 DIAGNOSIS — N179 Acute kidney failure, unspecified: Secondary | ICD-10-CM

## 2014-12-11 DIAGNOSIS — E1142 Type 2 diabetes mellitus with diabetic polyneuropathy: Secondary | ICD-10-CM

## 2014-12-11 LAB — BASIC METABOLIC PANEL
Anion gap: 12 (ref 5–15)
BUN: 55 mg/dL — AB (ref 6–20)
CHLORIDE: 102 mmol/L (ref 101–111)
CO2: 23 mmol/L (ref 22–32)
CREATININE: 2.23 mg/dL — AB (ref 0.44–1.00)
Calcium: 7.8 mg/dL — ABNORMAL LOW (ref 8.9–10.3)
GFR calc Af Amer: 23 mL/min — ABNORMAL LOW (ref >60)
GFR calc non Af Amer: 20 mL/min — ABNORMAL LOW (ref >60)
Glucose, Bld: 180 mg/dL — ABNORMAL HIGH (ref 65–99)
Potassium: 3.1 mmol/L — ABNORMAL LOW (ref 3.5–5.1)
SODIUM: 137 mmol/L (ref 135–145)

## 2014-12-11 LAB — GLUCOSE, CAPILLARY
Glucose-Capillary: 152 mg/dL — ABNORMAL HIGH (ref 65–99)
Glucose-Capillary: 182 mg/dL — ABNORMAL HIGH (ref 65–99)
Glucose-Capillary: 127 mg/dL — ABNORMAL HIGH (ref 65–99)
Glucose-Capillary: 131 mg/dL — ABNORMAL HIGH (ref 65–99)

## 2014-12-11 LAB — CBC
HCT: 30.7 % — ABNORMAL LOW (ref 36.0–46.0)
HEMOGLOBIN: 10.2 g/dL — AB (ref 12.0–15.0)
MCH: 33.4 pg (ref 26.0–34.0)
MCHC: 33.2 g/dL (ref 30.0–36.0)
MCV: 100.7 fL — ABNORMAL HIGH (ref 78.0–100.0)
PLATELETS: 187 10*3/uL (ref 150–400)
RBC: 3.05 MIL/uL — ABNORMAL LOW (ref 3.87–5.11)
RDW: 13.1 % (ref 11.5–15.5)
WBC: 6 10*3/uL (ref 4.0–10.5)

## 2014-12-11 MED ORDER — POTASSIUM CHLORIDE CRYS ER 20 MEQ PO TBCR
40.0000 meq | EXTENDED_RELEASE_TABLET | Freq: Every day | ORAL | Status: DC
  Administered 2014-12-11 – 2014-12-13 (×3): 40 meq via ORAL
  Filled 2014-12-11 (×3): qty 2

## 2014-12-11 MED ORDER — PANTOPRAZOLE SODIUM 40 MG PO TBEC
40.0000 mg | DELAYED_RELEASE_TABLET | Freq: Every day | ORAL | Status: DC
  Administered 2014-12-12 – 2014-12-13 (×2): 40 mg via ORAL
  Filled 2014-12-11 (×2): qty 1

## 2014-12-11 NOTE — Care Management Important Message (Signed)
Important Message  Patient Details IM Letter given to Nora/Case Manager to present to [place] Message  Patient Details  Name: Kathryn [name] MRN: [id number] Date of Birth: 1933/12/14   Medicare Important Message Given:  Yes-second notification given    Camillo Flaming 12/11/2014, 11:27 AM Name: Kathryn [name] MRN: [id number] Date of Birth: Jun 21, 1933   Medicare Important Message Given:  Yes-second notification given    Camillo Flaming 12/11/2014, 11:27 AM

## 2014-12-11 NOTE — Progress Notes (Signed)
Repage IV team after talking to MD [facility]. Patient needs 10 days of IV antibiotics. Lost IV access this am. IV team on nights unable to restart.

## 2014-12-11 NOTE — Progress Notes (Signed)
CSW continuing to follow.  Pt admitted from [facility] where pt is a long term resident and plan is for pt to return when medically stable for discharge.  MD requested to be in contact with [facility] to discuss if pt can return to [facility] with peripheral IV tomorrow for three more days of IV antibiotic. MD spoke with [place] and MD plans to place IV today and reassess with [facility] tomorrow if [facility] can accept pt with peripheral IV.   CSW to continue to follow to provide support and assist with pt disposition planning.   Alison Murray, MSW, [place] Work [phone]

## 2014-12-11 NOTE — Progress Notes (Signed)
[facility] PROGRESS NOTE  Kathryn [name] YFV:[id number] DOB: [birth year] DOA: 12/04/2014 PCP: Blanchie Serve, MD  Assessment/Plan: Sepsis: Likely due  UTI. Patient has been treated with Keflex for positive urinalysis with Escherichia coli, which are resistant to lactamase urine culture 9-29.  -Continue with  IV Primaxin -lactic acid WNL now -neg blood cx data  -discontinue vancomycin on 10-01.    E. Coli UTI: -E coli from culture 9-24, multiresistant, sensitive to imipenem.  -Day 7 of IV antibiotics. Need treatment for complicated UTI due to foley catheter for a total of 10 days -5 of these with dedicated antibiotic base on cx sensitivity  -continue imipenem   Transaminases:  -GTT elevated, Alk phosphatase elevated  -RUQ [place]: No acute findings. No biliary dilatation status post cholecystectomy. -GI consulted. MRCP with filling defect. S/P ERCP, extraction with multiple stones.  -LF trending down.  -diet advanced and tolerated   Acute encephalopathy: Likely due to sepsis and UTI. -Treat UTI and sepsis as above -ammonia level at 11. Ammonia level done at her facility was 74.  -Changed oxycodone to PRN and will minimize use of narcotics/sedatives meds. -no more lactulose, due to diarrhea.   AoCKD-III-IV: Baseline Cr is 1.4 , her Cr is 4.49 on admission. Hemodynamic related, hypotension, infection, sepsis, also continue use of ACE.  -continue holding lisinopril.  -cr trending down. [place] at 2.23 -foley catheter place 9-30.  -appreciate nephrologist assistance and rec's.  -Started on lasix 10-03; will now titrated dose as needed for edema  Diarrhea;  Resolved, in setting of laxatives. C diff antigen positive. Per GI no need to treat for C diff infection.   -continue contact  Possible UGI: Nurse noticed coffee-ground material from her vomiting. Differential : acute gastritis or stress ulcers. Patient does not have history of cirrhosis. Her hemoglobin is 12.6 on 11/29/14-->12.0.   -continue Protonix, but will switch to PO -Avoid NSAIDs and SQ heparin -Hb stable.  -GI consulted. No need for endoscopy at this point.  -no further episodes of overt bleeding appreciated  Atrial Fibrillation: CHA2DS2-VASc Score is 6, needs oral anticoagulation. Patient is not on AC at home (unclear reason), now presents with possible GIB, which prevent initiation of anticoagulants.  -resume metoprolol at lower dose to avoid rebound tachycardia    DM-II: Last A1c 5.8 on 06/04/14.  -continue SSI  HTN: -Hold amlodipine and lisinopril since patient blood pressure has been soft due to sepsis and ongoing diuresis -will monitor and adjust antihypertensive agents as needed  Protein-calorie malnutrition: -continue Ensure TID  Chronic diastolic CHF (congestive heart failure): compensated.  - BNP; 167 -on metoprolol low dose.  -follow strict intake and output  Decubitus ulcer-stage 3: -Wound care consulted, will follow rec's for local care -preventive measures     Code Status: DNR Family Communication: care discussed with son 10-02, discussed with patient 10-04 Disposition Plan: transfer to Med-surgery , PT consult, monitor renal function.    Consultants:  GI  Nephrologist  Procedures:  Abdominal US  Antibiotics:  Vancomycin 9/28>> 10/01  Imipenem 12/09/14>>03/15/14  Flagyl 10-03---10-04  HPI/Subjective: Denies abdominal pain, no diarrhea. Denies CP and SOB. Patient with good urine output and improvement in her Cr level  Objective: Filed Vitals:   12/11/14 1006  BP: 159/89  Pulse: 97  Temp:   Resp:     Intake/Output Summary (Last 24 hours) at 12/11/14 1733 Last data filed at 12/11/14 1700  Gross per 24 hour  Intake    520 ml  Output   1650 ml  Net  -1130 ml   Filed Weights   12/10/14 0423 12/10/14 1500 12/11/14 0726  Weight: 114 kg (251 lb 5.2 oz) 113.399 kg (250 lb) 116.8 kg (257 lb 8 oz)    Exam:   General:  Alert in no distress, afebrile and  feeling better overall.   Cardiovascular: irregular rhythm, rate controlled, no rubs or gallops  Respiratory: CTA bilaterally  Abdomen: BS present, mildly distended.   Musculoskeletal: edema 4 extremities (1-2++)  Skin: decubitus ulcer stage 3, no signs of superimposed cellulitic process appreciated  Data Reviewed: Basic Metabolic Panel:  Recent Labs Lab 12/07/14 0405 12/08/14 0643 12/09/14 0345 12/10/14 0410 12/11/14 0416  NA 137 133* 133* 138 137  K 4.2 3.3* 3.7 3.5 3.1*  CL 102 104 104 105 102  CO2 22 23 20* 23 23  GLUCOSE 239* 271* 168* 182* 180*  BUN 60* 59* 56* 60* 55*  CREATININE 3.48* 2.94* 2.68* 2.47* 2.23*  CALCIUM 8.6* 7.4* 7.6* 7.8* 7.8*   Liver Function Tests:  Recent Labs Lab 12/05/14 0325 12/06/14 0840 12/07/14 0405 12/08/14 0643 12/09/14 0345  AST 39 $Remo'29 24 24 25  'UcXIa$ ALT 37 $Remo'29 27 18 'fqFRj$ 13*  ALKPHOS 519* 414* 406* 283* 279*  BILITOT 1.4* 1.1 1.3* 1.0 0.8  PROT 5.8* 5.6* 5.8* 4.7* 5.1*  ALBUMIN 2.7* 2.4* 2.6* 2.0* 2.5*    Recent Labs Lab 12/05/14 0130 12/06/14 0840  LIPASE 12* 18*    Recent Labs Lab 12/04/14 1805  AMMONIA 11   CBC:  Recent Labs Lab 12/04/14 1804  12/07/14 0405 12/08/14 0643 12/09/14 0345 12/10/14 0410 12/11/14 0416  WBC 21.2*  < > 14.9* 9.8 8.6 7.6 6.0  NEUTROABS 20.2*  --   --   --   --   --   --   HGB 12.0  < > 10.7* 9.3* 9.6* 10.3* 10.2*  HCT 35.5*  < > 32.8* 28.1* 29.3* 31.2* 30.7*  MCV 100.0  < > 100.9* 101.8* 102.1* 102.0* 100.7*  PLT 299  < > 260 205 176 187 187  < > = values in this interval not displayed.  BNP (last 3 results)  Recent Labs  12/05/14 0325  BNP 167.5*    ProBNP (last 3 results) No results for input(s): PROBNP in the last 8760 hours.  CBG:  Recent Labs Lab 12/10/14 0833 12/10/14 1206 12/10/14 1732 12/10/14 2116 12/11/14 0744  GLUCAP 166* 157* 117* 139* 152*    Recent Results (from the past 240 hour(s))  Blood culture (routine x 2)     Status: None   Collection Time:  12/04/14  6:00 PM  Result Value Ref Range Status   Specimen Description BLOOD LEFT HAND  Final   Special Requests BOTTLES DRAWN AEROBIC AND ANAEROBIC 5CC EACH  Final   Culture   Final    NO GROWTH 5 DAYS Performed at [facility]    Report Status 12/09/2014 FINAL  Final  Blood culture (routine x 2)     Status: None   Collection Time: 12/04/14  6:00 PM  Result Value Ref Range Status   Specimen Description BLOOD RIGHT HAND  Final   Special Requests BOTTLES DRAWN AEROBIC AND ANAEROBIC 5CC EACH  Final   Culture   Final    NO GROWTH 5 DAYS Performed at [facility]    Report Status 12/09/2014 FINAL  Final  MRSA PCR Screening     Status: Abnormal   Collection Time: 12/04/14 11:16 PM  Result Value Ref Range Status  MRSA by PCR POSITIVE (A) NEGATIVE Final    Comment:        The GeneXpert MRSA Assay (FDA approved for NASAL specimens only), is one component of a comprehensive MRSA colonization surveillance program. It is not intended to diagnose MRSA infection nor to guide or monitor treatment for MRSA infections. RESULT CALLED TO, READ BACK BY AND VERIFIED WITH: NI,A/2W@0315  ON 12/05/14 BY KARCZEWSKI,S.   Urine culture     Status: None   Collection Time: 12/06/14 11:48 PM  Result Value Ref Range Status   Specimen Description URINE, CATHETERIZED  Final   Special Requests NONE  Final   Culture   Final    NO GROWTH 1 DAY Performed at [facility]    Report Status 12/08/2014 FINAL  Final  C difficile quick scan w PCR reflex     Status: Abnormal   Collection Time: 12/07/14  6:00 PM  Result Value Ref Range Status   C Diff antigen POSITIVE (A) NEGATIVE Final   C Diff toxin NEGATIVE NEGATIVE Final   C Diff interpretation   Final    C. difficile present, but toxin not detected. This indicates colonization. In most cases, this does not require treatment. If patient has signs and symptoms consistent with colitis, consider treatment. Requires ENTERIC precautions.      Studies: No results found.  Scheduled Meds: . antiseptic oral rinse  7 mL Mouth Rinse q12n4p  . beta carotene w/minerals  1 tablet Oral BID  . calcium-vitamin D  1 tablet Oral BID  . chlorhexidine  15 mL Mouth Rinse BID  . cholecalciferol  400 Units Oral Daily  . feeding supplement (GLUCERNA SHAKE)  237 mL Oral BID BM  . furosemide  60 mg Oral 3 times per day  . gabapentin  300 mg Oral QHS  . imipenem-cilastatin  250 mg Intravenous Q6H  . insulin aspart  0-9 Units Subcutaneous TID WC  . metoprolol  25 mg Oral BID  . multivitamin with minerals  1 tablet Oral Daily  . pantoprazole (PROTONIX) IV  40 mg Intravenous Q12H  . potassium chloride  40 mEq Oral Daily  . saccharomyces boulardii  250 mg Oral BID  . sodium chloride  3 mL Intravenous Q12H   Continuous Infusions:    Principal Problem:   Sepsis ([place]) Active Problems:   Macular degeneration (senile) of retina   Essential hypertension   Atrial fibrillation (HCC)   Osteoporosis   GERD (gastroesophageal reflux disease)   DM (diabetes mellitus), type 2 with renal complications (HCC)   Acute renal failure superimposed on stage 3 chronic kidney disease (HCC)   Protein-calorie malnutrition (HCC)   Pressure ulcer, stage 3 (HCC)   Type 2 diabetes mellitus with diabetic polyneuropathy (HCC)   Constipation, chronic   Major depression, chronic (HCC)   Chronic diastolic CHF (congestive heart failure) (HCC)   UTI (lower urinary tract infection)   UGI bleed   Nausea & vomiting   Acute encephalopathy   Decubitus ulcer of ankle, stage 3 (HCC)   GI hemorrhage   Abnormal LFTs   Choledocholithiasis   Bile duct stone    Time spent: 25 minutes.     Barton Dubois  [facility] Pager [phone]. If 7PM-7AM, please contact night-coverage at [email or web address], password [facility] 12/11/2014, 5:33 PM  LOS: 7 days

## 2014-12-12 DIAGNOSIS — F329 Major depressive disorder, single episode, unspecified: Secondary | ICD-10-CM

## 2014-12-12 DIAGNOSIS — I482 Chronic atrial fibrillation: Secondary | ICD-10-CM

## 2014-12-12 DIAGNOSIS — K219 Gastro-esophageal reflux disease without esophagitis: Secondary | ICD-10-CM

## 2014-12-12 LAB — GLUCOSE, CAPILLARY
GLUCOSE-CAPILLARY: 164 mg/dL — AB (ref 65–99)
Glucose-Capillary: 154 mg/dL — ABNORMAL HIGH (ref 65–99)
Glucose-Capillary: 113 mg/dL — ABNORMAL HIGH (ref 65–99)

## 2014-12-12 NOTE — Progress Notes (Signed)
ANTIBIOTIC CONSULT NOTE - FOLLOW UP  Pharmacy Consult for Primaxin Indication: ESBL UTI, sepsis  Allergies  Allergen Reactions  . Celebrex [Celecoxib]   . Nsaids   . Sulfa Antibiotics     Patient Measurements: Height: 5\' 10"  (177.8 cm) Weight: 256 lb 9.9 oz (116.4 kg) IBW/kg (Calculated) : 68.5  Vital Signs: Temp: 98.6 F (37 C) (10/06 0547) Temp Source: Oral (10/06 0547) BP: 137/74 mmHg (10/06 0800) Pulse Rate: 62 (10/06 0800) Intake/Output from previous day: 10/05 0701 - 10/06 0700 In: 875 [P.O.:150; IV Piggyback:450] Out: 2250 [Urine:2250] Intake/Output from this shift:    Labs:  Recent Labs  12/10/14 0410 12/11/14 0416  WBC 7.6 6.0  HGB 10.3* 10.2*  PLT 187 187  CREATININE 2.47* 2.23*   Estimated Creatinine Clearance: 28.3 mL/min (by C-G formula based on Cr of 2.23). No results for input(s): VANCOTROUGH, VANCOPEAK, VANCORANDOM, GENTTROUGH, GENTPEAK, GENTRANDOM, TOBRATROUGH, TOBRAPEAK, TOBRARND, AMIKACINPEAK, AMIKACINTROU, AMIKACIN in the last 72 hours.   Microbiology: Recent Results (from the past 720 hour(s))  Urine culture     Status: None   Collection Time: 11/30/14 10:30 PM  Result Value Ref Range Status   Specimen Description URINE, CATHETERIZED  Final   Special Requests NONE  Final   Culture   Final    >=100,000 COLONIES/mL ESCHERICHIA COLI Confirmed Extended Spectrum Beta-Lactamase Producer (ESBL) Performed at [facility]    Report Status 12/04/2014 FINAL  Final   Organism ID, Bacteria ESCHERICHIA COLI  Final      Susceptibility   Escherichia coli - MIC*    AMPICILLIN >=32 RESISTANT Resistant     CEFAZOLIN >=64 RESISTANT Resistant     CEFTRIAXONE >=64 RESISTANT Resistant     CIPROFLOXACIN >=4 RESISTANT Resistant     GENTAMICIN >=16 RESISTANT Resistant     IMIPENEM <=0.25 SENSITIVE Sensitive     NITROFURANTOIN <=16 SENSITIVE Sensitive     TRIMETH/SULFA <=20 SENSITIVE Sensitive     AMPICILLIN/SULBACTAM >=32 RESISTANT Resistant    PIP/TAZO >=128 RESISTANT Resistant     * >=100,000 COLONIES/mL ESCHERICHIA COLI  Blood culture (routine x 2)     Status: None   Collection Time: 12/04/14  6:00 PM  Result Value Ref Range Status   Specimen Description BLOOD LEFT HAND  Final   Special Requests BOTTLES DRAWN AEROBIC AND ANAEROBIC 5CC EACH  Final   Culture   Final    NO GROWTH 5 DAYS Performed at [facility]    Report Status 12/09/2014 FINAL  Final  Blood culture (routine x 2)     Status: None   Collection Time: 12/04/14  6:00 PM  Result Value Ref Range Status   Specimen Description BLOOD RIGHT HAND  Final   Special Requests BOTTLES DRAWN AEROBIC AND ANAEROBIC 5CC EACH  Final   Culture   Final    NO GROWTH 5 DAYS Performed at [facility]    Report Status 12/09/2014 FINAL  Final  MRSA PCR Screening     Status: Abnormal   Collection Time: 12/04/14 11:16 PM  Result Value Ref Range Status   MRSA by PCR POSITIVE (A) NEGATIVE Final    Comment:        The GeneXpert MRSA Assay (FDA approved for NASAL specimens only), is one component of a comprehensive MRSA colonization surveillance program. It is not intended to diagnose MRSA infection nor to guide or monitor treatment for MRSA infections. RESULT CALLED TO, READ BACK BY AND VERIFIED WITH: NI,A/2W@0315  ON 12/05/14 BY KARCZEWSKI,S.   Urine  culture     Status: None   Collection Time: 12/06/14 11:48 PM  Result Value Ref Range Status   Specimen Description URINE, CATHETERIZED  Final   Special Requests NONE  Final   Culture   Final    NO GROWTH 1 DAY Performed at [facility]    Report Status 12/08/2014 FINAL  Final  C difficile quick scan w PCR reflex     Status: Abnormal   Collection Time: 12/07/14  6:00 PM  Result Value Ref Range Status   C Diff antigen POSITIVE (A) NEGATIVE Final   C Diff toxin NEGATIVE NEGATIVE Final   C Diff interpretation   Final    C. difficile present, but toxin not detected. This indicates colonization. In most  cases, this does not require treatment. If patient has signs and symptoms consistent with colitis, consider treatment. Requires ENTERIC precautions.   Assessment: Patient's a 81 y.o F with ESBL UTI from ucx on 9/24 and was treated with keflex PTA. She was referred to the ED on 9/28 by her PCP for further workup of elevated LFTs and ammonia level. In the ED, she was found to have elevated WBC and LA. Started abx for suspected sepsis secondary to UTI.  9/28 >> Vanc >> 10/2 9/28 >> Primaxin >> 10/2 >> Flagyl >> 10/3  9/28 blood x 2: NGF 9/28 MRSA PCR: positive 9/30 urine: NGF 10/1 C diff antigen positive, toxin negative 9/24 urine: >100K ESBL Ecoli (S= primaxin, NTF, bactrim)  Today, 12/12/2014: Remains afebrile WBC improved to WNL As of 10/5, SCr was slowly improving, CrCl ~18ml/min/1.73m2 (normalized). Baseline SCr ~1.4.  Goal of Therapy:  Primaxin dose per weight and renal function  Plan:  Day #9 Primaxin  Cont Primaxin 250mg  IV q6h. Even if SCr improves to baseline, this regimen would still be optimal.  F/u renal function and adjust as needed.  Romeo Rabon, PharmD, pager [phone]. 12/12/2014,9:31 AM.

## 2014-12-12 NOTE — Progress Notes (Signed)
[facility] PROGRESS NOTE  Kathryn [name] NHA:[id number] DOB: May 25, 1933 DOA: 12/04/2014 PCP: Blanchie Serve, MD  Assessment/Plan: Sepsis: Likely due  UTI. Patient has been treated with Keflex for positive urinalysis with Escherichia coli, which are resistant to lactamase urine culture 9-29.  -Continue with  IV Primaxin -lactic acid WNL now -neg blood cx data  -discontinue vancomycin on 10-01.    E. Coli UTI: -E coli from culture 9-24, multiresistant, sensitive to imipenem.  -Day 8 of IV antibiotics. Need treatment for complicated UTI due to foley catheter for a total of 10 days -5 of these with dedicated antibiotic therapy base on culture sensitivity  -continue imipenem   Transaminases:  -GTT elevated, Alk phosphatase elevated  -RUQ [place]: No acute findings. No biliary dilatation status post cholecystectomy. -GI consulted. MRCP with filling defect. S/P ERCP, extraction with multiple stones.  -LF trending down.  -diet advanced and tolerated   Acute encephalopathy: Likely due to sepsis and UTI. -Treat UTI and sepsis as above -ammonia level at 11. Ammonia level done at her facility was 74.  -Changed oxycodone to PRN and will minimize use of narcotics/sedatives meds. -no more lactulose, due to diarrhea.   AoCKD-III-IV: Baseline Cr is 1.4 , her Cr is 4.49 on admission. Hemodynamic related, hypotension, infection, sepsis, also continue use of ACE.  -continue holding lisinopril.  -cr trending down. Last at 2.23 -foley catheter replace 9-30.  -appreciate nephrologist assistance and rec's.  -Started on lasix 10-03; will now titrated dose as needed for edema; currently on lasix TID, will change to BID at discharge  Diarrhea;  Resolved, in setting of laxatives. C diff antigen positive. Per GI no need to treat for C diff infection.   -continue contact  Possible UGI: Nurse noticed coffee-ground material from her vomiting. Differential : acute gastritis or stress ulcers. Patient does  not have history of cirrhosis. Her hemoglobin is 12.6 on 11/29/14-->12.0.  -continue Protonix, but will switch to PO -Avoid NSAIDs and SQ heparin -Hb stable.  -GI consulted. No need for endoscopy at this point.  -no further episodes of overt bleeding appreciated  Atrial Fibrillation: CHA2DS2-VASc Score is 6, needs oral anticoagulation. Patient is not on AC at home (unclear reason), now presents with possible GIB, which prevent initiation of anticoagulants.  -resume metoprolol at lower dose to avoid rebound tachycardia    DM-II: Last A1c 5.8 on 06/04/14.  -continue SSI  HTN: -Hold amlodipine and lisinopril since patient blood pressure has been soft due to sepsis and ongoing diuresis -will monitor and adjust antihypertensive agents as needed  Protein-calorie malnutrition: -continue Ensure TID  Chronic diastolic CHF (congestive heart failure): compensated.  -BNP; 167 -on metoprolol low dose.  -follow strict intake and output -no SOB and no crackles on exam  Decubitus ulcer-stage 3: -Wound care consulted, will follow rec's for local care -preventive measures     Code Status: DNR Family Communication: care discussed with son 10-02, discussed with patient 10-04 Disposition Plan: transfer to Med-surgery , PT consult, monitor renal function.    Consultants:  GI  Nephrologist  Procedures:  Abdominal US  Antibiotics:  Vancomycin 9/28>> 10/01  Imipenem 12/09/14>>03/15/14  Flagyl 10-03---10-04  HPI/Subjective: Denies abdominal pain, no diarrhea. Denies CP and SOB. Patient with good urine output and continue improvement in her renal function  Objective: Filed Vitals:   12/12/14 0940  BP: 137/74  Pulse: 64  Temp:   Resp:     Intake/Output Summary (Last 24 hours) at 12/12/14 1425 Last data filed at 12/12/14 8329  Gross per 24 hour  Intake    708 ml  Output   1850 ml  Net  -1142 ml   Filed Weights   12/10/14 1500 12/11/14 0726 12/12/14 0405  Weight: 113.399 kg  (250 lb) 116.8 kg (257 lb 8 oz) 116.4 kg (256 lb 9.9 oz)    Exam:   General:  Alert in no distress, afebrile and feeling much better. Denies CP and SOB.  Cardiovascular: irregular rhythm, rate controlled, no rubs or gallops  Respiratory: CTA bilaterally  Abdomen: BS present, mildly distended.   Musculoskeletal: edema 4 extremities (1-2++)  Skin: decubitus ulcer stage 3, no signs of superimposed cellulitic process appreciated  Data Reviewed: Basic Metabolic Panel:  Recent Labs Lab 12/07/14 0405 12/08/14 0643 12/09/14 0345 12/10/14 0410 12/11/14 0416  NA 137 133* 133* 138 137  K 4.2 3.3* 3.7 3.5 3.1*  CL 102 104 104 105 102  CO2 22 23 20* 23 23  GLUCOSE 239* 271* 168* 182* 180*  BUN 60* 59* 56* 60* 55*  CREATININE 3.48* 2.94* 2.68* 2.47* 2.23*  CALCIUM 8.6* 7.4* 7.6* 7.8* 7.8*   Liver Function Tests:  Recent Labs Lab 12/06/14 0840 12/07/14 0405 12/08/14 0643 12/09/14 0345  AST _0 ALT _1 13*  ALKPHOS 414* 406* 283* 279*  BILITOT 1.1 1.3* 1.0 0.8  PROT 5.6* 5.8* 4.7* 5.1*  ALBUMIN 2.4* 2.6* 2.0* 2.5*    Recent Labs Lab 12/06/14 0840  LIPASE 18*   No results for input(s): AMMONIA in the last 168 hours. CBC:  Recent Labs Lab 12/07/14 0405 12/08/14 0643 12/09/14 0345 12/10/14 0410 12/11/14 0416  WBC 14.9* 9.8 8.6 7.6 6.0  HGB 10.7* 9.3* 9.6* 10.3* 10.2*  HCT 32.8* 28.1* 29.3* 31.2* 30.7*  MCV 100.9* 101.8* 102.1* 102.0* 100.7*  PLT 260 205 176 187 187    BNP (last 3 results)  Recent Labs  12/05/14 0325  BNP 167.5*    ProBNP (last 3 results) No results for input(s): PROBNP in the last 8760 hours.  CBG:  Recent Labs Lab 12/11/14 1207 12/11/14 1737 12/11/14 2056 12/12/14 0726 12/12/14 1227  GLUCAP 182* 127* 131* 164* 154*    Recent Results (from the past 240 hour(s))  Blood culture (routine x 2)     Status: None   Collection Time: 12/04/14  6:00 PM  Result Value Ref Range Status   Specimen Description BLOOD  LEFT HAND  Final   Special Requests BOTTLES DRAWN AEROBIC AND ANAEROBIC 5CC EACH  Final   Culture   Final    NO GROWTH 5 DAYS Performed at [facility]    Report Status 12/09/2014 FINAL  Final  Blood culture (routine x 2)     Status: None   Collection Time: 12/04/14  6:00 PM  Result Value Ref Range Status   Specimen Description BLOOD RIGHT HAND  Final   Special Requests BOTTLES DRAWN AEROBIC AND ANAEROBIC 5CC EACH  Final   Culture   Final    NO GROWTH 5 DAYS Performed at [facility]    Report Status 12/09/2014 FINAL  Final  MRSA PCR Screening     Status: Abnormal   Collection Time: 12/04/14 11:16 PM  Result Value Ref Range Status   MRSA by PCR POSITIVE (A) NEGATIVE Final    Comment:        The GeneXpert MRSA Assay (FDA approved for NASAL specimens only), is one component of a comprehensive MRSA colonization surveillance program. It is not intended  to diagnose MRSA infection nor to guide or monitor treatment for MRSA infections. RESULT CALLED TO, READ BACK BY AND VERIFIED WITH: NI,A/2W_0  ON 12/05/14 BY KARCZEWSKI,S.   Urine culture     Status: None   Collection Time: 12/06/14 11:48 PM  Result Value Ref Range Status   Specimen Description URINE, CATHETERIZED  Final   Special Requests NONE  Final   Culture   Final    NO GROWTH 1 DAY Performed at [facility]    Report Status 12/08/2014 FINAL  Final  C difficile quick scan w PCR reflex     Status: Abnormal   Collection Time: 12/07/14  6:00 PM  Result Value Ref Range Status   C Diff antigen POSITIVE (A) NEGATIVE Final   C Diff toxin NEGATIVE NEGATIVE Final   C Diff interpretation   Final    C. difficile present, but toxin not detected. This indicates colonization. In most cases, this does not require treatment. If patient has signs and symptoms consistent with colitis, consider treatment. Requires ENTERIC precautions.     Studies: No results found.  Scheduled Meds: . antiseptic oral rinse   7 mL Mouth Rinse q12n4p  . beta carotene w/minerals  1 tablet Oral BID  . calcium-vitamin D  1 tablet Oral BID  . chlorhexidine  15 mL Mouth Rinse BID  . cholecalciferol  400 Units Oral Daily  . feeding supplement (GLUCERNA SHAKE)  237 mL Oral BID BM  . furosemide  60 mg Oral 3 times per day  . gabapentin  300 mg Oral QHS  . imipenem-cilastatin  250 mg Intravenous Q6H  . insulin aspart  0-9 Units Subcutaneous TID WC  . metoprolol  25 mg Oral BID  . multivitamin with minerals  1 tablet Oral Daily  . pantoprazole  40 mg Oral Q1200  . potassium chloride  40 mEq Oral Daily  . saccharomyces boulardii  250 mg Oral BID  . sodium chloride  3 mL Intravenous Q12H   Continuous Infusions:    Principal Problem:   Sepsis ([place]) Active Problems:   Macular degeneration (senile) of retina   Essential hypertension   Atrial fibrillation (HCC)   Osteoporosis   GERD (gastroesophageal reflux disease)   DM (diabetes mellitus), type 2 with renal complications (HCC)   Acute renal failure superimposed on stage 3 chronic kidney disease (HCC)   Protein-calorie malnutrition (HCC)   Pressure ulcer, stage 3 (HCC)   Type 2 diabetes mellitus with diabetic polyneuropathy (HCC)   Constipation, chronic   Major depression, chronic (HCC)   Chronic diastolic CHF (congestive heart failure) (HCC)   UTI (lower urinary tract infection)   UGI bleed   Nausea & vomiting   Acute encephalopathy   Decubitus ulcer of ankle, stage 3 (HCC)   GI hemorrhage   Abnormal LFTs   Choledocholithiasis   Bile duct stone    Time spent: 25 minutes.     Barton Dubois  [facility] Pager [phone]. If 7PM-7AM, please contact night-coverage at [email or web address], password [facility] 12/12/2014, 2:25 PM  LOS: 8 days

## 2014-12-12 NOTE — Progress Notes (Signed)
Patient refused 2200 meds stating she was "tired".

## 2014-12-13 DIAGNOSIS — Z794 Long term (current) use of insulin: Secondary | ICD-10-CM

## 2014-12-13 DIAGNOSIS — L89503 Pressure ulcer of unspecified ankle, stage 3: Secondary | ICD-10-CM

## 2014-12-13 LAB — CBC
HCT: 30.9 % — ABNORMAL LOW (ref 36.0–46.0)
Hemoglobin: 10.5 g/dL — ABNORMAL LOW (ref 12.0–15.0)
MCH: 33.8 pg (ref 26.0–34.0)
MCHC: 34 g/dL (ref 30.0–36.0)
MCV: 99.4 fL (ref 78.0–100.0)
PLATELETS: 177 10*3/uL (ref 150–400)
RBC: 3.11 MIL/uL — AB (ref 3.87–5.11)
RDW: 13 % (ref 11.5–15.5)
WBC: 6.7 10*3/uL (ref 4.0–10.5)

## 2014-12-13 LAB — BASIC METABOLIC PANEL
ANION GAP: 10 (ref 5–15)
BUN: 42 mg/dL — ABNORMAL HIGH (ref 6–20)
CALCIUM: 7.6 mg/dL — AB (ref 8.9–10.3)
CO2: 25 mmol/L (ref 22–32)
Chloride: 101 mmol/L (ref 101–111)
Creatinine, Ser: 1.78 mg/dL — ABNORMAL HIGH (ref 0.44–1.00)
GFR, EST AFRICAN AMERICAN: 30 mL/min — AB (ref >60)
GFR, EST NON AFRICAN AMERICAN: 26 mL/min — AB (ref >60)
GLUCOSE: 143 mg/dL — AB (ref 65–99)
POTASSIUM: 3.3 mmol/L — AB (ref 3.5–5.1)
Sodium: 136 mmol/L (ref 135–145)

## 2014-12-13 LAB — GLUCOSE, CAPILLARY
GLUCOSE-CAPILLARY: 155 mg/dL — AB (ref 65–99)
GLUCOSE-CAPILLARY: 141 mg/dL — AB (ref 65–99)
Glucose-Capillary: 175 mg/dL — ABNORMAL HIGH (ref 65–99)

## 2014-12-13 MED ORDER — SACCHAROMYCES BOULARDII 250 MG PO CAPS: 250.0000 mg | ORAL_CAPSULE | Freq: Two times a day (BID) | ORAL | Status: AC

## 2014-12-13 MED ORDER — POLYETHYLENE GLYCOL 3350 17 G PO PACK: 17.0000 g | PACK | Freq: Every day | ORAL | Status: AC | PRN

## 2014-12-13 MED ORDER — POTASSIUM CHLORIDE CRYS ER 20 MEQ PO TBCR: 40.0000 meq | EXTENDED_RELEASE_TABLET | Freq: Every day | ORAL | Status: AC

## 2014-12-13 MED ORDER — METOPROLOL TARTRATE 25 MG PO TABS: 25.0000 mg | ORAL_TABLET | Freq: Two times a day (BID) | ORAL | Status: AC

## 2014-12-13 MED ORDER — GLUCERNA SHAKE PO LIQD: 237.0000 mL | Freq: Two times a day (BID) | ORAL | Status: AC

## 2014-12-13 MED ORDER — PANTOPRAZOLE SODIUM 40 MG PO TBEC
40.0000 mg | DELAYED_RELEASE_TABLET | Freq: Every day | ORAL | Status: AC
Start: 2014-12-13 — End: ?

## 2014-12-13 MED ORDER — FUROSEMIDE 20 MG PO TABS: 40.0000 mg | ORAL_TABLET | Freq: Two times a day (BID) | ORAL | Status: AC

## 2014-12-13 MED ORDER — SODIUM CHLORIDE 0.9 % IV SOLN: 250.0000 mg | Freq: Three times a day (TID) | INTRAVENOUS | Status: AC

## 2014-12-13 NOTE — Progress Notes (Signed)
Pt for discharge to [facility].  Tarnov confirmed that pt can return with peripheral IV for three more doses of IV antibiotics.   CSW facilitated pt discharge needs including contacting facility, faxing pt discharge information via [facility], discussing with pt son, Herbie Baltimore via telephone, providing RN phone number to call report, and arranging ambulance transport for pt to [facility].  Pt son appreciative of update and plans to meet pt at [facility].  No further social work needs identified at this time.  CSW signing off.   Alison Murray, MSW, [place] [facility] [phone]

## 2014-12-13 NOTE — Discharge Summary (Signed)
Physician Discharge Summary  Kathryn [name] NMM:[id number] DOB: Apr 17, 1933 DOA: 12/04/2014  PCP: Blanchie Serve, MD  Admit date: 12/04/2014 Discharge date: 12/13/2014  Time spent: >30 minutes  Recommendations for Outpatient Follow-up:  Maintain adequate hydration Follow low sodium diet Medications as prescribed CMET in 5 days to follow electrolytes, LFT's and renal function 4 more doses of antibiotics at discharge  Physical therapy and rehabilitation as per facility protocol Please constant reposition and cream barriers as recommended therapy for pressure ulcer  Discharge Diagnoses:  Principal Problem:   Sepsis ([place]) Active Problems:   Macular degeneration (senile) of retina   Essential hypertension   Atrial fibrillation ([place])   Osteoporosis   GERD (gastroesophageal reflux disease)   DM (diabetes mellitus), type 2 with renal complications ([place])   Acute renal failure superimposed on stage 3 chronic kidney disease ([place])   Protein-calorie malnutrition ([place])   Pressure ulcer, stage 3 ([place])   Type 2 diabetes mellitus with diabetic polyneuropathy ([facility])   Constipation, chronic   Major depression, chronic ([facility])   Chronic diastolic CHF (congestive heart failure) ([facility])   UTI (lower urinary tract infection)   UGI bleed   Nausea & vomiting   Acute encephalopathy   Decubitus ulcer of ankle, stage 3 (H[facility])   GI hemorrhage   Abnormal LFTs   Choledocholithiasis   Bile duct stone   Discharge Condition: stable and improved. [facility] for [facility] care and treatment.  Diet recommendation: low carbohydrates and low sodium diet   Filed Weights   12/11/14 0726 12/12/14 0405 12/13/14 3159  Weight: 116.8 kg (257 lb 8 oz) 116.4 kg (256 lb 9.9 oz) 118.48 kg (261 lb 3.2 oz)    History of present illness:  81 y.o. female with PMH of bedbound, dwelling catheter, hypertension, diabetes mellitus, GERD, depression, atrial fibrillation not on anticoagulant (unclear  reason), recurrent UTI, diastolic congestive heart failure, CKD-III, who admitted because for nausea, vomiting, constipation, acute encephalopathy.  Patient reports that has been having nausea, vomiting for sever weeks, which has been worsening in past 2 days. She vomited more 10 times today without blood in the vomitus, but nurse noticed coffee-ground materials in ED. She does not have diarrhea or abdominal pain. No fever or chill. She is constipated in the past 4 days. She was taking Mirralax without any help. She had CT scan on 11/27/14 which showed mild intra and extrahepatic biliary dilatation which is likely due to the patient's age and prior cholecystectomy per etiologies, no obvious obstructing pancreatic head mass or common bile duct stone or ampullary lesion. She was found to have UTI and started with keflex. Her urine culture on 11/30/14 was positive for Escherichia coli, which are resistant to lactamase, Cipro, gentamicin and, Zosyn, and sensitive to imipenem, Bactrim, nitrofurantoin. She has dwelling catheter, and denies symptoms for UTI. Patient does not have chest pain, shortness breath, cough, rashes, unilateral weakness. Per her son, patient has been mildly confused in the past several days.   In ED, patient was found to have lactate 2.20, WBC 21.2, tachycardia, temperature normal, INR 1.21, PTT 20,, urinalysis pending (she had positive urinalysis on 11/29/14), worsening renal function, abnormal liver function with ALT 665, AST 53, ALT 48, total bilirubin 1.4, Guaiac positive in vomited gastric materials, chest x-ray no infiltration. CT-abd/pelvis w/o CM showed no acute abnormality, no hydronephrosis with status post prior cholecystectomy with postsurgical intra and extrahepatic biliary ductal dilatation.  Hospital Course:  Sepsis: Likely due UTI. Patient has been treated with Keflex  for positive urinalysis with Escherichia coli, which are resistant to lactamase urine culture 9-29.   -Continue with IV Primaxin -lactic acid WNL now -neg blood cx data  -advise to maintain adequate hydration   E. Coli UTI: -E coli from culture 9-24, multiresistant, sensitive to imipenem only.  -Need treatment for complicated UTI due to foley catheter for at least 5 days with of these with dedicated antibiotic therapy base on culture sensitivity  -continue imipenem and complete therapy -at discharge 4 more doses pending   Transaminases:  -GTT elevated, Alk phosphatase elevated  -RUQ [place]: No acute findings. No biliary dilatation status post cholecystectomy. -GI consulted. MRCP with filling defect. S/P ERCP, extraction with multiple stones.  -LFT trending down appropriately, will monitor as an outpatient  -diet advanced and well tolerated   Acute encephalopathy: Likely due to sepsis and UTI. -Treat UTI and sepsis as above -ammonia level at 11. Ammonia level done at her facility was in the 74 range.  -will discharge to SNF for further care and rehabilitation -patient is AAOX3 at discharge  AoCKD-III-IV: Baseline Cr is 1.4 , her Cr was 4.49 on admission. Hemodynamic related, hypotension, infection, sepsis, also continue use of ACE.  -lisinopril has been discontinued .  -cr trending down and at discharge 1.78 -foley catheter replace 9-30, good urine output.  -appreciate nephrologist assistance and rec's.  -will discharge on lasix BID -close outpatient follow up of renal function and electrolytes   Diarrhea; Resolved, in setting of laxatives. C diff antigen positive. Per GI no need to treat for C diff infection; as appears to be just colonization and patient is not having diarrhea.   Possible UGI: Nurse noticed coffee-ground material from her vomiting. Differential : acute gastritis or stress ulcers. Patient does not have history of cirrhosis. Her hemoglobin is 12.6 on 11/29/14-->12.0.  -continue Protonix, but will switch to PO -Avoid NSAIDs and SQ heparin -Hb stable.   -GI consulted. No need for endoscopy at this point.  -no further episodes of overt bleeding appreciated  Atrial Fibrillation: CHA2DS2-VASc Score is 5-6, needs oral anticoagulation. Patient is not on Anticoagulation at home, now presents with possible GIB, which prevent initiation of anticoagulants.  -continue metoprolol for rate control   DM-II: Last A1c 5.8 on 06/04/14.  -continue SSI  HTN: -Hold amlodipine and lisinopril since patient blood pressure has been soft due to sepsis and ongoing diuresis -will monitor and adjust antihypertensive agents as needed -continue low dose metoprolol and lasix   Protein-calorie malnutrition: -continue Ensure TID  Chronic diastolic CHF (congestive heart failure): compensated.  -BNP; 167 -on metoprolol low dose and lasix BID.  -follow strict intake and output -no SOB and no crackles on exam -patient with fluid overload, but attributed to 3rd space shifting   Decubitus ulcer-stage 3: -Wound care consulted, will follow rec's for local care -preventive measures  Procedures:  See below for x-ray reports   Consultations:  GI  Renal service   Discharge Exam: Filed Vitals:   12/13/14 0607  BP: 94/60  Pulse: 77  Temp: 97.4 F (36.3 C)  Resp: 14    General: Alert in no distress, afebrile and feeling much better. Denies CP and SOB.  Cardiovascular: irregular rhythm, rate controlled, no rubs or gallops  Respiratory: CTA bilaterally  Abdomen: BS present, mildly distended.   Musculoskeletal: edema 4 extremities (1-2++)  Skin: decubitus ulcer stage 3, no signs of superimposed cellulitic process appreciated  Discharge Instructions   Discharge Instructions    Diet - low sodium heart  healthy    Complete by:  As directed      Discharge instructions    Complete by:  As directed   Maintain adequate hydration Follow low sodium diet Medications as prescribed CMET in 5 days to follow electrolytes, LFT's and renal function 4  more doses of antibiotics at discharge  Physical therapy and rehabilitation as per facility protocol Please constant reposition and cream barriers as recommended therapy for pressure ulcer          Current Discharge Medication List    START taking these medications   Details  feeding supplement, GLUCERNA SHAKE, (GLUCERNA SHAKE) LIQD Take 237 mLs by mouth 2 (two) times daily between meals.    furosemide (LASIX) 20 MG tablet Take 2 tablets (40 mg total) by mouth 2 (two) times daily.    imipenem-cilastatin 250 mg in sodium chloride 0.9 % 100 mL Inject 250 mg into the vein every 8 (eight) hours. At discharge 4 doses pending.    pantoprazole (PROTONIX) 40 MG tablet Take 1 tablet (40 mg total) by mouth daily at 12 noon.    potassium chloride SA (K-DUR,KLOR-CON) 20 MEQ tablet Take 2 tablets (40 mEq total) by mouth daily.    saccharomyces boulardii (FLORASTOR) 250 MG capsule Take 1 capsule (250 mg total) by mouth 2 (two) times daily.      CONTINUE these medications which have CHANGED   Details  metoprolol tartrate (LOPRESSOR) 25 MG tablet Take 1 tablet (25 mg total) by mouth 2 (two) times daily.    polyethylene glycol (MIRALAX / GLYCOLAX) packet Take 17 g by mouth daily as needed for moderate constipation. For constipation   Associated Diagnoses: Anemia      CONTINUE these medications which have NOT CHANGED   Details  amLODipine (NORVASC) 10 MG tablet Take 10 mg by mouth daily.    beta carotene w/minerals (OCUVITE) tablet Take 1 tablet by mouth 2 (two) times daily.     Calcium Carbonate-Vitamin D 600-400 MG-UNIT per tablet Take 1 tablet by mouth 2 (two) times daily.     Cholecalciferol (VITAMIN D3) 2000 UNITS TABS Take 1 tablet by mouth daily.     gabapentin (NEURONTIN) 300 MG capsule Take 300 mg by mouth at bedtime.     insulin lispro (HUMALOG) 100 UNIT/ML cartridge Inject into the skin. Check blood sugar three times daily with meals if CBG> 200 inject 5 units subcutaneously      Multiple Vitamins-Minerals (DECUBI-VITE) CAPS Take 1 capsule by mouth daily.     ondansetron (ZOFRAN) 4 MG tablet Take 4 mg by mouth every 8 (eight) hours as needed for nausea or vomiting.    Protein (PROCEL) POWD Take by mouth. 1 scoop in 6-8 oz of liquid and take by mouth twice daily for nutritional support and increase protein    AMBULATORY NON FORMULARY MEDICATION Medication Name: Med Pass 240 mL with water three times daily for hydration    benzonatate (TESSALON) 100 MG capsule Take 100 mg by mouth 3 (three) times daily as needed for cough (x 7 days starting 08/20/14).    hydrOXYzine (ATARAX/VISTARIL) 25 MG tablet Take 25 mg by mouth every 8 (eight) hours as needed for itching.    ipratropium-albuterol (DUONEB) 0.5-2.5 (3) MG/3ML SOLN Take 3 mLs by nebulization. Administer 3 ml via nebulizer four times a day as needed for cough and wheezing    simethicone (MYLICON) 80 MG chewable tablet Chew 160 mg by mouth 3 (three) times daily. For gas pains.  STOP taking these medications     cephALEXin (KEFLEX) 500 MG capsule      lisinopril (PRINIVIL,ZESTRIL) 40 MG tablet        Allergies  Allergen Reactions  . Celebrex [Celecoxib]   . Nsaids   . Sulfa Antibiotics    Follow-up Information    Follow up with Blanchie Serve, MD. Schedule an appointment as soon as possible for a visit in 2 weeks.   Specialty:  Internal Medicine   Contact information:   [place] [address] [phone]       The results of significant diagnostics from this hospitalization (including imaging, microbiology, ancillary and laboratory) are listed below for reference.    Significant Diagnostic Studies: Ct Abdomen Pelvis Wo Contrast  12/04/2014   CLINICAL DATA:  Nausea vomiting for 2 days.  EXAM: CT ABDOMEN AND PELVIS WITHOUT CONTRAST  TECHNIQUE: Multidetector CT imaging of the abdomen and pelvis was performed following the standard protocol without IV contrast.  COMPARISON:   November 27, 2014  FINDINGS: Patient status post prior cholecystectomy with intra and extrahepatic biliary ductal dilatation. The liver is otherwise normal. The spleen, pancreas are normal. There is hypertrophy of bilateral adrenal glands unchanged compared to prior exam. There is no nephrolithiasis or hydroureteronephrosis bilaterally. There is mild scarring of both kidneys. There is atherosclerosis of the abdominal aorta without aneurysmal dilatation. Small lymph nodes are identified in the abdomen unchanged compared to prior exam.  There is no small bowel obstruction or diverticulitis. Patient is status post prior appendectomy. Nasogastric tube is identified with distal tip in the stomach.  The bladder is decompressed limiting evaluation. The uterus is normal. No focal pneumonia or pleural effusion is identified in the visualized lung bases. Degenerative joint changes of the spine are identified.  IMPRESSION: No acute abnormality identified in the abdomen and pelvis.  Status post prior cholecystectomy with postsurgical intra and extrahepatic biliary ductal dilatation.   Electronically Signed   By: Abelardo Diesel M.D.   On: 12/04/2014 22:24   Dg Chest 2 View  11/29/2014   CLINICAL DATA:  Altered mental status and elevated ammonia and LFTs  EXAM: CHEST - 2 VIEW  COMPARISON:  05/16/2011  FINDINGS: Cardiac shadow is stable. The lungs are well aerated bilaterally. Elevation of the right hemidiaphragm is seen. No bony abnormality is noted.  IMPRESSION: No acute abnormality noted.   Electronically Signed   By: Inez Catalina M.D.   On: 11/29/2014 20:13   Ct Abdomen W Contrast  11/27/2014   CLINICAL DATA:  Elevated liver function studies.  EXAM: CT ABDOMEN WITH CONTRAST  TECHNIQUE: Multidetector CT imaging of the abdomen was performed using the standard protocol following bolus administration of intravenous contrast.  CONTRAST:  4mL OMNIPAQUE IOHEXOL 300 MG/ML  SOLN  COMPARISON:  CT scan 04/25/2011  FINDINGS: Lower  chest: The lung bases are clear of acute process. There are basilar scarring changes and dependent atelectasis. The heart is normal in size. No pericardial effusion. Three-vessel coronary artery calcifications are noted. The distal esophagus is grossly normal. There is a small hiatal hernia. Dense bilateral breast calcifications are noted.  Hepatobiliary: No focal hepatic lesions are identified. There is mild intra and extrahepatic biliary dilatation likely due to the patient's age and prior cholecystectomy.  Pancreas: Mild atrophy but no mass, inflammation or ductal dilatation.  Spleen: Normal size.  No focal lesions.  Adrenals/Urinary Tract: Stable bilateral adrenal gland nodules consistent with benign adenomas.  The kidneys demonstrate renal cortical thinning in  scarring but no mass or hydronephrosis.  Stomach/Bowel: The stomach, duodenum, visualized small bowel and visualize colon are unremarkable. No inflammatory changes, mass lesions or obstructive findings.  Vascular/Lymphatic: No mesenteric or retroperitoneal mass or adenopathy. Small scattered lymph nodes are noted.  Other: No abdominal wall hernia, subcutaneous lesions or ascites.  Musculoskeletal: No significant bony findings. Osteoporosis and degenerative changes are noted.  IMPRESSION: 1. Mild intra and extrahepatic biliary dilatation likely due to the patient's age and prior cholecystectomy. No obvious obstructing pancreatic head mass, common bile duct stone or ampullary lesion. This appears stable when compared to prior CTs from 2013. 2. Renal scarring changes.  Stable renal artery calcifications. 3. Advanced atherosclerotic calcifications involving the coronary arteries, aorta and branch vessels.   Electronically Signed   By: Marijo Sanes M.D.   On: 11/27/2014 14:56   US Abdomen Complete  12/05/2014   CLINICAL DATA:  Elevated transaminase levels. Acute renal failure. Previous cholecystectomy. Initial encounter.  EXAM: ULTRASOUND ABDOMEN COMPLETE   COMPARISON:  CT 12/04/2014.  FINDINGS: Gallbladder: Status post cholecystectomy  Common bile duct: Diameter: 3 mm.  Liver: The liver is small with contour irregularity and diffusely heterogeneous echotexture. No focal lesion observed.  IVC: No abnormality visualized.  Pancreas: Fatty replaced and atrophy.  No focal abnormality.  Spleen: Largely obscured by bowel gas and poorly visualized.  Right Kidney: Length: 9.7 cm. There is cortical thinning and lobularity, but no hydronephrosis or suspicious lesion.  Left Kidney: Length: 11.5 cm. Mild cortical thinning. No hydronephrosis.  Abdominal aorta: Suboptimally visualized. Atherosclerosis without evidence of aneurysm.  Other findings: None.  IMPRESSION: 1. No acute findings. No biliary dilatation status post cholecystectomy. 2. Examination is limited by body habitus and bowel gas. The parenchymal organs were better visualized on CT done yesterday.   Electronically Signed   By: Richardean Sale M.D.   On: 12/05/2014 20:54   Mr Abdomen Mrcp Wo Cm  12/06/2014   CLINICAL DATA:  Biliary dilatation status post cholecystectomy. Possible biliary sepsis. Acute encephalopathy and renal insufficiency. Initial encounter.  EXAM: MRI ABDOMEN WITHOUT CONTRAST  (INCLUDING MRCP)  TECHNIQUE: Multiplanar multisequence MR imaging of the abdomen was performed. Heavily T2-weighted images of the biliary and pancreatic ducts were obtained, and three-dimensional MRCP images were rendered by post processing.  COMPARISON:  CTs 04/25/2011, 11/27/2014 and 12/04/2014.  FINDINGS: Study is moderately motion degraded due to the patient's inability to suspend respiration.  Lower chest: There are small bilateral pleural effusions and associated bibasilar atelectasis which appear mildly worse.  Hepatobiliary: No focal hepatic abnormalities are identified. There is mild intra and extrahepatic biliary dilatation. The common hepatic duct measures up to 13 mm in diameter. There are small dependent filling  defects within the distal common bile duct which appear somewhat linear on the reformatted images, favored to reflect several small intraductal stones, best seen on series 5.  Pancreas: Atrophy. No evidence of pancreatic mass, pancreatic ductal dilatation or surrounding inflammation.  Spleen: Normal in size without focal abnormality.  Adrenals/Urinary Tract: Stable mild left adrenal hyperplasia. Both kidneys demonstrate cortical thinning, but no hydronephrosis or suspicious focal abnormality.  Stomach/Bowel: Mild distension of the colon with fluid and air appears unchanged. No bowel wall thickening or focal inflammatory change identified.  Vascular/Lymphatic: There are no enlarged abdominal lymph nodes. Diffuse atherosclerosis better demonstrated by CT.  Other: There is diffuse muscular atrophy. There is mild generalized soft tissue edema with asymmetric flank edema on the right. Calcified breast lesion appears unchanged.  Musculoskeletal: No acute or  significant osseous findings.  IMPRESSION: 1. Mild biliary dilatation status post cholecystectomy. Small filling defects within the distal common bile duct are suspicious for choledocholithiasis. 2. No evidence of pancreatic mass or pancreatic ductal dilatation. 3. Enlarging small bilateral pleural effusions with diffuse soft tissue edema suspicious for anasarca. 4. Stable renal cortical thinning and left adrenal hyperplasia.   Electronically Signed   By: Richardean Sale M.D.   On: 12/06/2014 10:42   Mr 3d Recon At Scanner  12/06/2014   CLINICAL DATA:  Biliary dilatation status post cholecystectomy. Possible biliary sepsis. Acute encephalopathy and renal insufficiency. Initial encounter.  EXAM: MRI ABDOMEN WITHOUT CONTRAST  (INCLUDING MRCP)  TECHNIQUE: Multiplanar multisequence MR imaging of the abdomen was performed. Heavily T2-weighted images of the biliary and pancreatic ducts were obtained, and three-dimensional MRCP images were rendered by post processing.   COMPARISON:  CTs 04/25/2011, 11/27/2014 and 12/04/2014.  FINDINGS: Study is moderately motion degraded due to the patient's inability to suspend respiration.  Lower chest: There are small bilateral pleural effusions and associated bibasilar atelectasis which appear mildly worse.  Hepatobiliary: No focal hepatic abnormalities are identified. There is mild intra and extrahepatic biliary dilatation. The common hepatic duct measures up to 13 mm in diameter. There are small dependent filling defects within the distal common bile duct which appear somewhat linear on the reformatted images, favored to reflect several small intraductal stones, best seen on series 5.  Pancreas: Atrophy. No evidence of pancreatic mass, pancreatic ductal dilatation or surrounding inflammation.  Spleen: Normal in size without focal abnormality.  Adrenals/Urinary Tract: Stable mild left adrenal hyperplasia. Both kidneys demonstrate cortical thinning, but no hydronephrosis or suspicious focal abnormality.  Stomach/Bowel: Mild distension of the colon with fluid and air appears unchanged. No bowel wall thickening or focal inflammatory change identified.  Vascular/Lymphatic: There are no enlarged abdominal lymph nodes. Diffuse atherosclerosis better demonstrated by CT.  Other: There is diffuse muscular atrophy. There is mild generalized soft tissue edema with asymmetric flank edema on the right. Calcified breast lesion appears unchanged.  Musculoskeletal: No acute or significant osseous findings.  IMPRESSION: 1. Mild biliary dilatation status post cholecystectomy. Small filling defects within the distal common bile duct are suspicious for choledocholithiasis. 2. No evidence of pancreatic mass or pancreatic ductal dilatation. 3. Enlarging small bilateral pleural effusions with diffuse soft tissue edema suspicious for anasarca. 4. Stable renal cortical thinning and left adrenal hyperplasia.   Electronically Signed   By: Richardean Sale M.D.   On:  12/06/2014 10:42   Dg Chest Port 1 View  12/04/2014   CLINICAL DATA:  Hypoxia. Nausea and vomiting. Increased somnolence for 2 days.  EXAM: PORTABLE CHEST 1 VIEW  COMPARISON:  Frontal and lateral views 11/29/2014  FINDINGS: Lower lung volumes compared to prior exam accentuating bronchovascular structures and cardiac size. Again seen elevation of right hemidiaphragm with adjacent compressive atelectasis. No confluent airspace disease, large pleural effusion, pneumothorax or pulmonary edema. The osseous structures are unchanged.  IMPRESSION: Lower lung volumes with right basilar atelectasis.   Electronically Signed   By: Jeb Levering M.D.   On: 12/04/2014 18:51   Dg Ercp Biliary & Pancreatic Ducts  12/07/2014   CLINICAL DATA:  Choledocholithiasis.  EXAM: ERCP with sphincterotomy  TECHNIQUE: Multiple spot images obtained with the fluoroscopic device and submitted for interpretation post-procedure.  FLUOROSCOPY TIME:  Fluoroscopy Time:  2 minutes 6 seconds  Number of Acquired Images:  14  COMPARISON:  MRI abdomen/MRCP from 12/06/2014.  FINDINGS: Initial contrast-enhanced images of the common  bile duct demonstrate multiple small ovoid filling defects in the mid to distal common bile duct with dilated proximal common bile duct. Multiple balloon sweeps were performed of the common bile duct. The final provided post balloon sweep images of the common bile duct demonstrate no definite residual filling defects in the common bile duct.  IMPRESSION: Multiple small ovoid filling defects in the dilated mid to distal common bile duct, in keeping with choledocholithiasis. No residual filling defects in the common bile duct status post balloon sweep.  These images were submitted for radiologic interpretation only. Please see the procedural report for the amount of contrast and the fluoroscopy time utilized.   Electronically Signed   By: Ilona Sorrel M.D.   On: 12/07/2014 19:53    Microbiology: Recent Results (from the  past 240 hour(s))  Blood culture (routine x 2)     Status: None   Collection Time: 12/04/14  6:00 PM  Result Value Ref Range Status   Specimen Description BLOOD LEFT HAND  Final   Special Requests BOTTLES DRAWN AEROBIC AND ANAEROBIC 5CC EACH  Final   Culture   Final    NO GROWTH 5 DAYS Performed at [facility]    Report Status 12/09/2014 FINAL  Final  Blood culture (routine x 2)     Status: None   Collection Time: 12/04/14  6:00 PM  Result Value Ref Range Status   Specimen Description BLOOD RIGHT HAND  Final   Special Requests BOTTLES DRAWN AEROBIC AND ANAEROBIC 5CC EACH  Final   Culture   Final    NO GROWTH 5 DAYS Performed at [facility]    Report Status 12/09/2014 FINAL  Final  MRSA PCR Screening     Status: Abnormal   Collection Time: 12/04/14 11:16 PM  Result Value Ref Range Status   MRSA by PCR POSITIVE (A) NEGATIVE Final    Comment:        The GeneXpert MRSA Assay (FDA approved for NASAL specimens only), is one component of a comprehensive MRSA colonization surveillance program. It is not intended to diagnose MRSA infection nor to guide or monitor treatment for MRSA infections. RESULT CALLED TO, READ BACK BY AND VERIFIED WITH: NI,A/2W@0315  ON 12/05/14 BY KARCZEWSKI,S.   Urine culture     Status: None   Collection Time: 12/06/14 11:48 PM  Result Value Ref Range Status   Specimen Description URINE, CATHETERIZED  Final   Special Requests NONE  Final   Culture   Final    NO GROWTH 1 DAY Performed at [facility]    Report Status 12/08/2014 FINAL  Final  C difficile quick scan w PCR reflex     Status: Abnormal   Collection Time: 12/07/14  6:00 PM  Result Value Ref Range Status   C Diff antigen POSITIVE (A) NEGATIVE Final   C Diff toxin NEGATIVE NEGATIVE Final   C Diff interpretation   Final    C. difficile present, but toxin not detected. This indicates colonization. In most cases, this does not require treatment. If patient has signs and  symptoms consistent with colitis, consider treatment. Requires ENTERIC precautions.     Labs: Basic Metabolic Panel:  Recent Labs Lab 12/08/14 0643 12/09/14 0345 12/10/14 0410 12/11/14 0416 12/13/14 0456  NA 133* 133* 138 137 136  K 3.3* 3.7 3.5 3.1* 3.3*  CL 104 104 105 102 101  CO2 23 20* 23 23 25   GLUCOSE 271* 168* 182* 180* 143*  BUN 59* 56* 60*  55* 42*  CREATININE 2.94* 2.68* 2.47* 2.23* 1.78*  CALCIUM 7.4* 7.6* 7.8* 7.8* 7.6*   Liver Function Tests:  Recent Labs Lab 12/07/14 0405 12/08/14 0643 12/09/14 0345  AST $Re'24 24 25  'gGs$ ALT 27 18 13*  ALKPHOS 406* 283* 279*  BILITOT 1.3* 1.0 0.8  PROT 5.8* 4.7* 5.1*  ALBUMIN 2.6* 2.0* 2.5*   CBC:  Recent Labs Lab 12/08/14 0643 12/09/14 0345 12/10/14 0410 12/11/14 0416 12/13/14 0456  WBC 9.8 8.6 7.6 6.0 6.7  HGB 9.3* 9.6* 10.3* 10.2* 10.5*  HCT 28.1* 29.3* 31.2* 30.7* 30.9*  MCV 101.8* 102.1* 102.0* 100.7* 99.4  PLT 205 176 187 187 177   BNP (last 3 results)  Recent Labs  12/05/14 0325  BNP 167.5*   CBG:  Recent Labs Lab 12/12/14 0726 12/12/14 1227 12/12/14 1705 12/12/14 2143 12/13/14 0749  GLUCAP 164* 154* 155* 113* 141*    Signed:  Barton Dubois  [facility] 12/13/2014, 9:15 AM

## 2014-12-13 NOTE — Progress Notes (Signed)
Patient refused most of her hs meds wanted to "sleep due to fatigue" but did convince her to take lopressor and lasix crushed with ice cream.

## 2014-12-13 NOTE — Plan of Care (Signed)
Problem: Phase II Progression Outcomes Goal: Obtain order to discontinue catheter if appropriate Outcome: Not Applicable Date Met:  12/78/71 Foley in place for I&O monitoring and as well as wound healing

## 2014-12-13 NOTE — Progress Notes (Signed)
Report phoned to [place], Therapist, sports at [facility].  Transport arranged for 1:30 PM today.

## 2014-12-20 ENCOUNTER — Non-Acute Institutional Stay (SKILLED_NURSING_FACILITY): Payer: Medicare Other | Admitting: Internal Medicine

## 2014-12-20 DIAGNOSIS — K8050 Calculus of bile duct without cholangitis or cholecystitis without obstruction: Secondary | ICD-10-CM | POA: Diagnosis not present

## 2014-12-20 DIAGNOSIS — I10 Essential (primary) hypertension: Secondary | ICD-10-CM

## 2014-12-20 DIAGNOSIS — N390 Urinary tract infection, site not specified: Secondary | ICD-10-CM | POA: Diagnosis not present

## 2014-12-20 DIAGNOSIS — B9620 Unspecified Escherichia coli [E. coli] as the cause of diseases classified elsewhere: Secondary | ICD-10-CM

## 2014-12-20 DIAGNOSIS — R5381 Other malaise: Secondary | ICD-10-CM | POA: Diagnosis not present

## 2014-12-20 DIAGNOSIS — E46 Unspecified protein-calorie malnutrition: Secondary | ICD-10-CM | POA: Diagnosis not present

## 2014-12-20 DIAGNOSIS — K219 Gastro-esophageal reflux disease without esophagitis: Secondary | ICD-10-CM

## 2014-12-20 DIAGNOSIS — E1142 Type 2 diabetes mellitus with diabetic polyneuropathy: Secondary | ICD-10-CM | POA: Diagnosis not present

## 2014-12-20 DIAGNOSIS — I4821 Permanent atrial fibrillation: Secondary | ICD-10-CM

## 2014-12-20 DIAGNOSIS — I482 Chronic atrial fibrillation: Secondary | ICD-10-CM | POA: Diagnosis not present

## 2014-12-20 DIAGNOSIS — E876 Hypokalemia: Secondary | ICD-10-CM

## 2014-12-20 DIAGNOSIS — N183 Chronic kidney disease, stage 3 (moderate): Secondary | ICD-10-CM | POA: Diagnosis not present

## 2014-12-20 DIAGNOSIS — L8993 Pressure ulcer of unspecified site, stage 3: Secondary | ICD-10-CM

## 2014-12-20 DIAGNOSIS — I5032 Chronic diastolic (congestive) heart failure: Secondary | ICD-10-CM

## 2014-12-20 NOTE — Progress Notes (Signed)
Patient ID: Kathryn [name], female   DOB: 10-25-1933, 82 y.o.   MRN: [id number]     [facility]   PCP: Blanchie Serve, MD  Code Status: DNR  Allergies  Allergen Reactions  . Celebrex [Celecoxib]   . Nsaids   . Sulfa Antibiotics     Chief Complaint  Patient presents with  . Readmit To SNF     HPI:  81 y.o. patient is here for long term care post hospital admission from 12/04/14-12/13/14 with sepsis and acute encephalopathy. She was treated for E.coli UTI. With her deranged transaminases, she was seen by GI. MRCP showed filling defect and she underwent ERCP ans multiple stones were extracted. She also had acute on chronic renal failure and was seen by nephrology team. Losartan was discontinued. There were concerns for upper gi bleed as well but gi recommended no further workup. She has PMH of HTN, DM, afib, CHF, ckd stage 3. She is seen in her room today. She is alert and oriented to person and place today. She feels weak and tired. Denies any concerns. she is under total care.    Review of Systems:  Constitutional: Negative for fever, chills, diaphoresis.  HENT: Negative for headache, congestion, nasal discharge Eyes: Negative for eye pain, blurred vision, double vision and discharge.  Respiratory: Negative for cough, shortness of breath and wheezing.   Cardiovascular: Negative for chest pain, palpitations. Gastrointestinal: Negative for heartburn, nausea, vomiting, abdominal pain. Genitourinary: Negative for dysuria Musculoskeletal: chronic back pain, no falls in the facility Skin: Negative for itching, rash.  Neurological: Negative for dizziness, tingling, focal weakness Psychiatric/Behavioral: Negative for depression   Past Medical History  Diagnosis Date  . Osteoporosis   . HTN (hypertension)     unspecified  . Atrial fibrillation ([place])   . Macular degeneration   . Osteoarthritis   . Edema   . Anemia   . Hydronephrosis     left    . UTI (lower urinary tract infection)   . Pressure ulcer of back   . DM (diabetes mellitus) ([place])     lantus  . Depression   . Insomnia   . GERD (gastroesophageal reflux disease)   . CKD (chronic kidney disease), stage III   . Chronic diastolic CHF (congestive heart failure) [facility])    Past Surgical History  Procedure Laterality Date  . Replacement total knee  08/2007    bilateral  . Appendectomy    . Cholecystectomy    . Colonoscopy    . Upper gastrointestinal endoscopy    . Cystoscopy w/ ureteral stent placement  04/29/2011    Procedure: CYSTOSCOPY WITH RETROGRADE PYELOGRAM/URETERAL STENT PLACEMENT;  Surgeon: Bernestine Amass, MD;  Location: [facility];  Service: Urology;  Laterality: Bilateral;  bilateral retrograde, double j stent. Cystogram   . Ureteroscopy  04/29/2011    Procedure: URETEROSCOPY;  Surgeon: Bernestine Amass, MD;  Location: [facility];  Service: Urology;  Laterality: Right;  . Cystoscopy with ureteroscopy  05/31/2011    Procedure: CYSTOSCOPY WITH URETEROSCOPY;  Surgeon: Bernestine Amass, MD;  Location: [facility];  Service: Urology;  Laterality: Right;  Ureteral biopsy Right    . Ercp N/A 12/07/2014    Procedure: ENDOSCOPIC RETROGRADE CHOLANGIOPANCREATOGRAPHY (ERCP);  Surgeon: Inda Castle, MD;  Location: [facility];  Service: Endoscopy;  Laterality: N/A;   Social History:   reports that she has never smoked. She has never used smokeless tobacco. She reports that she does not drink alcohol or  use illicit drugs.  Family History  Problem Relation Age of Onset  . Diabetes Father   . Atrial fibrillation Father   . Breast cancer Paternal Grandmother   . Heart disease Mother     Medications:   Medication List       This list is accurate as of: 12/20/14 11:59 PM.  Always use your most recent med list.               AMBULATORY NON FORMULARY MEDICATION  Medication Name: Med Pass 240 mL with water three times daily for hydration     amLODipine 10 MG tablet  Commonly known  as:  NORVASC  Take 10 mg by mouth daily.     benzonatate 100 MG capsule  Commonly known as:  TESSALON  Take 100 mg by mouth 3 (three) times daily as needed for cough (x 7 days starting 08/20/14).     Calcium Carbonate-Vitamin D 600-400 MG-UNIT tablet  Take 1 tablet by mouth 2 (two) times daily.     DECUBI-VITE Caps  Take 1 capsule by mouth daily.     beta carotene w/minerals tablet  Take 1 tablet by mouth 2 (two) times daily.     feeding supplement (GLUCERNA SHAKE) Liqd  Take 237 mLs by mouth 2 (two) times daily between meals.     furosemide 20 MG tablet  Commonly known as:  LASIX  Take 2 tablets (40 mg total) by mouth 2 (two) times daily.     gabapentin 300 MG capsule  Commonly known as:  NEURONTIN  Take 300 mg by mouth at bedtime.     hydrOXYzine 25 MG tablet  Commonly known as:  ATARAX/VISTARIL  Take 25 mg by mouth every 8 (eight) hours as needed for itching.     insulin lispro 100 UNIT/ML cartridge  Commonly known as:  HUMALOG  Inject 5 Units into the skin 3 (three) times daily as needed (blood sugar). Check blood sugar three times daily with meals if CBG> 200 inject 5 units subcutaneously     ipratropium-albuterol 0.5-2.5 (3) MG/3ML Soln  Commonly known as:  DUONEB  Take 3 mLs by nebulization. Administer 3 ml via nebulizer four times a day as needed for cough and wheezing     metoprolol tartrate 25 MG tablet  Commonly known as:  LOPRESSOR  Take 1 tablet (25 mg total) by mouth 2 (two) times daily.     ondansetron 4 MG tablet  Commonly known as:  ZOFRAN  Take 4 mg by mouth every 8 (eight) hours as needed for nausea or vomiting.     pantoprazole 40 MG tablet  Commonly known as:  PROTONIX  Take 1 tablet (40 mg total) by mouth daily at 12 noon.     polyethylene glycol packet  Commonly known as:  MIRALAX / GLYCOLAX  Take 17 g by mouth daily as needed for moderate constipation. For constipation     potassium chloride SA 20 MEQ tablet  Commonly known as:   K-DUR,KLOR-CON  Take 2 tablets (40 mEq total) by mouth daily.     PROCEL Powd  Take 1 scoop by mouth 2 (two) times daily.     saccharomyces boulardii 250 MG capsule  Commonly known as:  FLORASTOR  Take 1 capsule (250 mg total) by mouth 2 (two) times daily.     simethicone 80 MG chewable tablet  Commonly known as:  MYLICON  Chew 509 mg by mouth 3 (three) times daily. For gas pains.  Vitamin D3 2000 UNITS Tabs  Take 1 tablet by mouth daily.         Physical Exam: Filed Vitals:   12/20/14 1002  BP: 110/69  Pulse: 74  Temp: 99 F (37.2 C)  Resp: 18  Weight: 236 lb 9.6 oz (107.321 kg)  SpO2: 99%    General- elderly female, obese, in no acute distress Head- normocephalic, atraumatic Nose- normal nasal mucosa, no maxillary or frontal sinus tenderness, no nasal discharge Throat- moist mucus membrane Eyes- PERRLA, EOMI, no pallor, no icterus, no discharge, normal conjunctiva, normal sclera Neck- no cervical lymphadenopathy, no JVD   Cardiac: Normal S1, S2. RRR. Distal pulses diminished. 1+ dependent edema.   Lungs: No respiratory distress. Breath sounds clear bilaterally without rales, rhonchi, or wheezes. Abdomen: Audible bowel sounds in all quadrants. Soft, nontender, nondistended but obese. No palpable mass.   Musculoskeletal: generalized weakness, strength 4/5 in UE and 3/5 in LE Skin: Warm and dry. No petechiae or purpura. Sacral stage 3 pressure ulcer, left 5th toe area SDTI, left hand and buttock has erythematous rash Psychiatry- normal mood and affect    Labs reviewed: see hospital records  Radiological Exams: Ct Abdomen Pelvis Wo Contrast  12/04/2014  CLINICAL DATA:  Nausea vomiting for 2 days. EXAM: CT ABDOMEN AND PELVIS WITHOUT CONTRAST TECHNIQUE: Multidetector CT imaging of the abdomen and pelvis was performed following the standard protocol without IV contrast. COMPARISON:  November 27, 2014 FINDINGS: Patient status post prior cholecystectomy with intra and  extrahepatic biliary ductal dilatation. The liver is otherwise normal. The spleen, pancreas are normal. There is hypertrophy of bilateral adrenal glands unchanged compared to prior exam. There is no nephrolithiasis or hydroureteronephrosis bilaterally. There is mild scarring of both kidneys. There is atherosclerosis of the abdominal aorta without aneurysmal dilatation. Small lymph nodes are identified in the abdomen unchanged compared to prior exam. There is no small bowel obstruction or diverticulitis. Patient is status post prior appendectomy. Nasogastric tube is identified with distal tip in the stomach. The bladder is decompressed limiting evaluation. The uterus is normal. No focal pneumonia or pleural effusion is identified in the visualized lung bases. Degenerative joint changes of the spine are identified. IMPRESSION: No acute abnormality identified in the abdomen and pelvis. Status post prior cholecystectomy with postsurgical intra and extrahepatic biliary ductal dilatation. Electronically Signed   By: Abelardo Diesel M.D.   On: 12/04/2014 22:24   US Abdomen Complete  12/05/2014  CLINICAL DATA:  Elevated transaminase levels. Acute renal failure. Previous cholecystectomy. Initial encounter. EXAM: ULTRASOUND ABDOMEN COMPLETE COMPARISON:  CT 12/04/2014. FINDINGS: Gallbladder: Status post cholecystectomy Common bile duct: Diameter: 3 mm. Liver: The liver is small with contour irregularity and diffusely heterogeneous echotexture. No focal lesion observed. IVC: No abnormality visualized. Pancreas: Fatty replaced and atrophy.  No focal abnormality. Spleen: Largely obscured by bowel gas and poorly visualized. Right Kidney: Length: 9.7 cm. There is cortical thinning and lobularity, but no hydronephrosis or suspicious lesion. Left Kidney: Length: 11.5 cm. Mild cortical thinning. No hydronephrosis. Abdominal aorta: Suboptimally visualized. Atherosclerosis without evidence of aneurysm. Other findings: None. IMPRESSION:  1. No acute findings. No biliary dilatation status post cholecystectomy. 2. Examination is limited by body habitus and bowel gas. The parenchymal organs were better visualized on CT done yesterday. Electronically Signed   By: Richardean Sale M.D.   On: 12/05/2014 20:54   Dg Chest Port 1 View  12/04/2014  CLINICAL DATA:  Hypoxia. Nausea and vomiting. Increased somnolence for 2 days. EXAM: PORTABLE CHEST 1  VIEW COMPARISON:  Frontal and lateral views 11/29/2014 FINDINGS: Lower lung volumes compared to prior exam accentuating bronchovascular structures and cardiac size. Again seen elevation of right hemidiaphragm with adjacent compressive atelectasis. No confluent airspace disease, large pleural effusion, pneumothorax or pulmonary edema. The osseous structures are unchanged. IMPRESSION: Lower lung volumes with right basilar atelectasis. Electronically Signed   By: Jeb Levering M.D.   On: 12/04/2014 18:51     Assessment/Plan  Physical deconditioning Will have patient work with PT/OT as tolerated to regain strength and restore function.  Fall precautions are in place. Poor overall prognosis given her recent infection, pressure ulcer and protein calorie malnutrition  E.coli uti Has completed course of imipenem, monitor clinically, hydration to be maintained, continue foley care  Choledocholithiasis S/p MRCP showing filling defects and ERCP on 10/1 with sphincterotomy and removal of stones. LFTs improving. Monitor LFT  Stage 3 pressure ulcer To the sacrum. Pressure ulcer prophylaxis. To use air mattress. Continue wound care. Continue decubivite to assist with wound healing  Protein calorie malnutrition Monitor po intake. Continue pressure ulcer prophylaxis. Continue feeding supplement, to be followed by dietary  ckd  Off lisinopril or now.   afib Rate controlled. Continue metoprolol 25 mg bid, off anticoagulation  DM with neuropathy Monitor cbg, continue humalog 5 u tid with meals and  neurontin 300 mg qhs  HTN Stable bp, continue lasix 40 mg bid, metoprolol 25 mg bid, amlodipine 10 mg daily, monitor bp  CHF Continue metorpolol 25 mg bid and lasix 40 mg bid, monitor bmp, continue kcl supplement 40 meq daily  Hypokalemia Continue kcl and check bmp  gerd Continue protonix 40 mg daily  Goals of care: LONG TERM CARE   Labs/tests ordered: cbc, cmp, ammonia, cxr  Family/ staff Communication: reviewed care plan with patient and nursing supervisor    Blanchie Serve, MD  [facility] [phone] (Monday-Friday 8 am - 5 pm) [phone] (afterhours)

## 2014-12-23 ENCOUNTER — Emergency Department (HOSPITAL_COMMUNITY): Payer: Medicare Other

## 2014-12-23 ENCOUNTER — Inpatient Hospital Stay (HOSPITAL_COMMUNITY)
Admission: EM | Admit: 2014-12-23 | Discharge: 2015-01-07 | DRG: 871 | Disposition: E | Payer: Medicare Other | Attending: Pulmonary Disease | Admitting: Pulmonary Disease

## 2014-12-23 ENCOUNTER — Encounter (HOSPITAL_COMMUNITY): Payer: Self-pay | Admitting: *Deleted

## 2014-12-23 ENCOUNTER — Inpatient Hospital Stay (HOSPITAL_COMMUNITY): Payer: Medicare Other

## 2014-12-23 DIAGNOSIS — I4891 Unspecified atrial fibrillation: Secondary | ICD-10-CM | POA: Diagnosis present

## 2014-12-23 DIAGNOSIS — F329 Major depressive disorder, single episode, unspecified: Secondary | ICD-10-CM | POA: Diagnosis present

## 2014-12-23 DIAGNOSIS — E1122 Type 2 diabetes mellitus with diabetic chronic kidney disease: Secondary | ICD-10-CM | POA: Diagnosis present

## 2014-12-23 DIAGNOSIS — Z66 Do not resuscitate: Secondary | ICD-10-CM | POA: Diagnosis present

## 2014-12-23 DIAGNOSIS — Z6841 Body Mass Index (BMI) 40.0 and over, adult: Secondary | ICD-10-CM

## 2014-12-23 DIAGNOSIS — Z794 Long term (current) use of insulin: Secondary | ICD-10-CM

## 2014-12-23 DIAGNOSIS — I248 Other forms of acute ischemic heart disease: Secondary | ICD-10-CM | POA: Diagnosis present

## 2014-12-23 DIAGNOSIS — Z803 Family history of malignant neoplasm of breast: Secondary | ICD-10-CM | POA: Diagnosis not present

## 2014-12-23 DIAGNOSIS — K219 Gastro-esophageal reflux disease without esophagitis: Secondary | ICD-10-CM | POA: Diagnosis present

## 2014-12-23 DIAGNOSIS — R197 Diarrhea, unspecified: Secondary | ICD-10-CM | POA: Diagnosis present

## 2014-12-23 DIAGNOSIS — R6521 Severe sepsis with septic shock: Secondary | ICD-10-CM | POA: Diagnosis present

## 2014-12-23 DIAGNOSIS — A419 Sepsis, unspecified organism: Secondary | ICD-10-CM

## 2014-12-23 DIAGNOSIS — L89109 Pressure ulcer of unspecified part of back, unspecified stage: Secondary | ICD-10-CM | POA: Diagnosis present

## 2014-12-23 DIAGNOSIS — E872 Acidosis: Secondary | ICD-10-CM | POA: Insufficient documentation

## 2014-12-23 DIAGNOSIS — R451 Restlessness and agitation: Secondary | ICD-10-CM

## 2014-12-23 DIAGNOSIS — N183 Chronic kidney disease, stage 3 (moderate): Secondary | ICD-10-CM | POA: Diagnosis present

## 2014-12-23 DIAGNOSIS — I5032 Chronic diastolic (congestive) heart failure: Secondary | ICD-10-CM | POA: Diagnosis present

## 2014-12-23 DIAGNOSIS — R4182 Altered mental status, unspecified: Secondary | ICD-10-CM | POA: Insufficient documentation

## 2014-12-23 DIAGNOSIS — J9811 Atelectasis: Secondary | ICD-10-CM | POA: Diagnosis present

## 2014-12-23 DIAGNOSIS — A4152 Sepsis due to Pseudomonas: Principal | ICD-10-CM | POA: Diagnosis present

## 2014-12-23 DIAGNOSIS — E861 Hypovolemia: Secondary | ICD-10-CM | POA: Diagnosis present

## 2014-12-23 DIAGNOSIS — R112 Nausea with vomiting, unspecified: Secondary | ICD-10-CM

## 2014-12-23 DIAGNOSIS — Z79899 Other long term (current) drug therapy: Secondary | ICD-10-CM | POA: Diagnosis not present

## 2014-12-23 DIAGNOSIS — Z87440 Personal history of urinary (tract) infections: Secondary | ICD-10-CM

## 2014-12-23 DIAGNOSIS — D638 Anemia in other chronic diseases classified elsewhere: Secondary | ICD-10-CM | POA: Diagnosis present

## 2014-12-23 DIAGNOSIS — M810 Age-related osteoporosis without current pathological fracture: Secondary | ICD-10-CM | POA: Diagnosis present

## 2014-12-23 DIAGNOSIS — I959 Hypotension, unspecified: Secondary | ICD-10-CM | POA: Diagnosis present

## 2014-12-23 DIAGNOSIS — Z2239 Carrier of other specified bacterial diseases: Secondary | ICD-10-CM | POA: Diagnosis not present

## 2014-12-23 DIAGNOSIS — E876 Hypokalemia: Secondary | ICD-10-CM | POA: Diagnosis present

## 2014-12-23 DIAGNOSIS — Z515 Encounter for palliative care: Secondary | ICD-10-CM | POA: Diagnosis present

## 2014-12-23 DIAGNOSIS — Z833 Family history of diabetes mellitus: Secondary | ICD-10-CM

## 2014-12-23 DIAGNOSIS — I4821 Permanent atrial fibrillation: Secondary | ICD-10-CM | POA: Insufficient documentation

## 2014-12-23 DIAGNOSIS — Z8249 Family history of ischemic heart disease and other diseases of the circulatory system: Secondary | ICD-10-CM

## 2014-12-23 DIAGNOSIS — N390 Urinary tract infection, site not specified: Secondary | ICD-10-CM | POA: Diagnosis present

## 2014-12-23 DIAGNOSIS — G9340 Encephalopathy, unspecified: Secondary | ICD-10-CM | POA: Diagnosis present

## 2014-12-23 DIAGNOSIS — I130 Hypertensive heart and chronic kidney disease with heart failure and stage 1 through stage 4 chronic kidney disease, or unspecified chronic kidney disease: Secondary | ICD-10-CM | POA: Diagnosis present

## 2014-12-23 DIAGNOSIS — H3530 Unspecified macular degeneration: Secondary | ICD-10-CM | POA: Diagnosis present

## 2014-12-23 DIAGNOSIS — E8729 Other acidosis: Secondary | ICD-10-CM | POA: Insufficient documentation

## 2014-12-23 DIAGNOSIS — N179 Acute kidney failure, unspecified: Secondary | ICD-10-CM | POA: Diagnosis not present

## 2014-12-23 DIAGNOSIS — N189 Chronic kidney disease, unspecified: Secondary | ICD-10-CM | POA: Insufficient documentation

## 2014-12-23 LAB — COMPREHENSIVE METABOLIC PANEL
ALT: 37 U/L (ref 14–54)
AST: 63 U/L — AB (ref 15–41)
Albumin: 2.3 g/dL — ABNORMAL LOW (ref 3.5–5.0)
Alkaline Phosphatase: 269 U/L — ABNORMAL HIGH (ref 38–126)
Anion gap: 19 — ABNORMAL HIGH (ref 5–15)
BILIRUBIN TOTAL: 1.6 mg/dL — AB (ref 0.3–1.2)
BUN: 33 mg/dL — AB (ref 6–20)
CHLORIDE: 100 mmol/L — AB (ref 101–111)
CO2: 19 mmol/L — ABNORMAL LOW (ref 22–32)
CREATININE: 2.46 mg/dL — AB (ref 0.44–1.00)
Calcium: 8.3 mg/dL — ABNORMAL LOW (ref 8.9–10.3)
GFR calc Af Amer: 20 mL/min — ABNORMAL LOW (ref >60)
GFR, EST NON AFRICAN AMERICAN: 18 mL/min — AB (ref >60)
GLUCOSE: 153 mg/dL — AB (ref 65–99)
Potassium: 3.5 mmol/L (ref 3.5–5.1)
Sodium: 138 mmol/L (ref 135–145)
Total Protein: 5.3 g/dL — ABNORMAL LOW (ref 6.5–8.1)

## 2014-12-23 LAB — CBC WITH DIFFERENTIAL/PLATELET
Basophils Absolute: 0 10*3/uL (ref 0.0–0.1)
Basophils Relative: 0 %
EOS PCT: 0 %
Eosinophils Absolute: 0 10*3/uL (ref 0.0–0.7)
HEMATOCRIT: 30.5 % — AB (ref 36.0–46.0)
Hemoglobin: 9.9 g/dL — ABNORMAL LOW (ref 12.0–15.0)
LYMPHS ABS: 0.1 10*3/uL — AB (ref 0.7–4.0)
Lymphocytes Relative: 3 %
MCH: 33.1 pg (ref 26.0–34.0)
MCHC: 32.5 g/dL (ref 30.0–36.0)
MCV: 102 fL — AB (ref 78.0–100.0)
MONO ABS: 0.1 10*3/uL (ref 0.1–1.0)
Monocytes Relative: 2 %
NEUTROS ABS: 4 10*3/uL (ref 1.7–7.7)
Neutrophils Relative %: 95 %
Platelets: 163 10*3/uL (ref 150–400)
RBC: 2.99 MIL/uL — AB (ref 3.87–5.11)
RDW: 13.8 % (ref 11.5–15.5)
WBC Morphology: INCREASED
WBC: 4.2 10*3/uL (ref 4.0–10.5)

## 2014-12-23 LAB — I-STAT TROPONIN, ED: TROPONIN I, POC: 0.11 ng/mL — AB (ref 0.00–0.08)

## 2014-12-23 LAB — I-STAT CHEM 8, ED
BUN: 32 mg/dL — ABNORMAL HIGH (ref 6–20)
Calcium, Ion: 1.07 mmol/L — ABNORMAL LOW (ref 1.13–1.30)
Chloride: 98 mmol/L — ABNORMAL LOW (ref 101–111)
Creatinine, Ser: 2.2 mg/dL — ABNORMAL HIGH (ref 0.44–1.00)
Glucose, Bld: 146 mg/dL — ABNORMAL HIGH (ref 65–99)
HEMATOCRIT: 31 % — AB (ref 36.0–46.0)
HEMOGLOBIN: 10.5 g/dL — AB (ref 12.0–15.0)
POTASSIUM: 3.5 mmol/L (ref 3.5–5.1)
SODIUM: 136 mmol/L (ref 135–145)
TCO2: 18 mmol/L (ref 0–100)

## 2014-12-23 LAB — I-STAT CG4 LACTIC ACID, ED: Lactic Acid, Venous: 8.29 mmol/L (ref 0.5–2.0)

## 2014-12-23 LAB — PROCALCITONIN: PROCALCITONIN: 31.6 ng/mL

## 2014-12-23 LAB — LACTIC ACID, PLASMA: LACTIC ACID, VENOUS: 7.5 mmol/L — AB (ref 0.5–2.0)

## 2014-12-23 LAB — TROPONIN I: Troponin I: 0.44 ng/mL — ABNORMAL HIGH (ref <0.031)

## 2014-12-23 MED ORDER — INSULIN ASPART 100 UNIT/ML ~~LOC~~ SOLN: 0.0000 [IU] | SUBCUTANEOUS | Status: DC

## 2014-12-23 MED ORDER — PANTOPRAZOLE SODIUM 40 MG IV SOLR
40.0000 mg | INTRAVENOUS | Status: DC
  Administered 2014-12-24 – 2014-12-26 (×4): 40 mg via INTRAVENOUS
  Filled 2014-12-23 (×4): qty 40

## 2014-12-23 MED ORDER — IPRATROPIUM-ALBUTEROL 0.5-2.5 (3) MG/3ML IN SOLN
3.0000 mL | Freq: Four times a day (QID) | RESPIRATORY_TRACT | Status: DC
  Administered 2014-12-23 – 2014-12-26 (×8): 3 mL via RESPIRATORY_TRACT
  Filled 2014-12-23 (×12): qty 3

## 2014-12-23 MED ORDER — SODIUM CHLORIDE 0.9 % IV SOLN
INTRAVENOUS | Status: DC
  Administered 2014-12-23: 125 mL via INTRAVENOUS
  Administered 2014-12-24 – 2014-12-25 (×2): via INTRAVENOUS
  Administered 2014-12-25: 1000 mL via INTRAVENOUS
  Administered 2014-12-25: 04:00:00 via INTRAVENOUS

## 2014-12-23 MED ORDER — SODIUM CHLORIDE 0.9 % IV BOLUS (SEPSIS): 500.0000 mL | INTRAVENOUS | Status: AC

## 2014-12-23 MED ORDER — PIPERACILLIN-TAZOBACTAM 3.375 G IVPB: 3.3750 g | Freq: Three times a day (TID) | INTRAVENOUS | Status: DC

## 2014-12-23 MED ORDER — SODIUM CHLORIDE 0.9 % IV SOLN: INTRAVENOUS | Status: DC | PRN

## 2014-12-23 MED ORDER — NOREPINEPHRINE BITARTRATE 1 MG/ML IV SOLN
2.0000 ug/min | INTRAVENOUS | Status: DC
  Administered 2014-12-23: 8 ug/min via INTRAVENOUS
  Filled 2014-12-23: qty 4

## 2014-12-23 MED ORDER — SODIUM CHLORIDE 0.9 % IV BOLUS (SEPSIS)
1000.0000 mL | INTRAVENOUS | Status: AC
  Administered 2014-12-23 (×3): 1000 mL via INTRAVENOUS

## 2014-12-23 MED ORDER — INSULIN ASPART 100 UNIT/ML ~~LOC~~ SOLN
0.0000 [IU] | SUBCUTANEOUS | Status: DC
  Administered 2014-12-24 (×3): 3 [IU] via SUBCUTANEOUS
  Administered 2014-12-24: 2 [IU] via SUBCUTANEOUS
  Administered 2014-12-24 (×2): 5 [IU] via SUBCUTANEOUS
  Administered 2014-12-25: 2 [IU] via SUBCUTANEOUS
  Administered 2014-12-25 (×3): 3 [IU] via SUBCUTANEOUS
  Administered 2014-12-25 – 2014-12-26 (×2): 2 [IU] via SUBCUTANEOUS

## 2014-12-23 MED ORDER — VANCOMYCIN HCL 10 G IV SOLR
1500.0000 mg | INTRAVENOUS | Status: DC
  Administered 2014-12-25: 1500 mg via INTRAVENOUS
  Filled 2014-12-23: qty 1500

## 2014-12-23 MED ORDER — HEPARIN SODIUM (PORCINE) 5000 UNIT/ML IJ SOLN
5000.0000 [IU] | Freq: Three times a day (TID) | INTRAMUSCULAR | Status: DC
  Administered 2014-12-24 – 2014-12-26 (×9): 5000 [IU] via SUBCUTANEOUS
  Filled 2014-12-23 (×9): qty 1

## 2014-12-23 MED ORDER — NOREPINEPHRINE BITARTRATE 1 MG/ML IV SOLN
2.0000 ug/min | INTRAVENOUS | Status: DC
  Filled 2014-12-23: qty 4

## 2014-12-23 MED ORDER — SODIUM CHLORIDE 0.9 % IV SOLN
250.0000 mg | Freq: Four times a day (QID) | INTRAVENOUS | Status: DC
  Administered 2014-12-23 – 2014-12-26 (×11): 250 mg via INTRAVENOUS
  Filled 2014-12-23 (×15): qty 250

## 2014-12-23 MED ORDER — SODIUM CHLORIDE 0.9 % IV SOLN: 250.0000 mL | INTRAVENOUS | Status: DC | PRN

## 2014-12-23 MED ORDER — VANCOMYCIN HCL IN DEXTROSE 1-5 GM/200ML-% IV SOLN
1000.0000 mg | Freq: Once | INTRAVENOUS | Status: AC
  Administered 2014-12-23: 1000 mg via INTRAVENOUS
  Filled 2014-12-23: qty 200

## 2014-12-23 MED ORDER — PIPERACILLIN-TAZOBACTAM 3.375 G IVPB 30 MIN
3.3750 g | Freq: Once | INTRAVENOUS | Status: AC
  Administered 2014-12-23: 3.375 g via INTRAVENOUS
  Filled 2014-12-23: qty 50

## 2014-12-23 MED ORDER — ACETAMINOPHEN 650 MG RE SUPP
650.0000 mg | Freq: Once | RECTAL | Status: AC
  Administered 2014-12-23: 650 mg via RECTAL
  Filled 2014-12-23: qty 1

## 2014-12-23 MED ORDER — ONDANSETRON HCL 4 MG/2ML IJ SOLN
4.0000 mg | Freq: Once | INTRAMUSCULAR | Status: AC
  Administered 2014-12-23: 4 mg via INTRAVENOUS
  Filled 2014-12-23: qty 2

## 2014-12-23 MED ORDER — SODIUM CHLORIDE 0.9 % IV SOLN
1.0000 g | Freq: Once | INTRAVENOUS | Status: AC
  Administered 2014-12-24: 1 g via INTRAVENOUS
  Filled 2014-12-23: qty 10

## 2014-12-23 NOTE — Progress Notes (Addendum)
ANTIBIOTIC CONSULT NOTE - INITIAL  Pharmacy Consult for vancomycin and zosyn Indication: rule out sepsis  Allergies  Allergen Reactions  . Celebrex [Celecoxib]   . Nsaids   . Sulfa Antibiotics     Patient Measurements: Height: 5\' 10"  (177.8 cm) Weight: 236 lb (107.049 kg) IBW/kg (Calculated) : 68.5  Vital Signs: Temp: 101.8 F (38.8 C) (10/17 1905) Temp Source: Rectal (10/17 1905) BP: 78/42 mmHg (10/17 1850) Pulse Rate: 145 (10/17 1857) Intake/Output from previous day:   Intake/Output from this shift:    Labs:  Recent Labs  12/14/2014 1904  HGB 10.5*  CREATININE 2.20*   Estimated Creatinine Clearance: 27.5 mL/min (by C-G formula based on Cr of 2.2). No results for input(s): VANCOTROUGH, VANCOPEAK, VANCORANDOM, GENTTROUGH, GENTPEAK, GENTRANDOM, TOBRATROUGH, TOBRAPEAK, TOBRARND, AMIKACINPEAK, AMIKACINTROU, AMIKACIN in the last 72 hours.   Microbiology: Recent Results (from the past 720 hour(s))  Urine culture     Status: None   Collection Time: 11/30/14 10:30 PM  Result Value Ref Range Status   Specimen Description URINE, CATHETERIZED  Final   Special Requests NONE  Final   Culture   Final    >=100,000 COLONIES/mL ESCHERICHIA COLI Confirmed Extended Spectrum Beta-Lactamase Producer (ESBL) Performed at [facility]    Report Status 12/04/2014 FINAL  Final   Organism ID, Bacteria ESCHERICHIA COLI  Final      Susceptibility   Escherichia coli - MIC*    AMPICILLIN >=32 RESISTANT Resistant     CEFAZOLIN >=64 RESISTANT Resistant     CEFTRIAXONE >=64 RESISTANT Resistant     CIPROFLOXACIN >=4 RESISTANT Resistant     GENTAMICIN >=16 RESISTANT Resistant     IMIPENEM <=0.25 SENSITIVE Sensitive     NITROFURANTOIN <=16 SENSITIVE Sensitive     TRIMETH/SULFA <=20 SENSITIVE Sensitive     AMPICILLIN/SULBACTAM >=32 RESISTANT Resistant     PIP/TAZO >=128 RESISTANT Resistant     * >=100,000 COLONIES/mL ESCHERICHIA COLI  Blood culture (routine x 2)     Status: None   Collection Time: 12/04/14  6:00 PM  Result Value Ref Range Status   Specimen Description BLOOD LEFT HAND  Final   Special Requests BOTTLES DRAWN AEROBIC AND ANAEROBIC 5CC EACH  Final   Culture   Final    NO GROWTH 5 DAYS Performed at [facility]    Report Status 12/09/2014 FINAL  Final  Blood culture (routine x 2)     Status: None   Collection Time: 12/04/14  6:00 PM  Result Value Ref Range Status   Specimen Description BLOOD RIGHT HAND  Final   Special Requests BOTTLES DRAWN AEROBIC AND ANAEROBIC 5CC EACH  Final   Culture   Final    NO GROWTH 5 DAYS Performed at [facility]    Report Status 12/09/2014 FINAL  Final  MRSA PCR Screening     Status: Abnormal   Collection Time: 12/04/14 11:16 PM  Result Value Ref Range Status   MRSA by PCR POSITIVE (A) NEGATIVE Final    Comment:        The GeneXpert MRSA Assay (FDA approved for NASAL specimens only), is one component of a comprehensive MRSA colonization surveillance program. It is not intended to diagnose MRSA infection nor to guide or monitor treatment for MRSA infections. RESULT CALLED TO, READ BACK BY AND VERIFIED WITH: NI,A/2W@0315  ON 12/05/14 BY KARCZEWSKI,S.   Urine culture     Status: None   Collection Time: 12/06/14 11:48 PM  Result Value Ref Range Status   Specimen  Description URINE, CATHETERIZED  Final   Special Requests NONE  Final   Culture   Final    NO GROWTH 1 DAY Performed at [facility]    Report Status 12/08/2014 FINAL  Final  C difficile quick scan w PCR reflex     Status: Abnormal   Collection Time: 12/07/14  6:00 PM  Result Value Ref Range Status   C Diff antigen POSITIVE (A) NEGATIVE Final   C Diff toxin NEGATIVE NEGATIVE Final   C Diff interpretation   Final    C. difficile present, but toxin not detected. This indicates colonization. In most cases, this does not require treatment. If patient has signs and symptoms consistent with colitis, consider treatment. Requires  ENTERIC precautions.    Medical History: Past Medical History  Diagnosis Date  . Osteoporosis   . HTN (hypertension)     unspecified  . Atrial fibrillation ([place])   . Macular degeneration   . Osteoarthritis   . Edema   . Anemia   . Hydronephrosis     left  . UTI (lower urinary tract infection)   . Pressure ulcer of back   . DM (diabetes mellitus) ([place])     lantus  . Depression   . Insomnia   . GERD (gastroesophageal reflux disease)   . CKD (chronic kidney disease), stage III   . Chronic diastolic CHF (congestive heart failure) ([facility])     Assessment: Patient's a 81 y.o F with hx ESBL UTI who was recently hospitalized earlier this month for sepsis. She  presented to [facility] ED from [facility] on 10/17 with c/o vaginal swelling.  In the ED, she was found to be febrile, hypotensive, and have elevated LA and scr.  To start broad abx for sepsis.  - Tmax 101.8, wbc 10.5, scr 2.20 (crcl~23 N) - LA 8.29  Goal of Therapy:  Vancomycin trough level 15-20 mcg/ml  Plan:  - vancomycin 1gm IV x1 in addition to the 1 gm ordered earlier to get 2 gm total for today, then 1500mg  IV q48h - zosyn 3.375 gm IV x1 over 30 minutes, then 3.375 gm IV q8h (over 4 hours) - f/u renal function  Salvadore Valvano P 12/09/2014,7:18 PM  Adden: with hx ESBL UTI, to change zosyn to primaxin per Dr. Regenia Skeeter.  - primaxin 250mg  IV q6h  Dia Sitter, PharmD, BCPS 12/13/2014 8:54 PM

## 2014-12-23 NOTE — ED Provider Notes (Signed)
CSN: [id number]     Arrival date & time 12/28/2014  1811 History   First MD Initiated Contact with Patient 12/17/2014 1841     Chief Complaint  Patient presents with  . Urinary Retention     (Consider location/radiation/quality/duration/timing/severity/associated sxs/prior Treatment) HPI  81 year old female presents from the nursing home with decreased urine output and inability to place a Foley catheter. Patient reportedly has been having diarrhea as well. Patient unable to provide a history at this time. She is awake and alert but appears disoriented. Comes in with a blood pressure in the 78G systolic. Heart rate in the 140s. Has a butterfly IV in her thigh that is slowly infusing fluids. Nursing home was unable to place a Foley due to "vaginal swelling". Recently admitted and discharged with sepsis earlier this month.  Past Medical History  Diagnosis Date  . Osteoporosis   . HTN (hypertension)     unspecified  . Atrial fibrillation ([place])   . Macular degeneration   . Osteoarthritis   . Edema   . Anemia   . Hydronephrosis     left  . UTI (lower urinary tract infection)   . Pressure ulcer of back   . DM (diabetes mellitus) ([place])     lantus  . Depression   . Insomnia   . GERD (gastroesophageal reflux disease)   . CKD (chronic kidney disease), stage III   . Chronic diastolic CHF (congestive heart failure) [facility])    Past Surgical History  Procedure Laterality Date  . Replacement total knee  08/2007    bilateral  . Appendectomy    . Cholecystectomy    . Colonoscopy    . Upper gastrointestinal endoscopy    . Cystoscopy w/ ureteral stent placement  04/29/2011    Procedure: CYSTOSCOPY WITH RETROGRADE PYELOGRAM/URETERAL STENT PLACEMENT;  Surgeon: Bernestine Amass, MD;  Location: [facility];  Service: Urology;  Laterality: Bilateral;  bilateral retrograde, double j stent. Cystogram   . Ureteroscopy  04/29/2011    Procedure: URETEROSCOPY;  Surgeon: Bernestine Amass, MD;  Location: [facility];   Service: Urology;  Laterality: Right;  . Cystoscopy with ureteroscopy  05/31/2011    Procedure: CYSTOSCOPY WITH URETEROSCOPY;  Surgeon: Bernestine Amass, MD;  Location: [facility];  Service: Urology;  Laterality: Right;  Ureteral biopsy Right    . Ercp N/A 12/07/2014    Procedure: ENDOSCOPIC RETROGRADE CHOLANGIOPANCREATOGRAPHY (ERCP);  Surgeon: Inda Castle, MD;  Location: [facility];  Service: Endoscopy;  Laterality: N/A;   Family History  Problem Relation Age of Onset  . Diabetes Father   . Atrial fibrillation Father   . Breast cancer Paternal Grandmother   . Heart disease Mother    Social History  Substance Use Topics  . Smoking status: Never Smoker   . Smokeless tobacco: Never Used  . Alcohol Use: No   OB History    No data available     Review of Systems  Unable to perform ROS: Unstable vital signs      Allergies  Celebrex; Nsaids; and Sulfa antibiotics  Home Medications   Prior to Admission medications   Medication Sig Start Date End Date Taking? Authorizing Provider  AMBULATORY NON FORMULARY MEDICATION Medication Name: Med Pass 240 mL with water three times daily for hydration    Historical Provider, MD  amLODipine (NORVASC) 10 MG tablet Take 10 mg by mouth daily.    Historical Provider, MD  benzonatate (TESSALON) 100 MG capsule Take 100 mg by mouth 3 (three) times  daily as needed for cough (x 7 days starting 08/20/14).    Historical Provider, MD  beta carotene w/minerals (OCUVITE) tablet Take 1 tablet by mouth 2 (two) times daily.     Historical Provider, MD  Calcium Carbonate-Vitamin D 600-400 MG-UNIT per tablet Take 1 tablet by mouth 2 (two) times daily.     Historical Provider, MD  Cholecalciferol (VITAMIN D3) 2000 UNITS TABS Take 1 tablet by mouth daily.     Historical Provider, MD  feeding supplement, GLUCERNA SHAKE, (GLUCERNA SHAKE) LIQD Take 237 mLs by mouth 2 (two) times daily between meals. 12/13/14   Barton Dubois, MD  furosemide (LASIX) 20 MG tablet Take 2 tablets  (40 mg total) by mouth 2 (two) times daily. 12/13/14   Barton Dubois, MD  gabapentin (NEURONTIN) 300 MG capsule Take 300 mg by mouth at bedtime.     Historical Provider, MD  hydrOXYzine (ATARAX/VISTARIL) 25 MG tablet Take 25 mg by mouth every 8 (eight) hours as needed for itching.    Historical Provider, MD  insulin lispro (HUMALOG) 100 UNIT/ML cartridge Inject into the skin. Check blood sugar three times daily with meals if CBG> 200 inject 5 units subcutaneously     Historical Provider, MD  ipratropium-albuterol (DUONEB) 0.5-2.5 (3) MG/3ML SOLN Take 3 mLs by nebulization. Administer 3 ml via nebulizer four times a day as needed for cough and wheezing    Historical Provider, MD  metoprolol tartrate (LOPRESSOR) 25 MG tablet Take 1 tablet (25 mg total) by mouth 2 (two) times daily. 12/13/14   Barton Dubois, MD  Multiple Vitamins-Minerals (DECUBI-VITE) CAPS Take 1 capsule by mouth daily.     Historical Provider, MD  ondansetron (ZOFRAN) 4 MG tablet Take 4 mg by mouth every 8 (eight) hours as needed for nausea or vomiting.    Historical Provider, MD  pantoprazole (PROTONIX) 40 MG tablet Take 1 tablet (40 mg total) by mouth daily at 12 noon. 12/13/14   Barton Dubois, MD  polyethylene glycol [facility] [place]) packet Take 17 g by mouth daily as needed for moderate constipation. For constipation 12/13/14   Barton Dubois, MD  potassium chloride SA (K-DUR,KLOR-CON) 20 MEQ tablet Take 2 tablets (40 mEq total) by mouth daily. 12/13/14   Barton Dubois, MD  Protein (PROCEL) POWD Take by mouth. 1 scoop in 6-8 oz of liquid and take by mouth twice daily for nutritional support and increase protein    Historical Provider, MD  saccharomyces boulardii (FLORASTOR) 250 MG capsule Take 1 capsule (250 mg total) by mouth 2 (two) times daily. 12/13/14   Barton Dubois, MD  simethicone (MYLICON) 80 MG chewable tablet Chew 160 mg by mouth 3 (three) times daily. For gas pains.    Historical Provider, MD   BP 78/42 mmHg  Pulse 145   Temp(Src) 101.8 F (38.8 C) (Rectal)  Resp 27  Ht 5\' 10"  (1.778 m)  Wt 236 lb (107.049 kg)  BMI 33.86 kg/m2  SpO2 88% Physical Exam  Constitutional: She appears well-developed and well-nourished.  Morbidly obese  HENT:  Head: Normocephalic and atraumatic.  Right Ear: External ear normal.  Left Ear: External ear normal.  Nose: Nose normal.  Eyes: Right eye exhibits no discharge. Left eye exhibits no discharge.  Cardiovascular: Normal heart sounds.  An irregularly irregular rhythm present. Tachycardia present.   Pulmonary/Chest: Effort normal and breath sounds normal.  Abdominal: Soft. There is no tenderness.  Neurological: She is alert. She is disoriented.  Skin: Skin is warm and dry. She is not  diaphoretic. There is pallor.  Nursing note and vitals reviewed.   ED Course  .Central Line Date/Time: 12/11/2014 10:58 PM Performed by: Sherwood Gambler Authorized by: Sherwood Gambler Consent: Verbal consent obtained. Risks and benefits: risks, benefits and alternatives were discussed Consent given by: power of attorney Time out: Immediately prior to procedure a "time out" was called to verify the correct patient, procedure, equipment, support staff and site/side marked as required. Indications: vascular access Anesthesia: local infiltration Patient sedated: no Preparation: skin prepped with ChloraPrep Skin prep agent dried: skin prep agent completely dried prior to procedure Sterile barriers: all five maximum sterile barriers used - cap, mask, sterile gown, sterile gloves, and large sterile sheet Hand hygiene: hand hygiene performed prior to central venous catheter insertion Location details: right internal jugular Patient position: Trendelenburg Catheter type: triple lumen Pre-procedure: landmarks identified Ultrasound guidance: yes Sterile ultrasound techniques: sterile gel and sterile probe covers were used Number of attempts: 2 Successful placement: yes Post-procedure: line  sutured and dressing applied Assessment: blood return through all ports,  free fluid flow,  placement verified by x-ray and no pneumothorax on x-ray Patient tolerance: Patient tolerated the procedure well with no immediate complications   (including critical care time) Labs Review Labs Reviewed  CBC WITH DIFFERENTIAL/PLATELET - Abnormal; Notable for the following:    RBC 2.99 (*)    Hemoglobin 9.9 (*)    HCT 30.5 (*)    MCV 102.0 (*)    All other components within normal limits  I-STAT CG4 LACTIC ACID, ED - Abnormal; Notable for the following:    Lactic Acid, Venous 8.29 (*)    All other components within normal limits  I-STAT TROPOININ, ED - Abnormal; Notable for the following:    Troponin i, poc 0.11 (*)    All other components within normal limits  I-STAT CHEM 8, ED - Abnormal; Notable for the following:    Chloride 98 (*)    BUN 32 (*)    Creatinine, Ser 2.20 (*)    Glucose, Bld 146 (*)    Calcium, Ion 1.07 (*)    Hemoglobin 10.5 (*)    HCT 31.0 (*)    All other components within normal limits  CULTURE, BLOOD (ROUTINE X 2)  CULTURE, BLOOD (ROUTINE X 2)  URINE CULTURE  COMPREHENSIVE METABOLIC PANEL  URINALYSIS, ROUTINE W REFLEX MICROSCOPIC (NOT AT [facility])    Imaging Review Dg Chest Portable 1 View  12/21/2014  CLINICAL DATA:  Central line placement.  Initial encounter. EXAM: PORTABLE CHEST 1 VIEW COMPARISON:  Chest radiograph performed earlier today at 7:15 p.m. FINDINGS: The patient's right IJ line is noted ending about the mid SVC. The lungs are hypoexpanded. Mild vascular crowding and vascular congestion are seen. Mildly increased interstitial markings could reflect minimal interstitial edema. No pleural effusion or pneumothorax is seen. The cardiomediastinal silhouette is borderline normal in size. No acute osseous abnormalities are identified. IMPRESSION: 1. Right IJ line noted ending about the mid SVC. 2. Lungs hypoexpanded. Mild vascular congestion noted. Mildly  increased interstitial markings could reflect minimal interstitial edema. Electronically Signed   By: Garald Balding M.D.   On: 12/08/2014 21:39   Dg Chest Port 1 View  12/31/2014  CLINICAL DATA:  EMS, hypotension, urinary retention. EXAM: PORTABLE CHEST 1 VIEW COMPARISON:  Radiograph 12/04/2014 FINDINGS: Normal cardiac silhouette. There is chronic elevation LEFT hemidiaphragm. There is bibasilar atelectasis not changed from prior. No pulmonary edema. IMPRESSION: Bibasilar atelectasis and chronic elevation of the RIGHT hemidiaphragm. No significant interval change.  Electronically Signed   By: Suzy Bouchard M.D.   On: 12/18/2014 19:35   I have personally reviewed and evaluated these images and lab results as part of my medical decision-making.   EKG Interpretation   Date/Time:  Monday December 23 2014 18:38:27 EDT Ventricular Rate:  160 PR Interval:    QRS Duration: 74 QT Interval:  276 QTC Calculation: 450 R Axis:   55 Text Interpretation:  Atrial fibrillation with rapid V-rate Probable  anterior infarct, old Repolarization abnormality, prob rate related rate  faster compared to Sept 2016 Confirmed by Regenia Skeeter  MD, [place] ([id number]) on  12/10/2014 7:12:38 PM      CRITICAL CARE Performed by: Sherwood Gambler T  Total critical care time: 75 minutes  Critical care time was exclusive of separately billable procedures and treating other patients.  Critical care was necessary to treat or prevent imminent or life-threatening deterioration.  Critical care was time spent personally by me on the following activities: development of treatment plan with patient and/or surrogate as well as nursing, discussions with consultants, evaluation of patient's response to treatment, examination of patient, obtaining history from patient or surrogate, ordering and performing treatments and interventions, ordering and review of laboratory studies, ordering and review of radiographic studies, pulse oximetry and  re-evaluation of patient's condition.  MDM   Final diagnoses:  Septic shock ([place])  Lactic Acidosis  Patient is critically ill with what is likely septic shock. While she has tachycardia with afib and hypotension, this appears to be from sepsis and dehydration with lactic acidosis and thus I do not feel cardioversion would be beneficial. Most likely this is from a UTI given her past UTIs and history of ESBL. Unable to obtain urine at this time, initially thought to be due to decreased urine output from shock. However after discussion with urology this turns out she has a very contracted bladder and is difficult to pass a Foley. Urology will consult on the patient. She has no localizing symptoms but is confused from her baseline. I had a discussion with her son and power of attorney, Elayne Gruver, who confirm she is a DO NOT RESUSCITATE/DO NOT INTUBATE but would want treatment up until pressors and central line. After IV fluid she is still hypotensive and thus a central line was placed as above and she was started on broad antibiotics. Blood pressure did come up. She was given broad antibiotics and then after further evaluation her antibiotic changed to imipenem given history of ESBL. Son and family updated at the bedside. Admitted to the ICU in critical condition.    Sherwood Gambler, MD 12/14/2014 2351

## 2014-12-23 NOTE — H&P (Signed)
PULMONARY / CRITICAL CARE MEDICINE   Name: Kathryn [name] MRN: [id number] DOB: 10/02/1933    ADMISSION DATE:  12/20/2014 CONSULTATION DATE:  12/07/2014  REFERRING MD :  EDP  CHIEF COMPLAINT:  Hypotension  INITIAL PRESENTATION:  81 y.o. F from nursing home, brought to [facility] ED 10/17 after staff unable to place foley due to vaginal swelling.  In ED, found to be in shock, PCCM called for admission.    STUDIES:  CXR 01/05/2015 >>> bibasilar atx, chronic elevation of right hemidiaphragm.  SIGNIFICANT EVENTS: 10/17 - admitted with shock, septic vs hypovolemic.   HISTORY OF PRESENT ILLNESS:  Pt is encephalopathic; therefore, this HPI is obtained from chart review. Kathryn [name] is a 81 y.o. F with PMH as outlined below who resides in an assisted living facility [facility]).  She was brought to [facility] ED 12/26/2014 after nursing staff was unable to place a foley due to vaginal swelling (foley attempted due to urinary retention).  In ED, she was found to be profoundly hypotensive and had lactate of 8.  BP did not respond to initial fluid challenge, therefore, PCCM was called for admission on shock; septic vs hypovolemic.  Of note, she had recent admission 12/04/14 through 12/13/14 for urosepsis.  Urine culture from 11/30/14 was positive for E.Coli, ESBL positive (sens to imipenem).   PAST MEDICAL HISTORY :   has a past medical history of Osteoporosis; HTN (hypertension); Atrial fibrillation ([place]); Macular degeneration; Osteoarthritis; Edema; Anemia; Hydronephrosis; UTI (lower urinary tract infection); Pressure ulcer of back; DM (diabetes mellitus) ([place]); Depression; Insomnia; GERD (gastroesophageal reflux disease); CKD (chronic kidney disease), stage III; and Chronic diastolic CHF (congestive heart failure) ([place]).  has past surgical history that includes Replacement total knee (08/2007); Appendectomy; Cholecystectomy; Colonoscopy; Upper gastrointestinal endoscopy; Cystoscopy w/ ureteral stent placement  (04/29/2011); Ureteroscopy (04/29/2011); Cystoscopy with ureteroscopy (05/31/2011); and ERCP (N/A, 12/07/2014). Prior to Admission medications   Medication Sig Start Date End Date Taking? Authorizing Provider  AMBULATORY NON FORMULARY MEDICATION Medication Name: Med Pass 240 mL with water three times daily for hydration    Historical Provider, MD  amLODipine (NORVASC) 10 MG tablet Take 10 mg by mouth daily.    Historical Provider, MD  benzonatate (TESSALON) 100 MG capsule Take 100 mg by mouth 3 (three) times daily as needed for cough (x 7 days starting 08/20/14).    Historical Provider, MD  beta carotene w/minerals (OCUVITE) tablet Take 1 tablet by mouth 2 (two) times daily.     Historical Provider, MD  Calcium Carbonate-Vitamin D 600-400 MG-UNIT per tablet Take 1 tablet by mouth 2 (two) times daily.     Historical Provider, MD  Cholecalciferol (VITAMIN D3) 2000 UNITS TABS Take 1 tablet by mouth daily.     Historical Provider, MD  feeding supplement, GLUCERNA SHAKE, (GLUCERNA SHAKE) LIQD Take 237 mLs by mouth 2 (two) times daily between meals. 12/13/14   Barton Dubois, MD  furosemide (LASIX) 20 MG tablet Take 2 tablets (40 mg total) by mouth 2 (two) times daily. 12/13/14   Barton Dubois, MD  gabapentin (NEURONTIN) 300 MG capsule Take 300 mg by mouth at bedtime.     Historical Provider, MD  hydrOXYzine (ATARAX/VISTARIL) 25 MG tablet Take 25 mg by mouth every 8 (eight) hours as needed for itching.    Historical Provider, MD  insulin lispro (HUMALOG) 100 UNIT/ML cartridge Inject into the skin. Check blood sugar three times daily with meals if CBG> 200 inject 5 units subcutaneously     Historical Provider,  MD  ipratropium-albuterol (DUONEB) 0.5-2.5 (3) MG/3ML SOLN Take 3 mLs by nebulization. Administer 3 ml via nebulizer four times a day as needed for cough and wheezing    Historical Provider, MD  metoprolol tartrate (LOPRESSOR) 25 MG tablet Take 1 tablet (25 mg total) by mouth 2 (two) times daily. 12/13/14    Barton Dubois, MD  Multiple Vitamins-Minerals (DECUBI-VITE) CAPS Take 1 capsule by mouth daily.     Historical Provider, MD  ondansetron (ZOFRAN) 4 MG tablet Take 4 mg by mouth every 8 (eight) hours as needed for nausea or vomiting.    Historical Provider, MD  pantoprazole (PROTONIX) 40 MG tablet Take 1 tablet (40 mg total) by mouth daily at 12 noon. 12/13/14   Barton Dubois, MD  polyethylene glycol [facility] [place]) packet Take 17 g by mouth daily as needed for moderate constipation. For constipation 12/13/14   Barton Dubois, MD  potassium chloride SA (K-DUR,KLOR-CON) 20 MEQ tablet Take 2 tablets (40 mEq total) by mouth daily. 12/13/14   Barton Dubois, MD  Protein (PROCEL) POWD Take by mouth. 1 scoop in 6-8 oz of liquid and take by mouth twice daily for nutritional support and increase protein    Historical Provider, MD  saccharomyces boulardii (FLORASTOR) 250 MG capsule Take 1 capsule (250 mg total) by mouth 2 (two) times daily. 12/13/14   Barton Dubois, MD  simethicone (MYLICON) 80 MG chewable tablet Chew 160 mg by mouth 3 (three) times daily. For gas pains.    Historical Provider, MD   Allergies  Allergen Reactions  . Celebrex [Celecoxib]   . Nsaids   . Sulfa Antibiotics     FAMILY HISTORY:  Family History  Problem Relation Age of Onset  . Diabetes Father   . Atrial fibrillation Father   . Breast cancer Paternal Grandmother   . Heart disease Mother     SOCIAL HISTORY:  reports that she has never smoked. She has never used smokeless tobacco. She reports that she does not drink alcohol or use illicit drugs.  REVIEW OF SYSTEMS:  Unable to obtain as pt is encephalopathic.  SUBJECTIVE:   VITAL SIGNS: Temp:  [101.8 F (38.8 C)] 101.8 F (38.8 C) (10/17 1905) Pulse Rate:  [145] 145 (10/17 1857) Resp:  [20-27] 20 (10/17 1930) BP: (46-78)/(20-42) 58/40 mmHg (10/17 2017) SpO2:  [88 %] 88 % (10/17 1857) Weight:  [107.049 kg (236 lb)] 107.049 kg (236 lb) (10/17 1908) HEMODYNAMICS:    VENTILATOR SETTINGS:   INTAKE / OUTPUT: Intake/Output    None     PHYSICAL EXAMINATION: General: Chronically ill appearing female, in NAD. Neuro: Awake but does not follow commands appropriately.  Mumbles her name and random words. HEENT: [place]/AT. PERRL, sclerae anicteric. Cardiovascular: Heart sounds distant, IRIR, no M/R/G.  Lungs: Respirations even and unlabored.  Diminished bilaterally though CTA without W/R/R. Abdomen:  Obese, BS x 4, soft, NT/ND.  Musculoskeletal: No gross deformities, no edema. Bilateral ankles in pressure relief boots. Skin: Intact, warm, no rashes.  LABS:  CBC  Recent Labs Lab 12/24/2014 1852 12/31/2014 1904  WBC 4.2  --   HGB 9.9* 10.5*  HCT 30.5* 31.0*  PLT 163  --    Coag's No results for input(s): APTT, INR in the last 168 hours. BMET  Recent Labs Lab 12/14/2014 1852 12/15/2014 1904  NA 138 136  K 3.5 3.5  CL 100* 98*  CO2 19*  --   BUN 33* 32*  CREATININE 2.46* 2.20*  GLUCOSE 153* 146*   Electrolytes  Recent Labs Lab 12/09/2014 1852  CALCIUM 8.3*   Sepsis Markers  Recent Labs Lab 12/08/2014 1904  LATICACIDVEN 8.29*   ABG No results for input(s): PHART, PCO2ART, PO2ART in the last 168 hours. Liver Enzymes  Recent Labs Lab 01/06/2015 1852  AST 63*  ALT 37  ALKPHOS 269*  BILITOT 1.6*  ALBUMIN 2.3*   Cardiac Enzymes No results for input(s): TROPONINI, PROBNP in the last 168 hours. Glucose No results for input(s): GLUCAP in the last 168 hours.  Imaging Dg Chest Port 1 View  12/16/2014  CLINICAL DATA:  EMS, hypotension, urinary retention. EXAM: PORTABLE CHEST 1 VIEW COMPARISON:  Radiograph 12/04/2014 FINDINGS: Normal cardiac silhouette. There is chronic elevation LEFT hemidiaphragm. There is bibasilar atelectasis not changed from prior. No pulmonary edema. IMPRESSION: Bibasilar atelectasis and chronic elevation of the RIGHT hemidiaphragm. No significant interval change. Electronically Signed   By: Suzy Bouchard M.D.   On:  01/02/2015 19:35     ASSESSMENT / PLAN:  CARDIOVASCULAR CVL R IJ 10/17 >>> A:  Shock - septic vs hypovolemic. Hx HTN, A.fib (not on anticoagulation due to hx GI bleed), dCHF (last echo from 2009 with EF  60-65%). DO NOT RESUSCITATE. P:  Norepinephrine as needed to maintain goal MAP > 65. Goal CVP 8 - 12. Trend troponin / lactate. Check cortisol. Repeat echo. Hold outpatient amlodipine, furosemide, lopressor.  INFECTIOUS A:   Shock - septic vs hypovolemic. Pressure ulcers of back. Hx ESBL UTI. P:   BCx2 10/17 > UCx 10/17 > Abx: Vanc, start date 10/17, day 1/x. Abx: Imipenem, start date 10/17, day 1/x. PCT algorithm to limit abx exposure. Wound care consult.  PULMONARY A: Bibasilar atelectasis. Chronically elevated right hemidiaphragm. DO NOT INTUBATE. P:   Incentive spirometry. DuoNebs. CXR intermittently.  RENAL A:   AoCKD. AG metabolic acidosis - lactate. Hypocalcemia. P:   NS @ 125. BMP in AM.  GASTROINTESTINAL A:   GERD. Nutrition. P:   Pantoprazole. NPO.  HEMATOLOGIC A:   Anemia of chronic disease. VTE Prophylaxis. P:  Transfuse for Hgb < 7. SCD's / Heparin. CBC in AM.  ENDOCRINE A:   DM.   P:   SSI.  NEUROLOGIC A:   Hx Depression. P:   Hold outpatient gabapentin.  Family updated: Son by EDP.  I waited for his arrival at [facility] but had to leave to tend to other patients at [facility] before he arrived.  Interdisciplinary Family Meeting v Palliative Care Meeting:  Due by: 10/23.  CC time:  40 minutes.   Montey Hora, [place] [facility] Pager: [phone]  or [phone] 12/21/2014, 9:20 PM

## 2014-12-23 NOTE — Progress Notes (Signed)
[place] Progress Note Patient Name: Kathryn [name] DOB: April 18, 1933 MRN: [id number]   Date of Service  01/02/2015  HPI/Events of Note  Labile BP & unreliable cuff given body habitus per RN.  eICU Interventions  art     Intervention Category Major Interventions: Hypotension - evaluation and management  Tera Partridge 01/05/2015, 11:37 PM

## 2014-12-23 NOTE — Progress Notes (Signed)
[place] Progress Note Patient Name: WINNIFRED DUFFORD DOB: [birth year] MRN: [id number]   Date of Service  12/09/2014  HPI/Events of Note  RN notified of elevated LA  eICU Interventions  Trending downward. Weaning vasopressors.     Intervention Category Major Interventions: Sepsis - evaluation and management  Tera Partridge 12/12/2014, 11:23 PM

## 2014-12-23 NOTE — ED Notes (Signed)
Dr Regenia Skeeter made aware of abnormal lactic acid.

## 2014-12-23 NOTE — ED Notes (Signed)
Per EMS- Patient is a resident of [facility]. Staff reported that they were unable to place a foley due to vaginal swelling. Patient has not voided today. Staff at [facility] started an IV in the left upper thigh. NS at [facility] waqs infusing.

## 2014-12-23 NOTE — Progress Notes (Signed)
Patient noted to have been admitted and discharged from the hospital from 09/28 to 10/07 for Sepsis.  Pcp listed is Dr. Bubba Camp.  Patient is a resident at [facility].  Patient presenting to ED this evening for urinary retention and vaginal swelling.  Lactic acid 8.29, B/P 78/42, creat 2.20.

## 2014-12-23 NOTE — ED Notes (Signed)
Bed: GE72 Expected date:  Expected time:  Means of arrival:  Comments: EMS- 81yo F, urinary retention

## 2014-12-23 NOTE — ED Notes (Signed)
Attempted to place temp foley with Asja RN, meatus visualized, catheter inserted, but unable to inflate baloon.  Family states urology usually has to place her foley.  Foley removed, blood noted around meatus from previous attempt.

## 2014-12-23 NOTE — ED Notes (Signed)
Dr Regenia Skeeter made aware of abnormal troponin.

## 2014-12-24 ENCOUNTER — Ambulatory Visit: Payer: Medicare Other | Admitting: Physician Assistant

## 2014-12-24 ENCOUNTER — Inpatient Hospital Stay (HOSPITAL_COMMUNITY): Payer: Medicare Other | Admitting: Certified Registered"

## 2014-12-24 ENCOUNTER — Inpatient Hospital Stay (HOSPITAL_COMMUNITY): Payer: Medicare Other

## 2014-12-24 DIAGNOSIS — G9340 Encephalopathy, unspecified: Secondary | ICD-10-CM

## 2014-12-24 DIAGNOSIS — A419 Sepsis, unspecified organism: Secondary | ICD-10-CM

## 2014-12-24 DIAGNOSIS — Z2239 Carrier of other specified bacterial diseases: Secondary | ICD-10-CM

## 2014-12-24 DIAGNOSIS — R6521 Severe sepsis with septic shock: Secondary | ICD-10-CM

## 2014-12-24 DIAGNOSIS — N390 Urinary tract infection, site not specified: Secondary | ICD-10-CM

## 2014-12-24 DIAGNOSIS — I4891 Unspecified atrial fibrillation: Secondary | ICD-10-CM

## 2014-12-24 LAB — TROPONIN I: TROPONIN I: 1.61 ng/mL — AB (ref <0.031)

## 2014-12-24 LAB — CBC
HEMATOCRIT: 28 % — AB (ref 36.0–46.0)
HEMOGLOBIN: 9.2 g/dL — AB (ref 12.0–15.0)
MCH: 33.6 pg (ref 26.0–34.0)
MCHC: 32.9 g/dL (ref 30.0–36.0)
MCV: 102.2 fL — ABNORMAL HIGH (ref 78.0–100.0)
Platelets: 181 10*3/uL (ref 150–400)
RBC: 2.74 MIL/uL — ABNORMAL LOW (ref 3.87–5.11)
RDW: 14.4 % (ref 11.5–15.5)
WBC: 33.6 10*3/uL — ABNORMAL HIGH (ref 4.0–10.5)

## 2014-12-24 LAB — GLUCOSE, CAPILLARY
GLUCOSE-CAPILLARY: 103 mg/dL — AB (ref 65–99)
GLUCOSE-CAPILLARY: 138 mg/dL — AB (ref 65–99)
GLUCOSE-CAPILLARY: 174 mg/dL — AB (ref 65–99)
GLUCOSE-CAPILLARY: 234 mg/dL — AB (ref 65–99)
Glucose-Capillary: 148 mg/dL — ABNORMAL HIGH (ref 65–99)
Glucose-Capillary: 216 mg/dL — ABNORMAL HIGH (ref 65–99)

## 2014-12-24 LAB — BASIC METABOLIC PANEL
ANION GAP: 12 (ref 5–15)
BUN: 32 mg/dL — ABNORMAL HIGH (ref 6–20)
CHLORIDE: 107 mmol/L (ref 101–111)
CO2: 16 mmol/L — AB (ref 22–32)
CREATININE: 2.35 mg/dL — AB (ref 0.44–1.00)
Calcium: 7.3 mg/dL — ABNORMAL LOW (ref 8.9–10.3)
GFR calc non Af Amer: 19 mL/min — ABNORMAL LOW (ref >60)
GFR, EST AFRICAN AMERICAN: 22 mL/min — AB (ref >60)
Glucose, Bld: 248 mg/dL — ABNORMAL HIGH (ref 65–99)
Potassium: 3.2 mmol/L — ABNORMAL LOW (ref 3.5–5.1)
Sodium: 135 mmol/L (ref 135–145)

## 2014-12-24 LAB — URINALYSIS, ROUTINE W REFLEX MICROSCOPIC
Bilirubin Urine: NEGATIVE
GLUCOSE, UA: NEGATIVE mg/dL
KETONES UR: NEGATIVE mg/dL
Nitrite: NEGATIVE
PH: 7 (ref 5.0–8.0)
Specific Gravity, Urine: 1.015 (ref 1.005–1.030)
UROBILINOGEN UA: 0.2 mg/dL (ref 0.0–1.0)

## 2014-12-24 LAB — PROCALCITONIN: Procalcitonin: 76.88 ng/mL

## 2014-12-24 LAB — MAGNESIUM: Magnesium: 1 mg/dL — ABNORMAL LOW (ref 1.7–2.4)

## 2014-12-24 LAB — LACTIC ACID, PLASMA
LACTIC ACID, VENOUS: 7.9 mmol/L — AB (ref 0.5–2.0)
Lactic Acid, Venous: 5.7 mmol/L (ref 0.5–2.0)

## 2014-12-24 LAB — CORTISOL: Cortisol, Plasma: >100 ug/dL

## 2014-12-24 LAB — PHOSPHORUS: PHOSPHORUS: 3 mg/dL (ref 2.5–4.6)

## 2014-12-24 LAB — URINE MICROSCOPIC-ADD ON

## 2014-12-24 MED ORDER — FENTANYL CITRATE (PF) 100 MCG/2ML IJ SOLN
INTRAMUSCULAR | Status: AC
  Filled 2014-12-24: qty 2

## 2014-12-24 MED ORDER — MAGNESIUM SULFATE 2 GM/50ML IV SOLN
2.0000 g | Freq: Once | INTRAVENOUS | Status: AC
  Administered 2014-12-24: 2 g via INTRAVENOUS
  Filled 2014-12-24: qty 50

## 2014-12-24 MED ORDER — FENTANYL CITRATE (PF) 100 MCG/2ML IJ SOLN
12.5000 ug | INTRAMUSCULAR | Status: DC | PRN
  Administered 2014-12-24 – 2014-12-26 (×20): 25 ug via INTRAVENOUS
  Filled 2014-12-24 (×20): qty 2

## 2014-12-24 MED ORDER — HYDROCORTISONE NA SUCCINATE PF 100 MG IJ SOLR
50.0000 mg | Freq: Four times a day (QID) | INTRAMUSCULAR | Status: DC
  Administered 2014-12-24 – 2014-12-27 (×12): 50 mg via INTRAVENOUS
  Filled 2014-12-24 (×12): qty 2

## 2014-12-24 MED ORDER — FENTANYL CITRATE (PF) 100 MCG/2ML IJ SOLN
12.5000 ug | INTRAMUSCULAR | Status: DC | PRN
Start: 2014-12-24 — End: 2014-12-24
  Administered 2014-12-24: 25 ug via INTRAVENOUS
  Administered 2014-12-24 (×3): 12.5 ug via INTRAVENOUS
  Filled 2014-12-24 (×4): qty 2

## 2014-12-24 MED ORDER — VASOPRESSIN 20 UNIT/ML IV SOLN
0.0100 [IU]/min | INTRAVENOUS | Status: DC
  Administered 2014-12-24: 0.04 [IU]/min via INTRAVENOUS
  Administered 2014-12-24 – 2014-12-25 (×2): 0.03 [IU]/min via INTRAVENOUS
  Administered 2014-12-25 – 2014-12-26 (×2): 0.04 [IU]/min via INTRAVENOUS
  Filled 2014-12-24 (×4): qty 2

## 2014-12-24 MED ORDER — AMIODARONE LOAD VIA INFUSION
150.0000 mg | Freq: Once | INTRAVENOUS | Status: AC
  Administered 2014-12-24: 150 mg via INTRAVENOUS
  Filled 2014-12-24: qty 83.34

## 2014-12-24 MED ORDER — NOREPINEPHRINE BITARTRATE 1 MG/ML IV SOLN
2.0000 ug/min | INTRAVENOUS | Status: DC
  Administered 2014-12-24 (×5): 80 ug/min via INTRAVENOUS
  Administered 2014-12-24 – 2014-12-25 (×2): 50 ug/min via INTRAVENOUS
  Administered 2014-12-25: 62 ug/min via INTRAVENOUS
  Administered 2014-12-25: 40 ug/min via INTRAVENOUS
  Administered 2014-12-27: 30 ug/min via INTRAVENOUS
  Filled 2014-12-24 (×13): qty 16

## 2014-12-24 MED ORDER — AMIODARONE HCL IN DEXTROSE 360-4.14 MG/200ML-% IV SOLN
60.0000 mg/h | INTRAVENOUS | Status: AC
  Administered 2014-12-24: 60 mg/h via INTRAVENOUS
  Filled 2014-12-24: qty 200

## 2014-12-24 MED ORDER — SODIUM CHLORIDE 0.9 % IV BOLUS (SEPSIS)
500.0000 mL | Freq: Once | INTRAVENOUS | Status: AC
  Administered 2014-12-24: 500 mL via INTRAVENOUS

## 2014-12-24 MED ORDER — SODIUM CHLORIDE 0.9 % IV BOLUS (SEPSIS)
1000.0000 mL | Freq: Once | INTRAVENOUS | Status: AC
  Administered 2014-12-24: 1000 mL via INTRAVENOUS

## 2014-12-24 MED ORDER — CHLORHEXIDINE GLUCONATE 0.12 % MT SOLN
15.0000 mL | Freq: Two times a day (BID) | OROMUCOSAL | Status: DC
  Administered 2014-12-24 – 2014-12-27 (×8): 15 mL via OROMUCOSAL
  Filled 2014-12-24 (×5): qty 15

## 2014-12-24 MED ORDER — METOPROLOL TARTRATE 25 MG PO TABS: 25.0000 mg | ORAL_TABLET | Freq: Two times a day (BID) | ORAL | Status: DC

## 2014-12-24 MED ORDER — AMIODARONE HCL IN DEXTROSE 360-4.14 MG/200ML-% IV SOLN
30.0000 mg/h | INTRAVENOUS | Status: DC
  Administered 2014-12-25 – 2014-12-27 (×5): 30 mg/h via INTRAVENOUS
  Filled 2014-12-24 (×6): qty 200

## 2014-12-24 MED ORDER — ONDANSETRON HCL 4 MG/2ML IJ SOLN
4.0000 mg | Freq: Three times a day (TID) | INTRAMUSCULAR | Status: DC
  Administered 2014-12-24 – 2014-12-27 (×9): 4 mg via INTRAVENOUS
  Filled 2014-12-24 (×9): qty 2

## 2014-12-24 MED ORDER — CETYLPYRIDINIUM CHLORIDE 0.05 % MT LIQD
7.0000 mL | Freq: Two times a day (BID) | OROMUCOSAL | Status: DC
  Administered 2014-12-24 – 2014-12-27 (×6): 7 mL via OROMUCOSAL

## 2014-12-24 NOTE — Progress Notes (Signed)
eLink Physician-Brief Progress Note Patient Name: Kathryn [name] DOB: February 06, 1934 MRN: [id number]   Date of Service  12/24/2014  HPI/Events of Note  AFIB with RVR. Ventricular rate = 120's to 130's. Not able to take PO well yet. BP = 103/59.  eICU Interventions  [name] order: 1. Amiodarone IV bolus and infusion.      Intervention Category Major Interventions: Arrhythmia - evaluation and management  Sommer,Steven Eugene 12/24/2014, 4:22 PM

## 2014-12-24 NOTE — Consult Note (Addendum)
Consult: Difficult Foley, decreased urine output Requested by: Dr. Regenia Skeeter  Chief Complaint: difficult foley; decreased UOP  History of Present Illness: Patient admitted with sepsis and decreased urine output. She is a patient of Dr. Risa Grill and has a known small contracted bladder. At one point he maintained a Foley catheter simply to improve her incontinence but this was discontinued in 2013. In the emergency department by report a bladder scan showed a minimal PVR and a Foley catheter was placed but resistance was met when the balloon was inflated.  The patient was admitted a few weeks ago with sepsis and an ESBL Escherichia coli in the urine. She had multiple imaging studies including MRI of the abdomen, ultrasound and CT scan which revealed small atrophic kidneys without hydronephrosis and a small contracted bladder. I reviewed all the images.  Her creatinine ranges from 1.8-2.7 and was 2.46 on admission but she has had fever tachycardia and hypotension. Currently, she is on pressors and IV fluids and it would be helpful if her urine output was recorded.  Past Medical History  Diagnosis Date  . Osteoporosis   . HTN (hypertension)     unspecified  . Atrial fibrillation ([place])   . Macular degeneration   . Osteoarthritis   . Edema   . Anemia   . Hydronephrosis     left  . UTI (lower urinary tract infection)   . Pressure ulcer of back   . DM (diabetes mellitus) ([place])     lantus  . Depression   . Insomnia   . GERD (gastroesophageal reflux disease)   . CKD (chronic kidney disease), stage III   . Chronic diastolic CHF (congestive heart failure) [facility])    Past Surgical History  Procedure Laterality Date  . Replacement total knee  08/2007    bilateral  . Appendectomy    . Cholecystectomy    . Colonoscopy    . Upper gastrointestinal endoscopy    . Cystoscopy w/ ureteral stent placement  04/29/2011    Procedure: CYSTOSCOPY WITH RETROGRADE PYELOGRAM/URETERAL STENT PLACEMENT;  Surgeon:  Bernestine Amass, MD;  Location: [facility];  Service: Urology;  Laterality: Bilateral;  bilateral retrograde, double j stent. Cystogram   . Ureteroscopy  04/29/2011    Procedure: URETEROSCOPY;  Surgeon: Bernestine Amass, MD;  Location: [facility];  Service: Urology;  Laterality: Right;  . Cystoscopy with ureteroscopy  05/31/2011    Procedure: CYSTOSCOPY WITH URETEROSCOPY;  Surgeon: Bernestine Amass, MD;  Location: [facility];  Service: Urology;  Laterality: Right;  Ureteral biopsy Right    . Ercp N/A 12/07/2014    Procedure: ENDOSCOPIC RETROGRADE CHOLANGIOPANCREATOGRAPHY (ERCP);  Surgeon: Inda Castle, MD;  Location: [facility];  Service: Endoscopy;  Laterality: N/A;    Home Medications:  Prescriptions prior to admission  Medication Sig Dispense Refill Last Dose  . amLODipine (NORVASC) 10 MG tablet Take 10 mg by mouth daily.   over 48 hours at Unknown time  . Calcium Carbonate-Vitamin D 600-400 MG-UNIT per tablet Take 1 tablet by mouth 2 (two) times daily.    over 48 hours at Unknown time  . Cholecalciferol (VITAMIN D3) 2000 UNITS TABS Take 1 tablet by mouth daily.    over 48 hours at Unknown time  . feeding supplement, GLUCERNA SHAKE, (GLUCERNA SHAKE) LIQD Take 237 mLs by mouth 2 (two) times daily between meals.   over 48 hours at Unknown time  . furosemide (LASIX) 20 MG tablet Take 2 tablets (40 mg total) by mouth 2 (two)  times daily.   over 48 hours at Unknown time  . gabapentin (NEURONTIN) 300 MG capsule Take 300 mg by mouth at bedtime.    over 48 hours at Unknown time  . HYDROcodone-acetaminophen (NORCO/VICODIN) 5-325 MG tablet Take 1 tablet by mouth every 6 (six) hours as needed for severe pain.    Past Week at Unknown time  . insulin lispro (HUMALOG) 100 UNIT/ML cartridge Inject 5 Units into the skin 3 (three) times daily as needed (blood sugar). Check blood sugar three times daily with meals if CBG> 200 inject 5 units subcutaneously   unknown at Unknown time  . LANTUS 100 UNIT/ML injection Inject 15 Units  into the skin daily.    over 48 hours at Unknown time  . metoprolol tartrate (LOPRESSOR) 25 MG tablet Take 1 tablet (25 mg total) by mouth 2 (two) times daily.   over 48 hours at unknown time  . Multiple Vitamins-Minerals (DECUBI-VITE) CAPS Take 1 capsule by mouth daily.    over 48 hours at unknown time  . pantoprazole (PROTONIX) 40 MG tablet Take 1 tablet (40 mg total) by mouth daily at 12 noon.   over 48 hours at Unknown time  . potassium chloride SA (K-DUR,KLOR-CON) 20 MEQ tablet Take 2 tablets (40 mEq total) by mouth daily.   over 48 hours at Unknown time  . Protein (PROCEL) POWD Take 1 scoop by mouth 2 (two) times daily.    over 48 hours at unknown time  . saccharomyces boulardii (FLORASTOR) 250 MG capsule Take 1 capsule (250 mg total) by mouth 2 (two) times daily.   over 48 hours at Unknown time  . simethicone (MYLICON) 80 MG chewable tablet Chew 160 mg by mouth 3 (three) times daily. For gas pains.   over 48 hours at unknown time  . AMBULATORY NON FORMULARY MEDICATION Medication Name: Med Pass 240 mL with water three times daily for hydration   unknown at unknown time  . hydrOXYzine (ATARAX/VISTARIL) 25 MG tablet Take 25 mg by mouth every 8 (eight) hours as needed for itching.   unknown at Unknown time  . ipratropium-albuterol (DUONEB) 0.5-2.5 (3) MG/3ML SOLN Take 3 mLs by nebulization. Administer 3 ml via nebulizer four times a day as needed for cough and wheezing   unknown at unknown time  . ondansetron (ZOFRAN) 4 MG tablet Take 4 mg by mouth every 8 (eight) hours as needed for nausea or vomiting.   over 48 hours at unknown time  . polyethylene glycol (MIRALAX / GLYCOLAX) packet Take 17 g by mouth daily as needed for moderate constipation. For constipation   unknown at unknown time   Allergies:  Allergies  Allergen Reactions  . Celebrex [Celecoxib]   . Nsaids   . Sulfa Antibiotics     Family History  Problem Relation Age of Onset  . Diabetes Father   . Atrial fibrillation Father    . Breast cancer Paternal Grandmother   . Heart disease Mother    Social History:  reports that she has never smoked. She has never used smokeless tobacco. She reports that she does not drink alcohol or use illicit drugs.  ROS: A complete review of systems was performed.  All systems are negative except for pertinent findings as noted. Review of Systems  All other systems reviewed and are negative.    Physical Exam:  Vital signs in last 24 hours: Temp:  [100 F (37.8 C)-101.8 F (38.8 C)] 100 F (37.8 C) (10/17 2258) Pulse Rate:  [95-145]  95 (10/17 2205) Resp:  [18-27] 18 (10/17 2215) BP: (46-99)/(20-54) 99/50 mmHg (10/17 2215) SpO2:  [88 %-100 %] 94 % (10/17 2324) Weight:  [107.049 kg (236 lb)-114.3 kg (251 lb 15.8 oz)] 114.3 kg (251 lb 15.8 oz) (10/17 2258) General:  No acute distress, appropriately answers some questions HEENT: Normocephalic, atraumatic Neck: No JVD or lymphadenopathy Cardiovascular: Regular rate and rhythm Lungs: Regular rate and effort Abdomen: Soft, nontender, nondistended, no abdominal masses Back: No CVA tenderness Extremities: No edema Neurologic: Grossly intact GU/Foley placement: Her nurse and another nurse assisted and served a chaperones. There was vaginal atrophy and retraction of the meatus, but with one finger in the vagina I prepped the meatus and slid a 14 French catheter and over my finger into the bladder. It drained some dark urine. The Foley was inflated to 5 mL and left gravity drainage. It did not actively irrigate, but passively irrigated. Whatever fluid I put in, drained out by gravity drainage with equal return. Also, on anterior vaginal wall palpation I could feel the mobile balloon at the bladder neck.    Laboratory Data:  Results for orders placed or performed during the hospital encounter of 12/14/2014 (from the past 24 hour(s))  Comprehensive metabolic panel     Status: Abnormal   Collection Time: 12/30/2014  6:52 PM  Result Value  Ref Range   Sodium 138 135 - 145 mmol/L   Potassium 3.5 3.5 - 5.1 mmol/L   Chloride 100 (L) 101 - 111 mmol/L   CO2 19 (L) 22 - 32 mmol/L   Glucose, Bld 153 (H) 65 - 99 mg/dL   BUN 33 (H) 6 - 20 mg/dL   Creatinine, Ser 2.46 (H) 0.44 - 1.00 mg/dL   Calcium 8.3 (L) 8.9 - 10.3 mg/dL   Total Protein 5.3 (L) 6.5 - 8.1 g/dL   Albumin 2.3 (L) 3.5 - 5.0 g/dL   AST 63 (H) 15 - 41 U/L   ALT 37 14 - 54 U/L   Alkaline Phosphatase 269 (H) 38 - 126 U/L   Total Bilirubin 1.6 (H) 0.3 - 1.2 mg/dL   GFR calc non Af Amer 18 (L) >60 mL/min   GFR calc Af Amer 20 (L) >60 mL/min   Anion gap 19 (H) 5 - 15  CBC WITH DIFFERENTIAL     Status: Abnormal   Collection Time: 01/03/2015  6:52 PM  Result Value Ref Range   WBC 4.2 4.0 - 10.5 K/uL   RBC 2.99 (L) 3.87 - 5.11 MIL/uL   Hemoglobin 9.9 (L) 12.0 - 15.0 g/dL   HCT 30.5 (L) 36.0 - 46.0 %   MCV 102.0 (H) 78.0 - 100.0 fL   MCH 33.1 26.0 - 34.0 pg   MCHC 32.5 30.0 - 36.0 g/dL   RDW 13.8 11.5 - 15.5 %   Platelets 163 150 - 400 K/uL   Neutrophils Relative % 95 %   Lymphocytes Relative 3 %   Monocytes Relative 2 %   Eosinophils Relative 0 %   Basophils Relative 0 %   Neutro Abs 4.0 1.7 - 7.7 K/uL   Lymphs Abs 0.1 (L) 0.7 - 4.0 K/uL   Monocytes Absolute 0.1 0.1 - 1.0 K/uL   Eosinophils Absolute 0.0 0.0 - 0.7 K/uL   Basophils Absolute 0.0 0.0 - 0.1 K/uL   RBC Morphology POLYCHROMASIA PRESENT    WBC Morphology INCREASED BANDS (>20% BANDS)   Blood Culture (routine x 2)     Status: None (Preliminary result)   Collection Time:  12/13/2014  6:59 PM  Result Value Ref Range   Specimen Description BLOOD RIGHT ANTECUBITAL    Special Requests      BOTTLES DRAWN AEROBIC AND ANAEROBIC 10CC AER,5CC ANA Performed at [facility]    Culture PENDING    Report Status PENDING   I-Stat CG4 Lactic Acid, ED  (not at  [facility])     Status: Abnormal   Collection Time: 12/30/2014  7:04 PM  Result Value Ref Range   Lactic Acid, Venous 8.29 (HH) 0.5 - 2.0 mmol/L   Comment  NOTIFIED PHYSICIAN   I-stat chem 8, ed     Status: Abnormal   Collection Time: 12/09/2014  7:04 PM  Result Value Ref Range   Sodium 136 135 - 145 mmol/L   Potassium 3.5 3.5 - 5.1 mmol/L   Chloride 98 (L) 101 - 111 mmol/L   BUN 32 (H) 6 - 20 mg/dL   Creatinine, Ser 2.20 (H) 0.44 - 1.00 mg/dL   Glucose, Bld 146 (H) 65 - 99 mg/dL   Calcium, Ion 1.07 (L) 1.13 - 1.30 mmol/L   TCO2 18 0 - 100 mmol/L   Hemoglobin 10.5 (L) 12.0 - 15.0 g/dL   HCT 31.0 (L) 36.0 - 46.0 %  I-stat troponin, ED (not at [facility], [facility])     Status: Abnormal   Collection Time: 01/01/2015  7:12 PM  Result Value Ref Range   Troponin i, poc 0.11 (HH) 0.00 - 0.08 ng/mL   Comment NOTIFIED PHYSICIAN    Comment 3          Lactic acid, plasma     Status: Abnormal   Collection Time: 01/05/2015 10:30 PM  Result Value Ref Range   Lactic Acid, Venous 7.5 (HH) 0.5 - 2.0 mmol/L  Troponin I     Status: Abnormal   Collection Time: 01/06/2015 10:30 PM  Result Value Ref Range   Troponin I 0.44 (H) <0.031 ng/mL  Procalcitonin - Baseline     Status: None   Collection Time: 12/28/2014 10:30 PM  Result Value Ref Range   Procalcitonin 31.60 ng/mL   Recent Results (from the past 240 hour(s))  Blood Culture (routine x 2)     Status: None (Preliminary result)   Collection Time: 12/30/2014  6:59 PM  Result Value Ref Range Status   Specimen Description BLOOD RIGHT ANTECUBITAL  Final   Special Requests   Final    BOTTLES DRAWN AEROBIC AND ANAEROBIC 10CC AER,5CC ANA Performed at [facility]    Culture PENDING  Incomplete   Report Status PENDING  Incomplete   Creatinine:  Recent Labs  12/07/2014 1852 12/22/2014 1904  CREATININE 2.46* 2.20*    Impression/Assessment/plan: Decreased urine output-this is not likely related to urinary retention but to sepsis and chronic renal insufficiency. Hopefully Foley will drain and provide accurate urine output.  Difficult Foley-due to a small contracted bladder. A 14 French catheter with 5 mL in the  balloon is in place.  Chronic renal insufficiency-again multiple recent studies have shown no hydronephrosis.  I will notify Dr. Risa Grill of admission.   Kathryn [name] 12/24/2014, 12:05 AM

## 2014-12-24 NOTE — Progress Notes (Signed)
[facility] Progress Note Patient Name: Kathryn [name] DOB: Dec 15, 1933 MRN: [id number]   Date of Service  12/24/2014  HPI/Events of Note  C/o total body pain not relived by Fentanyl IV Q 2 hours.   eICU Interventions  Will order: 1. Increase Fentanyl to 12.5 - 25 mcg IV Q 1 hour PRN. Hold for SBP < 95.     Intervention Category Intermediate Interventions: Pain - evaluation and management  Ludmila Ebarb Eugene 12/24/2014, 5:49 PM

## 2014-12-24 NOTE — Progress Notes (Signed)
[facility] Progress Note Patient Name: Kathryn [name] DOB: 02-Aug-1933 MRN: [id number]   Date of Service  12/24/2014  HPI/Events of Note  RN notified of ongoing hypotension/shock on levo gtt at 62. CVP 13.  eICU Interventions  Increase levo gtt to max 80. Start Vasopressin gtt.     Intervention Category Major Interventions: Shock - evaluation and management  Tera Partridge 12/24/2014, 1:52 AM

## 2014-12-24 NOTE — Progress Notes (Signed)
S:  Called by RN regarding marginal BP despite 2 vasopressors, lactic acid of 5.4, low mag and hypokalemia.  Also, pt with increased HR to 140's.  No response to pain meds.    O: Blood pressure 109/76, pulse 112, temperature 100 F (37.8 C), temperature source Axillary, resp. rate 25, height 5\' 10"  (1.778 m), weight 251 lb 15.8 oz (114.3 kg), SpO2 97 %.  Lactic Acid, Venous 10/17 2230 7.5  10/18 0035 7.9 10/18 0545 5.7   A: Hypokalemia Hypomagnesemia  Lactic Acidosis    P: Replace magnesium, 2gm Monitor K, Mag Trend lactic acid 500 ml NS bolus  Kathryn Gens, NP-C [facility] Pgr: [phone] or if no answer [phone] 12/24/2014, 2:49 PM

## 2014-12-24 NOTE — Progress Notes (Signed)
PULMONARY / CRITICAL CARE MEDICINE   Name: Kathryn [name] MRN: [id number] DOB: [birth year]    ADMISSION DATE:  01/04/2015 CONSULTATION DATE:  01/01/2015  REFERRING MD :  EDP  CHIEF COMPLAINT:  Hypotension  INITIAL PRESENTATION:  81 y.o. F from nursing home, [facility] ED 10/17 after staff unable to place foley due to vaginal swelling.  In ED, found to be in shock, PCCM called for admission.    STUDIES:  CXR 12/29/2014 >>> bibasilar atx, chronic elevation of right hemidiaphragm.  SIGNIFICANT EVENTS: 10/17 - admitted with shock, septic vs hypovolemic.   SUBJECTIVE: Awake, oriented, still quite ill, thirsty  VITAL SIGNS: Temp:  [98.2 F (36.8 C)-101.8 F (38.8 C)] 99.8 F (37.7 C) (10/18 0800) Pulse Rate:  [78-145] 100 (10/18 0900) Resp:  [13-33] 15 (10/18 0900) BP: (41-217)/(20-124) 100/51 mmHg (10/18 0900) SpO2:  [88 %-100 %] 98 % (10/18 0900) Arterial Line BP: (58-101)/(31-54) 101/46 mmHg (10/18 0900) Weight:  [236 lb (107.049 kg)-251 lb 15.8 oz (114.3 kg)] 251 lb 15.8 oz (114.3 kg) (10/17 2258) HEMODYNAMICS: CVP:  [5 mmHg-13 mmHg] 5 mmHg VENTILATOR SETTINGS:   INTAKE / OUTPUT: Intake/Output      10/17 0701 - 10/18 0700 10/18 0701 - 10/19 0700   I.V. (mL/kg) 7842 (68.6) 250 (2.2)   IV Piggyback 210 100   Total Intake(mL/kg) 8052 (70.4) 350 (3.1)   Urine (mL/kg/hr) 55    Stool 0    Total Output 55     Net +7997 +350        Stool Occurrence 0 x      PHYSICAL EXAMINATION: General: Chronically ill appearing female, comfortable in bed HENT: NCAT, mucus membranes dry PULM: normal respiratory effort, clear to auscultation CV: Tachy, irregular, no murmur, well perfused extremities GI: BS+, soft, nontender Derm: decreased skin turgor, some chronic appearing edema legs Neuro: awake, alert, oriented to [place] hospital, situation  LABS:  CBC  Recent Labs Lab 12/24/2014 1852 12/12/2014 1904 12/24/14 0545  WBC 4.2  --  33.6*  HGB 9.9* 10.5* 9.2*  HCT 30.5*  31.0* 28.0*  PLT 163  --  181   Coag's No results for input(s): APTT, INR in the last 168 hours. BMET  Recent Labs Lab 12/29/2014 1852 12/26/2014 1904 12/24/14 0545  NA 138 136 135  K 3.5 3.5 3.2*  CL 100* 98* 107  CO2 19*  --  16*  BUN 33* 32* 32*  CREATININE 2.46* 2.20* 2.35*  GLUCOSE 153* 146* 248*   Electrolytes  Recent Labs Lab 12/10/2014 1852 12/24/14 0545  CALCIUM 8.3* 7.3*  MG  --  1.0*  PHOS  --  3.0   Sepsis Markers  Recent Labs Lab 12/30/2014 2230 12/24/14 0035 12/24/14 0545  LATICACIDVEN 7.5* 7.9* 5.7*  PROCALCITON 31.60  --  76.88   ABG No results for input(s): PHART, PCO2ART, PO2ART in the last 168 hours. Liver Enzymes  Recent Labs Lab 12/17/2014 1852  AST 63*  ALT 37  ALKPHOS 269*  BILITOT 1.6*  ALBUMIN 2.3*   Cardiac Enzymes  Recent Labs Lab 12/22/2014 2230 12/24/14 0545  TROPONINI 0.44* 1.61*   Glucose  Recent Labs Lab 12/24/14 0039 12/24/14 0437 12/24/14 0758  GLUCAP 234* 216* 174*    Imaging  CXR images personally reviewed> R IJ CVL in place, bilateral mild scattered air space disease   ASSESSMENT / PLAN:  CARDIOVASCULAR CVL R IJ 10/17 >>> EKG 10/17 > Afib, non-specific ST wave changes lateral leads A:  Septic shock> still appearing  hypovolemic on my exam this morning Afib, not in RVR History of hypertension Demand ischemia due to sepsis DO NOT RESUSCITATE P:  Norepinephrine as needed to maintain goal MAP > 65 F/u repeat echo Monitor CVP Bolus saline now Add stress dose steroids Hold outpatient amlodipine, furosemide, lopressor  INFECTIOUS A:   Septic shock due to UTI, ddx includes skin infection Pressure ulcers of back Hx ESBL UTI P:   BCx2 10/17 > UCx 10/17 >  Abx: Vanc, start date 10/17, day 1/x Abx: Imipenem, start date 10/17, day 1/x  Wound care consult  PULMONARY A: Bibasilar atelectasis Chronically elevated right hemidiaphragm DO NOT INTUBATE P:   Incentive spirometry DuoNebs CXR  intermittently Monitor O2 saturation closely  RENAL A:   Chronic kidney disease, Cr appears at baseline but she is oliguric AG metabolic acidosis - lactate > improving Hypocalcemia P:   Bolus saline now NS @ 125 Monitor BMET and UOP Replace electrolytes as needed   GASTROINTESTINAL A:   GERD Nutrition Recent history of elevated LFTs, improving here; no belly pain on my exam to suggest biliary pathology S/p cholecystecomy P:   Pantoprazole Advance diet to clears Repeat LFT in AM  HEMATOLOGIC A:   Anemia of chronic disease VTE Prophylaxis P:  Transfuse for Hgb < 7 SCD's / Heparin CBC in AM  ENDOCRINE A:   DM 2 P:   SSI  NEUROLOGIC A:   Hx Depression P:   Hold outpatient gabapentin  Family updated:  I updated her son this morning by phone  Interdisciplinary Family Meeting v Palliative Care Meeting:  Due by: 10/23.  CC time:  40 minutes.   Roselie Awkward, MD [place] [facility] Pager: [phone] Cell: [phone] After 3pm or if no response, call [phone]   12/24/2014, 9:52 AM

## 2014-12-24 NOTE — Progress Notes (Signed)
CRITICAL VALUE ALERT  Critical value received:  Anaerobic gram negative rods in blood culture   Date of notification:  12/24/14  Time of notification:  0850  Critical value read back:Yes.    Nurse who received alert:  Norberta Keens, RN   MD notified (1st page):  B. Alfredo Martinez, NP PCCM  Time of first page:  [phone]  MD notified (2nd page):  Time of second page:  Responding MD:  B. Alfredo Martinez, NP PCCM  Time MD responded:  [phone]

## 2014-12-24 NOTE — Progress Notes (Signed)
CSW consulted to assist with d/c planning. Pt is a long term care resident from [facility]. CSW attempted to meet with pt this am. Pt was sleeping / No family at bedside. SNF has contacted CSW and reported they will readmit pt at d/c. CSW will confirm d/c plan with pt / family.   Werner Lean LCSW [phone]

## 2014-12-24 NOTE — Progress Notes (Signed)
*  PRELIMINARY RESULTS* Echocardiogram 2D Echocardiogram has been performed.  Leavy Cella 12/24/2014, 11:36 AM

## 2014-12-24 NOTE — Progress Notes (Signed)
CRITICAL VALUE ALERT  Critical value received:  Troponin 1.61, Lactate 5.7  Date of notification:  12/24/14  Time of notification:0650  Critical value read back:Yes.    Nurse who received alert:  Jari Favre RN  MD notified (1st page):  Dr. Milinda Hirschfeld  Time of first page:  [phone]  MD notified (2nd page):  Time of second page:  Responding MD:  Awaiting response  Time MD responded:  Awaiting response

## 2014-12-24 NOTE — Care Management Note (Signed)
Case Management Note  Patient Details  Name: Kathryn [name] MRN: [id number] Date of Birth: 09/08/1933  Subjective/Objective:          urospesis and positive bld cultures          Action/Plan:Date: December 24, 2014 Chart reviewed for concurrent status and case management needs. Will continue to follow patient for changes and needs: Velva Harman, RN, BSN, [place]   [phone]  Expected Discharge Date:   (unknown)               Expected Discharge Plan:  Skilled Nursing Facility  In-House Referral:  Clinical Social Work  Discharge planning Services  CM Consult  Post Acute Care Choice:  NA Choice offered to:  NA  DME Arranged:    DME Agency:     HH Arranged:    [place] Agency:     Status of Service:  In process, will continue to follow  Medicare Important Message Given:    Date Medicare IM Given:    Medicare IM give by:    Date Additional Medicare IM Given:    Additional Medicare Important Message give by:     If discussed at [place] of Stay Meetings, dates discussed:    Additional Comments:  [name], RN 12/24/2014, 11:27 AM

## 2014-12-24 NOTE — Consult Note (Signed)
WOC wound consult note Reason for Consult:Moisture associated skin damage (MASD), specifically Incontinence associated skin damage (IAD) Wound type: MASD Pressure Ulcer POA: No Measurement: 20cm x 8cm with no open areas in the intragluteal cleft Wound bed:no open wounds Drainage (amount, consistency, odor) None Periwound: clean, clear, dry Dressing procedure/placement/frequency: patient is on a therapeutic mattress with low air loss feature.  We will turn and reposition side to side, even to place a pillow beneath alternating hips per protocol.  She is wearing Prevalon Boots for heel pressure redistribution and the bilateral heels are intact despite the tips of her toes being blue hued and cool.  Goals of care are being discussed and preventive care measures are actively in place. [place] nursing team will not follow, but will remain available to this patient, the nursing and medical teams.  Please re-consult if needed. Thanks, Maudie Flakes, MSN, RN, [place], [place], [place] [phone])

## 2014-12-24 NOTE — Anesthesia Procedure Notes (Signed)
Anesthesia Procedure Note Called for assistance with a-line placement. Pt. ID'd, procedure explained to patient, verbal consent obtained. Right radial wrist prepped, 1% lido local skin wheal, with sterile technique, #20g cath intra-arterial tech with wire used for aline placement, 2nd attempt. Secured in place with aline dressing. Patient tol well. Left in care of RT.

## 2014-12-24 NOTE — Progress Notes (Signed)
Initial Nutrition Assessment  DOCUMENTATION CODES:   Obesity unspecified  INTERVENTION:  - Will monitor for diet advancement versus need for nutrition support - RD will continue to follow  NUTRITION DIAGNOSIS:   Inadequate oral intake related to inability to eat as evidenced by NPO status.  GOAL:   Patient will meet greater than or equal to 90% of their needs  MONITOR:   Diet advancement, Weight trends, Labs, Skin, I & O's  REASON FOR ASSESSMENT:   Malnutrition Screening Tool  ASSESSMENT:   81 y.o. F from nursing home, brought to [facility] ED 10/17 after staff unable to place foley due to vaginal swelling. In ED, found to be in shock, PCCM called for admission.   Pt seen for MST. BMI indicates obesity. Pt sleeping soundly at time of visit with no family/visitors present. Notes in chart indicate pt has been confused with encephalopathy.   She has been NPO since admission and unable to meet needs. Physical assessment of upper body shows no wasting; unable to assess lower body as bilateral boots in place. Per chart review, pt gained 25 lbs from 9/23-10/7 then lost these 25 lbs and subsequently gained 15 lbs from 10/14-10/17. Will monitor weight trends during admission; suspect fluid retention present.  Medications reviewed. Labs reviewed; CBGs: 174-234 mg/dL, K: 3.2 mmol/L, BUN/creatinine elevated, Ca: 7.3 mg/dL, GFR: 22, Mg: 1.0 mg/dL.   Diet Order:  Diet NPO time specified  Skin:  Wound (see comment) (Sacral pressure ulcer with no documented stage)  Last BM:  PTA  Height:   Ht Readings from Last 1 Encounters:  01/02/2015 5\' 10"  (1.778 m)    Weight:   Wt Readings from Last 1 Encounters:  12/10/2014 251 lb 15.8 oz (114.3 kg)    Ideal Body Weight:  68.18 kg (kg)  BMI:  Body mass index is 36.16 kg/(m^2).  Estimated Nutritional Needs:   Kcal:  1700-1900  Protein:  70-80 grams  Fluid:  1.8-2 L/day  EDUCATION NEEDS:   No education needs identified at this  time      Jarome Matin, RD, LDN Inpatient Clinical Dietitian Pager # [phone] After hours/weekend pager # [phone]

## 2014-12-25 ENCOUNTER — Inpatient Hospital Stay (HOSPITAL_COMMUNITY): Payer: Medicare Other

## 2014-12-25 DIAGNOSIS — N179 Acute kidney failure, unspecified: Secondary | ICD-10-CM

## 2014-12-25 LAB — LACTIC ACID, PLASMA: Lactic Acid, Venous: 2.4 mmol/L (ref 0.5–2.0)

## 2014-12-25 LAB — VANCOMYCIN, TROUGH: Vancomycin Tr: 18 ug/mL (ref 10.0–20.0)

## 2014-12-25 LAB — GLUCOSE, CAPILLARY
GLUCOSE-CAPILLARY: 152 mg/dL — AB (ref 65–99)
Glucose-Capillary: 157 mg/dL — ABNORMAL HIGH (ref 65–99)
Glucose-Capillary: 151 mg/dL — ABNORMAL HIGH (ref 65–99)
Glucose-Capillary: 145 mg/dL — ABNORMAL HIGH (ref 65–99)

## 2014-12-25 LAB — HEPATIC FUNCTION PANEL
ALT: 41 U/L (ref 14–54)
AST: 98 U/L — AB (ref 15–41)
Albumin: 1.8 g/dL — ABNORMAL LOW (ref 3.5–5.0)
Alkaline Phosphatase: 218 U/L — ABNORMAL HIGH (ref 38–126)
BILIRUBIN DIRECT: 0.7 mg/dL — AB (ref 0.1–0.5)
BILIRUBIN INDIRECT: 0.7 mg/dL (ref 0.3–0.9)
BILIRUBIN TOTAL: 1.4 mg/dL — AB (ref 0.3–1.2)
Total Protein: 4.3 g/dL — ABNORMAL LOW (ref 6.5–8.1)

## 2014-12-25 LAB — BASIC METABOLIC PANEL
ANION GAP: 12 (ref 5–15)
BUN: 33 mg/dL — ABNORMAL HIGH (ref 6–20)
CHLORIDE: 104 mmol/L (ref 101–111)
CO2: 15 mmol/L — AB (ref 22–32)
CREATININE: 2.28 mg/dL — AB (ref 0.44–1.00)
Calcium: 6.7 mg/dL — ABNORMAL LOW (ref 8.9–10.3)
GFR calc non Af Amer: 19 mL/min — ABNORMAL LOW (ref >60)
GFR, EST AFRICAN AMERICAN: 22 mL/min — AB (ref >60)
Glucose, Bld: 205 mg/dL — ABNORMAL HIGH (ref 65–99)
POTASSIUM: 3.6 mmol/L (ref 3.5–5.1)
SODIUM: 131 mmol/L — AB (ref 135–145)

## 2014-12-25 LAB — CBC WITH DIFFERENTIAL/PLATELET
BASOS ABS: 0 10*3/uL (ref 0.0–0.1)
Band Neutrophils: 25 %
Basophils Relative: 0 %
Blasts: 0 %
EOS PCT: 0 %
Eosinophils Absolute: 0 10*3/uL (ref 0.0–0.7)
HEMATOCRIT: 25.8 % — AB (ref 36.0–46.0)
Hemoglobin: 8.8 g/dL — ABNORMAL LOW (ref 12.0–15.0)
LYMPHS ABS: 0.8 10*3/uL (ref 0.7–4.0)
LYMPHS PCT: 2 %
MCH: 34.2 pg — ABNORMAL HIGH (ref 26.0–34.0)
MCHC: 34.1 g/dL (ref 30.0–36.0)
MCV: 100.4 fL — AB (ref 78.0–100.0)
METAMYELOCYTES PCT: 1 %
MONOS PCT: 5 %
Monocytes Absolute: 1.9 10*3/uL — ABNORMAL HIGH (ref 0.1–1.0)
Myelocytes: 3 %
NEUTROS ABS: 36.2 10*3/uL — AB (ref 1.7–7.7)
NRBC: 0 /100{WBCs}
Neutrophils Relative %: 64 %
OTHER: 0 %
PLATELETS: 131 10*3/uL — AB (ref 150–400)
Promyelocytes Absolute: 0 %
RBC: 2.57 MIL/uL — AB (ref 3.87–5.11)
RDW: 14.6 % (ref 11.5–15.5)
WBC Morphology: INCREASED
WBC: 38.9 10*3/uL — AB (ref 4.0–10.5)

## 2014-12-25 LAB — MAGNESIUM: MAGNESIUM: 1.4 mg/dL — AB (ref 1.7–2.4)

## 2014-12-25 LAB — PROCALCITONIN: Procalcitonin: 69.9 ng/mL

## 2014-12-25 MED ORDER — METOCLOPRAMIDE HCL 5 MG/ML IJ SOLN
5.0000 mg | Freq: Once | INTRAMUSCULAR | Status: AC
  Administered 2014-12-25: 5 mg via INTRAVENOUS
  Filled 2014-12-25: qty 2

## 2014-12-25 MED ORDER — MAGNESIUM SULFATE 2 GM/50ML IV SOLN
2.0000 g | Freq: Once | INTRAVENOUS | Status: AC
  Administered 2014-12-25: 2 g via INTRAVENOUS
  Filled 2014-12-25: qty 50

## 2014-12-25 MED ORDER — HALOPERIDOL LACTATE 5 MG/ML IJ SOLN
1.0000 mg | INTRAMUSCULAR | Status: DC | PRN
  Administered 2014-12-25 – 2014-12-26 (×4): 4 mg via INTRAVENOUS
  Filled 2014-12-25 (×4): qty 1

## 2014-12-25 MED ORDER — MORPHINE SULFATE (PF) 2 MG/ML IV SOLN
1.0000 mg | Freq: Once | INTRAVENOUS | Status: AC
  Administered 2014-12-25: 1 mg via INTRAVENOUS
  Filled 2014-12-25: qty 1

## 2014-12-25 NOTE — Progress Notes (Addendum)
ANTIBIOTIC CONSULT NOTE - follow-up level  Pharmacy Consult for vancomycin  Indication: rule out sepsis/ UTI  Allergies  Allergen Reactions  . Celebrex [Celecoxib]   . Nsaids   . Sulfa Antibiotics     Patient Measurements: Height: 5\' 10"  (177.8 cm) Weight: 251 lb 15.8 oz (114.3 kg) IBW/kg (Calculated) : 68.5  Vital Signs: Temp: 98.2 F (36.8 C) (10/19 1600) Temp Source: Axillary (10/19 1600) BP: 90/49 mmHg (10/19 1900) Pulse Rate: 94 (10/19 1900) Intake/Output from previous day: 10/18 0701 - 10/19 0700 In: 5726.7 [I.V.:4776.7; IV Piggyback:950] Out: 24 [Urine:24] Intake/Output from this shift:    Labs:  Recent Labs  12/25/2014 1852 12/28/2014 1904 12/24/14 0545 12/25/14 0545  WBC 4.2  --  33.6* 38.9*  HGB 9.9* 10.5* 9.2* 8.8*  PLT 163  --  181 131*  CREATININE 2.46* 2.20* 2.35* 2.28*   Estimated Creatinine Clearance: 27.4 mL/min (by C-G formula based on Cr of 2.28).  Recent Labs  12/25/14 1754  [place] 18     Microbiology: Recent Results (from the past 720 hour(s))  Urine culture     Status: None   Collection Time: 11/30/14 10:30 PM  Result Value Ref Range Status   Specimen Description URINE, CATHETERIZED  Final   Special Requests NONE  Final   Culture   Final    >=100,000 COLONIES/mL ESCHERICHIA COLI Confirmed Extended Spectrum Beta-Lactamase Producer (ESBL) Performed at [facility]    Report Status 12/04/2014 FINAL  Final   Organism ID, Bacteria ESCHERICHIA COLI  Final      Susceptibility   Escherichia coli - MIC*    AMPICILLIN >=32 RESISTANT Resistant     CEFAZOLIN >=64 RESISTANT Resistant     CEFTRIAXONE >=64 RESISTANT Resistant     CIPROFLOXACIN >=4 RESISTANT Resistant     GENTAMICIN >=16 RESISTANT Resistant     IMIPENEM <=0.25 SENSITIVE Sensitive     NITROFURANTOIN <=16 SENSITIVE Sensitive     TRIMETH/SULFA <=20 SENSITIVE Sensitive     AMPICILLIN/SULBACTAM >=32 RESISTANT Resistant     PIP/TAZO >=128 RESISTANT Resistant     *  >=100,000 COLONIES/mL ESCHERICHIA COLI  Blood culture (routine x 2)     Status: None   Collection Time: 12/04/14  6:00 PM  Result Value Ref Range Status   Specimen Description BLOOD LEFT HAND  Final   Special Requests BOTTLES DRAWN AEROBIC AND ANAEROBIC 5CC EACH  Final   Culture   Final    NO GROWTH 5 DAYS Performed at [facility]    Report Status 12/09/2014 FINAL  Final  Blood culture (routine x 2)     Status: None   Collection Time: 12/04/14  6:00 PM  Result Value Ref Range Status   Specimen Description BLOOD RIGHT HAND  Final   Special Requests BOTTLES DRAWN AEROBIC AND ANAEROBIC 5CC EACH  Final   Culture   Final    NO GROWTH 5 DAYS Performed at [facility]    Report Status 12/09/2014 FINAL  Final  MRSA PCR Screening     Status: Abnormal   Collection Time: 12/04/14 11:16 PM  Result Value Ref Range Status   MRSA by PCR POSITIVE (A) NEGATIVE Final    Comment:        The GeneXpert MRSA Assay (FDA approved for NASAL specimens only), is one component of a comprehensive MRSA colonization surveillance program. It is not intended to diagnose MRSA infection nor to guide or monitor treatment for MRSA infections. RESULT CALLED TO, READ BACK BY AND  VERIFIED WITH: NI,A/2W@0315  ON 12/05/14 BY KARCZEWSKI,S.   Urine culture     Status: None   Collection Time: 12/06/14 11:48 PM  Result Value Ref Range Status   Specimen Description URINE, CATHETERIZED  Final   Special Requests NONE  Final   Culture   Final    NO GROWTH 1 DAY Performed at [facility]    Report Status 12/08/2014 FINAL  Final  C difficile quick scan w PCR reflex     Status: Abnormal   Collection Time: 12/07/14  6:00 PM  Result Value Ref Range Status   C Diff antigen POSITIVE (A) NEGATIVE Final   C Diff toxin NEGATIVE NEGATIVE Final   C Diff interpretation   Final    C. difficile present, but toxin not detected. This indicates colonization. In most cases, this does not require treatment. If  patient has signs and symptoms consistent with colitis, consider treatment. Requires ENTERIC precautions.  Blood Culture (routine x 2)     Status: None (Preliminary result)   Collection Time: 12/14/2014  6:52 PM  Result Value Ref Range Status   Specimen Description BLOOD LEFT FOREARM  Final   Special Requests BOTTLES DRAWN AEROBIC ONLY 5ML  Final   Culture  Setup Time   Final    GRAM NEGATIVE RODS AEROBIC BOTTLE ONLY CRITICAL RESULT CALLED TO, READ BACK BY AND VERIFIED WITH: J DUNN 12/24/14 @ 0934 M VESTAL    Culture   Final    GRAM NEGATIVE RODS CULTURE REINCUBATED FOR BETTER GROWTH Performed at [facility]    Report Status PENDING  Incomplete  Blood Culture (routine x 2)     Status: None (Preliminary result)   Collection Time: 12/31/2014  6:59 PM  Result Value Ref Range Status   Specimen Description BLOOD RIGHT ANTECUBITAL  Final   Special Requests   Final    BOTTLES DRAWN AEROBIC AND ANAEROBIC 10CC AER,5CC ANA   Culture  Setup Time   Final    GRAM NEGATIVE RODS IN BOTH AEROBIC AND ANAEROBIC BOTTLES CRITICAL RESULT CALLED TO, READ BACK BY AND VERIFIED WITH: J DUNN,RN AT 0845 12/24/14 BY L BENFIELD    Culture   Final    GRAM NEGATIVE RODS CULTURE REINCUBATED FOR BETTER GROWTH Performed at [facility]    Report Status PENDING  Incomplete  Urine culture     Status: None (Preliminary result)   Collection Time: 12/24/14 12:03 AM  Result Value Ref Range Status   Specimen Description URINE, CATHETERIZED  Final   Special Requests NONE  Final   Culture   Final    >=100,000 COLONIES/mL PSEUDOMONAS AERUGINOSA Performed at [facility]    Report Status PENDING  Incomplete    Medical History: Past Medical History  Diagnosis Date  . Osteoporosis   . HTN (hypertension)     unspecified  . Atrial fibrillation ([place])   . Macular degeneration   . Osteoarthritis   . Edema   . Anemia   . Hydronephrosis     left  . UTI (lower urinary tract infection)   .  Pressure ulcer of back   . DM (diabetes mellitus) ([place])     lantus  . Depression   . Insomnia   . GERD (gastroesophageal reflux disease)   . CKD (chronic kidney disease), stage III   . Chronic diastolic CHF (congestive heart failure) (HCC)     Assessment: Patient's a 81 y.o F with hx ESBL UTI who was recently hospitalized earlier this  month for sepsis. She  presented to [facility] ED from [facility] on 10/17 with c/o vaginal swelling.  In the ED, she was found to be febrile, hypotensive, and have elevated LA and scr.  Vancomycin for sepsis and primaxin for UTI.  BCx2 10/17 >> GNR >>  UCx 10/17 >> greater 100k pseudomonas >>  Levels: 10/19 vanco level at 1800 = 53mcg/ml following 1gm x 2 (at 21:43 and 19:51 on 10/18)  Goal of Therapy:  Vancomycin trough level 15-20 mcg/ml  Plan:  - Continue vancomycin 1500mg  IV q48h based on 48h level post loading dose   GNR in urine and blood - suggest stop vancomycin as clinically indicated - Continue primaxin to 250mg  IV q6h -follow up renal function and sensitivies  Doreene Eland, PharmD, BCPS.   Pager: [phone] 12/25/2014 7:34 PM

## 2014-12-25 NOTE — Progress Notes (Signed)
ANTIBIOTIC CONSULT NOTE - INITIAL  Pharmacy Consult for vancomycin and imipenem Indication: rule out sepsis/ UTI  Allergies  Allergen Reactions  . Celebrex [Celecoxib]   . Nsaids   . Sulfa Antibiotics     Patient Measurements: Height: 5\' 10"  (177.8 cm) Weight: 251 lb 15.8 oz (114.3 kg) IBW/kg (Calculated) : 68.5  Vital Signs: Temp: 98.6 F (37 C) (10/19 0800) Temp Source: Axillary (10/19 0800) BP: 116/55 mmHg (10/19 1300) Pulse Rate: 97 (10/19 1300) Intake/Output from previous day: 10/18 0701 - 10/19 0700 In: 5726.7 [I.V.:4776.7; IV Piggyback:950] Out: 24 [Urine:24] Intake/Output from this shift: Total I/O In: 1336.9 [P.O.:30; I.V.:1156.9; IV Piggyback:150] Out: -   Labs:  Recent Labs  12/29/2014 1852 12/21/2014 1904 12/24/14 0545 12/25/14 0545  WBC 4.2  --  33.6* 38.9*  HGB 9.9* 10.5* 9.2* 8.8*  PLT 163  --  181 131*  CREATININE 2.46* 2.20* 2.35* 2.28*   Estimated Creatinine Clearance: 27.4 mL/min (by C-G formula based on Cr of 2.28). No results for input(s): VANCOTROUGH, VANCOPEAK, VANCORANDOM, GENTTROUGH, GENTPEAK, GENTRANDOM, TOBRATROUGH, TOBRAPEAK, TOBRARND, AMIKACINPEAK, AMIKACINTROU, AMIKACIN in the last 72 hours.   Microbiology: Recent Results (from the past 720 hour(s))  Urine culture     Status: None   Collection Time: 11/30/14 10:30 PM  Result Value Ref Range Status   Specimen Description URINE, CATHETERIZED  Final   Special Requests NONE  Final   Culture   Final    >=100,000 COLONIES/mL ESCHERICHIA COLI Confirmed Extended Spectrum Beta-Lactamase Producer (ESBL) Performed at [facility]    Report Status 12/04/2014 FINAL  Final   Organism ID, Bacteria ESCHERICHIA COLI  Final      Susceptibility   Escherichia coli - MIC*    AMPICILLIN >=32 RESISTANT Resistant     CEFAZOLIN >=64 RESISTANT Resistant     CEFTRIAXONE >=64 RESISTANT Resistant     CIPROFLOXACIN >=4 RESISTANT Resistant     GENTAMICIN >=16 RESISTANT Resistant     IMIPENEM  <=0.25 SENSITIVE Sensitive     NITROFURANTOIN <=16 SENSITIVE Sensitive     TRIMETH/SULFA <=20 SENSITIVE Sensitive     AMPICILLIN/SULBACTAM >=32 RESISTANT Resistant     PIP/TAZO >=128 RESISTANT Resistant     * >=100,000 COLONIES/mL ESCHERICHIA COLI  Blood culture (routine x 2)     Status: None   Collection Time: 12/04/14  6:00 PM  Result Value Ref Range Status   Specimen Description BLOOD LEFT HAND  Final   Special Requests BOTTLES DRAWN AEROBIC AND ANAEROBIC 5CC EACH  Final   Culture   Final    NO GROWTH 5 DAYS Performed at [facility]    Report Status 12/09/2014 FINAL  Final  Blood culture (routine x 2)     Status: None   Collection Time: 12/04/14  6:00 PM  Result Value Ref Range Status   Specimen Description BLOOD RIGHT HAND  Final   Special Requests BOTTLES DRAWN AEROBIC AND ANAEROBIC 5CC EACH  Final   Culture   Final    NO GROWTH 5 DAYS Performed at [facility]    Report Status 12/09/2014 FINAL  Final  MRSA PCR Screening     Status: Abnormal   Collection Time: 12/04/14 11:16 PM  Result Value Ref Range Status   MRSA by PCR POSITIVE (A) NEGATIVE Final    Comment:        The GeneXpert MRSA Assay (FDA approved for NASAL specimens only), is one component of a comprehensive MRSA colonization surveillance program. It is not intended to  diagnose MRSA infection nor to guide or monitor treatment for MRSA infections. RESULT CALLED TO, READ BACK BY AND VERIFIED WITH: NI,A/2W@0315  ON 12/05/14 BY KARCZEWSKI,S.   Urine culture     Status: None   Collection Time: 12/06/14 11:48 PM  Result Value Ref Range Status   Specimen Description URINE, CATHETERIZED  Final   Special Requests NONE  Final   Culture   Final    NO GROWTH 1 DAY Performed at [facility]    Report Status 12/08/2014 FINAL  Final  C difficile quick scan w PCR reflex     Status: Abnormal   Collection Time: 12/07/14  6:00 PM  Result Value Ref Range Status   C Diff antigen POSITIVE (A)  NEGATIVE Final   C Diff toxin NEGATIVE NEGATIVE Final   C Diff interpretation   Final    C. difficile present, but toxin not detected. This indicates colonization. In most cases, this does not require treatment. If patient has signs and symptoms consistent with colitis, consider treatment. Requires ENTERIC precautions.  Blood Culture (routine x 2)     Status: None (Preliminary result)   Collection Time: 12/09/2014  6:52 PM  Result Value Ref Range Status   Specimen Description BLOOD LEFT FOREARM  Final   Special Requests BOTTLES DRAWN AEROBIC ONLY 5ML  Final   Culture  Setup Time   Final    GRAM NEGATIVE RODS AEROBIC BOTTLE ONLY CRITICAL RESULT CALLED TO, READ BACK BY AND VERIFIED WITH: J DUNN 12/24/14 @ 0934 M VESTAL    Culture   Final    GRAM NEGATIVE RODS CULTURE REINCUBATED FOR BETTER GROWTH Performed at [facility]    Report Status PENDING  Incomplete  Blood Culture (routine x 2)     Status: None (Preliminary result)   Collection Time: 12/17/2014  6:59 PM  Result Value Ref Range Status   Specimen Description BLOOD RIGHT ANTECUBITAL  Final   Special Requests   Final    BOTTLES DRAWN AEROBIC AND ANAEROBIC 10CC AER,5CC ANA   Culture  Setup Time   Final    GRAM NEGATIVE RODS IN BOTH AEROBIC AND ANAEROBIC BOTTLES CRITICAL RESULT CALLED TO, READ BACK BY AND VERIFIED WITH: J DUNN,RN AT 0845 12/24/14 BY L BENFIELD    Culture   Final    GRAM NEGATIVE RODS CULTURE REINCUBATED FOR BETTER GROWTH Performed at [facility]    Report Status PENDING  Incomplete  Urine culture     Status: None (Preliminary result)   Collection Time: 12/24/14 12:03 AM  Result Value Ref Range Status   Specimen Description URINE, CATHETERIZED  Final   Special Requests NONE  Final   Culture   Final    >=100,000 COLONIES/mL PSEUDOMONAS AERUGINOSA Performed at [facility]    Report Status PENDING  Incomplete    Medical History: Past Medical History  Diagnosis Date  . Osteoporosis    . HTN (hypertension)     unspecified  . Atrial fibrillation ([place])   . Macular degeneration   . Osteoarthritis   . Edema   . Anemia   . Hydronephrosis     left  . UTI (lower urinary tract infection)   . Pressure ulcer of back   . DM (diabetes mellitus) ([place])     lantus  . Depression   . Insomnia   . GERD (gastroesophageal reflux disease)   . CKD (chronic kidney disease), stage III   . Chronic diastolic CHF (congestive heart failure) ([place])  Assessment: Patient's a 81 y.o F with hx ESBL UTI who was recently hospitalized earlier this month for sepsis. She  presented to [facility] ED from [facility] on 10/17 with c/o vaginal swelling.  In the ED, she was found to be febrile, hypotensive, and have elevated LA and scr.  Vancomycin for sepsis and primaxin for UTI.  BCx2 10/17 >> GNR >>  UCx 10/17 >> greater 100k pseudomonas >>  Goal of Therapy:  Vancomycin trough level 15-20 mcg/ml  Plan:  - vancomycin 2gm IV given on 10/17, then 1500mg  IV q48h -will check vanc trough due to pt being oliguric - continue primaxin 250mg  IV q6h -follow up renal function and sensitivies  Dolly Rias RPh 12/25/2014, 1:17 PM Pager [phone]

## 2014-12-25 NOTE — Progress Notes (Signed)
Pt right hand cap refill less than 3 seconds but called to remove art line due to discoloration of hand. Dr. Oletta Darter notified and art line removed.

## 2014-12-25 NOTE — Progress Notes (Signed)
CRITICAL VALUE ALERT  Critical value received:  lactic  Date of notification:  12/25/14   Time of notification:  1430  Critical value read back:Yes.    Nurse who received alert:  Javier Glazier, RN  MD notified (1st page):  [place]  Time of first page:  1430  MD notified (2nd page):  Time of second page:  Responding MD:  Lake Bells  Time MD responded:  1430

## 2014-12-25 NOTE — Progress Notes (Addendum)
[place] Progress Note Patient Name: Kathryn [name] DOB: February 22, 1934 MRN: [id number]   Date of Service  12/25/2014  HPI/Events of Note  R hand with A-line appears dusky and blue. Patient is currently on Norepinephrine and Vasopressin IV infusion. Patient has capillary refill according to bedside nurse.   eICU Interventions  Will D/C A-line and attempt to wean Norepinephrine IV infusion as tolerated.      Intervention Category Major Interventions: Other:  [name] [name] 12/25/2014, 6:08 PM

## 2014-12-25 NOTE — Clinical Documentation Improvement (Signed)
Critical Care  Based on the clinical findings below, please document any associated diagnoses/conditions the patient has or may have.   ARF  ARF with ATN  CKD3 as documented; "appears at baseline but she is oliguric"- 10/18 progress note  Other  Clinically Undetermined  Supporting Information:  "Her creatinine ranges from 1.8-2.7 and was 2.46 on admission but she has had fever tachycardia and hypotension" documented in Urology Consult  Prolonged hypotension with 2 vasopressors initiated  Please exercise your independent, professional judgment when responding. A specific answer is not anticipated or expected.  Thank You, Zoila Shutter RN, [place] [phone]

## 2014-12-25 NOTE — Progress Notes (Signed)
PULMONARY / CRITICAL CARE MEDICINE   Name: Kathryn [name] MRN: [id number] DOB: 1933-10-12    ADMISSION DATE:  12/29/2014 CONSULTATION DATE:  12/26/2014  REFERRING MD :  EDP  CHIEF COMPLAINT:  Hypotension  INITIAL PRESENTATION:  81 y.o. F from nursing home, brought to [facility] ED 10/17 after staff unable to place foley due to vaginal swelling.  In ED, found to be in shock, with concern for UTI.  PCCM called for admission.    STUDIES:  CXR 10/17 >> bibasilar atx, chronic elevation of right hemidiaphragm. ECHO 10/18 >> LV nml size, concentric hypertrophy, EF 40-45%, diffuse hypokinesis, unable to assess for diastolic dysfunction, nml PA-S pressure ->mild biventricular dysfunction  SIGNIFICANT EVENTS: 10/17 - admitted with shock, septic vs hypovolemic,    SUBJECTIVE:  RN reports pt with ongoing nausea, spitting up gastric secretions.  Pt denies chest pain, abdominal pain, current nausea.  Afebrile, WBC increased to 38k.  CVP 16  VITAL SIGNS: Temp:  [98.6 F (37 C)-100 F (37.8 C)] 98.6 F (37 C) (10/19 0800) Pulse Rate:  [48-125] 93 (10/19 1130) Resp:  [14-25] 25 (10/19 1130) BP: (91-146)/(44-95) 101/61 mmHg (10/19 1130) SpO2:  [81 %-100 %] 96 % (10/19 1130) Arterial Line BP: (79-127)/(48-102) 107/56 mmHg (10/19 1130)   HEMODYNAMICS: CVP:  [6 mmHg-16 mmHg] 16 mmHg  VENTILATOR SETTINGS:     INTAKE / OUTPUT: Intake/Output      10/18 0701 - 10/19 0700 10/19 0701 - 10/20 0700   P.O.  30   I.V. (mL/kg) 4776.7 (41.8) 714.4 (6.3)   IV Piggyback 950 100   Total Intake(mL/kg) 5726.7 (50.1) 844.4 (7.4)   Urine (mL/kg/hr) 24 (0)    Stool     Total Output 24     Net +5702.7 +844.4          PHYSICAL EXAMINATION: General: Chronically ill appearing female, pale, uncomfortable appearing, intermittently moans/yells out HENT: NCAT, mucus membranes dry PULM: normal respiratory effort, clear to auscultation CV: Tachy, irregular, no murmur, cool extremities GI: BS+, soft,  nontender Derm: decreased skin turgor, edema BLE, pale Neuro: awake, answers situational questions appropriately, follows commands, currently denies nausea   LABS:  CBC  Recent Labs Lab 12/11/2014 1852 01/02/2015 1904 12/24/14 0545 12/25/14 0545  WBC 4.2  --  33.6* 38.9*  HGB 9.9* 10.5* 9.2* 8.8*  HCT 30.5* 31.0* 28.0* 25.8*  PLT 163  --  181 131*   Coag's No results for input(s): APTT, INR in the last 168 hours.   BMET  Recent Labs Lab 12/22/2014 1852 12/18/2014 1904 12/24/14 0545 12/25/14 0545  NA 138 136 135 131*  K 3.5 3.5 3.2* 3.6  CL 100* 98* 107 104  CO2 19*  --  16* 15*  BUN 33* 32* 32* 33*  CREATININE 2.46* 2.20* 2.35* 2.28*  GLUCOSE 153* 146* 248* 205*   Electrolytes  Recent Labs Lab 12/13/2014 1852 12/24/14 0545 12/25/14 0545  CALCIUM 8.3* 7.3* 6.7*  MG  --  1.0* 1.4*  PHOS  --  3.0  --    Sepsis Markers  Recent Labs Lab 12/18/2014 2230 12/24/14 0035 12/24/14 0545 12/25/14 0545  LATICACIDVEN 7.5* 7.9* 5.7*  --   PROCALCITON 31.60  --  76.88 69.90   ABG No results for input(s): PHART, PCO2ART, PO2ART in the last 168 hours.   Liver Enzymes  Recent Labs Lab 12/20/2014 1852  AST 63*  ALT 37  ALKPHOS 269*  BILITOT 1.6*  ALBUMIN 2.3*   Cardiac Enzymes  Recent Labs Lab 12/16/2014  2230 12/24/14 0545  TROPONINI 0.44* 1.61*   Glucose  Recent Labs Lab 12/24/14 0758 12/24/14 1215 12/24/14 2059 12/24/14 2349 12/25/14 0347 12/25/14 0806  GLUCAP 174* 138* 103* 148* 152* 157*    Imaging  10/17 CXR images personally reviewed> R IJ CVL in place, bilateral mild scattered air space disease   ASSESSMENT / PLAN:  CARDIOVASCULAR CVL R IJ 10/17 >>> EKG 10/17 > Afib, non-specific ST wave changes lateral leads A:  Septic shock - secondary to UTI  Afib, not in RVR History of hypertension Demand ischemia due to sepsis DO NOT RESUSCITATE P:  Norepinephrine as needed to maintain goal MAP > 65 Vasopressin Monitor CVP Stress dose  steroids Hold outpatient amlodipine, furosemide, lopressor  INFECTIOUS A:   Septic shock due to UTI, ddx includes skin infection GNR Bacteremia  Pressure ulcers of back Hx ESBL UTI P:   BCx2 10/17 >> GNR >>  UCx 10/17 >> greater 100k pseudomonas >>  Abx: Vanc, start date 10/17, day 3/x Abx: Imipenem, start date 10/17, day 3/x  Wound care consult Await culture sensitivities  Trend PCT  PULMONARY A: Bibasilar atelectasis Chronically elevated right hemidiaphragm DO NOT INTUBATE P:   Incentive spirometry DuoNebs CXR intermittently Oxygen as needed to support sats > 92%  RENAL A:   Chronic kidney disease, Cr appears at baseline but she is oliguric AG metabolic acidosis - lactate > improving Hypocalcemia Hypomagnesemia  P:   NS @ 125 Monitor BMET and UOP Replace electrolytes as needed, Mg 10/19 Do not feel she would be an HD candidate  GASTROINTESTINAL A:   GERD Nutrition Recent history of elevated LFTs, improving here; no belly pain on my exam to suggest biliary pathology S/p cholecystecomy P:   Pantoprazole Advance diet to clears as tolerated Repeat LFT in AM PRN zofran  HEMATOLOGIC A:   Macrocytic Anemia of chronic disease VTE Prophylaxis P:  Transfuse for Hgb < 7 SCD's / Heparin CBC in AM  ENDOCRINE A:   DM 2 P:   SSI  NEUROLOGIC A:   Hx Depression P:   Hold outpatient gabapentin  Family updated: No family available am 10/19.  Updated via phone per Dr. Lake Bells 10/18  Interdisciplinary Family Meeting v [place] Meeting:  Due by: 10/23.    Noe Gens, NP-C [facility] Pgr: [phone] or if no answer [phone] 12/25/2014, 11:42 AM

## 2014-12-26 DIAGNOSIS — Z515 Encounter for palliative care: Secondary | ICD-10-CM

## 2014-12-26 DIAGNOSIS — R451 Restlessness and agitation: Secondary | ICD-10-CM

## 2014-12-26 LAB — BASIC METABOLIC PANEL
Anion gap: 9 (ref 5–15)
BUN: 36 mg/dL — AB (ref 6–20)
CALCIUM: 6.5 mg/dL — AB (ref 8.9–10.3)
CO2: 16 mmol/L — ABNORMAL LOW (ref 22–32)
CREATININE: 2.18 mg/dL — AB (ref 0.44–1.00)
Chloride: 107 mmol/L (ref 101–111)
GFR calc Af Amer: 24 mL/min — ABNORMAL LOW (ref >60)
GFR, EST NON AFRICAN AMERICAN: 20 mL/min — AB (ref >60)
Glucose, Bld: 131 mg/dL — ABNORMAL HIGH (ref 65–99)
Potassium: 3.1 mmol/L — ABNORMAL LOW (ref 3.5–5.1)
SODIUM: 132 mmol/L — AB (ref 135–145)

## 2014-12-26 LAB — GLUCOSE, CAPILLARY
GLUCOSE-CAPILLARY: 126 mg/dL — AB (ref 65–99)
Glucose-Capillary: 116 mg/dL — ABNORMAL HIGH (ref 65–99)
Glucose-Capillary: 99 mg/dL (ref 65–99)
Glucose-Capillary: 97 mg/dL (ref 65–99)
Glucose-Capillary: 127 mg/dL — ABNORMAL HIGH (ref 65–99)

## 2014-12-26 LAB — CBC
HEMATOCRIT: 24.5 % — AB (ref 36.0–46.0)
Hemoglobin: 8.4 g/dL — ABNORMAL LOW (ref 12.0–15.0)
MCH: 33.7 pg (ref 26.0–34.0)
MCHC: 34.3 g/dL (ref 30.0–36.0)
MCV: 98.4 fL (ref 78.0–100.0)
Platelets: 82 10*3/uL — ABNORMAL LOW (ref 150–400)
RBC: 2.49 MIL/uL — ABNORMAL LOW (ref 3.87–5.11)
RDW: 14.6 % (ref 11.5–15.5)
WBC: 30.8 10*3/uL — ABNORMAL HIGH (ref 4.0–10.5)

## 2014-12-26 LAB — URINE CULTURE

## 2014-12-26 LAB — MAGNESIUM: MAGNESIUM: 1.7 mg/dL (ref 1.7–2.4)

## 2014-12-26 MED ORDER — RISAQUAD PO CAPS: 2.0000 | ORAL_CAPSULE | Freq: Every day | ORAL | Status: DC

## 2014-12-26 MED ORDER — SODIUM CHLORIDE 0.9 % IV SOLN
2.0000 mg/h | INTRAVENOUS | Status: DC
  Administered 2014-12-27: 1 mg/h via INTRAVENOUS
  Filled 2014-12-26: qty 10

## 2014-12-26 MED ORDER — FENTANYL CITRATE (PF) 100 MCG/2ML IJ SOLN
50.0000 ug | Freq: Once | INTRAMUSCULAR | Status: AC
  Administered 2014-12-26: 50 ug via INTRAVENOUS

## 2014-12-26 MED ORDER — SODIUM CHLORIDE 0.9 % IV SOLN
1.0000 mg/h | INTRAVENOUS | Status: DC
  Administered 2014-12-26: 1 mg/h via INTRAVENOUS
  Filled 2014-12-26: qty 10

## 2014-12-26 MED ORDER — GLYCOPYRROLATE 0.2 MG/ML IJ SOLN
0.2000 mg | INTRAMUSCULAR | Status: DC | PRN
  Filled 2014-12-26: qty 1

## 2014-12-26 MED ORDER — IPRATROPIUM-ALBUTEROL 0.5-2.5 (3) MG/3ML IN SOLN: 3.0000 mL | Freq: Four times a day (QID) | RESPIRATORY_TRACT | Status: DC | PRN

## 2014-12-26 MED ORDER — PIPERACILLIN-TAZOBACTAM 3.375 G IVPB
3.3750 g | Freq: Three times a day (TID) | INTRAVENOUS | Status: DC
  Administered 2014-12-26 – 2014-12-27 (×3): 3.375 g via INTRAVENOUS
  Filled 2014-12-26 (×3): qty 50

## 2014-12-26 MED ORDER — MORPHINE BOLUS VIA INFUSION
1.0000 mg | INTRAVENOUS | Status: DC | PRN
  Administered 2014-12-26 (×2): 1 mg via INTRAVENOUS
  Administered 2014-12-27: 2 mg via INTRAVENOUS
  Administered 2014-12-27 (×5): 1 mg via INTRAVENOUS
  Administered 2014-12-27: 2 mg via INTRAVENOUS
  Filled 2014-12-26 (×10): qty 2

## 2014-12-26 MED ORDER — SODIUM CHLORIDE 0.9 % IV SOLN: INTRAVENOUS | Status: DC

## 2014-12-26 NOTE — Consult Note (Signed)
Consultation Note Date: 12/26/2014   Patient Name: Kathryn [name]  DOB: Jul 22, 1933  MRN: [id number]  Age / Sex: 81 y.o., female  PCP: Blanchie Serve, MD Referring Physician: Chesley Mires, MD  Reason for Consultation: Terminal care    Clinical Assessment/Narrative: [name] appears to be comfortable on low dose morphine infusion. I provided emotional support to sister and nephew at bedside. Son and granddaughter recently left bedside. I did speak with son and he confirms the plan to keep her comfortable. He does not want her to suffer. He says "I knew this was coming but it doesn't make it any easier." We discussed not prolonging her suffering and d/c of antibiotics, amiodarone, and levophed. I told him that once these medications stop - especially the levophed - she may not live very long. He has decided that he wants to have his family together tomorrow before stopping these medications but does not want any further lab work or blood sticks including CBGs. Emotional support provided.   Contacts/Participants in Discussion: Primary Decision Maker: Kathryn [name]   Relationship to Patient Son  SUMMARY OF RECOMMENDATIONS  Code Status/Advance Care Planning: DNR    Code Status Orders        Start     Ordered   12/17/2014 2115  Do not attempt resuscitation (DNR)   Continuous    Question Answer Comment  In the event of cardiac or respiratory ARREST Do not call a "code blue"   In the event of cardiac or respiratory ARREST Do not perform Intubation, CPR, defibrillation or ACLS   In the event of cardiac or respiratory ARREST Use medication by any route, position, wound care, and other measures to relive pain and suffering. May use oxygen, suction and manual treatment of airway obstruction as needed for comfort.   Comments Vasopressors OK      12/08/2014 2118    Advance Directive Documentation        Most Recent Value    Type of Advance Directive  Healthcare Power of Attorney   Pre-existing out of facility DNR order (yellow form or pink MOST form)     "MOST" Form in Place?        Symptom Management:   Pain: Morphine @ 1 mg/hr. Bolus as needed.   Agitation: Haldol prn.   Secretions: Robinul prn.   Palliative Prophylaxis:   Eye Care, Oral Care and Turn Reposition  Additional Recommendations (Limitations, Scope, Preferences):  Full Comfort Care - do not escalate care. Will d/c all other medications not for comfort tomorrow when family ready and at bedside.   Psycho-social/Spiritual:  Support System: Adequate Additional Recommendations: Grief/Bereavement Support  Prognosis: Hours - Days  Discharge Planning: Anticipate hospital death.    Chief Complaint/ Primary Diagnoses: Present on Admission:  . Septic shock ([place])  I have reviewed the medical record, interviewed the patient and family, and examined the patient. The following aspects are pertinent.  Past Medical History  Diagnosis Date  . Osteoporosis   . HTN (hypertension)     unspecified  . Atrial fibrillation ([place])   . Macular degeneration   . Osteoarthritis   . Edema   . Anemia   . Hydronephrosis     left  . UTI (lower urinary tract infection)   . Pressure ulcer of back   . DM (diabetes mellitus) ([place])     lantus  . Depression   . Insomnia   . GERD (gastroesophageal reflux disease)   . CKD (chronic kidney disease), stage III   .  Chronic diastolic CHF (congestive heart failure) (HCC)    Social History   Social History  . Marital Status: Married    Spouse Name: N/A  . Number of Children: 1  . Years of Education: N/A   Occupational History  . Retired   .     Social History Main Topics  . Smoking status: Never Smoker   . Smokeless tobacco: Never Used  . Alcohol Use: No  . Drug Use: No  . Sexual Activity: No   Other Topics Concern  . None   Social History Narrative   Family History  Problem Relation  Age of Onset  . Diabetes Father   . Atrial fibrillation Father   . Breast cancer Paternal Grandmother   . Heart disease Mother    Scheduled Meds: . antiseptic oral rinse  7 mL Mouth Rinse q12n4p  . chlorhexidine  15 mL Mouth Rinse BID  . heparin  5,000 Units Subcutaneous 3 times per day  . hydrocortisone sodium succinate  50 mg Intravenous 4 times per day  . insulin aspart  0-15 Units Subcutaneous 6 times per day  . ondansetron (ZOFRAN) IV  4 mg Intravenous 3 times per day  . pantoprazole (PROTONIX) IV  40 mg Intravenous Q24H  . piperacillin-tazobactam (ZOSYN)  IV  3.375 g Intravenous Q8H   Continuous Infusions: . sodium chloride 1,000 mL (12/26/14 0330)  . sodium chloride 75 mL/hr at 12/26/14 0854  . amiodarone 30 mg/hr (12/26/14 0741)  . morphine 1 mg/hr (12/26/14 1138)  . norepinephrine (LEVOPHED) Adult infusion 30 mcg/min (12/26/14 1415)   PRN Meds:.sodium chloride, Place/Maintain arterial line **AND** sodium chloride, fentaNYL (SUBLIMAZE) injection, haloperidol lactate, ipratropium-albuterol Medications Prior to Admission:  Prior to Admission medications   Medication Sig Start Date End Date Taking? Authorizing Provider  amLODipine (NORVASC) 10 MG tablet Take 10 mg by mouth daily.   Yes Historical Provider, MD  Calcium Carbonate-Vitamin D 600-400 MG-UNIT per tablet Take 1 tablet by mouth 2 (two) times daily.    Yes Historical Provider, MD  Cholecalciferol (VITAMIN D3) 2000 UNITS TABS Take 1 tablet by mouth daily.    Yes Historical Provider, MD  feeding supplement, GLUCERNA SHAKE, (GLUCERNA SHAKE) LIQD Take 237 mLs by mouth 2 (two) times daily between meals. 12/13/14  Yes Barton Dubois, MD  furosemide (LASIX) 20 MG tablet Take 2 tablets (40 mg total) by mouth 2 (two) times daily. 12/13/14  Yes Barton Dubois, MD  gabapentin (NEURONTIN) 300 MG capsule Take 300 mg by mouth at bedtime.    Yes Historical Provider, MD  HYDROcodone-acetaminophen (NORCO/VICODIN) 5-325 MG tablet Take 1  tablet by mouth every 6 (six) hours as needed for severe pain.  10/21/14  Yes Historical Provider, MD  insulin lispro (HUMALOG) 100 UNIT/ML cartridge Inject 5 Units into the skin 3 (three) times daily as needed (blood sugar). Check blood sugar three times daily with meals if CBG> 200 inject 5 units subcutaneously   Yes Historical Provider, MD  LANTUS 100 UNIT/ML injection Inject 15 Units into the skin daily.  10/18/14  Yes Historical Provider, MD  metoprolol tartrate (LOPRESSOR) 25 MG tablet Take 1 tablet (25 mg total) by mouth 2 (two) times daily. 12/13/14  Yes Barton Dubois, MD  Multiple Vitamins-Minerals (DECUBI-VITE) CAPS Take 1 capsule by mouth daily.    Yes Historical Provider, MD  pantoprazole (PROTONIX) 40 MG tablet Take 1 tablet (40 mg total) by mouth daily at 12 noon. 12/13/14  Yes Barton Dubois, MD  potassium chloride SA (K-DUR,KLOR-CON) 20  MEQ tablet Take 2 tablets (40 mEq total) by mouth daily. 12/13/14  Yes Barton Dubois, MD  Protein (PROCEL) POWD Take 1 scoop by mouth 2 (two) times daily.    Yes Historical Provider, MD  saccharomyces boulardii (FLORASTOR) 250 MG capsule Take 1 capsule (250 mg total) by mouth 2 (two) times daily. 12/13/14  Yes Barton Dubois, MD  simethicone (MYLICON) 80 MG chewable tablet Chew 160 mg by mouth 3 (three) times daily. For gas pains.   Yes Historical Provider, MD  AMBULATORY NON FORMULARY MEDICATION Medication Name: Med Pass 240 mL with water three times daily for hydration    Historical Provider, MD  hydrOXYzine (ATARAX/VISTARIL) 25 MG tablet Take 25 mg by mouth every 8 (eight) hours as needed for itching.    Historical Provider, MD  ipratropium-albuterol (DUONEB) 0.5-2.5 (3) MG/3ML SOLN Take 3 mLs by nebulization. Administer 3 ml via nebulizer four times a day as needed for cough and wheezing    Historical Provider, MD  ondansetron (ZOFRAN) 4 MG tablet Take 4 mg by mouth every 8 (eight) hours as needed for nausea or vomiting.    Historical Provider, MD   polyethylene glycol (MIRALAX / GLYCOLAX) packet Take 17 g by mouth daily as needed for moderate constipation. For constipation 12/13/14   Barton Dubois, MD   Allergies  Allergen Reactions  . Celebrex [Celecoxib]   . Nsaids   . Sulfa Antibiotics    CBC:    Component Value Date/Time   WBC 30.8* 12/26/2014 0330   WBC 4.8 08/14/2014   WBC 9.1 07/22/2011 1517   HGB 8.4* 12/26/2014 0330   HGB 11.7 07/22/2011 1517   HCT 24.5* 12/26/2014 0330   HCT 33.4* 07/22/2011 1517   PLT 82* 12/26/2014 0330   PLT 246 07/22/2011 1517   MCV 98.4 12/26/2014 0330   MCV 91.5 07/22/2011 1517   NEUTROABS 36.2* 12/25/2014 0545   NEUTROABS 3 03/05/2014   NEUTROABS 6.8* 07/22/2011 1517   LYMPHSABS 0.8 12/25/2014 0545   LYMPHSABS 1.2 07/22/2011 1517   MONOABS 1.9* 12/25/2014 0545   MONOABS 0.6 07/22/2011 1517   EOSABS 0.0 12/25/2014 0545   EOSABS 0.5 07/22/2011 1517   BASOSABS 0.0 12/25/2014 0545   BASOSABS 0.1 07/22/2011 1517   Comprehensive Metabolic Panel:    Component Value Date/Time   NA 132* 12/26/2014 0330   NA 139 08/14/2014   K 3.1* 12/26/2014 0330   CL 107 12/26/2014 0330   CO2 16* 12/26/2014 0330   BUN 36* 12/26/2014 0330   BUN 43* 08/14/2014   CREATININE 2.18* 12/26/2014 0330   CREATININE 1.4* 08/14/2014   GLUCOSE 131* 12/26/2014 0330   CALCIUM 6.5* 12/26/2014 0330   CALCIUM 8.2* 10/10/2010 1613   AST 98* 12/25/2014 1230   ALT 41 12/25/2014 1230   ALKPHOS 218* 12/25/2014 1230   BILITOT 1.4* 12/25/2014 1230   PROT 4.3* 12/25/2014 1230   ALBUMIN 1.8* 12/25/2014 1230    Review of Systems  Unable to perform ROS   Physical Exam  Constitutional: She appears well-developed. She appears lethargic. She has a sickly appearance. No distress.  Respiratory: No accessory muscle usage. No respiratory distress. She has decreased breath sounds.  GI: Soft. She exhibits no distension. There is no tenderness.  Musculoskeletal: She exhibits edema.  Neurological: She appears lethargic.   Nodded head no to pain but would not answer or follow any other commands. Minimally responsive.  Skin: There is pallor.    Vital Signs: BP 101/53 mmHg  Pulse 82  Temp(Src) 96.6  F (35.9 C) (Axillary)  Resp 21  Ht 5\' 10"  (1.778 m)  Wt 131.6 kg (290 lb 2 oz)  BMI 41.63 kg/m2  SpO2 96% SpO2:    O2 Device:SpO2: 96 % O2 Flow Rate: .O2 Flow Rate (L/min): 2 L/min Intake/output summary:  Intake/Output Summary (Last 24 hours) at 12/26/14 1624 Last data filed at 12/26/14 1500  Gross per 24 hour  Intake 4225.24 ml  Output      8 ml  Net 4217.24 ml   LBM:  BMP Latest Ref Rng 12/26/2014 12/25/2014 12/24/2014  Glucose 65 - 99 mg/dL 131(H) 205(H) 248(H)  BUN 6 - 20 mg/dL 36(H) 33(H) 32(H)  Creatinine 0.44 - 1.00 mg/dL 2.18(H) 2.28(H) 2.35(H)  Sodium 135 - 145 mmol/L 132(L) 131(L) 135  Potassium 3.5 - 5.1 mmol/L 3.1(L) 3.6 3.2(L)  Chloride 101 - 111 mmol/L 107 104 107  CO2 22 - 32 mmol/L 16(L) 15(L) 16(L)  Calcium 8.9 - 10.3 mg/dL 6.5(L) 6.7(L) 7.3(L)    Baseline Weight: Weight: 107.049 kg (236 lb) Most recent weight: Weight: 131.6 kg (290 lb 2 oz)      Palliative Assessment/Data:  Flowsheet Rows        Most Recent Value   Intake Tab    Referral Department  Critical care   Unit at Time of Referral  ICU   Palliative Care Primary Diagnosis  Cardiac   Date Notified  12/26/14   Palliative Care Type  New Palliative care   Reason for referral  End of Life Care Assistance   Date of Admission  01/04/2015   Date first seen by Palliative Care  12/26/14   # of days Palliative referral response time  0 Day(s)   # of days IP prior to Palliative referral  3   Clinical Assessment    Psychosocial & Spiritual Assessment    Palliative Care Outcomes       Additional Data Reviewed: Recent Labs     12/25/14  0545  12/26/14  0330  WBC  38.9*  30.8*  HGB  8.8*  8.4*  PLT  131*  82*  NA  131*  132*  BUN  33*  36*  CREATININE  2.28*  2.18*    Time In: 1550 Time Out: 1710 Time Total:  28min Greater than 50%  of this time was spent counseling and coordinating care related to the above assessment and plan.  Signed by: Pershing Proud, NP  Pershing Proud, NP  33/82/5053, 4:24 PM  Please contact Palliative Medicine Team phone at [phone] for questions and concerns.

## 2014-12-26 NOTE — Progress Notes (Signed)
[facility] Progress Note Patient Name: Kathryn [name] DOB: Jun 14, 1933 MRN: [id number]   Date of Service  12/26/2014  HPI/Events of Note  Pt has been moaning in pain. She is awake and oriented, cannot localize the pain, says it is everywhere. Has been getting 12.5-25mcg fentanyl q1h. I will give higher dose x 1, see how she responds and tolerates. May change to a longer acting agent depending on response. Note that discussions are planned regarding comfort measures. If unable to get her symptoms managed will attempt to contact family to discuss further.   eICU Interventions       Intervention Category Intermediate Interventions: Pain - evaluation and management  Dillie Burandt S. 12/26/2014, 12:55 AM

## 2014-12-26 NOTE — Clinical Social Work Note (Signed)
Clinical Social Work Assessment  Patient Details  Name: Kathryn [name] MRN: [id number] Date of Birth: Nov 28, 1933  Date of referral:  12/26/14               Reason for consult:  Discharge Planning                Permission sought to share information with:  Facility Art therapist granted to share information::  Yes, Verbal Permission Granted  Name::        Agency::     Relationship::     Contact Information:     Housing/Transportation Living arrangements for the past 2 months:  [place] of Information:  Adult Children, Facility Patient Interpreter Needed:  None Criminal Activity/Legal Involvement Pertinent to Current Situation/Hospitalization:  No - Comment as needed Significant Relationships:  Adult Children Lives with:  Facility Resident Do you feel safe going back to the place where you live?  Yes Need for family participation in patient care:  Yes (Comment)  Care giving concerns:  Family is concerned about frequent hospital admissions.   Social Worker assessment / plan:  Pt hospitalized on 01/01/2015 with septic shock. Pt hospitalized last month, as well. Pt is a long term care resident from [facility]. CSW consulted to assist with d/c planning. CSW met with pt's family at bedside. Pt sleeping during visit. Family has requested Palliative Care consult. CSW will review consult notes, once available, to assist with d/c recommendations.   Employment status:  Retired Nurse, adult, Medicaid In [place] PT Recommendations:  Not assessed at this time [place] / Referral to community resources:  Other (Comment Required) (Not required at this time)  Patient/Family's Response to care:  Family has requested palliative Care consult.  Patient/Family's Understanding of and Emotional Response to Diagnosis, Current Treatment, and Prognosis:  Pt sleeping at time of visit. Family is aware of pt's medical status. They are  looking forward to meeting with Palliative Care Team.  Emotional Assessment Appearance:  Appears stated age Attitude/Demeanor/Rapport:  Other (cooperative) Affect (typically observed):  Other (Nsg reports pt is anxious at times.) Orientation:  Oriented to Self, Oriented to Place, Oriented to  Time Alcohol / Substance use:  Not Applicable Psych involvement (Current and /or in the community):  No (Comment)  Discharge Needs  Concerns to be addressed:  Discharge Planning Concerns Readmission within the last 30 days:  Yes Current discharge risk:  None Barriers to Discharge:  No Barriers Identified   Luretha Rued, [place] 12/26/2014, 4:06 PM

## 2014-12-26 NOTE — Progress Notes (Signed)
ANTIBIOTIC CONSULT NOTE - initial  Pharmacy Consult for zosyn Indication: UTI> pseudomonas  Allergies  Allergen Reactions  . Celebrex [Celecoxib]   . Nsaids   . Sulfa Antibiotics     Patient Measurements: Height: 5\' 10"  (177.8 cm) Weight: 290 lb 2 oz (131.6 kg) IBW/kg (Calculated) : 68.5  Vital Signs: Temp: 98 F (36.7 C) (10/20 0800) Temp Source: Axillary (10/20 0800) BP: 65/46 mmHg (10/20 1030) Pulse Rate: 97 (10/20 1030) Intake/Output from previous day: 10/19 0701 - 10/20 0700 In: 5384.1 [P.O.:60; I.V.:4374.1; IV Piggyback:950] Out: 0  Intake/Output from this shift: Total I/O In: 335.2 [I.V.:235.2; IV Piggyback:100] Out: 8 [Urine:8]  Labs:  Recent Labs  12/24/14 0545 12/25/14 0545 12/26/14 0330  WBC 33.6* 38.9* 30.8*  HGB 9.2* 8.8* 8.4*  PLT 181 131* 82*  CREATININE 2.35* 2.28* 2.18*   Estimated Creatinine Clearance: 31 mL/min (by C-G formula based on Cr of 2.18).  Recent Labs  12/25/14 1754  [place] 18     Microbiology: Recent Results (from the past 720 hour(s))  Urine culture     Status: None   Collection Time: 11/30/14 10:30 PM  Result Value Ref Range Status   Specimen Description URINE, CATHETERIZED  Final   Special Requests NONE  Final   Culture   Final    >=100,000 COLONIES/mL ESCHERICHIA COLI Confirmed Extended Spectrum Beta-Lactamase Producer (ESBL) Performed at [facility]    Report Status 12/04/2014 FINAL  Final   Organism ID, Bacteria ESCHERICHIA COLI  Final      Susceptibility   Escherichia coli - MIC*    AMPICILLIN >=32 RESISTANT Resistant     CEFAZOLIN >=64 RESISTANT Resistant     CEFTRIAXONE >=64 RESISTANT Resistant     CIPROFLOXACIN >=4 RESISTANT Resistant     GENTAMICIN >=16 RESISTANT Resistant     IMIPENEM <=0.25 SENSITIVE Sensitive     NITROFURANTOIN <=16 SENSITIVE Sensitive     TRIMETH/SULFA <=20 SENSITIVE Sensitive     AMPICILLIN/SULBACTAM >=32 RESISTANT Resistant     PIP/TAZO >=128 RESISTANT Resistant      * >=100,000 COLONIES/mL ESCHERICHIA COLI  Blood culture (routine x 2)     Status: None   Collection Time: 12/04/14  6:00 PM  Result Value Ref Range Status   Specimen Description BLOOD LEFT HAND  Final   Special Requests BOTTLES DRAWN AEROBIC AND ANAEROBIC 5CC EACH  Final   Culture   Final    NO GROWTH 5 DAYS Performed at [facility]    Report Status 12/09/2014 FINAL  Final  Blood culture (routine x 2)     Status: None   Collection Time: 12/04/14  6:00 PM  Result Value Ref Range Status   Specimen Description BLOOD RIGHT HAND  Final   Special Requests BOTTLES DRAWN AEROBIC AND ANAEROBIC 5CC EACH  Final   Culture   Final    NO GROWTH 5 DAYS Performed at [facility]    Report Status 12/09/2014 FINAL  Final  MRSA PCR Screening     Status: Abnormal   Collection Time: 12/04/14 11:16 PM  Result Value Ref Range Status   MRSA by PCR POSITIVE (A) NEGATIVE Final    Comment:        The GeneXpert MRSA Assay (FDA approved for NASAL specimens only), is one component of a comprehensive MRSA colonization surveillance program. It is not intended to diagnose MRSA infection nor to guide or monitor treatment for MRSA infections. RESULT CALLED TO, READ BACK BY AND VERIFIED WITH: NI,A/2W@0315  ON 12/05/14  BY KARCZEWSKI,S.   Urine culture     Status: None   Collection Time: 12/06/14 11:48 PM  Result Value Ref Range Status   Specimen Description URINE, CATHETERIZED  Final   Special Requests NONE  Final   Culture   Final    NO GROWTH 1 DAY Performed at [facility]    Report Status 12/08/2014 FINAL  Final  C difficile quick scan w PCR reflex     Status: Abnormal   Collection Time: 12/07/14  6:00 PM  Result Value Ref Range Status   C Diff antigen POSITIVE (A) NEGATIVE Final   C Diff toxin NEGATIVE NEGATIVE Final   C Diff interpretation   Final    C. difficile present, but toxin not detected. This indicates colonization. In most cases, this does not require  treatment. If patient has signs and symptoms consistent with colitis, consider treatment. Requires ENTERIC precautions.  Blood Culture (routine x 2)     Status: None (Preliminary result)   Collection Time: 12/25/2014  6:52 PM  Result Value Ref Range Status   Specimen Description BLOOD LEFT FOREARM  Final   Special Requests BOTTLES DRAWN AEROBIC ONLY 5ML  Final   Culture  Setup Time   Final    GRAM NEGATIVE RODS AEROBIC BOTTLE ONLY CRITICAL RESULT CALLED TO, READ BACK BY AND VERIFIED WITH: J DUNN 12/24/14 @ 0934 M VESTAL    Culture   Final    GRAM NEGATIVE RODS Performed at [facility]    Report Status PENDING  Incomplete  Blood Culture (routine x 2)     Status: None (Preliminary result)   Collection Time: 12/21/2014  6:59 PM  Result Value Ref Range Status   Specimen Description BLOOD RIGHT ANTECUBITAL  Final   Special Requests   Final    BOTTLES DRAWN AEROBIC AND ANAEROBIC 10CC AER,5CC ANA   Culture  Setup Time   Final    GRAM NEGATIVE RODS IN BOTH AEROBIC AND ANAEROBIC BOTTLES CRITICAL RESULT CALLED TO, READ BACK BY AND VERIFIED WITH: J DUNN,RN AT 5366 12/24/14 BY L BENFIELD    Culture   Final    GRAM NEGATIVE RODS Performed at [facility]    Report Status PENDING  Incomplete  Urine culture     Status: None   Collection Time: 12/24/14 12:03 AM  Result Value Ref Range Status   Specimen Description URINE, CATHETERIZED  Final   Special Requests NONE  Final   Culture   Final    >=100,000 COLONIES/mL PSEUDOMONAS AERUGINOSA Performed at [facility]    Report Status 12/26/2014 FINAL  Final   Organism ID, Bacteria PSEUDOMONAS AERUGINOSA  Final      Susceptibility   Pseudomonas aeruginosa - MIC*    CEFTAZIDIME 4 SENSITIVE Sensitive     CIPROFLOXACIN >=4 RESISTANT Resistant     GENTAMICIN <=1 SENSITIVE Sensitive     IMIPENEM >=16 RESISTANT Resistant     PIP/TAZO 8 SENSITIVE Sensitive     CEFEPIME 2 SENSITIVE Sensitive     * >=100,000 COLONIES/mL  PSEUDOMONAS AERUGINOSA    Medical History: Past Medical History  Diagnosis Date  . Osteoporosis   . HTN (hypertension)     unspecified  . Atrial fibrillation ([place])   . Macular degeneration   . Osteoarthritis   . Edema   . Anemia   . Hydronephrosis     left  . UTI (lower urinary tract infection)   . Pressure ulcer of back   . DM (  diabetes mellitus) ([place])     lantus  . Depression   . Insomnia   . GERD (gastroesophageal reflux disease)   . CKD (chronic kidney disease), stage III   . Chronic diastolic CHF (congestive heart failure) (HCC)     Assessment: Patient's a 81 y.o F with hx ESBL UTI who was recently hospitalized earlier this month for sepsis. She  presented to [facility] ED from [facility] on 10/17 with c/o vaginal swelling.  In the ED, she was found to be febrile, hypotensive, and have elevated LA and scr.  Zosyn for pseudomonas UTI.  BCx2 10/17 >> GNR >>  UCx 10/17 >> greater 100k pseudomonas >> resistant to imipenem  10/17 >>vanc Rx>> 10/20 10/17 >>primaxin>> 10/20  Goal of Therapy:  Eradication of infection  Plan:  - Zosyn 3.375g IV Q8H infused over 4hrs. -d/c vancomycin and primaxin -follow renal function  Dolly Rias RPh 12/26/2014, 11:21 AM Pager [phone]

## 2014-12-26 NOTE — Progress Notes (Signed)
[facility] Progress Note Patient Name: Kathryn [name] DOB: [birth year] MRN: [id number]   Date of Service  12/26/2014  HPI/Events of Note  Some improvement with increased fentanyl. Still some moaning. Note last CVP was 16, NS running at 125cc/h. Will KVO her IVF pending the morning CVP. Continue fentanyl as needed.   eICU Interventions       Intervention Category Minor Interventions: Other:  BYRUM,ROBERT S. 12/26/2014, 3:33 AM

## 2014-12-26 NOTE — Progress Notes (Signed)
PULMONARY / CRITICAL CARE MEDICINE   Name: Kathryn [name] MRN: [id number] DOB: [birth year]    ADMISSION DATE:  01/03/2015 CONSULTATION DATE:  12/29/2014  REFERRING MD :  EDP  CHIEF COMPLAINT:  Hypotension  INITIAL PRESENTATION:  81 y.o. F from nursing home, brought to [facility] ED 10/17 after staff unable to place foley due to vaginal swelling.  In ED, found to be in shock, with concern for UTI.  PCCM called for admission.    STUDIES:  CXR 10/17 >> bibasilar atx, chronic elevation of right hemidiaphragm. ECHO 10/18 >> LV nml size, concentric hypertrophy, EF 40-45%, diffuse hypokinesis, unable to assess for diastolic dysfunction, nml PA-S pressure ->mild biventricular dysfunction 10/19 RUQ ultrasound> no biliary ductal dilation  SIGNIFICANT EVENTS: 10/17 - admitted with shock, septic vs hypovolemic,    SUBJECTIVE:  RN reports pt with ongoing nausea, spitting up gastric secretions.  Pt denies chest pain, abdominal pain, current nausea.  Afebrile, WBC increased to 38k.  CVP 16  VITAL SIGNS: Temp:  [98 F (36.7 C)-98.6 F (37 C)] 98.5 F (36.9 C) (10/20 0435) Pulse Rate:  [78-108] 90 (10/20 0815) Resp:  [16-31] 17 (10/20 0815) BP: (87-123)/(27-88) 121/76 mmHg (10/20 0815) SpO2:  [87 %-100 %] 93 % (10/20 0815) Arterial Line BP: (85-119)/(50-114) 118/59 mmHg (10/19 1800) Weight:  [131.6 kg (290 lb 2 oz)] 131.6 kg (290 lb 2 oz) (10/20 0317)   HEMODYNAMICS: CVP:  [16 mmHg] 16 mmHg  VENTILATOR SETTINGS:     INTAKE / OUTPUT: Intake/Output      10/19 0701 - 10/20 0700 10/20 0701 - 10/21 0700   P.O. 60    I.V. (mL/kg) 4374.1 (33.2) 62.8 (0.5)   IV Piggyback 950    Total Intake(mL/kg) 5384.1 (40.9) 62.8 (0.5)   Urine (mL/kg/hr) 0 (0) 8 (0)   Total Output 0 8   Net +5384.1 +54.8          PHYSICAL EXAMINATION: General: Chronically ill appearing, calling out in pain HENT: NCAT, mucus membranes dry PULM: normal respiratory effort, clear to auscultation CV: Tachy, irregular, no  murmur, cool extremities GI: BS+, soft, nontender Derm: decreased skin turgor, edema BLE, pale; R hand dusky, cold Neuro: awake, calling out in pain, moves all four ext, oriented to hospital  LABS:  CBC  Recent Labs Lab 12/24/14 0545 12/25/14 0545 12/26/14 0330  WBC 33.6* 38.9* 30.8*  HGB 9.2* 8.8* 8.4*  HCT 28.0* 25.8* 24.5*  PLT 181 131* 82*   Coag's No results for input(s): APTT, INR in the last 168 hours.   BMET  Recent Labs Lab 12/24/14 0545 12/25/14 0545 12/26/14 0330  NA 135 131* 132*  K 3.2* 3.6 3.1*  CL 107 104 107  CO2 16* 15* 16*  BUN 32* 33* 36*  CREATININE 2.35* 2.28* 2.18*  GLUCOSE 248* 205* 131*   Electrolytes  Recent Labs Lab 12/24/14 0545 12/25/14 0545 12/26/14 0330  CALCIUM 7.3* 6.7* 6.5*  MG 1.0* 1.4* 1.7  PHOS 3.0  --   --    Sepsis Markers  Recent Labs Lab 12/25/2014 2230 12/24/14 0035 12/24/14 0545 12/25/14 0545 12/25/14 1350  LATICACIDVEN 7.5* 7.9* 5.7*  --  2.4*  PROCALCITON 31.60  --  76.88 69.90  --    ABG No results for input(s): PHART, PCO2ART, PO2ART in the last 168 hours.   Liver Enzymes  Recent Labs Lab 12/08/2014 1852 12/25/14 1230  AST 63* 98*  ALT 37 41  ALKPHOS 269* 218*  BILITOT 1.6* 1.4*  ALBUMIN 2.3* 1.8*  Cardiac Enzymes  Recent Labs Lab 12/13/2014 2230 12/24/14 0545  TROPONINI 0.44* 1.61*   Glucose  Recent Labs Lab 12/25/14 0806 12/25/14 1308 12/25/14 1621 12/25/14 2308 12/26/14 0430 12/26/14 0434  GLUCAP 157* 151* 145* 116* 14* 99    Imaging  10/17 CXR images personally reviewed> R IJ CVL in place, bilateral mild scattered air space disease   ASSESSMENT / PLAN:  CARDIOVASCULAR CVL R IJ 10/17 >>> EKG 10/17 > Afib, non-specific ST wave changes lateral leads A:  Septic shock - secondary to UTI, no other source identified Afib, not in RVR History of hypertension Demand ischemia due to sepsis DO NOT RESUSCITATE R hand ischemia> due to high vasopressors and arterial line P:   Norepinephrine as needed to maintain goal MAP > 65 Vasopressin > d/c today D/c arterial line, keep hand warm Continue Stress dose steroids Hold outpatient amlodipine, furosemide, lopressor  INFECTIOUS A:   Septic shock due to UTI with bacteremia, no other source identifiied GNR Bacteremia  Pressure ulcers of back Hx ESBL UTI P:   BCx2 10/17 >> GNR >>  UCx 10/17 >> greater 100k pseudomonas >> imipenem resistent  Abx: Vanc, start date 10/17, day 3/x Abx: Imipenem, start date 10/17, day 4/x; stop 10/20 Start Zosyn 10/20  Wound care consult   PULMONARY A: Bibasilar atelectasis Chronically elevated right hemidiaphragm DO NOT INTUBATE P:   Incentive spirometry DuoNebs CXR intermittently Oxygen as needed to support sats > 92%  RENAL A:   Chronic kidney disease, Cr appears at baseline but she is oliguric AG metabolic acidosis - lactate > improving Hypocalcemia Hypomagnesemia  P:   NS @ 75 Monitor BMET and UOP Replace electrolytes as needed, Mg 10/19 Not an HD candidate  GASTROINTESTINAL A:   GERD Nutrition Recent history of elevated LFTs, improving here; no belly pain on my exam to suggest biliary pathology S/p cholecystecomy P:   Pantoprazole Diet > clears as tolerated Repeat LFT in AM PRN zofran  HEMATOLOGIC A:   Macrocytic Anemia of chronic disease VTE Prophylaxis P:  Transfuse for Hgb < 7 SCD's / Heparin CBC in AM  ENDOCRINE A:   DM 2 P:   SSI  NEUROLOGIC A:   Hx Depression P:   Hold outpatient gabapentin  Family updated: No family available am 10/19.  Updated via phone per Dr. Lake Bells 10/18  Interdisciplinary Family Meeting v Lithium Meeting:  Lengthy conversation held today with the patient's son, and her granddaughter who is a Marine scientist by phone. I explained to them that she is clearly suffering and is not making significant progress. Her vasopressor requirement has improved somewhat but she remains oliguric. What is really  worrisome is her severe agitation and encephalopathy and pain. They relate a long history of worsening medical condition, functional status, and overall quality of life. After lengthy conversation we decided that we need to withdraw care. They are going to have multiple family members come to visit today. I'm going to start a low-dose morphine infusion and instruct the nurses not to titrate the norepinephrine up.   CC time 60 minutes   Roselie Awkward, MD [place] [facility] Pager: [phone] Cell: [phone] After 3pm or if no response, call [phone]

## 2014-12-27 DIAGNOSIS — Z515 Encounter for palliative care: Secondary | ICD-10-CM

## 2014-12-27 DIAGNOSIS — R451 Restlessness and agitation: Secondary | ICD-10-CM

## 2014-12-27 LAB — CULTURE, BLOOD (ROUTINE X 2)

## 2014-12-27 LAB — GLUCOSE, CAPILLARY: Glucose-Capillary: 142 mg/dL — ABNORMAL HIGH (ref 65–99)

## 2014-12-27 MED ORDER — ACETAMINOPHEN 325 MG PO TABS: 650.0000 mg | ORAL_TABLET | Freq: Four times a day (QID) | ORAL | Status: DC | PRN

## 2014-12-27 MED ORDER — SODIUM CHLORIDE 0.9 % IV SOLN
4.0000 mg/h | INTRAVENOUS | Status: DC
  Filled 2014-12-27: qty 10

## 2014-12-27 MED ORDER — BIOTENE DRY MOUTH MT LIQD: 15.0000 mL | OROMUCOSAL | Status: DC | PRN

## 2014-12-27 MED ORDER — POLYVINYL ALCOHOL 1.4 % OP SOLN
1.0000 [drp] | Freq: Four times a day (QID) | OPHTHALMIC | Status: DC | PRN
  Administered 2014-12-27: 1 [drp] via OPHTHALMIC
  Filled 2014-12-27: qty 15

## 2014-12-27 MED ORDER — ACETAMINOPHEN 650 MG RE SUPP: 650.0000 mg | Freq: Four times a day (QID) | RECTAL | Status: DC | PRN

## 2014-12-27 MED ORDER — ONDANSETRON HCL 4 MG/2ML IJ SOLN: 4.0000 mg | Freq: Four times a day (QID) | INTRAMUSCULAR | Status: DC | PRN

## 2014-12-27 MED ORDER — ONDANSETRON 4 MG PO TBDP
4.0000 mg | ORAL_TABLET | Freq: Four times a day (QID) | ORAL | Status: DC | PRN
  Filled 2014-12-27: qty 1

## 2014-12-27 MED ORDER — HYDROCORTISONE NA SUCCINATE PF 100 MG IJ SOLR: 50.0000 mg | Freq: Two times a day (BID) | INTRAMUSCULAR | Status: DC

## 2014-12-30 LAB — GLUCOSE, CAPILLARY: GLUCOSE-CAPILLARY: 14 mg/dL — AB (ref 65–99)

## 2015-01-01 ENCOUNTER — Telehealth: Payer: Self-pay

## 2015-01-01 NOTE — Telephone Encounter (Signed)
On 01/01/2015 I received a death certificate from [facility]. The death certificate is for burial. The patient is a patient of Doctor McQuaid. The death certificate will be taken to the [place] office at [facility] tomorrow am for signature. On Jan 08, 2015 I received the death certificate back from Doctor McQuaid. I got the death certificate ready and called the funeral home to let them know I was mailing the death certificate to the [facility] per their request.

## 2015-01-06 ENCOUNTER — Encounter: Payer: Self-pay | Admitting: Nurse Practitioner

## 2015-01-06 NOTE — Progress Notes (Signed)
Patient ID: JAMILE REKOWSKI, female   DOB: 11/06/1933, 81 y.o.   MRN: [id number]  This note is intended to be for 12/06/14.   Pt was incontinent of urine when she was admitted on 9/28. Attempts to place catheter by ED staff was unsuccessful. Her admitting physicians requested urology place the catheter with no further consultation needed.  At the patient's bedside, she was noted to be alert and could follow him my commands.The patient was prepped using sterile technique. Using a 56 French Foley catheter, i successfully cannulated the urethra noting clear yellow urine return into the tubing.  I then advanced the catheter to tilt and inflated the balloon with 10 cc of sterile water.  The sterile closed loop fluid collection system was then properly secured to the patient and bedside.   The patient tolerated the procedure without problem. Total time at patient's bedside: 15 min.

## 2015-01-07 NOTE — Progress Notes (Signed)
PULMONARY / CRITICAL CARE MEDICINE   Name: Kathryn [name] MRN: [id number] DOB: [birth year]    ADMISSION DATE:  12/13/2014 CONSULTATION DATE:  12/19/2014  REFERRING MD :  EDP  CHIEF COMPLAINT:  Hypotension  INITIAL PRESENTATION:  81 y.o. F from nursing home, brought to [facility] ED 10/17 after staff unable to place foley due to vaginal swelling.  In ED, found to be in shock, with concern for UTI.  She developed progressive shock requiring vasopressors in the setting of E-Coli & Pseudomonas bacteremia and pseudomonas UTI.  The patient had continuous complaints of non-specific pain.  Given prior history of elevated LFT's a RUQ ultrasound was assessed and showed no biliary ductal dilation. The patient continue to have progressive organ dysfunction (renal failure, hypotension) despite aggressive therapies and was transitioned 10/21 to comfort care.    STUDIES:  CXR 10/17 >> bibasilar atx, chronic elevation of right hemidiaphragm. ECHO 10/18 >> LV nml size, concentric hypertrophy, EF 40-45%, diffuse hypokinesis, unable to assess for diastolic dysfunction, nml PA-S pressure ->mild biventricular dysfunction 10/19 RUQ ultrasound >> no biliary ductal dilation  SIGNIFICANT EVENTS: 10/17  Admitted with shock, septic vs hypovolemic 10/20  Palliative care consulted for assistance with EOL transition  10/21  Family requested to progress toward full comfort measures  SUBJECTIVE:  RN reports family at bedside.  Patient remains on levophed and morphine gtt.  Pending all family arrival, they wish to withdrawal further aggressive measures.   VITAL SIGNS: Temp:  [96.6 F (35.9 C)-97.6 F (36.4 C)] 97.5 F (36.4 C) (10/21 0400) Pulse Rate:  [64-123] 64 (10/21 1100) Resp:  [10-28] 14 (10/21 1100) BP: (59-109)/(34-76) 62/40 mmHg (10/21 1100) SpO2:  [93 %-100 %] 98 % (10/21 1100)   INTAKE / OUTPUT: Intake/Output      10/20 0701 - 10/21 0700 10/21 0701 - 10/22 0700   P.O.     I.V. (mL/kg) 2779.6 (21.1) 241.6  (1.8)   IV Piggyback 250    Total Intake(mL/kg) 3029.6 (23) 241.6 (1.8)   Urine (mL/kg/hr) 23 (0)    Total Output 23     Net +3006.6 +241.6          PHYSICAL EXAMINATION: General: Chronically ill appearing, appears more comfortable on morphine gtt. Occasional moaning  HENT: NCAT, mucus membranes dry PULM: shallow respirations CV: Tachy, irregular, no murmur, cool extremities GI: BS+, soft, nontender Derm: decreased skin turgor, edema BLE, pale; R hand dusky, cold Neuro: obtunded, occasional moaning but appears more comfortable   LABS:  CBC  Recent Labs Lab 12/24/14 0545 12/25/14 0545 12/26/14 0330  WBC 33.6* 38.9* 30.8*  HGB 9.2* 8.8* 8.4*  HCT 28.0* 25.8* 24.5*  PLT 181 131* 82*   Coag's No results for input(s): APTT, INR in the last 168 hours.   BMET  Recent Labs Lab 12/24/14 0545 12/25/14 0545 12/26/14 0330  NA 135 131* 132*  K 3.2* 3.6 3.1*  CL 107 104 107  CO2 16* 15* 16*  BUN 32* 33* 36*  CREATININE 2.35* 2.28* 2.18*  GLUCOSE 248* 205* 131*   Electrolytes  Recent Labs Lab 12/24/14 0545 12/25/14 0545 12/26/14 0330  CALCIUM 7.3* 6.7* 6.5*  MG 1.0* 1.4* 1.7  PHOS 3.0  --   --    Sepsis Markers  Recent Labs Lab 12/18/2014 2230 12/24/14 0035 12/24/14 0545 12/25/14 0545 12/25/14 1350  LATICACIDVEN 7.5* 7.9* 5.7*  --  2.4*  PROCALCITON 31.60  --  76.88 69.90  --    ABG No results for input(s): PHART, PCO2ART,  PO2ART in the last 168 hours.   Liver Enzymes  Recent Labs Lab 12/14/2014 1852 12/25/14 1230  AST 63* 98*  ALT 37 41  ALKPHOS 269* 218*  BILITOT 1.6* 1.4*  ALBUMIN 2.3* 1.8*   Cardiac Enzymes  Recent Labs Lab 12/25/2014 2230 12/24/14 0545  TROPONINI 0.44* 1.61*   Glucose  Recent Labs Lab 12/25/14 2308 12/26/14 0430 12/26/14 0434 12/26/14 0846 12/26/14 1312 12/26/14 1647  GLUCAP 116* 14* 99 126* 97 127*    Imaging  10/17  CXR images personally reviewed> R IJ CVL in place, bilateral mild scattered air space  disease   ASSESSMENT / PLAN:  CARDIOVASCULAR CVL R IJ 10/17 >>> EKG 10/17 > Afib, non-specific ST wave changes lateral leads A:  Septic shock - secondary to UTI, no other source identified Afib, not in RVR History of hypertension Demand ischemia due to sepsis DO NOT RESUSCITATE R hand ischemia> due to high vasopressors and arterial line P:  Wean levophed gtt to off  Comfort measures only   INFECTIOUS A:   Septic shock due to UTI with bacteremia, no other source identifiied GNR Bacteremia  Pressure ulcers of back Hx ESBL UTI P:   BCx2 10/17 >> GNR >>  UCx 10/17 >> greater 100k pseudomonas >> imipenem resistent  Abx: Vanc, start date 10/17, day 3/x Abx: Imipenem, start date 10/17, day 4/x; stop 10/20 Start Zosyn 10/20  D/c abx 10/21   PULMONARY A: Bibasilar atelectasis Chronically elevated right hemidiaphragm DO NOT INTUBATE P:   Oxygen for comfort @ 2L  RENAL A:   Chronic kidney disease, Cr appears at baseline but she is oliguric AG metabolic acidosis - lactate > improving Hypocalcemia Hypomagnesemia  P:   No further lab draws   GASTROINTESTINAL A:   GERD Nutrition Recent history of elevated LFTs, improving here; no belly pain on my exam to suggest biliary pathology S/p cholecystecomy P:   Oral care for comfort   HEMATOLOGIC A:   Macrocytic Anemia of chronic disease VTE Prophylaxis P:  No further lab draws  ENDOCRINE A:   DM 2 P:   D/C SSI   NEUROLOGIC A:   Hx Depression Pain P:   Morphine gtt for comfort   Family updated: Family updated at bedside 10/21.   Interdisciplinary Family Meeting v Palliative Care Meeting:   Family meeting with Dr. Rowe Pavy & PCCM NP 10/21 - d/c further lab draws, levophed, amiodarone.  Comfort measures only.    Noe Gens, NP-C [facility] Pgr: [phone] or if no answer [phone] 2015/01/07, 11:13 AM

## 2015-01-07 NOTE — Progress Notes (Signed)
[id number] Hayven Fatima,RN,BSN, CCM: Iv presoors continue until family can arrive and then care to be with drawn.  Death is excepted in house.

## 2015-01-07 NOTE — Discharge Summary (Signed)
PULMONARY / CRITICAL CARE MEDICINE DEATH NOTE   Name: Kathryn [name] MRN: [id number] DOB: [birth year]    ADMISSION DATE:  2015/01/14 DATE OF DEATH:  2015-01-18  CAUSE OF DEATH :   1) Septic shock,  2) UTI 3) E Coli (ESBL) bacteremia and Pseudomonas bacteremia   Hospital Course:  81 y.o. F from nursing home, brought to [facility] ED January 14, 2023 after staff unable to place foley due to vaginal swelling.  In ED, found to be in shock, with concern for UTI.  She developed progressive shock requiring vasopressors in the setting of E-Coli & Pseudomonas bacteremia and pseudomonas UTI.  The patient had continuous complaints of non-specific pain.  Given prior history of elevated LFT's a RUQ ultrasound was assessed and showed no biliary ductal dilation. The patient continue to have progressive organ dysfunction (renal failure, hypotension) despite aggressive therapies and was transitioned Jan 18, 2023 to comfort care.  She died peacefully on 01-18-2015 with her family at the bedside.   STUDIES:  CXR Jan 14, 2023 >> bibasilar atx, chronic elevation of right hemidiaphragm. ECHO 10/18 >> LV nml size, concentric hypertrophy, EF 40-45%, diffuse hypokinesis, unable to assess for diastolic dysfunction, nml PA-S pressure ->mild biventricular dysfunction 10/19 RUQ ultrasound >> no biliary ductal dilation  SIGNIFICANT EVENTS: 2023/01/14  Admitted with shock, septic vs hypovolemic 10/20  Palliative care consulted for assistance with EOL transition  2023/01/18  Family requested to progress toward full comfort measures   Roselie Awkward, MD [place] [facility] Pager: [phone] Cell: [phone] After 3pm or if no response, call [phone]

## 2015-01-07 NOTE — Progress Notes (Signed)
Daily Progress Note   Patient Name: Kathryn [name]       Date: 15-Jan-2015 DOB: 1933-11-06  Age: 81 y.o. MRN#: [id number] Attending Physician: Chesley Mires, MD Primary Care Physician: Blanchie Serve, MD Admit Date: 12/20/2014  Reason for Consultation/Follow-up: Terminal care   Life limiting illness: septic shock, UTI, bacteremia in an elderly lady with chronic decline, multiple underlying chronic illnesses  Subjective:  moaning, frowning, in mild distress, non verbal  Interval Events: Palliative consult note reviewed Discussed with Noe Gens with pulm/ccm  Patient seen with Velna Hatchet, met with son and various other family members including trauma RN grand daughter present in the room.  Discussed scope of comfort measures only Discussed up titration of morphine drip Discussed D/C pressors, amiodarone and antibiotics Prognosis minutes to hours discussed   All questions answered. Family states they are at peace with the patient's impending hospital death. Patient had apparently told her minister 1 week ago that she was dying and that she was ready to meet her parents in 60 soon.   Length of Stay: 4 days  Current Medications: Scheduled Meds:  . antiseptic oral rinse  7 mL Mouth Rinse q12n4p  . chlorhexidine  15 mL Mouth Rinse BID  . hydrocortisone sodium succinate  50 mg Intravenous Q12H  . ondansetron (ZOFRAN) IV  4 mg Intravenous 3 times per day  . pantoprazole (PROTONIX) IV  40 mg Intravenous Q24H    Continuous Infusions: . morphine 1 mg/hr (15-Jan-2015 0900)    PRN Meds: sodium chloride, acetaminophen **OR** acetaminophen, antiseptic oral rinse, glycopyrrolate, haloperidol lactate, morphine, ondansetron **OR** ondansetron (ZOFRAN) IV, polyvinyl alcohol  Physical  Exam: Physical Exam  Constitutional:  Pale edematous non verbal  Pulmonary/Chest:  Shallow breathing   Abdominal: She exhibits distension.  Musculoskeletal: She exhibits edema.                Vital Signs: BP 62/40 mmHg  Pulse 64  Temp(Src) 97.5 F (36.4 C) (Axillary)  Resp 14  Ht _0  (1.778 m)  Wt 131.6 kg (290 lb 2 oz)  BMI 41.63 kg/m2  SpO2 98% SpO2: SpO2: 98 % O2 Device: O2 Device: Not Delivered O2 Flow Rate: O2 Flow Rate (L/min): 2 L/min  Intake/output summary:  Intake/Output Summary (Last 24 hours) at 01/15/15 1137 Last data filed at January 15, 2015 0900  Gross per 24 hour  Intake 2789.35 ml  Output     15 ml  Net 2774.35 ml   LBM:   Baseline Weight: Weight: 107.049 kg (236 lb) Most recent weight: Weight: 131.6 kg (290 lb 2 oz)       Palliative Assessment/Data: Flowsheet Rows        Most Recent Value   Intake Tab    Referral Department  Critical care   Unit at Time of Referral  ICU   Palliative Care Primary Diagnosis  Cardiac   Date Notified  12/26/14   Palliative Care Type  New Palliative care   Reason for referral  End of Life Care Assistance   Date of Admission  12/30/2014   Date first seen by Palliative Care  12/26/14   # of days Palliative referral response time  0 Day(s)   # of days IP prior to Palliative referral  3   Clinical Assessment    Psychosocial & Spiritual Assessment    Palliative Care Outcomes       Additional Data Reviewed: Recent Labs     12/25/14  0545  12/26/14  0330  WBC  38.9*  30.8*  HGB  8.8*  8.4*  PLT  131*  82*  NA  131*  132*  BUN  33*  36*  CREATININE  2.28*  2.18*     Problem List:  Patient Active Problem List   Diagnosis Date Noted  . Palliative care encounter 12/26/2014  . Agitation 12/26/2014  . Septic shock ([place]) 12/25/2014  . CKD (chronic kidney disease)   . Permanent atrial fibrillation ([place])   . Altered mental status   . Metabolic acidosis, increased anion gap (IAG)   . Bile duct stone   .  Choledocholithiasis 12/07/2014  . Acute encephalopathy 12/05/2014  . Decubitus ulcer of ankle, stage 3 ([place]) 12/05/2014  . GI hemorrhage 12/05/2014  . Abnormal LFTs   . Sepsis ([place]) 12/04/2014  . UTI (lower urinary tract infection) 12/04/2014  . UGI bleed 12/04/2014  . Nausea & vomiting 12/04/2014  . Chronic diastolic CHF (congestive heart failure) ([place])   . Intractable cyclical vomiting with nausea   . Major depression, chronic ([place]) 10/14/2014  . Constipation, chronic 09/13/2014  . Age related osteoporosis 09/13/2014  . CHF, stage C ([place]) 08/21/2014  . Hypertensive heart disease with heart failure ([place]) 08/21/2014  . Therapeutic opioid induced constipation 06/25/2014  . Primary generalized (osteo)arthritis 06/25/2014  . Protein-calorie malnutrition ([place]) 06/05/2014  . Pressure ulcer, stage 3 ([place]) 06/05/2014  . Benign hypertensive heart disease without heart failure 06/05/2014  . Type 2 diabetes mellitus with diabetic polyneuropathy ([place]) 06/05/2014  . Rhinitis, allergic 06/05/2014  . Acute upper respiratory infection 04/16/2014  . Acute renal failure superimposed on stage 3 chronic kidney disease ([place]) 03/14/2014  . Absence of bladder continence 03/14/2014  . Depression 03/14/2014  . Neuropathic pain 03/14/2014  . DM (diabetes mellitus), type 2 with renal complications ([place]) 76/81/1572  . Other specified disease of white blood cells 11/02/2012  . Anemia, chronic disease 10/05/2012  . GERD (gastroesophageal reflux disease) 10/05/2012  . Constipation 10/05/2012  . Urinary incontinence 10/05/2012  . Insomnia 10/05/2012  . Obtunded 05/15/2011  . Altered mental state 05/15/2011  . Renal insufficiency 04/20/2011  . Hyponatremia 04/20/2011  . Obesity 04/20/2011  . EDEMA 06/14/2008  . HYPERCHOLESTEROLEMIA  IIA 06/12/2008  . Macular degeneration (senile) of retina 06/12/2008  . Essential hypertension 06/12/2008  . Atrial fibrillation ([place]) 06/12/2008  . OSTEOARTHRITIS  06/12/2008  . Osteoporosis 06/12/2008    PPS 10% Palliative Care Assessment & Plan    1.Code Status:  DNR    Code Status Orders        Start     Ordered   12/29/14 1134  Do not attempt resuscitation (DNR)   Continuous    Question Answer Comment  In the event of cardiac or respiratory ARREST Do not call a "code blue"   In the event of cardiac or respiratory ARREST Do not perform Intubation, CPR, defibrillation or ACLS   In the event of cardiac or respiratory ARREST Use medication by any route, position, wound care, and other measures to relive pain and suffering. May use oxygen, suction and manual treatment of airway obstruction as needed for comfort.      2014/12/29 1134    Advance Directive Documentation        Most Recent Value   Type of Advance Directive  Healthcare Power of Attorney   Pre-existing out of facility DNR order (yellow form or pink MOST form)     "MOST" Form in Place?         2. Goals of Care:   comfort care only  Limitations on Scope of Treatment: Full Comfort Care  Desire for further Chaplaincy support:no  Psycho-social Needs: Grief/Bereavement Support  3. Symptom Management:      1. Morphine infusion  4. Palliative Prophylaxis:   Bowel Regimen  5. Prognosis: Hours - Days  6. Discharge Planning:  hospital death   Care plan was discussed with  Patient, Brandi from [place], son, various other family members.   Thank you for allowing the Palliative Medicine Team to assist in the care of this patient.   Time In: 1100 Time Out: 1135 Total Time 35 Prolonged Time Billed  no         Loistine Chance, MD  12/29/2014, 11:37 AM  Please contact Palliative Medicine Team phone at [phone] for questions and concerns.

## 2015-01-07 NOTE — Progress Notes (Signed)
Morphine 190 mls wasted. Verified with Meridith RN. A full bag of morphine was returned to pharmacy.

## 2015-01-07 NOTE — Progress Notes (Signed)
Patient expired at approximately 2024 with family at bedside. RN Wilkie Aye was the second nurse who verified patient's death. Body was picked up on unit by [employer] [facility] at approximately 2309.

## 2015-01-07 DEATH — deceased

## 2016-10-26 IMAGING — CT CT ABDOMEN W/ CM
3 of 6 series · 12 of 46 positions shown, 17 images · IV contrast (omnipaque)
Comparison: CT scan 04/25/2011

CLINICAL DATA: Elevated liver function studies.

EXAM:
CT ABDOMEN WITH CONTRAST
TECHNIQUE: Multidetector CT imaging of the abdomen was performed using the
standard protocol following bolus administration of intravenous
contrast.
CONTRAST:  80mL OMNIPAQUE IOHEXOL 300 MG/ML  SOLN

[Series 2: rtn a/p with · axial · 0.74mm/px · z∈[-262,-38]mm · 8 of 59 slices shown, 13 images]
[im 7/59  soft-tissue]
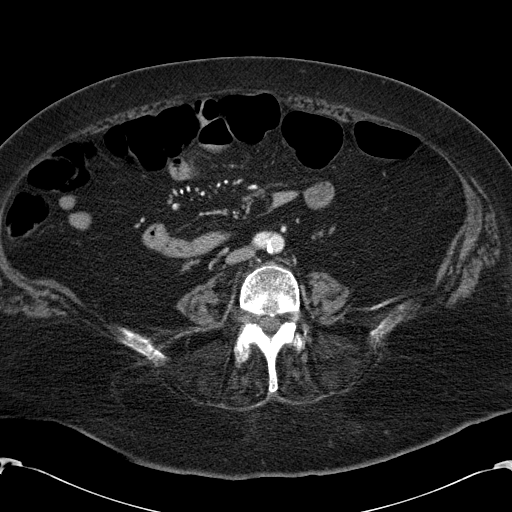
[im 7/59  bone]
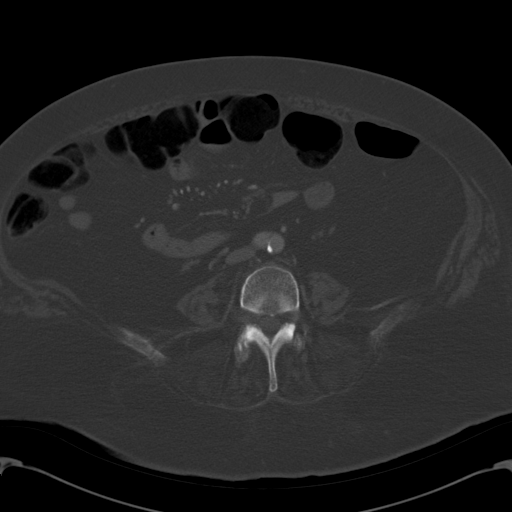
[im 13/59  soft-tissue]
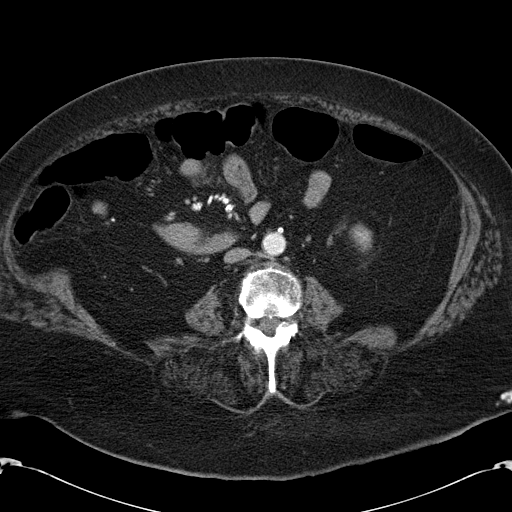
[im 20/59  soft-tissue]
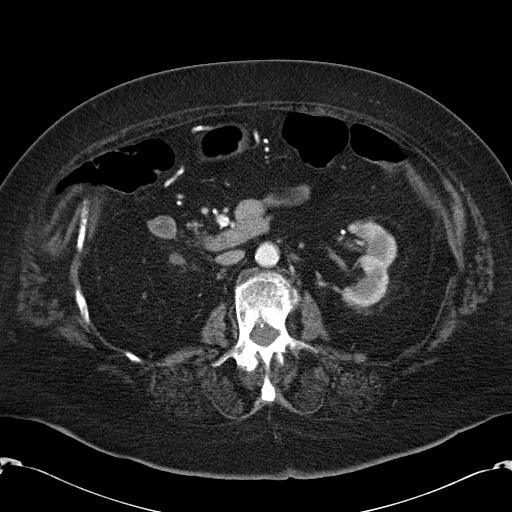
[im 26/59  soft-tissue]
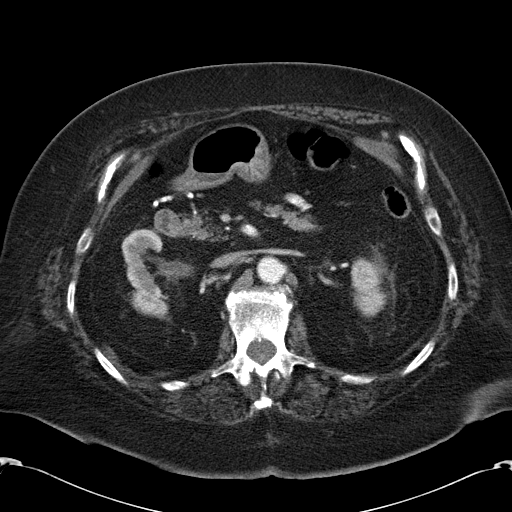
[im 33/59  soft-tissue]
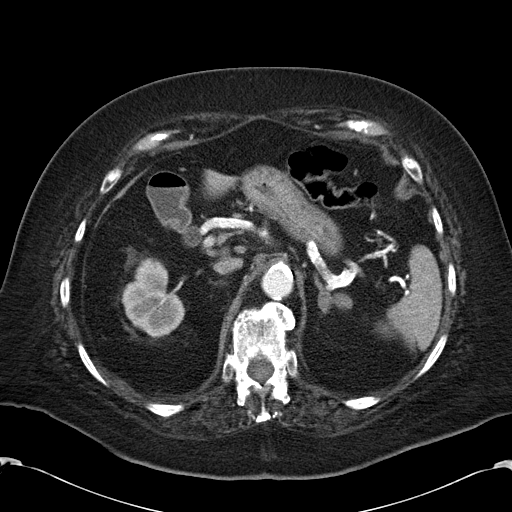
[im 33/59  lung]
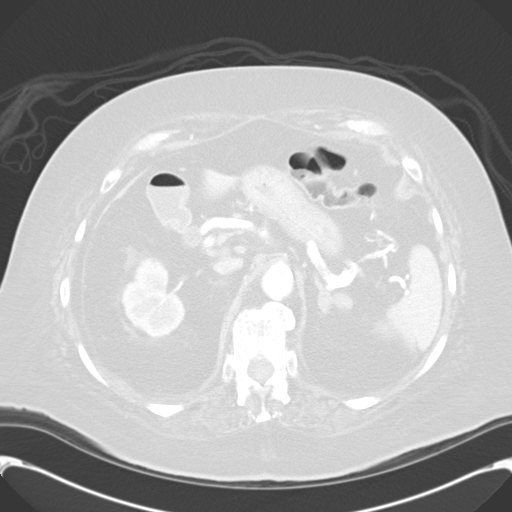
[im 39/59  soft-tissue]
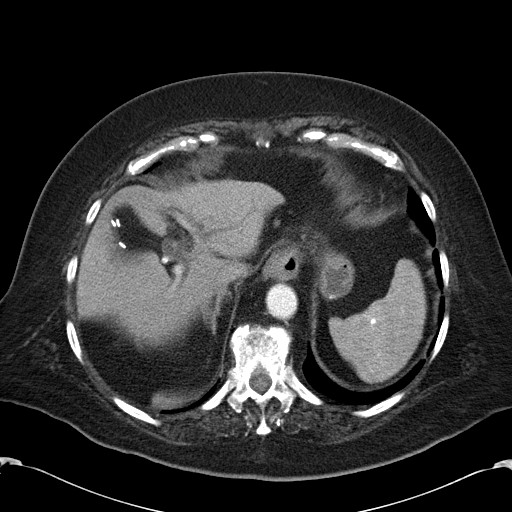
[im 39/59  lung]
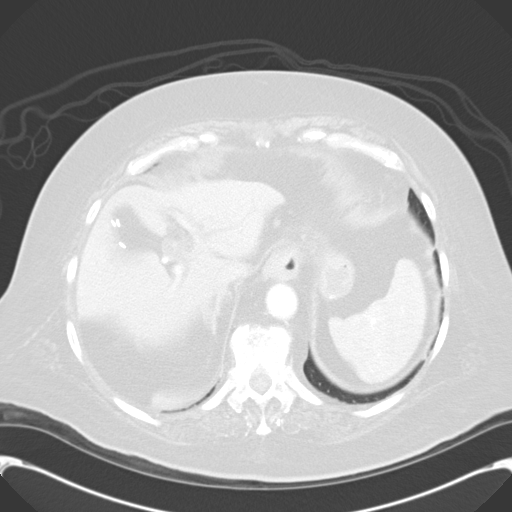
[im 46/59  soft-tissue]
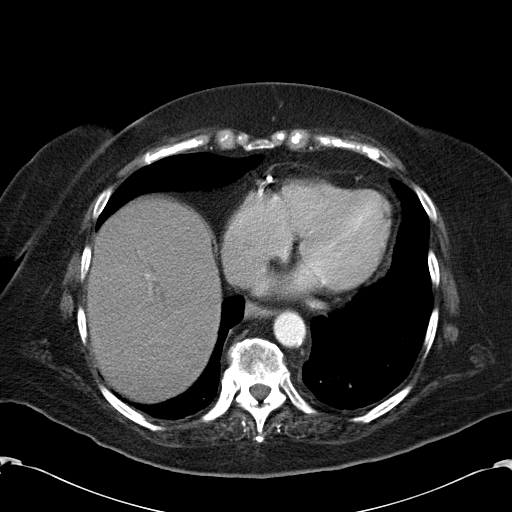
[im 46/59  lung]
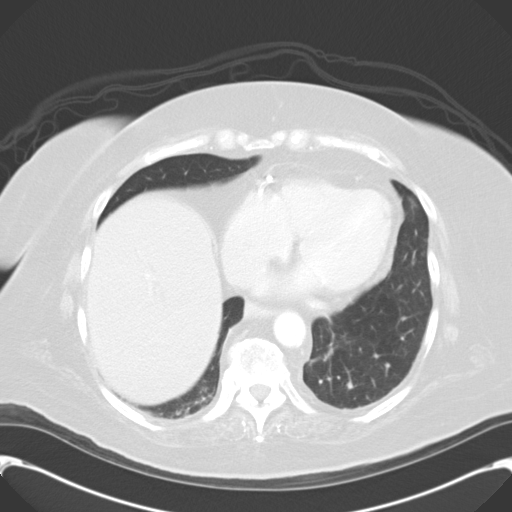
[im 52/59  soft-tissue]
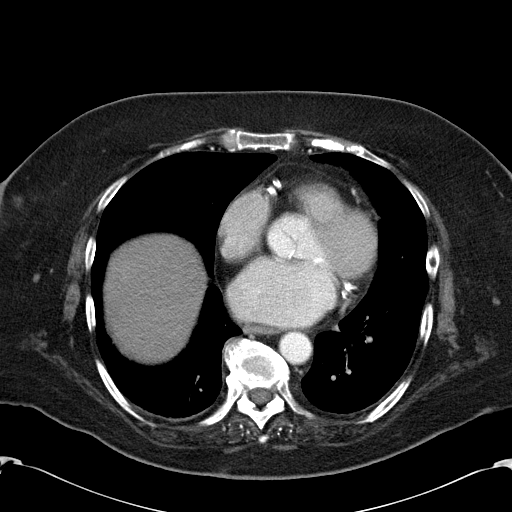
[im 52/59  lung]
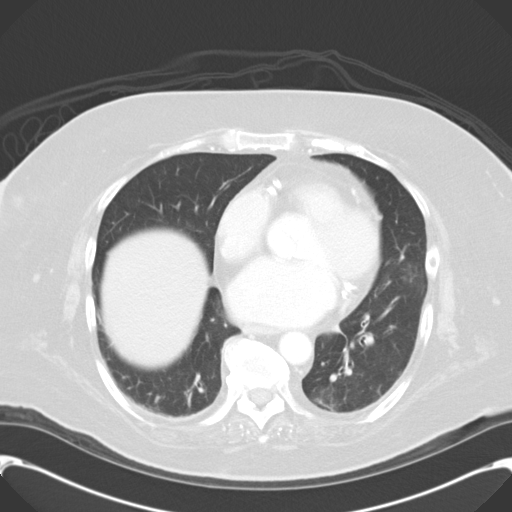

[Series 602: <mpr thick range> · coronal · 0.84mm/px · 3 of 97 slices shown]
[im 33/97  soft-tissue]
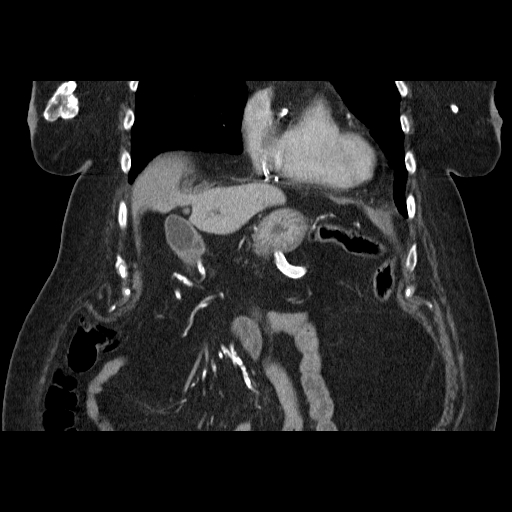
[im 43/97  soft-tissue]
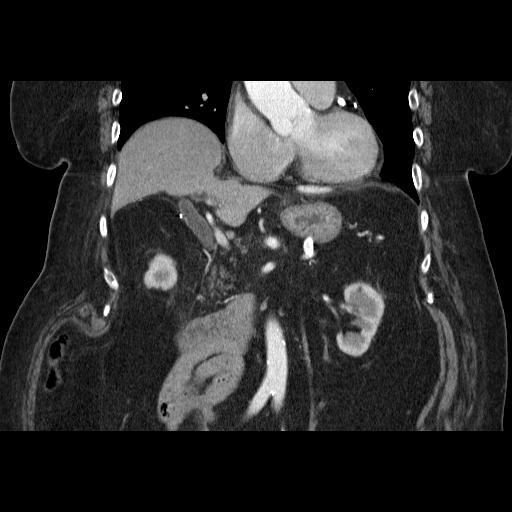
[im 54/97  soft-tissue]
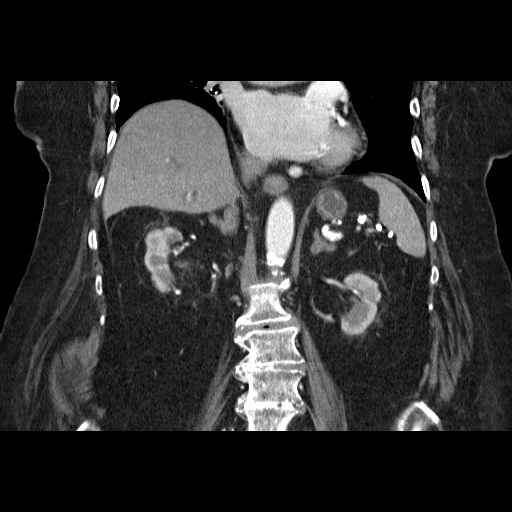

[Series 603: <mpr thick range(1)> · sagittal · 0.84mm/px · 1 of 129 slices shown]
[im 43/129  soft-tissue]
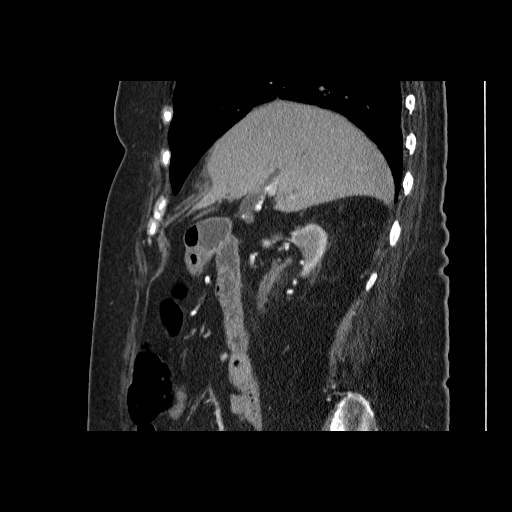

[12 of 46 positions shown; findings below may reference images not displayed]

FINDINGS: Lower chest: The lung bases are clear of acute process. There are
basilar scarring changes and dependent atelectasis. The heart is
normal in size. No pericardial effusion. Three-vessel coronary
artery calcifications are noted. The distal esophagus is grossly
normal. There is a small hiatal hernia. Dense bilateral breast
calcifications are noted.

Hepatobiliary: No focal hepatic lesions are identified. There is
mild intra and extrahepatic biliary dilatation likely due to the
patient's age and prior cholecystectomy.

Pancreas: Mild atrophy but no mass, inflammation or ductal
dilatation.

Spleen: Normal size.  No focal lesions.

Adrenals/Urinary Tract: Stable bilateral adrenal gland nodules
consistent with benign adenomas.

The kidneys demonstrate renal cortical thinning in scarring but no
mass or hydronephrosis.

Stomach/Bowel: The stomach, duodenum, visualized small bowel and
visualize colon are unremarkable. No inflammatory changes, mass
lesions or obstructive findings.

Vascular/Lymphatic: No mesenteric or retroperitoneal mass or
adenopathy. Small scattered lymph nodes are noted.

Other: No abdominal wall hernia, subcutaneous lesions or ascites.

Musculoskeletal: No significant bony findings. Osteoporosis and
degenerative changes are noted.
IMPRESSION: 1. Mild intra and extrahepatic biliary dilatation likely due to the
patient's age and prior cholecystectomy. No obvious obstructing
pancreatic head mass, common bile duct stone or ampullary lesion.
This appears stable when compared to prior CTs from 0541.
2. Renal scarring changes.  Stable renal artery calcifications.
3. Advanced atherosclerotic calcifications involving the coronary
arteries, aorta and branch vessels.

## 2016-10-28 IMAGING — CR DG CHEST 2V
2 series · 2 of 2 positions shown · non-contrast
Comparison: 05/16/2011

CLINICAL DATA: Altered mental status and elevated ammonia and LFTs

EXAM:
CHEST - 2 VIEW

[w chest lat]
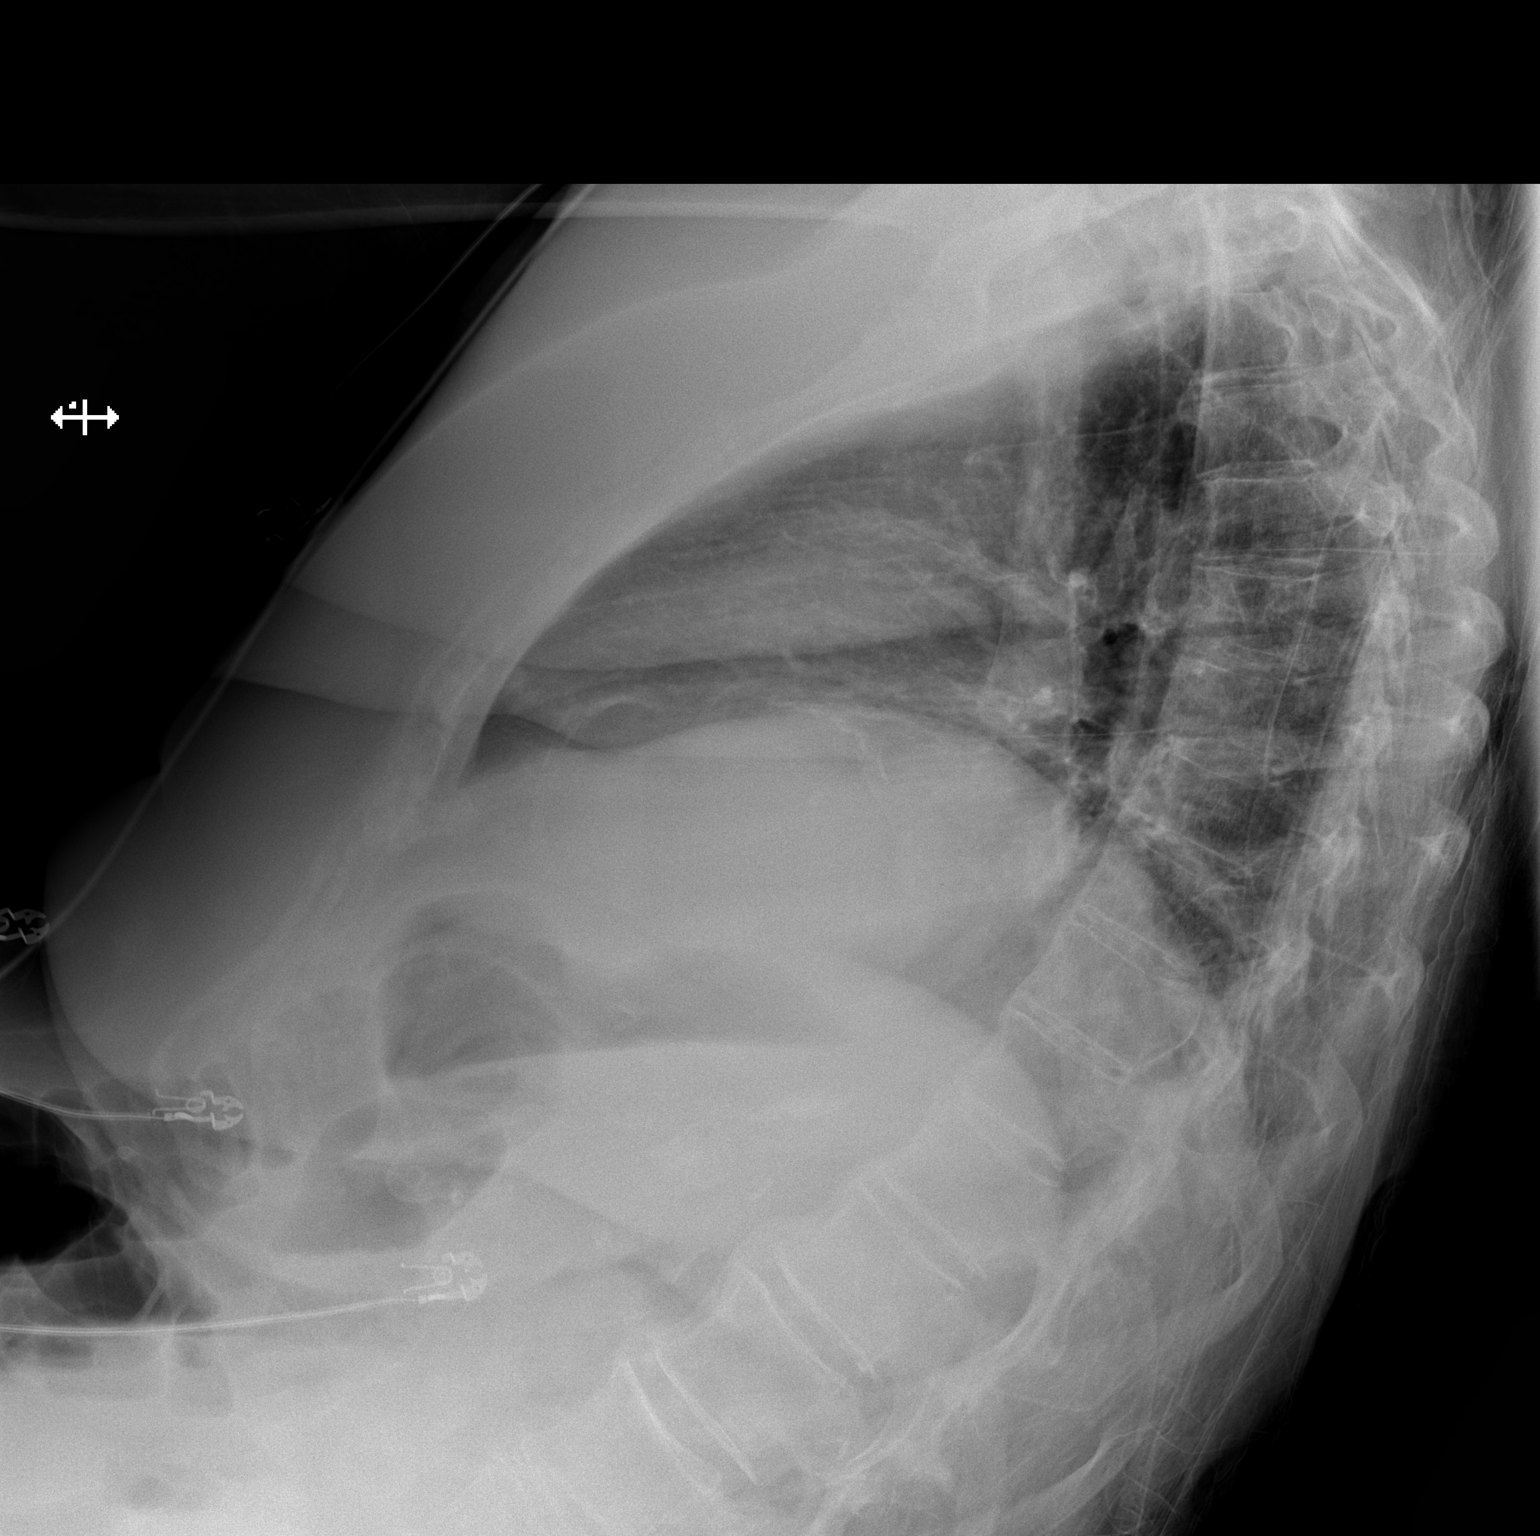

[x chest ap]
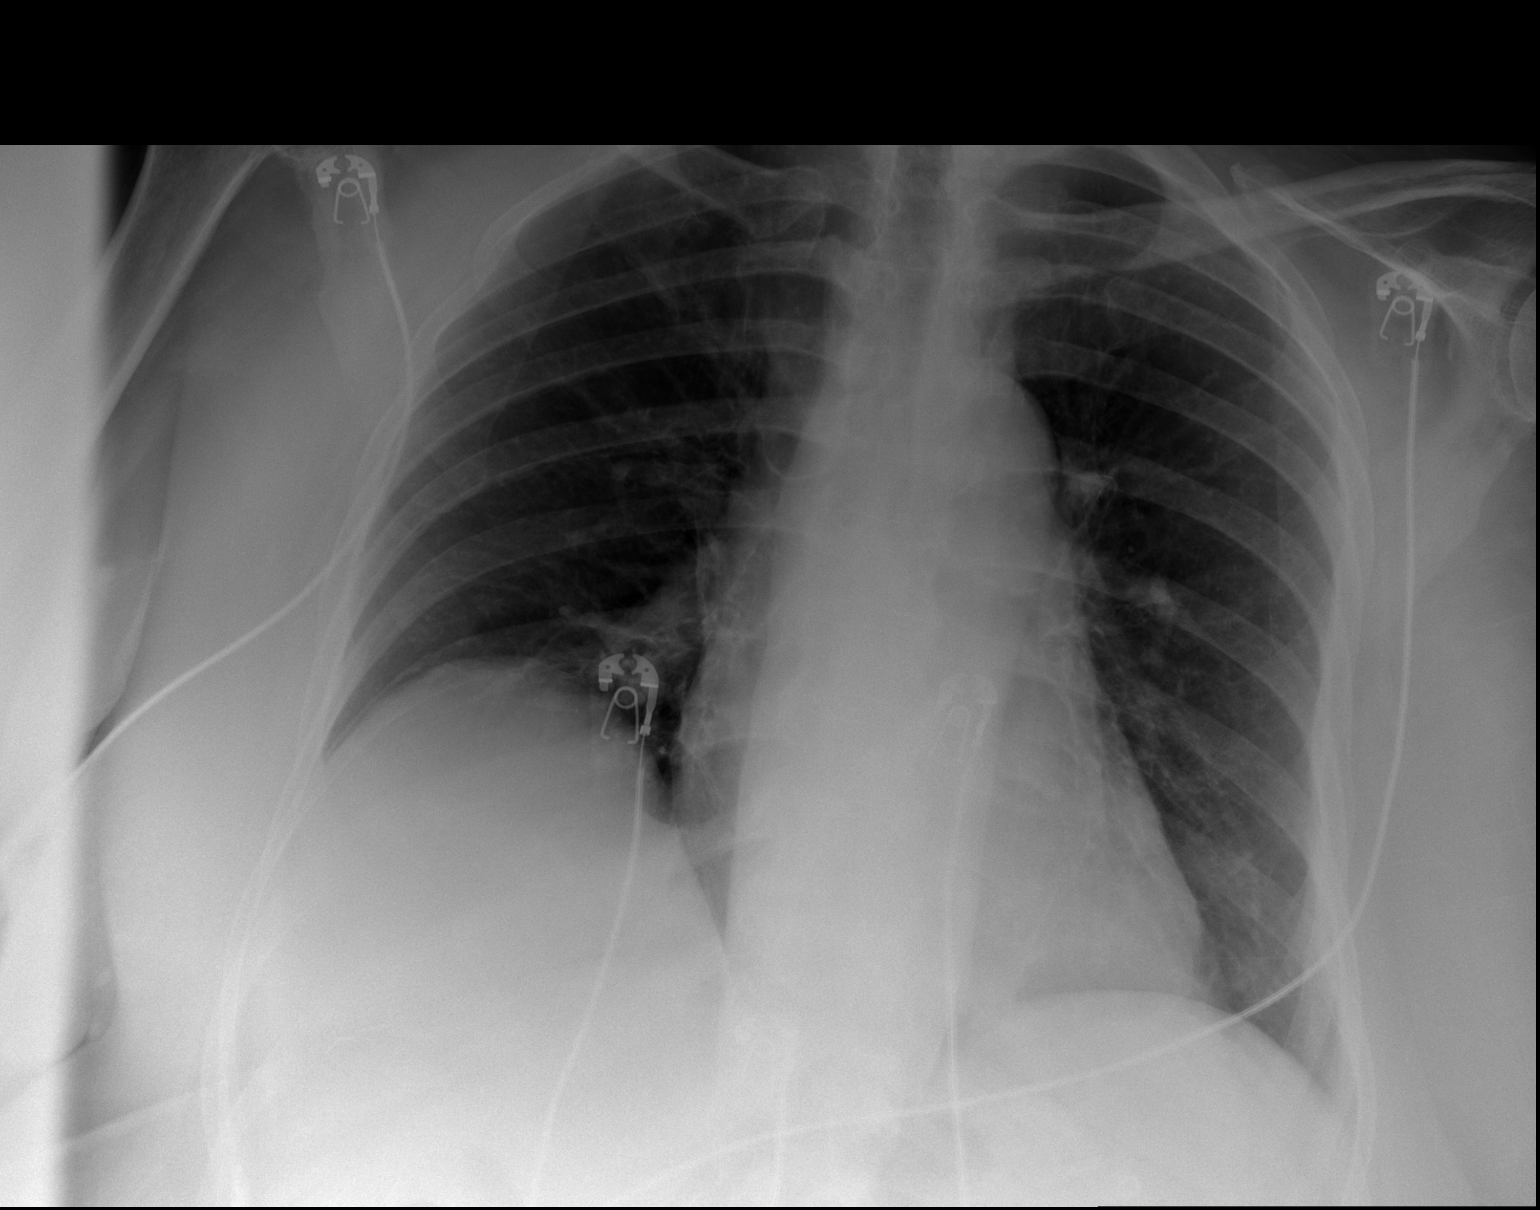

[2 of 2 positions shown; findings below may reference images not displayed]

FINDINGS: Cardiac shadow is stable. The lungs are well aerated bilaterally.
Elevation of the right hemidiaphragm is seen. No bony abnormality is
noted.
IMPRESSION: No acute abnormality noted.
